# Patient Record
Sex: Male | Born: 1955
Health system: Southern US, Community
[De-identification: ages and names within clinical notes are randomized; demographics above are authoritative.]

## PROBLEM LIST (undated history)

## (undated) DIAGNOSIS — R7303 Prediabetes: Secondary | ICD-10-CM

## (undated) DIAGNOSIS — N210 Calculus in bladder: Secondary | ICD-10-CM

## (undated) DIAGNOSIS — E7800 Pure hypercholesterolemia, unspecified: Secondary | ICD-10-CM

## (undated) DIAGNOSIS — C801 Malignant (primary) neoplasm, unspecified: Secondary | ICD-10-CM

## (undated) DIAGNOSIS — F99 Mental disorder, not otherwise specified: Secondary | ICD-10-CM

## (undated) DIAGNOSIS — R32 Unspecified urinary incontinence: Secondary | ICD-10-CM

## (undated) DIAGNOSIS — F329 Major depressive disorder, single episode, unspecified: Secondary | ICD-10-CM

## (undated) DIAGNOSIS — F32A Depression, unspecified: Secondary | ICD-10-CM

## (undated) DIAGNOSIS — Z720 Tobacco use: Secondary | ICD-10-CM

## (undated) DIAGNOSIS — R0602 Shortness of breath: Secondary | ICD-10-CM

## (undated) DIAGNOSIS — I10 Essential (primary) hypertension: Secondary | ICD-10-CM

## (undated) DIAGNOSIS — F419 Anxiety disorder, unspecified: Secondary | ICD-10-CM

## (undated) DIAGNOSIS — J449 Chronic obstructive pulmonary disease, unspecified: Secondary | ICD-10-CM

## (undated) DIAGNOSIS — Z87442 Personal history of urinary calculi: Secondary | ICD-10-CM

## (undated) DIAGNOSIS — M1990 Unspecified osteoarthritis, unspecified site: Secondary | ICD-10-CM

## (undated) DIAGNOSIS — G4730 Sleep apnea, unspecified: Secondary | ICD-10-CM

## (undated) DIAGNOSIS — K219 Gastro-esophageal reflux disease without esophagitis: Secondary | ICD-10-CM

## (undated) DIAGNOSIS — D649 Anemia, unspecified: Secondary | ICD-10-CM

## (undated) DIAGNOSIS — R0989 Other specified symptoms and signs involving the circulatory and respiratory systems: Secondary | ICD-10-CM

## (undated) HISTORY — DX: Tobacco use: Z72.0

## (undated) HISTORY — PX: COLONOSCOPY: SHX174

## (undated) HISTORY — PX: BLADDER SUSPENSION: SHX72

## (undated) HISTORY — PX: CERVICAL FUSION: SHX112

## (undated) HISTORY — DX: Other specified symptoms and signs involving the circulatory and respiratory systems: R09.89

## (undated) HISTORY — DX: Chronic obstructive pulmonary disease, unspecified: J44.9

## (undated) HISTORY — PX: CARDIAC CATHETERIZATION: SHX172

## (undated) HISTORY — PX: WRIST SURGERY: SHX841

## (undated) HISTORY — PX: BACK SURGERY: SHX140

## (undated) HISTORY — PX: PROSTATE SURGERY: SHX751

---

## 2005-08-25 DIAGNOSIS — C801 Malignant (primary) neoplasm, unspecified: Secondary | ICD-10-CM

## 2005-08-25 HISTORY — DX: Malignant (primary) neoplasm, unspecified: C80.1

## 2005-09-29 ENCOUNTER — Ambulatory Visit (HOSPITAL_COMMUNITY): Admission: RE | Admit: 2005-09-29 | Discharge: 2005-09-29 | Payer: Self-pay | Admitting: General Surgery

## 2005-10-13 ENCOUNTER — Ambulatory Visit (HOSPITAL_COMMUNITY): Admission: RE | Admit: 2005-10-13 | Discharge: 2005-10-13 | Payer: Self-pay | Admitting: Urology

## 2005-10-29 ENCOUNTER — Encounter (HOSPITAL_COMMUNITY): Admission: RE | Admit: 2005-10-29 | Discharge: 2006-01-27 | Payer: Self-pay | Admitting: Urology

## 2005-12-17 ENCOUNTER — Encounter (INDEPENDENT_AMBULATORY_CARE_PROVIDER_SITE_OTHER): Payer: Self-pay | Admitting: Specialist

## 2005-12-18 ENCOUNTER — Inpatient Hospital Stay (HOSPITAL_COMMUNITY): Admission: RE | Admit: 2005-12-18 | Discharge: 2005-12-19 | Payer: Self-pay | Admitting: Urology

## 2006-01-09 ENCOUNTER — Ambulatory Visit (HOSPITAL_COMMUNITY): Admission: RE | Admit: 2006-01-09 | Discharge: 2006-01-09 | Payer: Self-pay | Admitting: Pulmonary Disease

## 2007-09-27 ENCOUNTER — Encounter (HOSPITAL_COMMUNITY): Admission: RE | Admit: 2007-09-27 | Discharge: 2007-10-27 | Payer: Self-pay | Admitting: Family Medicine

## 2007-12-01 ENCOUNTER — Ambulatory Visit (HOSPITAL_COMMUNITY): Admission: RE | Admit: 2007-12-01 | Discharge: 2007-12-01 | Payer: Self-pay | Admitting: Urology

## 2008-05-30 ENCOUNTER — Ambulatory Visit: Admission: RE | Admit: 2008-05-30 | Discharge: 2008-08-22 | Payer: Self-pay | Admitting: Radiation Oncology

## 2008-06-21 ENCOUNTER — Encounter: Admission: RE | Admit: 2008-06-21 | Discharge: 2008-06-21 | Payer: Self-pay | Admitting: Orthopedic Surgery

## 2008-08-16 DIAGNOSIS — R0989 Other specified symptoms and signs involving the circulatory and respiratory systems: Secondary | ICD-10-CM

## 2008-08-16 HISTORY — DX: Other specified symptoms and signs involving the circulatory and respiratory systems: R09.89

## 2008-08-16 HISTORY — PX: CARDIOVASCULAR STRESS TEST: SHX262

## 2008-08-16 HISTORY — PX: TRANSTHORACIC ECHOCARDIOGRAM: SHX275

## 2008-09-14 ENCOUNTER — Inpatient Hospital Stay (HOSPITAL_COMMUNITY): Admission: RE | Admit: 2008-09-14 | Discharge: 2008-09-15 | Payer: Self-pay | Admitting: Orthopedic Surgery

## 2008-10-25 ENCOUNTER — Ambulatory Visit: Admission: RE | Admit: 2008-10-25 | Discharge: 2008-12-22 | Payer: Self-pay | Admitting: Radiation Oncology

## 2008-12-19 ENCOUNTER — Ambulatory Visit (HOSPITAL_COMMUNITY): Admission: RE | Admit: 2008-12-19 | Discharge: 2008-12-19 | Payer: Self-pay | Admitting: Family Medicine

## 2008-12-25 ENCOUNTER — Ambulatory Visit: Admission: RE | Admit: 2008-12-25 | Discharge: 2009-01-16 | Payer: Self-pay | Admitting: Radiation Oncology

## 2009-02-22 ENCOUNTER — Encounter (HOSPITAL_COMMUNITY): Admission: RE | Admit: 2009-02-22 | Discharge: 2009-05-23 | Payer: Self-pay | Admitting: Radiation Oncology

## 2009-10-30 ENCOUNTER — Encounter (INDEPENDENT_AMBULATORY_CARE_PROVIDER_SITE_OTHER): Payer: Self-pay | Admitting: Urology

## 2009-10-30 ENCOUNTER — Ambulatory Visit (HOSPITAL_COMMUNITY): Admission: RE | Admit: 2009-10-30 | Discharge: 2009-10-31 | Payer: Self-pay | Admitting: Urology

## 2009-11-17 ENCOUNTER — Emergency Department (HOSPITAL_COMMUNITY): Admission: EM | Admit: 2009-11-17 | Discharge: 2009-11-17 | Payer: Self-pay | Admitting: Emergency Medicine

## 2009-11-20 ENCOUNTER — Inpatient Hospital Stay (HOSPITAL_COMMUNITY): Admission: RE | Admit: 2009-11-20 | Discharge: 2009-11-22 | Payer: Self-pay | Admitting: Urology

## 2010-01-17 ENCOUNTER — Inpatient Hospital Stay (HOSPITAL_COMMUNITY): Admission: RE | Admit: 2010-01-17 | Discharge: 2010-01-18 | Payer: Self-pay | Admitting: Orthopedic Surgery

## 2010-07-12 ENCOUNTER — Ambulatory Visit (HOSPITAL_COMMUNITY): Admission: RE | Admit: 2010-07-12 | Discharge: 2010-07-12 | Payer: Self-pay | Admitting: Urology

## 2010-09-15 ENCOUNTER — Encounter: Payer: Self-pay | Admitting: Urology

## 2010-11-05 LAB — CBC
HCT: 40.1 % (ref 39.0–52.0)
Hemoglobin: 13.8 g/dL (ref 13.0–17.0)
RBC: 4.1 MIL/uL — ABNORMAL LOW (ref 4.22–5.81)
WBC: 7.7 10*3/uL (ref 4.0–10.5)

## 2010-11-05 LAB — SURGICAL PCR SCREEN
MRSA, PCR: NEGATIVE
Staphylococcus aureus: NEGATIVE

## 2010-11-05 LAB — BASIC METABOLIC PANEL
GFR calc non Af Amer: >60 mL/min (ref >60)
Potassium: 4.7 mEq/L (ref 3.5–5.1)
Sodium: 141 mEq/L (ref 135–145)

## 2010-11-11 LAB — BASIC METABOLIC PANEL
CO2: 24 mEq/L (ref 19–32)
GFR calc non Af Amer: >60 mL/min (ref >60)
Glucose, Bld: 94 mg/dL (ref 70–99)
Potassium: 3.9 mEq/L (ref 3.5–5.1)
Sodium: 136 mEq/L (ref 135–145)

## 2010-11-11 LAB — CBC
HCT: 39.6 % (ref 39.0–52.0)
Hemoglobin: 13.7 g/dL (ref 13.0–17.0)
MCHC: 34.5 g/dL (ref 30.0–36.0)
RDW: 13.6 % (ref 11.5–15.5)

## 2010-11-17 LAB — BASIC METABOLIC PANEL
BUN: 12 mg/dL (ref 6–23)
BUN: 9 mg/dL (ref 6–23)
CO2: 26 mEq/L (ref 19–32)
CO2: 27 mEq/L (ref 19–32)
Calcium: 8.7 mg/dL (ref 8.4–10.5)
Calcium: 9.1 mg/dL (ref 8.4–10.5)
Chloride: 107 mEq/L (ref 96–112)
Chloride: 109 mEq/L (ref 96–112)
Creatinine, Ser: 0.92 mg/dL (ref 0.4–1.5)
GFR calc Af Amer: >60 mL/min (ref >60)
GFR calc Af Amer: >60 mL/min (ref >60)
GFR calc Af Amer: >60 mL/min (ref >60)
GFR calc non Af Amer: >60 mL/min (ref >60)
GFR calc non Af Amer: >60 mL/min (ref >60)
GFR calc non Af Amer: >60 mL/min (ref >60)
GFR calc non Af Amer: >60 mL/min (ref >60)
Glucose, Bld: 120 mg/dL — ABNORMAL HIGH (ref 70–99)
Glucose, Bld: 134 mg/dL — ABNORMAL HIGH (ref 70–99)
Glucose, Bld: 145 mg/dL — ABNORMAL HIGH (ref 70–99)
Glucose, Bld: 149 mg/dL — ABNORMAL HIGH (ref 70–99)
Potassium: 3.6 mEq/L (ref 3.5–5.1)
Potassium: 3.8 mEq/L (ref 3.5–5.1)
Potassium: 4 mEq/L (ref 3.5–5.1)
Potassium: 4.3 mEq/L (ref 3.5–5.1)
Sodium: 136 mEq/L (ref 135–145)
Sodium: 140 mEq/L (ref 135–145)
Sodium: 141 mEq/L (ref 135–145)
Sodium: 141 mEq/L (ref 135–145)

## 2010-11-17 LAB — CBC
HCT: 35.8 % — ABNORMAL LOW (ref 39.0–52.0)
HCT: 37.2 % — ABNORMAL LOW (ref 39.0–52.0)
HCT: 37.6 % — ABNORMAL LOW (ref 39.0–52.0)
HCT: 37.7 % — ABNORMAL LOW (ref 39.0–52.0)
HCT: 39.7 % (ref 39.0–52.0)
Hemoglobin: 11.7 g/dL — ABNORMAL LOW (ref 13.0–17.0)
Hemoglobin: 12.4 g/dL — ABNORMAL LOW (ref 13.0–17.0)
MCHC: 32.7 g/dL (ref 30.0–36.0)
MCHC: 32.8 g/dL (ref 30.0–36.0)
MCV: 99 fL (ref 78.0–100.0)
MCV: 99.1 fL (ref 78.0–100.0)
Platelets: 220 10*3/uL (ref 150–400)
Platelets: 230 10*3/uL (ref 150–400)
Platelets: 280 10*3/uL (ref 150–400)
Platelets: 281 10*3/uL (ref 150–400)
RBC: 3.81 MIL/uL — ABNORMAL LOW (ref 4.22–5.81)
RDW: 12.4 % (ref 11.5–15.5)
RDW: 12.6 % (ref 11.5–15.5)
RDW: 13 % (ref 11.5–15.5)
RDW: 13.2 % (ref 11.5–15.5)
WBC: 7.7 10*3/uL (ref 4.0–10.5)

## 2010-11-17 LAB — GENTAMICIN LEVEL, RANDOM: Gentamicin Rm: 3.7 ug/mL

## 2010-11-17 LAB — DIFFERENTIAL
Basophils Absolute: 0 10*3/uL (ref 0.0–0.1)
Basophils Absolute: 0 10*3/uL (ref 0.0–0.1)
Basophils Relative: 0 % (ref 0–1)
Eosinophils Absolute: 0.1 10*3/uL (ref 0.0–0.7)
Eosinophils Absolute: 0.5 10*3/uL (ref 0.0–0.7)
Eosinophils Relative: 1 % (ref 0–5)
Eosinophils Relative: 6 % — ABNORMAL HIGH (ref 0–5)
Lymphocytes Relative: 20 % (ref 12–46)
Lymphocytes Relative: 3 % — ABNORMAL LOW (ref 12–46)
Lymphs Abs: 1.5 10*3/uL (ref 0.7–4.0)
Neutrophils Relative %: 64 % (ref 43–77)

## 2010-11-17 LAB — PROTIME-INR: Prothrombin Time: 13.1 seconds (ref 11.6–15.2)

## 2010-11-17 LAB — TYPE AND SCREEN: Antibody Screen: NEGATIVE

## 2010-11-17 LAB — URINALYSIS, ROUTINE W REFLEX MICROSCOPIC
Nitrite: NEGATIVE
Specific Gravity, Urine: 1.02 (ref 1.005–1.030)
pH: 5.5 (ref 5.0–8.0)

## 2010-11-17 LAB — CULTURE, BLOOD (ROUTINE X 2)
Report Status: 3312011
Report Status: 3312011

## 2010-11-17 LAB — GRAM STAIN

## 2010-11-17 LAB — CULTURE, ROUTINE-ABSCESS

## 2010-11-17 LAB — URINE CULTURE: Colony Count: 65000

## 2010-11-17 LAB — ANAEROBIC CULTURE

## 2010-11-20 ENCOUNTER — Ambulatory Visit (HOSPITAL_COMMUNITY)
Admission: RE | Admit: 2010-11-20 | Discharge: 2010-11-20 | Disposition: A | Discharge status: Home / Self Care | Payer: PRIVATE HEALTH INSURANCE | Source: Ambulatory Visit | Attending: Cardiology | Admitting: Cardiology

## 2010-11-20 ENCOUNTER — Other Ambulatory Visit (HOSPITAL_COMMUNITY): Payer: Self-pay | Admitting: Cardiology

## 2010-11-20 DIAGNOSIS — Z01811 Encounter for preprocedural respiratory examination: Secondary | ICD-10-CM

## 2010-11-20 DIAGNOSIS — Z01818 Encounter for other preprocedural examination: Secondary | ICD-10-CM | POA: Insufficient documentation

## 2010-11-26 ENCOUNTER — Ambulatory Visit (HOSPITAL_COMMUNITY)
Admission: RE | Admit: 2010-11-26 | Discharge: 2010-11-26 | Disposition: A | Discharge status: Home / Self Care | Payer: PRIVATE HEALTH INSURANCE | Source: Ambulatory Visit | Attending: Cardiology | Admitting: Cardiology

## 2010-11-26 DIAGNOSIS — R0602 Shortness of breath: Secondary | ICD-10-CM | POA: Insufficient documentation

## 2010-11-26 DIAGNOSIS — E785 Hyperlipidemia, unspecified: Secondary | ICD-10-CM | POA: Insufficient documentation

## 2010-11-26 DIAGNOSIS — F17200 Nicotine dependence, unspecified, uncomplicated: Secondary | ICD-10-CM | POA: Insufficient documentation

## 2010-11-26 LAB — POCT I-STAT 3, VENOUS BLOOD GAS (G3P V)
Bicarbonate: 22.4 mEq/L (ref 20.0–24.0)
TCO2: 24 mmol/L (ref 0–100)
pCO2, Ven: 46.2 mmHg (ref 45.0–50.0)
pH, Ven: 7.303 — ABNORMAL HIGH (ref 7.250–7.300)
pH, Ven: 7.305 — ABNORMAL HIGH (ref 7.250–7.300)
pO2, Ven: 35 mmHg (ref 30.0–45.0)

## 2010-11-26 LAB — POCT I-STAT, CHEM 8
BUN: 15 mg/dL (ref 6–23)
Chloride: 106 mEq/L (ref 96–112)
Creatinine, Ser: 0.9 mg/dL (ref 0.4–1.5)
Glucose, Bld: 97 mg/dL (ref 70–99)
Potassium: 3.7 mEq/L (ref 3.5–5.1)

## 2010-11-26 LAB — POCT I-STAT 3, ART BLOOD GAS (G3+)
Acid-base deficit: 3 mmol/L — ABNORMAL HIGH (ref 0.0–2.0)
Bicarbonate: 22.6 mEq/L (ref 20.0–24.0)
O2 Saturation: 95 %
TCO2: 24 mmol/L (ref 0–100)
pO2, Arterial: 79 mmHg — ABNORMAL LOW (ref 80.0–100.0)

## 2010-12-09 LAB — TYPE AND SCREEN: ABO/RH(D): A POS

## 2010-12-09 LAB — CBC
HCT: 44.3 % (ref 39.0–52.0)
Platelets: 244 10*3/uL (ref 150–400)
RDW: 12.3 % (ref 11.5–15.5)

## 2010-12-09 LAB — POCT I-STAT 4, (NA,K, GLUC, HGB,HCT)
Glucose, Bld: 127 mg/dL — ABNORMAL HIGH (ref 70–99)
HCT: 36 % — ABNORMAL LOW (ref 39.0–52.0)
Hemoglobin: 12.2 g/dL — ABNORMAL LOW (ref 13.0–17.0)
Potassium: 4 mEq/L (ref 3.5–5.1)

## 2010-12-09 LAB — DIFFERENTIAL
Basophils Absolute: 0.1 10*3/uL (ref 0.0–0.1)
Eosinophils Absolute: 1 10*3/uL — ABNORMAL HIGH (ref 0.0–0.7)
Eosinophils Relative: 9 % — ABNORMAL HIGH (ref 0–5)
Lymphocytes Relative: 35 % (ref 12–46)

## 2010-12-09 LAB — BASIC METABOLIC PANEL WITH GFR
BUN: 11 mg/dL (ref 6–23)
CO2: 28 meq/L (ref 19–32)
Calcium: 10.3 mg/dL (ref 8.4–10.5)
Chloride: 102 meq/L (ref 96–112)
Creatinine, Ser: 0.81 mg/dL (ref 0.4–1.5)
GFR calc non Af Amer: >60 mL/min
Glucose, Bld: 93 mg/dL (ref 70–99)
Potassium: 4.6 meq/L (ref 3.5–5.1)
Sodium: 142 meq/L (ref 135–145)

## 2010-12-09 LAB — ABO/RH: ABO/RH(D): A POS

## 2010-12-16 NOTE — Consult Note (Signed)
NAMEAMARION, [name] NO.:  [id number]  MEDICAL RECORD NO.:  [id number]           PATIENT TYPE:  O  LOCATION:  [facility]                         FACILITY:  [facility]  PHYSICIAN:  Ritta Slot, MD     DATE OF BIRTH:  1955/08/31  DATE OF CONSULTATION:  11/26/2010 DATE OF DISCHARGE:  11/26/2010                                CONSULTATION   PATIENT PROFILE:  Mr. [name] is a very pleasant 55 year old Caucasian gentleman with a history of shortness of breath and a 40-pack-history of smoking with mild dyslipidemia.  He had a negative nuclear perfusion stress test, but was still complaining of shortness of breath.  We decided to go ahead and proceed with a right and left heart catheterization to exclude coronary artery disease and pulmonary hypertension as a cause the symptoms.  After informed consent, he was placed in supine position in the second floor of Agency cardiac catheterization laboratory.  His right groin was prepped and draped in usual sterile fashion.  He received 3 mg of Versed, 75 mcg of fentanyl, 20 mL of 1% lidocaine local anesthetic.  We placed a 5-French sheath in the RFA and a 7-French sheath in the RFV using the modified Seldinger technique.  Through the 7-French, we was placed a Swan-Ganz catheter that was placed into PA without difficulty.  Cardiac outputs were obtained by thermodilution as well as Fick principal measurement.  An angled pigtail catheter was then placed into the LV through the 5-French sheath without difficulty.  Simultaneous LV wedge pressures LVPA, LVRA, and isolated RA pressures were obtained.  Left ventriculography was then performed using 36 mL of contrast at 12 mL per second in the RAO oblique view.  Pressure across the aortic valve was then obtained using pullback technique.  The angled pigtail catheter was then exchanged over a regular J-wire for an JL-4 catheter.  This was placed in the left main without difficulty.   Visualization of the left circulation system was obtained with AP cranial, LAO cranial, RA cranial, AP caudal views.  The JL-4 catheter was then exchanged over J-wire for the JR-4 JR-4 catheter. The JR-4 catheter was placed in the RCA without difficulty. Visualization of the RCA was obtained in the RAO cranial, LAO cranial view.  FINDINGS: 1. RA pressure 7/7, mean of 6, RV pressure 29/0, LVEDP of 9.  PA     pressure 27/8 and mean at 7.  Wedge 12/14, mean of 10.  LV pressure     108/negative 1, LVEDP of 13.  LV pullback 106/negative 1, LVEDP of     7.  AO pressure pullback 95/53, mean of 70, AO sat 95%, PA sat 60%.     Cardiac output by thermo was 4.41, cardiac output by thermo 1.78,     cardiac output by Fick 5.35.  Cardiac index by Fick 2.15. 2. Coronary.  The right coronary artery was a nondominant vessel, did     give rise to PDA, but was small, was free of any disease.  The left     main bifurcated into the LAD and circumflex LAD and gave rise  to     diagonals that was free of any disease.  The circumflex was a very     large territory giving rise to the OM1, OM2, OM3, as well as a PDA.     There is no significant disease in the circ complex territory     artery either.  The left main was also free of any disease.  LV     function was found to be 65%.  No regional wall motion     abnormalities.  No mitral regurgitation.  FINAL CONCLUSIONS:  Normal right and left heart catheter with clean coronaries, normal LV function, and normal right heart pressures.  I think the symptoms are noncardiac or pulmonary in origin.  He will follow up with me in due course.     Ritta Slot, MD     HS/MEDQ  D:  11/26/2010  T:  11/27/2010  Job:  [id number]  Electronically Signed by Ritta Slot MD on 12/16/2010 04:04:47 PM
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
## 2011-01-07 NOTE — Op Note (Signed)
NAMEARSHAN, [name]                ACCOUNT NO.:  [id number]   MEDICAL RECORD NO.:  [id number]          PATIENT TYPE:  AMB   LOCATION:  DAY                          FACILITY:  [facility]   PHYSICIAN:  Ronald L. Earlene Plater, M.D.  DATE OF BIRTH:  08/13/1956   DATE OF PROCEDURE:  12/01/2007  DATE OF DISCHARGE:                               OPERATIVE REPORT   PREOPERATIVE DIAGNOSES:  1. Adenocarcinoma of prostate, status post robotic radical      prostatectomy.  2. Urinary incontinence.  3. Bladder neck stone.   POSTOPERATIVE DIAGNOSES:  1. Adenocarcinoma of prostate, status post robotic radical      prostatectomy.  2. Urinary incontinence.  3. Bladder neck stone.  4. Dorsal vein staple erosion resulting in stone.   PROCEDURE:  1. Cystourethroscopy.  2. Removal of bladder neck stone and eroded staples.   SURGEON:  Lucrezia Starch. Earlene Plater, M.D.   ANESTHESIA:  LMA.   BLOOD LOSS:  Negligible.   TUBES:  None.   COMPLICATIONS:  None.   PROCEDURE:  Mr. Cure is a very nice 55 year old white male who is  status post term robotic radical prostatectomy in April 2007.  He did  have positive margins.  He has developed some stress urinary  incontinence which has increased.  On cystourethroscopy he was found to  have a stone in the 8 o'clock position on the bladder neck.  After  understanding risks, benefits and alternatives, he elected to proceed  with removal.   DESCRIPTION OF PROCEDURE:  The patient was placed in supine position.  After proper LMA anesthesia, was placed in the dorsal lithotomy position  and prepped and draped with Betadine in the sterile fashion.  Cystourethroscopy with 22.5 [place] Olympus panendoscope.  The bladder  was carefully inspected and noted be without lesions and efflux of clear  urine was noted from normally placed ureteral orifices bilaterally.  At  the bladder neck area right at the urethrovesical anastomosis, there was  a  yellow calcification which was protruding in  the 8 o'clock position.  It was noted that the sphincter was totally intact and there was no  bladder neck contracture or urethral stricture.  Utilizing a 70 degrees  lens and an Aberrans bridge,  the stone was grasped and removed with a  grasping forceps.  Underlying it was a very small eroded staple that was  a staple from the dorsal vein complex that had obviously eroded and  migrated.  This was also grasped and removed and both the stone material  and the staple  were removed and will be given to him.  Reinspection revealed the  bladder neck was soft and supple.  The external sphincter was intact.  It was widely patent.  The bladder was without lesions and  ureteral  orifices were normal.  Bladder was drained.  The panendoscope was  removed and the patient was taken to the recovery room stable.      Ronald L. Earlene Plater, M.D.  Electronically Signed     RLD/MEDQ  D:  12/01/2007  T:  12/01/2007  Job:  [id number]
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
## 2011-01-10 NOTE — H&P (Signed)
NAME:  [name], [name] NO.:  [id number]   MEDICAL RECORD NO.:  [id number]           PATIENT TYPE:   LOCATION:                                 FACILITY:   PHYSICIAN:  Dalia Heading, M.D.  DATE OF BIRTH:  11-25-1955   DATE OF ADMISSION:  DATE OF DISCHARGE:  LH                                HISTORY & PHYSICAL   CHIEF COMPLAINT:  Need for screening colonoscopy.   HISTORY OF PRESENT ILLNESS:  The patient is a 55 year old white male who is  referred for endoscopic evaluation.  He needs a colonoscopy for screening  purposes.  No abdominal pain, weight loss, nausea, vomiting, diarrhea,  constipation, melena, or hematochezia have been noted.  He has never had a  colonoscopy.  There is no family history of colon carcinoma.   PAST MEDICAL HISTORY:  BPH.   PAST SURGICAL HISTORY:  Vasectomy.   CURRENT MEDICATIONS:  Flomax.   ALLERGIES:  PENICILLIN.   REVIEW OF SYSTEMS:  The patient smokes one pack of cigarettes a day.  Denies  alcohol use.  He denies any other cardiopulmonary difficulties or bleeding  disorders.   PHYSICAL EXAMINATION:  GENERAL:  The patient is a well-developed, well-  nourished, white male in no acute distress.  LUNGS:  Clear to auscultation with equal breath sounds bilaterally.  HEART:  Reveals a regular rate and rhythm without S3, S4, or murmurs.  ABDOMEN:  Soft, nontender, nondistended.  No hepatosplenomegaly or masses  are noted.  RECTAL:  Deferred to the procedure.   IMPRESSION:  Need for screening colonoscopy.   PLAN:  The patient is scheduled for a colonoscopy on September 29, 2005.  The  risks and benefits of the procedure including bleeding and perforation were  fully explained to the patient, gave informed consent.      Dalia Heading, M.D.  Electronically Signed     MAJ/MEDQ  D:  09/02/2005  T:  09/02/2005  Job:  [id number]   cc:   Dalia Heading, M.D.  Fax: [phone]   [facility]  Fax: [phone]   Patrica Duel, M.D.  Fax: [phone]

## 2011-01-10 NOTE — Op Note (Signed)
NAMEJOSAIAH, [name]                ACCOUNT NO.:  [id number]   MEDICAL RECORD NO.:  [id number]          PATIENT TYPE:  OIB   LOCATION:  1419                         FACILITY:  [facility]   PHYSICIAN:  Ronald L. Earlene Plater, M.D.  DATE OF BIRTH:  Aug 24, 1956   DATE OF PROCEDURE:  12/17/2005  DATE OF DISCHARGE:                                 OPERATIVE REPORT   PREOPERATIVE DIAGNOSIS:  Adenocarcinoma of the prostate.   POSTOPERATIVE DIAGNOSIS:  Adenocarcinoma of the prostate.   OPERATION PERFORMED:  Robotic radical prostatectomy with bilateral pelvic  lymphadenectomy and frozen sections of right pelvic lymph nodes.   SURGEON:  Lucrezia Starch. Earlene Plater, M.D.   ASSISTANT:  Crecencio Mc, M.D.   ANESTHESIA:  General endotracheal.   ESTIMATED BLOOD LOSS:  200 mL.   TUBES:  18 French Foley catheter, large round Blake drain.   COMPLICATIONS:  None.   INDICATIONS FOR PROCEDURE:  Mr. Mozer is a very nice 55 year old white male  who originally presented with an elevated PSA of 4.83 and a right prostate  nodule.  He underwent biopsy which revealed a Gleason score 7 which was 4+3  adenocarcinoma from the right side of the prostate in 60% of the tissue  biopsied and a Gleason score 6 which was 3+3 in 5% of the tissue from the  left side of the prostate.  He underwent a metastatic work-up and on CT scan  there were some shotty bilateral pelvic lymph nodes, the largest 1.1 x 2 cm  on the right side and a subsequent Prostascint scan revealed borderline  enlarged external iliac nodes with some tracer uptake on the right side.  We  discussed biopsy of the lymph nodes, diagnostic radiology and they felt like  the approach was not safe, so he elected to proceed with robotic pelvic  exploration and lymphadenopathy with frozen section from the right side of  the lymph node chain, if these were negative, to proceed with robotic  radical prostatectomy.   DESCRIPTION OF PROCEDURE:  The patient was placed in supine  position.  After  proper general endotracheal anesthesia, he was placed in the frogleg  position in exaggerated Trendelenburg and prepped and draped with Betadine  in sterile fashion.  A 22 French Foley catheter was inserted and the bladder  was drained.  A periumbilical incision was made and the 11-12 cannula was  placed and the peritoneal cavity was insufflated with carbon dioxide.  The  remainder of the ports were placed.  They were a robotic port to the right  and left, one hand breadth from the camera port and they were 8 mm ports.  Lateral to the left robotic port was another 8 mm robotic port for the third  arm and two working ports were placed at 5 mm just superior and right  lateral to the camera port and one posterior lateral 11-12 working port.  Inspection of the abdomen revealed no significant abnormalities.  He was  somewhat obese however.  Using the monopolar hook cautery in the right hand  and the [place] bipolar on the left hand, the bladder  was taken down to the  space of Retzius.  The right lymph nodes which were more problematic were  taken down and large lymph nodes were noted to be in the external iliac  chain.  These were carefully taken down to the level of the circumflex iliac  vein and both the iliac artery and iliac vein were carefully dissected and  protected.  Lymph nodes were submitted to pathology and frozen section  revealed there was no tumor.  The obturator lymph nodes were also removed  and the lymphatics clipped.  Obturator nerve was identified and protected  and frozen section performed on them were also negative.  A left pelvic  lymph node dissection was performed in a similar manner and it appeared to  be normal so no frozen section was performed.  The prostate was then  approached.  The endopelvic fascia was perforated bilaterally and the  prostate was dissected free.  Puboprostatic ligaments were partially incised  and the dorsal vein complex was  clipped with Endo GIA stapler.  The bladder  neck was then approached and sharply dissected down and the bladder neck was  preserved and the posterior area was dissected free and seminal vesicles and  ampulla vas deferens were taken down.  Their vasculature was taken down and  dissected free.  Denonvilliers's fascia was then taken posteriorly and the  proper plane anterior to the rectum was dissected free from the prostate.  The pedicles were then clipped with Hem-o-lok clips and serially cut.  The  neurovascular bundle tissue was dissected anteriorly and the veil tissue was  dissected posterolaterally and maintained intact. Even on the right side it  appeared that it was safe to preserve the nerves and there was no induration  of the nerve tissue.  This was taken to the apex carefully.  The rectum was  dissected free.  The urethra was incised anteriorly.  The catheter was  removed.  The posterior urethral plane was taken down and the specimen was  removed intact and placed in the peritoneal cavity.  The pelvis was filled  with irrigation fluid.  Air was pumped into the rectum and there was no  leakage noted.  The neurovascular bundles appeared to be intact.  There was  no remaining prostate tissue.  Good hemostasis was obtained.  The  anastomosis was then approached.  The holding stitch was placed in the 6  o'clock position with 2-0 Vicryl suture to approximate the bladder neck  mucosa to the urethra.  The anastomosis was then performed with running 3-0  Monocryl suture, dyed on the left side and undyed to the right.  Stitches  were begun posteriorly and a running anastomosis was performed bilaterally  that was felt to be watertight mucosa.  The mucosa was directly visualized.  The transition stitch performed on to the bladder neck and these were tied  down.  An 40 French coude catheter was easily passed into the bladder.  The  bladder was noted to irrigate clear and good hemostasis was  noted to be present.  A large round Blake drain was placed through the third port and  noted to be in the pelvis.  Thorough irrigation was performed.  The right 11-  12 port was closed visually with a suture passer and each port was  visualized when the cannulas were removed and there was no significant  bleeding noted.  The midline wound was then closed with a running 2-0 Vicryl  suture utilizing UR-6 needle  and the fascia was noted to be well closed.  Thorough irrigation was performed of each wound.  They were injected with  Marcaine and the skin was closed with skin staples and the wound was dressed  in a sterile fashion and the drain was sutured in place with nylon.  The  patient tolerated the procedure well.  There were no complications.  He was  taken to recovery room stable.      Ronald L. Earlene Plater, M.D.  Electronically Signed     RLD/MEDQ  D:  12/17/2005  T:  12/18/2005  Job:  [id number]

## 2011-01-10 NOTE — Procedures (Signed)
[name], [name]                ACCOUNT NO.:  [id number]   MEDICAL RECORD NO.:  [id number]          PATIENT TYPE:  OUT   LOCATION:                                FACILITY:  [facility]   PHYSICIAN:  Edward L. Juanetta Gosling, M.D.DATE OF BIRTH:  [birth year]   DATE OF PROCEDURE:  DATE OF DISCHARGE:                              PULMONARY FUNCTION TEST   1.  Spirometry shows no definite ventilatory defect, but evidence of airflow      obstruction most marked in the smaller airways.  2.  Lung volumes are normal.  3.  DLCO is normal.  4.  Arterial blood gases are normal.  5.  There is no significant bronchodilator effect.      Edward L. Juanetta Gosling, M.D.  Electronically Signed     ELH/MEDQ  D:  01/14/2006  T:  01/14/2006  Job:  [id number]

## 2011-05-20 LAB — BASIC METABOLIC PANEL
BUN: 8
CO2: 26
Chloride: 107
Glucose, Bld: 107 — ABNORMAL HIGH
Potassium: 3.8

## 2011-09-26 ENCOUNTER — Other Ambulatory Visit: Payer: Self-pay | Admitting: Orthopedic Surgery

## 2011-09-26 DIAGNOSIS — M549 Dorsalgia, unspecified: Secondary | ICD-10-CM

## 2011-09-30 ENCOUNTER — Ambulatory Visit
Admission: RE | Admit: 2011-09-30 | Discharge: 2011-09-30 | Disposition: A | Discharge status: Home / Self Care | Payer: PRIVATE HEALTH INSURANCE | Source: Ambulatory Visit | Attending: Orthopedic Surgery | Admitting: Orthopedic Surgery

## 2011-09-30 DIAGNOSIS — M549 Dorsalgia, unspecified: Secondary | ICD-10-CM

## 2011-09-30 MED ORDER — OXYCODONE-ACETAMINOPHEN 5-325 MG PO TABS
2.0000 | ORAL_TABLET | Freq: Once | ORAL | Status: AC
  Administered 2011-09-30: 2 via ORAL

## 2011-09-30 MED ORDER — DIAZEPAM 5 MG PO TABS
10.0000 mg | ORAL_TABLET | Freq: Once | ORAL | Status: AC
  Administered 2011-09-30: 10 mg via ORAL

## 2011-09-30 MED ORDER — ONDANSETRON HCL 4 MG/2ML IJ SOLN: 4.0000 mg | Freq: Four times a day (QID) | INTRAMUSCULAR | Status: DC | PRN

## 2011-09-30 MED ORDER — IOHEXOL 180 MG/ML  SOLN
15.0000 mL | Freq: Once | INTRAMUSCULAR | Status: AC | PRN
  Administered 2011-09-30: 15 mL via INTRATHECAL

## 2011-09-30 NOTE — Progress Notes (Signed)
Explained discharge instructions, consent signed and valium given. Pt has been off abilify and prozac for the last 2 days.

## 2012-11-01 DIAGNOSIS — M549 Dorsalgia, unspecified: Secondary | ICD-10-CM | POA: Diagnosis present

## 2012-11-01 NOTE — H&P (Signed)
History of Present Illness The patient is a 57 year old male who presents today for follow up of their back. The patient is being followed for their central back pain. The patient reports their current pain level to be 6.5 / 10. The patient presents today following MRI.  PRIMARY CARE PHYSICIAN:  [name], MD  HISTORY OF PRESENT ILLNESS:  [name] returns today with ongoing severe debilitating back pain and buttock pain, and bilateral leg pain. He continues to have poor quality of life despite doing the pain medical management and activity modification and medications. The prostate cancer remains to be an active issue, but it has not metastasized though. His overall poor quality of life is largely due to the fact that the patient is now 4 years out from a two level lumbar spinal fusion which he actually did well from. At this point unfortunately the adjacent segment is beginning to show signs of degeneration. Despite the conservative management, the adjacent segment problem has become a greater issue.  The patient had a CT myelogram done February 2013 which showed severe L3-4 spinal stenosis, bilateral recessed foraminal stenosis and a slight anterior listhesis. Less prominent disease at 2-3 and 1-2. He had a completed L4-5 fusion. No hardware complications or recurrent stenosis and there was bone fixation in the posterolateral gutter, minimal bridging bone within the disk space, but nonetheless was still stable fusion at L5-S1. The patient had a recent MRI done on 09/27/12 which showed similar findings to his CT myelogram. The L3 cord abnormality continues. It seems to be somewhat more prominent from his 12/30/11 MRI with increased lateral recessed disease. At L2-3 there is a diffuse abundance of epidural fat which constricts the fecal sac similar to a previous study. L4-5 and 5-1 is stable. No neural encroachment.  Allergies PENICILLIN   Social History Tobacco use.  current every day smoker; smoke(d) 1 pack(s) per day   Medication History Norco (5-325MG  Tablet, 1 (one) Tablet Oral every 8 hours as needed for pain, Taken starting 09/17/2012) Active. Trilipix ( Oral) Specific dose unknown - Active. Vasotec ( Oral) Specific dose unknown - Active. Zocor ( Oral) Specific dose unknown - Active.   Objective Transcription  He is a pleasant gentleman who appears younger than his stated age. He is alert and oriented x3. No shortness of breath or chest pain. Abdomen is soft and nontender. Lungs are clear to auscultation. Heart: Regular rate and rhythm. Intact peripheral pulses in the upper and lower extremity. He has back pain with palpation, range of motion, lateral bending. He has a well healed surgical scar from his previous 4-5, 5-1 fusion. He has diffuse dysesthesias. He has diffuse right sided dysesthesias. He does have some difficulty maintaining his balance due to his back pain, but his motor exam is grossly intact in the lower extremities.  Assessments Transcription  At this point in time as we were planning before his prostate cancer became an issue, he would like to proceed with a spinal fusion. His problem is the L3-4 adjacent segment disease.  Because of the instability, I favor doing XLIF. This would allow [place] to restore the disk height, address the anterior instability. I would then turn him over and place pedicle screws at L3 and then either remove the previous rod and place a new rod or use a connecting rod to tie into his existing fusion. I would also go to the L2-3 space and do a small fenestrated decompression to remove the constricting epidural fat. I have  explained this to the patient and his wife. They have expressed an understanding of the surgery and we will proceed in a timely fashion once we have medical clearance. The risks as I have explained them to him include infection, bleeding, nerve damage, death, stroke, paralysis,  failure to heal, need for further surgery, ongoing or worse pain, loss of bowel and bladder control, hardware complications, injury to the lumbar plexus that can result in temporary or permanent weakness of the hip flexors and difficulty walking because of that, and need for further surgery. In addition since this is a revision fusion operation, I am going to recommend that we again use the external bone stimulator post surgery.

## 2012-11-02 ENCOUNTER — Encounter (HOSPITAL_COMMUNITY): Payer: Self-pay

## 2012-11-09 NOTE — Pre-Procedure Instructions (Signed)
[name]  11/09/2012   Your procedure is scheduled on:  Wednesday November 17, 2012  Report to [facility] at 6:30 AM.  Call this number if you have problems the morning of surgery: [phone]   Remember:   Do not eat food or drink liquids after midnight.   Take these medicines the morning of surgery with A SIP OF WATER: Fluoxetine, Gabapentin (Neurontin), Hydrocodone (Vicodin) if needed for pain   Do not wear jewelry,   Do not wear lotions or colognes.    Men may shave face and neck.  Do not bring valuables to the hospital.  Contacts, dentures or bridgework may not be worn into surgery.  Leave suitcase in the car. After surgery it may be brought to your room.  For patients admitted to the hospital, checkout time is 11:00 AM the day of  discharge.   Patients discharged the day of surgery will not be allowed to drive  home.  Name and phone number of your driver: Family/ Friend  Special Instructions: Shower using CHG 2 nights before surgery and the night before surgery.  If you shower the day of surgery use CHG.  Use special wash - you have one bottle of CHG for all showers.  You should use approximately 1/3 of the bottle for each shower.   Please read over the following fact sheets that you were given: Pain Booklet, Coughing and Deep Breathing, Blood Transfusion Information, MRSA Information and Surgical Site Infection Prevention

## 2012-11-10 ENCOUNTER — Encounter (HOSPITAL_COMMUNITY)
Admission: RE | Admit: 2012-11-10 | Discharge: 2012-11-10 | Disposition: A | Discharge status: Home / Self Care | Payer: Medicare Other | Source: Ambulatory Visit | Attending: Orthopedic Surgery | Admitting: Orthopedic Surgery

## 2012-11-10 ENCOUNTER — Encounter (HOSPITAL_COMMUNITY): Payer: Self-pay

## 2012-11-10 HISTORY — DX: Shortness of breath: R06.02

## 2012-11-10 HISTORY — DX: Unspecified osteoarthritis, unspecified site: M19.90

## 2012-11-10 HISTORY — DX: Mental disorder, not otherwise specified: F99

## 2012-11-10 HISTORY — DX: Malignant (primary) neoplasm, unspecified: C80.1

## 2012-11-10 HISTORY — DX: Pure hypercholesterolemia, unspecified: E78.00

## 2012-11-10 HISTORY — DX: Depression, unspecified: F32.A

## 2012-11-10 HISTORY — DX: Unspecified urinary incontinence: R32

## 2012-11-10 HISTORY — DX: Sleep apnea, unspecified: G47.30

## 2012-11-10 HISTORY — DX: Anxiety disorder, unspecified: F41.9

## 2012-11-10 HISTORY — DX: Essential (primary) hypertension: I10

## 2012-11-10 HISTORY — DX: Major depressive disorder, single episode, unspecified: F32.9

## 2012-11-10 LAB — CBC
MCH: 32.8 pg (ref 26.0–34.0)
MCV: 95.3 fL (ref 78.0–100.0)
Platelets: 268 10*3/uL (ref 150–400)
RBC: 4.05 MIL/uL — ABNORMAL LOW (ref 4.22–5.81)
RDW: 13 % (ref 11.5–15.5)

## 2012-11-10 LAB — BASIC METABOLIC PANEL
CO2: 27 mEq/L (ref 19–32)
Calcium: 9.6 mg/dL (ref 8.4–10.5)
Creatinine, Ser: 0.85 mg/dL (ref 0.50–1.35)
GFR calc non Af Amer: >90 mL/min (ref >90)
Glucose, Bld: 101 mg/dL — ABNORMAL HIGH (ref 70–99)
Sodium: 138 mEq/L (ref 135–145)

## 2012-11-10 LAB — SURGICAL PCR SCREEN: Staphylococcus aureus: NEGATIVE

## 2012-11-10 NOTE — Progress Notes (Signed)
Patient informed Nurse that he had a stress test done within the last five years at [facility] with Dr. Ritta Slot, patient also had a cardiac cath but no PCI. Will request stress test results from [facility]. Pt has sleep apnea and wears CPAP nightly. PCP is Dr. Sherwood Gambler at [facility].

## 2012-11-11 NOTE — Progress Notes (Signed)
Received ekg/stress test/notes from [facility] and vascular. Called to request cath and echo. Sherry from the office stated there is no cath on file, but will send echo.

## 2012-11-12 NOTE — Progress Notes (Signed)
Received fax from [facility].  On chart.

## 2012-11-16 MED ORDER — ACETAMINOPHEN 10 MG/ML IV SOLN
1000.0000 mg | Freq: Once | INTRAVENOUS | Status: AC
  Administered 2012-11-17: 1000 mg via INTRAVENOUS
  Filled 2012-11-16: qty 100

## 2012-11-16 MED ORDER — VANCOMYCIN HCL IN DEXTROSE 1-5 GM/200ML-% IV SOLN: 1000.0000 mg | Freq: Once | INTRAVENOUS | Status: DC

## 2012-11-16 MED ORDER — DEXAMETHASONE SODIUM PHOSPHATE 4 MG/ML IJ SOLN
4.0000 mg | Freq: Once | INTRAMUSCULAR | Status: AC
  Administered 2012-11-17: 10 mg via INTRAVENOUS
  Filled 2012-11-16: qty 1

## 2012-11-16 MED ORDER — VANCOMYCIN HCL 10 G IV SOLR
1500.0000 mg | INTRAVENOUS | Status: DC
  Filled 2012-11-16 (×2): qty 1500

## 2012-11-17 ENCOUNTER — Encounter (HOSPITAL_COMMUNITY): Payer: Self-pay | Admitting: Anesthesiology

## 2012-11-17 ENCOUNTER — Inpatient Hospital Stay (HOSPITAL_COMMUNITY): Payer: Medicare Other

## 2012-11-17 ENCOUNTER — Inpatient Hospital Stay (HOSPITAL_COMMUNITY)
Admission: RE | Admit: 2012-11-17 | Discharge: 2012-11-19 | DRG: 460 | Disposition: A | Discharge status: Home / Self Care | Payer: Medicare Other | Source: Ambulatory Visit | Attending: Orthopedic Surgery | Admitting: Orthopedic Surgery

## 2012-11-17 ENCOUNTER — Encounter (HOSPITAL_COMMUNITY): Payer: Self-pay | Admitting: *Deleted

## 2012-11-17 ENCOUNTER — Inpatient Hospital Stay (HOSPITAL_COMMUNITY): Payer: Medicare Other | Admitting: Anesthesiology

## 2012-11-17 ENCOUNTER — Encounter (HOSPITAL_COMMUNITY)
Admission: RE | Disposition: A | Discharge status: Home / Self Care | Payer: Self-pay | Source: Ambulatory Visit | Attending: Orthopedic Surgery

## 2012-11-17 DIAGNOSIS — M5137 Other intervertebral disc degeneration, lumbosacral region: Principal | ICD-10-CM | POA: Diagnosis present

## 2012-11-17 DIAGNOSIS — R32 Unspecified urinary incontinence: Secondary | ICD-10-CM | POA: Diagnosis present

## 2012-11-17 DIAGNOSIS — F3289 Other specified depressive episodes: Secondary | ICD-10-CM | POA: Diagnosis present

## 2012-11-17 DIAGNOSIS — G4730 Sleep apnea, unspecified: Secondary | ICD-10-CM | POA: Diagnosis present

## 2012-11-17 DIAGNOSIS — D1779 Benign lipomatous neoplasm of other sites: Secondary | ICD-10-CM | POA: Diagnosis present

## 2012-11-17 DIAGNOSIS — F17200 Nicotine dependence, unspecified, uncomplicated: Secondary | ICD-10-CM | POA: Diagnosis present

## 2012-11-17 DIAGNOSIS — Z01810 Encounter for preprocedural cardiovascular examination: Secondary | ICD-10-CM

## 2012-11-17 DIAGNOSIS — M129 Arthropathy, unspecified: Secondary | ICD-10-CM | POA: Diagnosis present

## 2012-11-17 DIAGNOSIS — Z01812 Encounter for preprocedural laboratory examination: Secondary | ICD-10-CM

## 2012-11-17 DIAGNOSIS — E7800 Pure hypercholesterolemia, unspecified: Secondary | ICD-10-CM | POA: Diagnosis present

## 2012-11-17 DIAGNOSIS — M51379 Other intervertebral disc degeneration, lumbosacral region without mention of lumbar back pain or lower extremity pain: Principal | ICD-10-CM | POA: Diagnosis present

## 2012-11-17 DIAGNOSIS — C61 Malignant neoplasm of prostate: Secondary | ICD-10-CM | POA: Diagnosis present

## 2012-11-17 DIAGNOSIS — M549 Dorsalgia, unspecified: Secondary | ICD-10-CM | POA: Diagnosis present

## 2012-11-17 DIAGNOSIS — F411 Generalized anxiety disorder: Secondary | ICD-10-CM | POA: Diagnosis present

## 2012-11-17 DIAGNOSIS — I10 Essential (primary) hypertension: Secondary | ICD-10-CM | POA: Diagnosis present

## 2012-11-17 DIAGNOSIS — F329 Major depressive disorder, single episode, unspecified: Secondary | ICD-10-CM | POA: Diagnosis present

## 2012-11-17 DIAGNOSIS — Z01818 Encounter for other preprocedural examination: Secondary | ICD-10-CM

## 2012-11-17 HISTORY — PX: ANTERIOR LAT LUMBAR FUSION: SHX1168

## 2012-11-17 LAB — POCT I-STAT 4, (NA,K, GLUC, HGB,HCT)
Glucose, Bld: 135 mg/dL — ABNORMAL HIGH (ref 70–99)
HCT: 37 % — ABNORMAL LOW (ref 39.0–52.0)
Potassium: 4.3 mEq/L (ref 3.5–5.1)

## 2012-11-17 SURGERY — ANTERIOR LATERAL LUMBAR FUSION 1 LEVEL
Anesthesia: General | Site: Spine Lumbar | Wound class: Clean

## 2012-11-17 MED ORDER — ACETAMINOPHEN 10 MG/ML IV SOLN
1000.0000 mg | Freq: Four times a day (QID) | INTRAVENOUS | Status: AC
  Administered 2012-11-17 – 2012-11-18 (×4): 1000 mg via INTRAVENOUS
  Filled 2012-11-17 (×5): qty 100

## 2012-11-17 MED ORDER — ACETAMINOPHEN 10 MG/ML IV SOLN
INTRAVENOUS | Status: AC
  Filled 2012-11-17: qty 100

## 2012-11-17 MED ORDER — PROPOFOL 10 MG/ML IV BOLUS
INTRAVENOUS | Status: DC | PRN
  Administered 2012-11-17: 200 mg via INTRAVENOUS

## 2012-11-17 MED ORDER — DIPHENHYDRAMINE HCL 12.5 MG/5ML PO ELIX: 12.5000 mg | ORAL_SOLUTION | Freq: Four times a day (QID) | ORAL | Status: DC | PRN

## 2012-11-17 MED ORDER — SODIUM CHLORIDE 0.9 % IV SOLN
1500.0000 mg | Freq: Once | INTRAVENOUS | Status: AC
  Administered 2012-11-17: 1500 mg via INTRAVENOUS

## 2012-11-17 MED ORDER — ZOLPIDEM TARTRATE 5 MG PO TABS: 5.0000 mg | ORAL_TABLET | Freq: Every evening | ORAL | Status: DC | PRN

## 2012-11-17 MED ORDER — OXYCODONE HCL 5 MG PO TABS
10.0000 mg | ORAL_TABLET | ORAL | Status: DC | PRN
  Administered 2012-11-18 – 2012-11-19 (×4): 10 mg via ORAL
  Filled 2012-11-17 (×4): qty 2

## 2012-11-17 MED ORDER — SODIUM CHLORIDE 0.9 % IJ SOLN: 9.0000 mL | INTRAMUSCULAR | Status: DC | PRN

## 2012-11-17 MED ORDER — HYDROMORPHONE HCL PF 1 MG/ML IJ SOLN
INTRAMUSCULAR | Status: AC
  Administered 2012-11-17: 0.5 mg via INTRAVENOUS
  Filled 2012-11-17: qty 1

## 2012-11-17 MED ORDER — SODIUM CHLORIDE 0.9 % IJ SOLN: 3.0000 mL | Freq: Two times a day (BID) | INTRAMUSCULAR | Status: DC

## 2012-11-17 MED ORDER — MAGNESIUM CITRATE PO SOLN
1.0000 | Freq: Once | ORAL | Status: AC | PRN
  Filled 2012-11-17: qty 296

## 2012-11-17 MED ORDER — SUCCINYLCHOLINE CHLORIDE 20 MG/ML IJ SOLN
INTRAMUSCULAR | Status: DC | PRN
  Administered 2012-11-17: 100 mg via INTRAVENOUS

## 2012-11-17 MED ORDER — LACTATED RINGERS IV SOLN
INTRAVENOUS | Status: DC
  Administered 2012-11-17 – 2012-11-18 (×2): via INTRAVENOUS

## 2012-11-17 MED ORDER — POLYETHYLENE GLYCOL 3350 17 G PO PACK
17.0000 g | PACK | Freq: Every day | ORAL | Status: DC
  Administered 2012-11-18: 17 g via ORAL
  Filled 2012-11-17 (×3): qty 1

## 2012-11-17 MED ORDER — PNEUMOCOCCAL VAC POLYVALENT 25 MCG/0.5ML IJ INJ
0.5000 mL | INJECTION | INTRAMUSCULAR | Status: AC
  Administered 2012-11-18: 0.5 mL via INTRAMUSCULAR
  Filled 2012-11-17: qty 0.5

## 2012-11-17 MED ORDER — DOCUSATE SODIUM 100 MG PO CAPS
100.0000 mg | ORAL_CAPSULE | Freq: Two times a day (BID) | ORAL | Status: DC
  Administered 2012-11-17 – 2012-11-18 (×3): 100 mg via ORAL
  Filled 2012-11-17 (×4): qty 1

## 2012-11-17 MED ORDER — FLUOXETINE HCL 20 MG PO CAPS
60.0000 mg | ORAL_CAPSULE | Freq: Every day | ORAL | Status: DC
  Administered 2012-11-17 – 2012-11-18 (×2): 60 mg via ORAL
  Filled 2012-11-17 (×3): qty 3

## 2012-11-17 MED ORDER — MORPHINE SULFATE (PF) 1 MG/ML IV SOLN
INTRAVENOUS | Status: DC
  Administered 2012-11-17: 7.5 mg via INTRAVENOUS
  Administered 2012-11-18 (×2): 9 mg via INTRAVENOUS
  Administered 2012-11-18: 6.89 mg via INTRAVENOUS
  Filled 2012-11-17: qty 25

## 2012-11-17 MED ORDER — SODIUM CHLORIDE 0.9 % IV SOLN: 250.0000 mL | INTRAVENOUS | Status: DC

## 2012-11-17 MED ORDER — METHOCARBAMOL 100 MG/ML IJ SOLN
500.0000 mg | Freq: Four times a day (QID) | INTRAVENOUS | Status: DC | PRN
  Filled 2012-11-17: qty 5

## 2012-11-17 MED ORDER — VANCOMYCIN HCL IN DEXTROSE 1-5 GM/200ML-% IV SOLN
1000.0000 mg | Freq: Two times a day (BID) | INTRAVENOUS | Status: DC
  Administered 2012-11-17 – 2012-11-19 (×4): 1000 mg via INTRAVENOUS
  Filled 2012-11-17 (×5): qty 200

## 2012-11-17 MED ORDER — ENALAPRIL MALEATE 10 MG PO TABS
10.0000 mg | ORAL_TABLET | Freq: Every day | ORAL | Status: DC
  Administered 2012-11-17 – 2012-11-18 (×2): 10 mg via ORAL
  Filled 2012-11-17 (×3): qty 1

## 2012-11-17 MED ORDER — THROMBIN 20000 UNITS EX SOLR
CUTANEOUS | Status: DC | PRN
  Administered 2012-11-17: 10:00:00 via TOPICAL

## 2012-11-17 MED ORDER — DOXEPIN HCL 6 MG PO TABS: 6.0000 mg | ORAL_TABLET | Freq: Every day | ORAL | Status: DC

## 2012-11-17 MED ORDER — OXYCODONE HCL 5 MG PO TABS: 5.0000 mg | ORAL_TABLET | Freq: Once | ORAL | Status: DC | PRN

## 2012-11-17 MED ORDER — HEMOSTATIC AGENTS (NO CHARGE) OPTIME
TOPICAL | Status: DC | PRN
  Administered 2012-11-17: 1 via TOPICAL

## 2012-11-17 MED ORDER — MORPHINE SULFATE (PF) 1 MG/ML IV SOLN
INTRAVENOUS | Status: AC
  Filled 2012-11-17: qty 25

## 2012-11-17 MED ORDER — FENTANYL CITRATE 0.05 MG/ML IJ SOLN
INTRAMUSCULAR | Status: DC | PRN
  Administered 2012-11-17: 100 ug via INTRAVENOUS
  Administered 2012-11-17 (×10): 50 ug via INTRAVENOUS
  Administered 2012-11-17: 100 ug via INTRAVENOUS

## 2012-11-17 MED ORDER — ARIPIPRAZOLE 5 MG PO TABS
5.0000 mg | ORAL_TABLET | Freq: Every day | ORAL | Status: DC
  Administered 2012-11-17 – 2012-11-18 (×2): 5 mg via ORAL
  Filled 2012-11-17 (×3): qty 1

## 2012-11-17 MED ORDER — ONDANSETRON HCL 4 MG/2ML IJ SOLN
INTRAMUSCULAR | Status: DC | PRN
  Administered 2012-11-17: 4 mg via INTRAVENOUS

## 2012-11-17 MED ORDER — GLYCOPYRROLATE 0.2 MG/ML IJ SOLN
INTRAMUSCULAR | Status: DC | PRN
  Administered 2012-11-17: 0.2 mg via INTRAVENOUS

## 2012-11-17 MED ORDER — METHOCARBAMOL 500 MG PO TABS
500.0000 mg | ORAL_TABLET | Freq: Four times a day (QID) | ORAL | Status: DC | PRN
  Administered 2012-11-17 – 2012-11-19 (×5): 500 mg via ORAL
  Filled 2012-11-17 (×5): qty 1

## 2012-11-17 MED ORDER — VANCOMYCIN HCL 1000 MG IV SOLR
INTRAVENOUS | Status: DC | PRN
  Administered 2012-11-17: 1000 mg

## 2012-11-17 MED ORDER — SODIUM CHLORIDE 0.9 % IJ SOLN: 3.0000 mL | INTRAMUSCULAR | Status: DC | PRN

## 2012-11-17 MED ORDER — PHENYLEPHRINE HCL 10 MG/ML IJ SOLN
10.0000 mg | INTRAVENOUS | Status: DC | PRN
  Administered 2012-11-17: 15 ug/min via INTRAVENOUS

## 2012-11-17 MED ORDER — HYDROMORPHONE HCL PF 1 MG/ML IJ SOLN: 0.2500 mg | INTRAMUSCULAR | Status: DC | PRN

## 2012-11-17 MED ORDER — MENTHOL 3 MG MT LOZG: 1.0000 | LOZENGE | OROMUCOSAL | Status: DC | PRN

## 2012-11-17 MED ORDER — ONDANSETRON HCL 4 MG/2ML IJ SOLN: 4.0000 mg | INTRAMUSCULAR | Status: DC | PRN

## 2012-11-17 MED ORDER — MORPHINE SULFATE 2 MG/ML IJ SOLN: 1.0000 mg | INTRAMUSCULAR | Status: DC | PRN

## 2012-11-17 MED ORDER — DIPHENHYDRAMINE HCL 50 MG/ML IJ SOLN: 12.5000 mg | Freq: Four times a day (QID) | INTRAMUSCULAR | Status: DC | PRN

## 2012-11-17 MED ORDER — LACTATED RINGERS IV SOLN
INTRAVENOUS | Status: DC | PRN
  Administered 2012-11-17 (×5): via INTRAVENOUS

## 2012-11-17 MED ORDER — PHENOL 1.4 % MT LIQD: 1.0000 | OROMUCOSAL | Status: DC | PRN

## 2012-11-17 MED ORDER — GABAPENTIN 300 MG PO CAPS
600.0000 mg | ORAL_CAPSULE | Freq: Two times a day (BID) | ORAL | Status: DC
  Administered 2012-11-17 – 2012-11-18 (×3): 600 mg via ORAL
  Filled 2012-11-17 (×5): qty 2

## 2012-11-17 MED ORDER — OXYCODONE HCL 5 MG/5ML PO SOLN: 5.0000 mg | Freq: Once | ORAL | Status: DC | PRN

## 2012-11-17 MED ORDER — BUPIVACAINE-EPINEPHRINE 0.25% -1:200000 IJ SOLN
INTRAMUSCULAR | Status: DC | PRN
  Administered 2012-11-17: 10 mL

## 2012-11-17 MED ORDER — MIDAZOLAM HCL 5 MG/5ML IJ SOLN
INTRAMUSCULAR | Status: DC | PRN
  Administered 2012-11-17: 2 mg via INTRAVENOUS

## 2012-11-17 MED ORDER — ONDANSETRON HCL 4 MG/2ML IJ SOLN: 4.0000 mg | Freq: Once | INTRAMUSCULAR | Status: DC | PRN

## 2012-11-17 MED ORDER — VANCOMYCIN HCL 1000 MG IV SOLR
INTRAVENOUS | Status: AC
  Filled 2012-11-17: qty 1000

## 2012-11-17 MED ORDER — DEXAMETHASONE SODIUM PHOSPHATE 4 MG/ML IJ SOLN
4.0000 mg | Freq: Four times a day (QID) | INTRAMUSCULAR | Status: DC
  Administered 2012-11-17 – 2012-11-18 (×2): 4 mg via INTRAVENOUS
  Filled 2012-11-17 (×11): qty 1

## 2012-11-17 MED ORDER — THROMBIN 20000 UNITS EX SOLR
CUTANEOUS | Status: AC
  Filled 2012-11-17: qty 20000

## 2012-11-17 MED ORDER — LIDOCAINE HCL (CARDIAC) 20 MG/ML IV SOLN
INTRAVENOUS | Status: DC | PRN
  Administered 2012-11-17: 100 mg via INTRAVENOUS

## 2012-11-17 MED ORDER — ONDANSETRON HCL 4 MG/2ML IJ SOLN: 4.0000 mg | Freq: Four times a day (QID) | INTRAMUSCULAR | Status: DC | PRN

## 2012-11-17 MED ORDER — DEXAMETHASONE 4 MG PO TABS
4.0000 mg | ORAL_TABLET | Freq: Four times a day (QID) | ORAL | Status: DC
  Administered 2012-11-18 – 2012-11-19 (×5): 4 mg via ORAL
  Filled 2012-11-17 (×11): qty 1

## 2012-11-17 MED ORDER — PROPOFOL INFUSION 10 MG/ML OPTIME
INTRAVENOUS | Status: DC | PRN
  Administered 2012-11-17: 100 ug/kg/min via INTRAVENOUS

## 2012-11-17 MED ORDER — NALOXONE HCL 0.4 MG/ML IJ SOLN: 0.4000 mg | INTRAMUSCULAR | Status: DC | PRN

## 2012-11-17 SURGICAL SUPPLY — 72 items
BLADE SURG 10 STRL SS (BLADE) ×3 IMPLANT
BLADE SURG ROTATE 9660 (MISCELLANEOUS) IMPLANT
CAGE COUGAR LS 18X12X55 (Cage) ×1 IMPLANT
CANISTER SUCTION 2500CC (MISCELLANEOUS) ×1 IMPLANT
CLOTH BEACON ORANGE TIMEOUT ST (SAFETY) ×3 IMPLANT
CLSR STERI-STRIP ANTIMIC 1/2X4 (GAUZE/BANDAGES/DRESSINGS) ×2 IMPLANT
CONNECTOR EXPEDIUM TI 55MM (Connector) ×1 IMPLANT
CORDS BIPOLAR (ELECTRODE) ×3 IMPLANT
COVER SURGICAL LIGHT HANDLE (MISCELLANEOUS) ×3 IMPLANT
DRAIN CHANNEL 15F RND FF W/TCR (WOUND CARE) ×1 IMPLANT
DRAPE C-ARM 42X72 X-RAY (DRAPES) ×3 IMPLANT
DRAPE INCISE IOBAN 66X45 STRL (DRAPES) ×3 IMPLANT
DRAPE ORTHO SPLIT 77X108 STRL (DRAPES) ×3
DRAPE POUCH INSTRU U-SHP 10X18 (DRAPES) ×3 IMPLANT
DRAPE SURG ORHT 6 SPLT 77X108 (DRAPES) ×2 IMPLANT
DRAPE U-SHAPE 47X51 STRL (DRAPES) ×6 IMPLANT
DRSG MEPILEX BORDER 4X12 (GAUZE/BANDAGES/DRESSINGS) ×1 IMPLANT
DRSG MEPILEX BORDER 4X4 (GAUZE/BANDAGES/DRESSINGS) ×2 IMPLANT
DRSG MEPILEX BORDER 4X8 (GAUZE/BANDAGES/DRESSINGS) ×3 IMPLANT
DURAPREP 26ML APPLICATOR (WOUND CARE) ×3 IMPLANT
ELECT BLADE 4.0 EZ CLEAN MEGAD (MISCELLANEOUS) ×3
ELECT REM PT RETURN 9FT ADLT (ELECTROSURGICAL) ×6
ELECTRODE BLDE 4.0 EZ CLN MEGD (MISCELLANEOUS) ×2 IMPLANT
ELECTRODE REM PT RTRN 9FT ADLT (ELECTROSURGICAL) ×2 IMPLANT
EVACUATOR SILICONE 100CC (DRAIN) ×1 IMPLANT
GAUZE SPONGE 4X4 16PLY XRAY LF (GAUZE/BANDAGES/DRESSINGS) ×3 IMPLANT
GLOVE BIOGEL PI IND STRL 8.5 (GLOVE) ×2 IMPLANT
GLOVE BIOGEL PI INDICATOR 8.5 (GLOVE) ×1
GLOVE ECLIPSE 8.5 STRL (GLOVE) ×3 IMPLANT
GOWN PREVENTION PLUS XXLARGE (GOWN DISPOSABLE) ×3 IMPLANT
GOWN STRL REIN XL XLG (GOWN DISPOSABLE) ×6 IMPLANT
GUIDEWIRE PIPELINE IS BLUNT (WIRE) ×2 IMPLANT
KIT BASIN OR (CUSTOM PROCEDURE TRAY) ×3 IMPLANT
KIT ORACLE NEUROMONITING (KITS) ×1 IMPLANT
KIT ROOM TURNOVER OR (KITS) ×3 IMPLANT
KIT STIMULAN RAPID CURE  10CC (Orthopedic Implant) ×1 IMPLANT
KIT STIMULAN RAPID CURE 10CC (Orthopedic Implant) IMPLANT
MANIFOLD NEPTUNE WASTE (CANNULA) ×1 IMPLANT
MIX DBX 20CC MTF (Putty) ×1 IMPLANT
NDL SPNL 18GX3.5 QUINCKE PK (NEEDLE) ×2 IMPLANT
NEEDLE 22X1 1/2 (OR ONLY) (NEEDLE) ×3 IMPLANT
NEEDLE SPNL 18GX3.5 QUINCKE PK (NEEDLE) ×3 IMPLANT
NS IRRIG 1000ML POUR BTL (IV SOLUTION) ×4 IMPLANT
PACK LAMINECTOMY ORTHO (CUSTOM PROCEDURE TRAY) ×3 IMPLANT
PACK UNIVERSAL I (CUSTOM PROCEDURE TRAY) ×3 IMPLANT
PAD ARMBOARD 7.5X6 YLW CONV (MISCELLANEOUS) ×6 IMPLANT
PATTIES SURGICAL .5 X.5 (GAUZE/BANDAGES/DRESSINGS) ×1 IMPLANT
PATTIES SURGICAL .5X1.5 (GAUZE/BANDAGES/DRESSINGS) ×1 IMPLANT
ROD EXPEDIUM 120MM (Rod) ×1 IMPLANT
SCREW POLYAXIAL 7X45MM (Screw) ×2 IMPLANT
SCREW SET SINGLE INNER (Screw) ×1 IMPLANT
SPONGE LAP 4X18 X RAY DECT (DISPOSABLE) ×11 IMPLANT
SPONGE SURGIFOAM ABS GEL 100 (HEMOSTASIS) ×3 IMPLANT
STAPLER VISISTAT 35W (STAPLE) ×3 IMPLANT
STRIP CLOSURE SKIN 1/2X4 (GAUZE/BANDAGES/DRESSINGS) IMPLANT
SURGIFLO TRUKIT (HEMOSTASIS) ×3 IMPLANT
SUT ETHILON 2 0 FS 18 (SUTURE) ×1 IMPLANT
SUT MNCRL AB 3-0 PS2 18 (SUTURE) ×6 IMPLANT
SUT VIC AB 1 CT1 27 (SUTURE) ×9
SUT VIC AB 1 CT1 27XBRD ANBCTR (SUTURE) ×4 IMPLANT
SUT VIC AB 1 CTX 36 (SUTURE) ×12
SUT VIC AB 1 CTX36XBRD ANBCTR (SUTURE) ×4 IMPLANT
SUT VIC AB 2-0 CT1 18 (SUTURE) ×8 IMPLANT
SYR BULB IRRIGATION 50ML (SYRINGE) ×3 IMPLANT
SYR CONTROL 10ML LL (SYRINGE) ×3 IMPLANT
TAPE CLOTH 4X10 WHT NS (GAUZE/BANDAGES/DRESSINGS) ×6 IMPLANT
TOWEL OR 17X24 6PK STRL BLUE (TOWEL DISPOSABLE) ×3 IMPLANT
TOWEL OR 17X26 10 PK STRL BLUE (TOWEL DISPOSABLE) ×3 IMPLANT
TRAY FOLEY CATH 14FRSI W/METER (CATHETERS) ×3 IMPLANT
TUBE CONNECTING 12X1/4 (SUCTIONS) ×2 IMPLANT
WATER STERILE IRR 1000ML POUR (IV SOLUTION) ×6 IMPLANT
WAVEGUIDE NARROW/FLAT LIGHT (MISCELLANEOUS) ×2 IMPLANT

## 2012-11-17 NOTE — Progress Notes (Signed)
Patient refusing CPAP at this time. Encouraged to call if he changes his mind. RT will continue to monitor.

## 2012-11-17 NOTE — H&P (Signed)
No change in clinical exam H+P reviewed  

## 2012-11-17 NOTE — Anesthesia Preprocedure Evaluation (Signed)
Anesthesia Evaluation  Patient identified by MRN, date of birth, ID band Patient awake    Reviewed: Allergy & Precautions, H&P , NPO status , Patient's Chart, lab work & pertinent test results  Airway Mallampati: I TM Distance: >3 FB Neck ROM: Full    Dental  (+) Missing, Partial Upper and Teeth Intact   Pulmonary sleep apnea and Continuous Positive Airway Pressure Ventilation ,  breath sounds clear to auscultation        Cardiovascular hypertension, Pt. on medications Rhythm:Regular Rate:Normal     Neuro/Psych    GI/Hepatic   Endo/Other    Renal/GU      Musculoskeletal   Abdominal   Peds  Hematology   Anesthesia Other Findings Missing upper right canine tooth with partial usually present.  Reproductive/Obstetrics                           Anesthesia Physical Anesthesia Plan  ASA: II  Anesthesia Plan: General   Post-op Pain Management:    Induction: Intravenous  Airway Management Planned: Oral ETT  Additional Equipment:   Intra-op Plan:   Post-operative Plan: Extubation in OR  Informed Consent: I have reviewed the patients History and Physical, chart, labs and discussed the procedure including the risks, benefits and alternatives for the proposed anesthesia with the patient or authorized representative who has indicated his/her understanding and acceptance.   Dental advisory given  Plan Discussed with: CRNA, Anesthesiologist and Surgeon  Anesthesia Plan Comments:         Anesthesia Quick Evaluation

## 2012-11-17 NOTE — Plan of Care (Signed)
Problem: Consults Goal: Diagnosis - Spinal Surgery Thoraco/Lumbar Spine Fusion     

## 2012-11-17 NOTE — Transfer of Care (Signed)
Immediate Anesthesia Transfer of Care Note  Patient: [name]  Procedure(s) Performed: Procedure(s) with comments: XLIF L3 - L4 POSTERIOR L3 -L4 FUSION INSTRUMENTATION/L2 - L3 DECOMPRESSION (REVISION OF POSTERIOR HARDWARE) 1 LEVEL (N/A) - procedure #1: start 9604, end 1057. procedure #2: start 1126, end 1510 POSTERIOR LUMBAR FUSION 1 WITH HARDWARE REMOVAL LUMBAR THREE-FOUR  Patient Location: PACU  Anesthesia Type:General  Level of Consciousness: awake, alert  and oriented  Airway & Oxygen Therapy: Patient Spontanous Breathing and Patient connected to nasal cannula oxygen  Post-op Assessment: Report given to PACU RN, Post -op Vital signs reviewed and stable and Patient moving all extremities X 4  Post vital signs: Reviewed and stable  Complications: No apparent anesthesia complications

## 2012-11-17 NOTE — Brief Op Note (Signed)
11/17/2012  2:40 PM  PATIENT:  [name]  57 y.o. male  PRE-OPERATIVE DIAGNOSIS:  adjacent segment DDD with slip Lumbar 3 -Lumbar 4  POST-OPERATIVE DIAGNOSIS:  adjacent segment DDD with slip Lumbar 3 -Lumbar 4  PROCEDURE:  Procedure(s) with comments: XLIF L3 - L4 POSTERIOR L3 -L4 FUSION INSTRUMENTATION/L2 - L3 DECOMPRESSION (REVISION OF POSTERIOR HARDWARE) 1 LEVEL (N/A) - procedure #1: start 1610, end 1057. procedure #2: start 1126,  POSTERIOR LUMBAR FUSION 1 WITH HARDWARE REMOVAL LUMBAR THREE-FOUR  SURGEON:  Surgeon(s) and Role: Panel 1:    * Venita Lick, MD - Primary  Panel 2:    * Venita Lick, MD - Primary  PHYSICIAN ASSISTANT:   ASSISTANTS: none   ANESTHESIA:   general  EBL:  Total I/O In: 3000 [I.V.:3000] Out: 650 [Urine:350; Blood:300]  BLOOD ADMINISTERED:none  DRAINS: (1) Jackson-Pratt drain(s) with closed bulb suction in the back   LOCAL MEDICATIONS USED:  MARCAINE     SPECIMEN:  No Specimen  DISPOSITION OF SPECIMEN:  N/A  COUNTS:  YES  TOURNIQUET:  * No tourniquets in log *  DICTATION: .Other Dictation: Dictation Number [id number]  PLAN OF CARE: Admit to inpatient   PATIENT DISPOSITION:  PACU - hemodynamically stable.

## 2012-11-17 NOTE — Preoperative (Signed)
Beta Blockers   Reason not to administer Beta Blockers:Not Applicable 

## 2012-11-17 NOTE — Progress Notes (Signed)
Orthopedic Tech Progress Note Patient Details:  [name] 02-28-1956 [id number]  Patient ID: [name], male   DOB: Feb 27, 1956, 57 y.o.   MRN: [id number] Pt states that they already have a lumbar corsett brace;rn notified  Nikki Dom 11/17/2012, 6:42 PM

## 2012-11-18 ENCOUNTER — Inpatient Hospital Stay (HOSPITAL_COMMUNITY): Payer: Medicare Other

## 2012-11-18 ENCOUNTER — Encounter (HOSPITAL_COMMUNITY): Payer: Self-pay | Admitting: Orthopedic Surgery

## 2012-11-18 MED ORDER — DOXEPIN HCL 6 MG PO TABS
6.0000 mg | ORAL_TABLET | Freq: Every evening | ORAL | Status: DC | PRN
  Filled 2012-11-18: qty 1

## 2012-11-18 MED ORDER — ALUM & MAG HYDROXIDE-SIMETH 200-200-20 MG/5ML PO SUSP
30.0000 mL | Freq: Four times a day (QID) | ORAL | Status: DC | PRN
  Administered 2012-11-18: 30 mL via ORAL

## 2012-11-18 NOTE — Evaluation (Signed)
Physical Therapy Evaluation Patient Details [name] MRN: [id number] DOB: [birth year] Today's Date: 11/18/2012 Time: 7829-5621 PT Time Calculation (min): 26 min  PT Assessment / Plan / Recommendation Clinical Impression  Pt is a pleasant 57 y.o. male s/p L3-L4 fusion, hardware removal and decompression. Pt moving well today. Demo good understanding of back precautions and safety. Will continue to f/u with pt to increase mobility and safety to return to PLOF. Pt will have wife at home upon D/C for 24/7 care. No DME needs at this time.      PT Assessment  Patient needs continued PT services    Follow Up Recommendations  Supervision/Assistance - 24 hour    Does the patient have the potential to tolerate intense rehabilitation      Barriers to Discharge        Equipment Recommendations  None recommended by PT    Recommendations for Other Services     Frequency Min 5X/week    Precautions / Restrictions Precautions Precautions: Back;Fall Precaution Booklet Issued: Yes (comment) Required Braces or Orthoses: Spinal Brace Spinal Brace: Applied in sitting position;Lumbar corset Restrictions Weight Bearing Restrictions: No   Pertinent Vitals/Pain Denies pain initially. With activity reports pain 3/10. Pt has PCA urged to use PRN.       Mobility  Bed Mobility Bed Mobility: Supine to Sit;Sitting - Scoot to Edge of Bed Supine to Sit: 5: Supervision;With rails Sitting - Scoot to Edge of Bed: 6: Modified independent (Device/Increase time) Details for Bed Mobility Assistance: cues for log roll technique Transfers Transfers: Sit to Stand;Stand to Sit Sit to Stand: 4: Min guard;From bed Stand to Sit: 5: Supervision;With armrests;To chair/3-in-1 Details for Transfer Assistance: cues to adhere to back precautions and not bend fwd with trunk; cues to use UEs to control descent to chair.  Ambulation/Gait Ambulation/Gait Assistance: 4: Min guard Ambulation Distance (Feet): 300  Feet Assistive device: Other (Comment) (IV pole) Ambulation/Gait Assistance Details: min G for steadying; pt demo good safety awareness; amb with IV pole to steady himself. Gait Pattern: Step-through pattern;Wide base of support Gait velocity: decreased Stairs: No Wheelchair Mobility Wheelchair Mobility: No    Exercises     PT Diagnosis: Difficulty walking;Acute pain  PT Problem List: Decreased strength;Decreased range of motion;Decreased activity tolerance;Decreased safety awareness;Pain PT Treatment Interventions: Stair training;Gait training;Therapeutic activities;Functional mobility training;Balance training;Neuromuscular re-education;Patient/family education   PT Goals Acute Rehab PT Goals PT Goal Formulation: With patient Time For Goal Achievement: 11/25/12 Potential to Achieve Goals: Good Pt will go Supine/Side to Sit: with modified independence PT Goal: Supine/Side to Sit - Progress: Goal set today Pt will go Sit to Supine/Side: with modified independence PT Goal: Sit to Supine/Side - Progress: Goal set today Pt will go Sit to Stand: with modified independence PT Goal: Sit to Stand - Progress: Goal set today Pt will go Stand to Sit: with modified independence PT Goal: Stand to Sit - Progress: Goal set today Pt will Transfer Bed to Chair/Chair to Bed: with modified independence PT Transfer Goal: Bed to Chair/Chair to Bed - Progress: Goal set today Pt will Ambulate: >150 feet;with modified independence;with least restrictive assistive device PT Goal: Ambulate - Progress: Goal set today Pt will Go Up / Down Stairs: 1-2 stairs;with supervision;with least restrictive assistive device PT Goal: Up/Down Stairs - Progress: Goal set today  Visit Information  Last PT Received On: 11/18/12 Assistance Needed: +1    Subjective Data  Subjective: I have had back surgery before.  Patient Stated Goal: to go  home with wife   Prior Functioning  Home Living Lives With:  Spouse Available Help at Discharge: Family;Available 24 hours/day Type of Home: House Home Access: Stairs to enter [employer] of Steps: 2 Entrance Stairs-Rails: None Home Layout: One level Bathroom Shower/Tub: Engineer, manufacturing systems: Standard Bathroom Accessibility: Yes How Accessible: Accessible via walker Home Adaptive Equipment: Bedside commode/3-in-1;Shower chair with back;Reacher;Walker - rolling Prior Function Level of Independence: Independent Able to Take Stairs?: Yes Driving: Yes Vocation: Retired Musician: No difficulties Dominant Hand: Right    Cognition  Cognition Overall Cognitive Status: Appears within functional limits for tasks assessed/performed Arousal/Alertness: Awake/alert Orientation Level: Oriented X4 / Intact Behavior During Session: WFL for tasks performed    Extremity/Trunk Assessment Right Lower Extremity Assessment RLE ROM/Strength/Tone: WFL for tasks assessed RLE Sensation: WFL - Light Touch Left Lower Extremity Assessment LLE ROM/Strength/Tone: WFL for tasks assessed LLE Sensation: WFL - Light Touch Trunk Assessment Trunk Assessment: Normal   Balance Balance Balance Assessed: No  End of Session PT - End of Session Equipment Utilized During Treatment: Gait belt;Back brace Activity Tolerance: Patient tolerated treatment well Patient left: in chair;with call bell/phone within reach;with family/visitor present Nurse Communication: Mobility status  GP     Donell Sievert, [place] [phone] 11/18/2012, 11:47 AM

## 2012-11-18 NOTE — Progress Notes (Signed)
    Subjective: Procedure(s) (LRB): XLIF L3 - L4 POSTERIOR L3 -L4 FUSION INSTRUMENTATION/L2 - L3 DECOMPRESSION (REVISION OF POSTERIOR HARDWARE) 1 LEVEL (N/A) POSTERIOR LUMBAR FUSION 1 WITH HARDWARE REMOVAL LUMBAR THREE-FOUR 1 Day Post-Op  Patient reports pain as 3 on 0-10 scale.  Reports decreased leg pain reports incisional back pain   N/A void Negative bowel movement Positive flatus Negative chest pain or shortness of breath  Objective: Vital signs in last 24 hours: Temp:  [97.5 F (36.4 C)-98.2 F (36.8 C)] 97.8 F (36.6 C) (03/27 0500) Pulse Rate:  [56-96] 96 (03/27 0500) Resp:  [5-23] 20 (03/27 0800) BP: (112-134)/(63-77) 132/72 mmHg (03/27 0955) SpO2:  [94 %-100 %] 94 % (03/27 0800) Weight:  [122.471 kg (270 lb)] 122.471 kg (270 lb) (03/26 1853)  Intake/Output from previous day: 03/26 0701 - 03/27 0700 In: 6250 [P.O.:480; I.V.:5520; IV Piggyback:250] Out: 3408 [Urine:2950; Drains:158; Blood:300]  Labs:  Recent Labs  11/17/12 1306  HCT 37.0*    Recent Labs  11/17/12 1306  NA 139  K 4.3  GLUCOSE 135*   No results found for this basename: LABPT, INR,  in the last 72 hours  Physical Exam: Neurologically intact ABD soft Neurovascular intact Intact pulses distally Incision: dressing C/D/I and no drainage Compartment soft  Assessment/Plan: Patient stable  xrays satisfactory Continue abx since drain will remain D/c foley and pca Continue mobilization with physical therapy Continue care  Advance diet Up with therapy  Venita Lick, MD [facility] [phone]

## 2012-11-18 NOTE — Op Note (Signed)
NAMEADRION, MENZ NO.:  [id number]  MEDICAL RECORD NO.:  [id number]  LOCATION:  [id number]                        FACILITY:  [facility]  PHYSICIAN:  Alvy Beal, MD    DATE OF BIRTH:  Feb 17, 1956  DATE OF PROCEDURE: DATE OF DISCHARGE:                              OPERATIVE REPORT   PREOPERATIVE DIAGNOSES:  Adjacent segment degenerative disk disease, L3- 4, epidural lipomatosis with central stenosis at L2-3.  POSTOPERATIVE DIAGNOSES:  Adjacent segment degenerative disk disease, L3- 4, epidural lipomatosis with central stenosis at L2-3.  OPERATIVE PROCEDURE: 1. Lateral L3-4 interbody fusion with DePuy carbon fiber     intervertebral cage, size 12 lordotic 55 mm in length, packed with     DBX mix. 2. Removal of hardware, posterior spine (cross-link), and unilateral     pedicle screw fusion L3-4 with bilateral posterolateral     arthrodesis, L3-4 and posterior decompression at L2-3.  COMPLICATIONS:  None.  CONDITION:  Stable.  HISTORY:  This is a very pleasant gentleman who has been well-known to my practice.  Several years ago, he had an L4-5, L5-S1 posterior transforaminal lumbar interbody fusion.  The patient did well until recently when he started having adjacent segment degenerative disease and became symptomatic.  Despite injection therapy, physical therapy, anti-inflammatory medications, and narcotic medications, he continued to deteriorate.  As a result of the adjacent segment issue, we elected to proceed with surgery.  All appropriate risks, benefits, and alternatives were discussed with the patient and consent was obtained.  OPERATIVE NOTE:  The patient was brought to the operating room, placed supine on the operating table.  After successful induction of general anesthesia and endotracheal intubation, TEDs, SCDs, and a Foley were inserted.  The NeuroStim monitor representative then placed all appropriate needles.  The patient was then turned into a  right lateral decubitus position.  Axillary roll was placed.  All bony prominences were well padded, and the patient was taped down to the bed appropriately.  I then confirmed satisfactory position of the L3-4 level with AP and lateral intraoperative fluoro.  Once this was confirmed and the patient was secured, the lateral aspect of the flank was prepped and draped in a standard fashion.  Time-out was then done to confirm patient, procedure, and all other pertinent important data.  Once this was completed, I then infiltrated the proposed incision site over the L3-4 disk space.  I then made an incision spanning the anterior posterior width, length of the disk space.  Sharp dissection was carried out down to the fascia of the external oblique.  I then made a counter incision in the posterolateral aspect of the back 1 fingerbreadth away from the initial lateral incision.  I then bluntly went into the retroperitoneal fascia, and then began mobilizing the fascia.  Once I had done this, I then brought my finger to the undersurface of the oblique musculature and then dissected down bluntly.  Once I delivered my finger into the lateral incision, I placed the trocar on top of it and brought it down to the surface of the psoas muscle.  I then stimulated the psoas muscle superficially to ensure that I was not  on top of the plexus.  Once this was done, I then advanced the trocar down to the lateral aspect of the disk space.  This was placed at the junction of the anterior third, posterior 2/3rds of the disk space.  Once this was done, I then stimulated circumferentially and there was no evidence of compression or irritation to the plexus.  I then placed the dilating portals and then placed the retractor.  I then with a cuff of muscle moved the retractor posteriorly and then secured it to the disk space with a trocar.  Once this was done, I then stimulated again confirming that the lumbar plexus was  actually posterior to my retractor.  At this point, I could clearly visualize the lateral aspect of the 3-4 disk space.  Annulotomy was performed with a #10 blade scalpel.  I then used a combination of pituitary rongeurs, curettes, and Kerrison rongeurs to remove all the disk material.  I then used a Cobb elevator to release the annulus on the contralateral surface.  Once this was done, I was able to use sequential shavers removing the cartilaginous endplate.  I then scraped and rasped the endplates until I had bleeding subchondral bone.  I then used sequential trial devices starting from a size 6 up to a size 12.  The 12 lordotic cage provided the best fit.  I then obtained this implant in a 55 mm length, it was packed with DBX mix and then malleted to the appropriate depth.  I had excellent purchase and positioning.  I was able to restore the anterior intervertebral height and thereby indirectly decompress the 3-4 space.  Once this was completed, I irrigated the wound copiously with normal saline, obtained hemostasis using bipolar electrocautery.  I then removed the trocar and closed the external oblique fascia with a running #1 Vicryl suture, and then a layer of 2-0 Vicryl suture, and 3-0 Monocryl skin closed.  The secondary counter incision was closed in a similar fashion.  At this point in time, Tegaderm dressings were placed and the patient's drapes were removed.  He was then placed supine on the operative table, and the spine Jean Rosenthal frame was brought into the surgical theater.  The patient was then turned prone on to this bed.  All bony prominences were well padded, and the posterior spine was prepped and draped in a standard fashion.  A second time-out was done to confirm the posterior portion of the procedure.  Midline incision was made starting at the level of the rod connector and proceeded superior just beyond the L2 spinous process.  Sharp dissection was carried out  down to the deep fascia.  I then mobilized the paraspinal muscles to completely expose the L2 and L3 spinous processes.  They were still intact.  I then identified the L2-3 facet complex and dissected along the lateral aspect of this until I could expose the L3 transverse process.  Once this was exposed bilaterally, I could then proceed with the pedicle screw fixation.  Before doing this, I did dissect the posterolateral aspects to expose the L4 pedicle screw and the rod connector that was left in place from his previous fusion.  Once I had this, I then dissected across the midline along the superior surface of the rod connector to ensure that there was no trauma would come to the thecal sac.  Once I had done this, I then released the locking bolts for the transverse connector and then removed it.  Once this  was done, I was able to use a rod connector in order to facilitate the fusion.  Using the midportion of the transverse process, I identified the L3 pedicle, pierced through the cortex, and then advanced the pedicle probe through the pedicle and into the vertebral body on the left side.  I then palpated this hole with ball-tip feeler, tapped with a 6-0 tap, repalpated with a ball- tipped Feeler and then placed a 45 mm long 7-0 diameter pedicle screw. I had excellent purchase with the screws.  I then stimulated the screw and there was no electrodiagnostic evidence of pedicle breach. On the right-hand side, I repeated this procedure.  However, after placing the pedicle screw and taking an x-ray, I realized that there was a lateral blow-out.  Because of this, I elected only to use unilateral construct.  This would have been the left pedicle screw.  On the right side, I simply obtained hemostasis, burred the lateral aspect of the facet complex, and then packed the posterolateral gutter with DBX mix. On the left-hand side, I then bent a rod and secured it to the rod to rod connector and  then to the L3 pedicle screw.  All the locking nuts were torqued appropriately.  With the unilateral pedicle screw in place and the inner disk cage in place, I felt as though the construct was satisfactory.  I then proceeded with decompression.  I removed the spinous process of L3 and a portion that of L2 and saved this for bone graft.  I then developed a plane underneath the L3 lamina and then using a 2 and 3 mm Kerrison performed a central laminectomy of L3.  I then carried my dissection superiorly until I was at the L2 lamina.  I then identified the kind of thickened epidural fat and resected it.  At this point, I had an adequate decompression.  Under fluoroscopic guidance, I confirmed that I was well above the L2 pedicle and well below the L3 pedicle with my decompression.  At this point, I irrigated the wound copiously with normal saline and obtained hemostasis using bipolar electrocautery and FloSeal.  Thrombin-soaked Gelfoam patty was placed over the laminotomy over the decompression site and then I placed a deep drain.  I then packed the both posterolateral corners at L3-4 with bone graft.  I then placed antibiotic impregnated beads in the deep portion of the wound and closed it with #1 Vicryl sutures.  I then placed some antibiotic impregnated beads in the superficial and closed that with 2-0 interrupted Vicryl, 3-0 Monocryl for the skin.  Dry dressings were then applied.  The patient was then extubated and transferred to the PACU without incident.  At the end of the case, all needle and sponge counts were correct.  There was no adverse intraoperative events.     Alvy Beal, MD     DDB/MEDQ  D:  11/17/2012  T:  11/18/2012  Job:  [id number]

## 2012-11-18 NOTE — Progress Notes (Signed)
OT Cancellation Note  Patient Details [name] MRN: [id number] DOB: [birth year]   Cancelled Treatment:   Attempted to see patient on 2 occasions this am however patient's wife has his back brace at home and planned to bring it in today.  OT will attempt eval again tomorrow or later today if time allows.  Yesha Muchow 11/18/2012, 11:24 AM

## 2012-11-18 NOTE — Progress Notes (Signed)
Explained to pt his foley cath is due to be dc'd per order at 6am today. He stated he discussed a different plan with Dr. Shon Baton in which it would stay in until right before he leaves. Explained to pt that we must remove it sooner so that we can assure he is voiding before he goes home, especially with his h/o prostate CA. He requested to at least wait until his wife comes in this AM due to his "frequent leaking" and he wishes for her to be here to assist him with that. He stated she will be here sometime early morning. Agreed it will be dc'd as soon as she arrives. Wife is also bringing his brace this AM as well. Will continue to monitor.

## 2012-11-18 NOTE — Care Management Note (Signed)
CARE MANAGEMENT NOTE 11/18/2012  Patient:  [name], [name]   Account Number:  [id number]  Date Initiated:  11/18/2012  Documentation initiated by:  Vance Peper  Subjective/Objective Assessment:   57 yr old male s/p L3-4 fusion with hardware removal     Action/Plan:   No home health needs per OT, wife to bring brace and then PT will eval.  Status of service:  In process, will continue to follow

## 2012-11-18 NOTE — Progress Notes (Signed)
PT is not recommending SNF placement. CSW will make CM aware. Clinical Social Worker will sign off for now as social work intervention is no longer needed. Please consult [place] again if new need arises.   Sabino Niemann, MSW [phone]

## 2012-11-18 NOTE — Progress Notes (Signed)
UR COMPLETED  

## 2012-11-19 LAB — TYPE AND SCREEN
ABO/RH(D): A POS
Antibody Screen: NEGATIVE
Unit division: 0

## 2012-11-19 MED ORDER — DOCUSATE SODIUM 100 MG PO CAPS: 100.0000 mg | ORAL_CAPSULE | Freq: Three times a day (TID) | ORAL | Status: DC | PRN

## 2012-11-19 MED ORDER — OXYCODONE-ACETAMINOPHEN 10-325 MG PO TABS: 1.0000 | ORAL_TABLET | ORAL | Status: DC | PRN

## 2012-11-19 MED ORDER — POLYETHYLENE GLYCOL 3350 17 GM/SCOOP PO POWD: 17.0000 g | Freq: Every day | ORAL | Status: DC

## 2012-11-19 MED ORDER — ONDANSETRON HCL 4 MG PO TABS: 4.0000 mg | ORAL_TABLET | Freq: Three times a day (TID) | ORAL | Status: DC | PRN

## 2012-11-19 MED ORDER — METHOCARBAMOL 500 MG PO TABS: 500.0000 mg | ORAL_TABLET | Freq: Three times a day (TID) | ORAL | Status: DC | PRN

## 2012-11-19 NOTE — Progress Notes (Signed)
Occupational Therapy Evaluation Patient Details Name: COLETON WOON MRN: [id number] DOB: December 17, 1955 Today's Date: 11/19/2012 Time: 1308-6578 OT Time Calculation (min): 9 min  OT Assessment / Plan / Recommendation Clinical Impression   This 58 y.o. Male admitted for lumbar fusion.  All education completed.  Pt is ready for discharge.    OT Assessment  Patient does not need any further OT services    Follow Up Recommendations  No OT follow up    Barriers to Discharge      Equipment Recommendations  None recommended by OT    Recommendations for Other Services    Frequency       Precautions / Restrictions Precautions Precautions: Back;Fall Precaution Booklet Issued: Yes (comment) Required Braces or Orthoses: Spinal Brace Spinal Brace: Applied in sitting position;Lumbar corset Restrictions Weight Bearing Restrictions: No   Pertinent Vitals/Pain     ADL  Lower Body Bathing: Simulated;Supervision/safety Where Assessed - Lower Body Bathing: Supported sit to stand Lower Body Dressing: Supervision/safety Where Assessed - Lower Body Dressing: Unsupported sit to stand ADL Comments: Pt preparing for discharege.  Wife assisted him with LB ADLs.  Pt is able to access feet by crossing ankles over knees.  Pt and wife were instructed in safe technique for LB ADLs.  Pt does not feel he needs to practice tub or toilet transfers    OT Diagnosis:    OT Problem List:   OT Treatment Interventions:     OT Goals    Visit Information  Last OT Received On: 11/19/12 Assistance Needed: +1    Subjective Data  Subjective: "She helped me"  re: how he performed LB ADLs Patient Stated Goal: Pt didn't state   Prior Functioning     Home Living Lives With: Spouse Available Help at Discharge: Family;Available 24 hours/day Type of Home: House Home Access: Stairs to enter [employer] of Steps: 2 Entrance Stairs-Rails: None Home Layout: One level Bathroom Shower/Tub: Teacher, music: Standard Bathroom Accessibility: Yes How Accessible: Accessible via walker Home Adaptive Equipment: Bedside commode/3-in-1;Shower chair with back;Reacher;Walker - rolling Prior Function Level of Independence: Independent Able to Take Stairs?: Yes Driving: Yes Vocation: Retired Musician: No difficulties Dominant Hand: Right         Vision/Perception     Copywriter, advertising Overall Cognitive Status: Appears within functional limits for tasks assessed/performed Arousal/Alertness: Awake/alert Orientation Level: Oriented X4 / Intact Behavior During Session: WFL for tasks performed    Extremity/Trunk Assessment Right Upper Extremity Assessment RUE ROM/Strength/Tone: Within functional levels Left Upper Extremity Assessment LUE ROM/Strength/Tone: Within functional levels Trunk Assessment Trunk Assessment: Normal     Mobility Transfers Transfers: Sit to Stand;Stand to Sit Sit to Stand: 7: Independent;From bed Stand to Sit: 7: Independent;To bed     Exercise     Balance     End of Session OT - End of Session Equipment Utilized During Treatment: Back brace Activity Tolerance: Patient tolerated treatment well Patient left: with family/visitor present  GO     Natassia Guthridge, Ursula Alert M 11/19/2012, 4:12 PM

## 2012-11-19 NOTE — Progress Notes (Signed)
    Subjective: Procedure(s) (LRB): XLIF L3 - L4 POSTERIOR L3 -L4 FUSION INSTRUMENTATION/L2 - L3 DECOMPRESSION (REVISION OF POSTERIOR HARDWARE) 1 LEVEL (N/A) POSTERIOR LUMBAR FUSION 1 WITH HARDWARE REMOVAL LUMBAR THREE-FOUR 2 Days Post-Op  Patient reports pain as 3 on 0-10 scale.  Reports decreased leg pain reports incisional back pain   Positive void Positive bowel movement Positive flatus Negative chest pain or shortness of breath  Objective: Vital signs in last 24 hours: Temp:  [98.3 F (36.8 C)-98.7 F (37.1 C)] 98.7 F (37.1 C) (03/27 2134) Pulse Rate:  [78-84] 84 (03/27 2134) Resp:  [18-20] 18 (03/27 2134) BP: (93-139)/(48-72) 139/63 mmHg (03/27 2134) SpO2:  [93 %-94 %] 94 % (03/27 2134)  Intake/Output from previous day: 03/27 0701 - 03/28 0700 In: -  Out: 1025 [Urine:940; Drains:85]  Labs:  Recent Labs  11/17/12 1306  HCT 37.0*    Recent Labs  11/17/12 1306  NA 139  K 4.3  GLUCOSE 135*   No results found for this basename: LABPT, INR,  in the last 72 hours  Physical Exam: Neurologically intact ABD soft Neurovascular intact Intact pulses distally Incision: dressing C/D/I and no drainage Compartment soft  Assessment/Plan: Patient stable  xrays satisfactory Continue mobilization with physical therapy Continue care  Advance diet Up with therapy D/C IV fluids Patient doing well.  D/c drain and ok for d/c to home today. F/u 2 weeks  Venita Lick, MD [facility] [phone]

## 2012-11-19 NOTE — Discharge Summary (Signed)
Patient ID: [name] MRN: [id number] DOB/AGE: 03-29-1956 57 y.o.  Admit date: 11/17/2012 Discharge date: 11/19/2012  Admission Diagnoses:  Principal Problem:   Back pain   Discharge Diagnoses:  Principal Problem:   Back pain  status post Procedure(s): XLIF L3 - L4 POSTERIOR L3 -L4 FUSION INSTRUMENTATION/L2 - L3 DECOMPRESSION (REVISION OF POSTERIOR HARDWARE) 1 LEVEL POSTERIOR LUMBAR FUSION 1 WITH HARDWARE REMOVAL LUMBAR THREE-FOUR  Past Medical History  Diagnosis Date  . Cancer 2007    prostate CA  . Hypertension   . Shortness of breath   . Mental disorder   . Anxiety   . Depression   . Urinary incontinence     due to prostate removal  . Hypercholesteremia   . Arthritis   . Sleep apnea     wears CPAP nightly    Surgeries: Procedure(s): XLIF L3 - L4 POSTERIOR L3 -L4 FUSION INSTRUMENTATION/L2 - L3 DECOMPRESSION (REVISION OF POSTERIOR HARDWARE) 1 LEVEL POSTERIOR LUMBAR FUSION 1 WITH HARDWARE REMOVAL LUMBAR THREE-FOUR on 11/17/2012   Consultants:  none  Discharged Condition: Improved  Hospital Course: [name] is an 57 y.o. male who was admitted 11/17/2012 for operative treatment of Back pain. Patient failed conservative treatments (please see the history and physical for the specifics) and had severe unremitting pain that affects sleep, daily activities and work/hobbies. After pre-op clearance, the patient was taken to the operating room on 11/17/2012 and underwent  Procedure(s): XLIF L3 - L4 POSTERIOR L3 -L4 FUSION INSTRUMENTATION/L2 - L3 DECOMPRESSION (REVISION OF POSTERIOR HARDWARE) 1 LEVEL POSTERIOR LUMBAR FUSION 1 WITH HARDWARE REMOVAL LUMBAR THREE-FOUR.    Patient was given perioperative antibiotics: Anti-infectives   Start     Dose/Rate Route Frequency Ordered Stop   11/17/12 1745  vancomycin (VANCOCIN) IVPB 1000 mg/200 mL premix     1,000 mg 200 mL/hr over 60 Minutes Intravenous Every 12 hours 11/17/12 1735     11/17/12 1234  vancomycin  (VANCOCIN) powder  Status:  Discontinued       As needed 11/17/12 1234 11/17/12 1536   11/17/12 0815  vancomycin (VANCOCIN) 1,500 mg in sodium chloride 0.9 % 250 mL IVPB     1,500 mg 250 mL/hr over 60 Minutes Intravenous  Once 11/17/12 0914 11/17/12 0823   11/16/12 1400  vancomycin (VANCOCIN) IVPB 1000 mg/200 mL premix  Status:  Discontinued     1,000 mg 200 mL/hr over 60 Minutes Intravenous  Once 11/16/12 1356 11/16/12 1357   11/16/12 1400  vancomycin (VANCOCIN) 1,500 mg in sodium chloride 0.9 % 500 mL IVPB  Status:  Discontinued     1,500 mg 250 mL/hr over 120 Minutes Intravenous On call 11/16/12 1359 11/17/12 1735       Patient was given sequential compression devices and early ambulation to prevent DVT.   Patient benefited maximally from hospital stay and there were no complications. At the time of discharge, the patient was urinating/moving their bowels without difficulty, tolerating a regular diet, pain is controlled with oral pain medications and they have been cleared by PT/OT.   Recent vital signs: Patient Vitals for the past 24 hrs:  BP Temp Temp src Pulse Resp SpO2  11/18/12 2134 139/63 mmHg 98.7 F (37.1 C) Oral 84 18 94 %  11/18/12 1456 93/48 mmHg 98.3 F (36.8 C) - 78 18 93 %  11/18/12 0955 132/72 mmHg - - - - -  11/18/12 0800 - - - - 20 94 %     Recent laboratory studies:  Recent  Labs  11/17/12 1306  HGB 12.6*  HCT 37.0*  NA 139  K 4.3  GLUCOSE 135*     Discharge Medications:     Medication List    STOP taking these medications       HYDROcodone-acetaminophen 5-325 MG per tablet  Commonly known as:  NORCO/VICODIN      TAKE these medications       ARIPiprazole 5 MG tablet  Commonly known as:  ABILIFY  Take 5 mg by mouth at bedtime.     COQ10 150 MG Caps  Take 1 capsule by mouth daily.     docusate sodium 100 MG capsule  Commonly known as:  COLACE  Take 1 capsule (100 mg total) by mouth 3 (three) times daily as needed for constipation.      enalapril 10 MG tablet  Commonly known as:  VASOTEC  Take 10 mg by mouth daily.     FLUoxetine HCl 60 MG Tabs  Take 60 mg by mouth daily.     gabapentin 300 MG capsule  Commonly known as:  NEURONTIN  Take 600 mg by mouth 2 (two) times daily.     Krill Oil 300 MG Caps  Take 1 capsule by mouth daily.     methocarbamol 500 MG tablet  Commonly known as:  ROBAXIN  Take 1 tablet (500 mg total) by mouth 3 (three) times daily as needed.     OMEGA-3 FISH OIL-VITAMIN D3 PO  Take 1 capsule by mouth daily.     ondansetron 4 MG tablet  Commonly known as:  ZOFRAN  Take 1 tablet (4 mg total) by mouth every 8 (eight) hours as needed for nausea.     oxyCODONE-acetaminophen 10-325 MG per tablet  Commonly known as:  PERCOCET  Take 1 tablet by mouth every 4 (four) hours as needed for pain.     polyethylene glycol powder powder  Commonly known as:  GLYCOLAX  Take 17 g by mouth daily.     SILENOR 6 MG Tabs  Generic drug:  Doxepin HCl  Take 6 mg by mouth at bedtime.     simvastatin 40 MG tablet  Commonly known as:  ZOCOR  Take 40 mg by mouth every evening.     TRILIPIX 135 MG capsule  Generic drug:  Choline Fenofibrate  Take 135 mg by mouth daily.        Diagnostic Studies: Dg Chest 2 View  11/10/2012  *RADIOLOGY REPORT*  Clinical Data: Preoperative examination (lumbar surgery), history of hypertension and smoking  CHEST - 2 VIEW  Comparison: 11/20/2010; 11/17/2009; 12/19/2008; chest CT - 10/04/2010  Findings:  Grossly unchanged cardiac silhouette and mediastinal contours.  The lungs are hyperexpanded with flattening of bilateral hemidiaphragms and mild diffuse thickening of the pulmonary interstitium.  No focal airspace opacities.  No pleural effusion or pneumothorax. Unchanged bones.  IMPRESSION: Hyperexpanded lungs and bronchitic change without acute cardiopulmonary disease.   Original Report Authenticated By: Tacey Ruiz, MD    Dg Lumbar Spine 2-3 Views  11/18/2012  *RADIOLOGY  REPORT*  Clinical Data: 57 year old male with postop lumbar spine pain.  LUMBAR SPINE - 2-3 VIEW  Comparison: Intraoperative images 11/17/2012 and earlier.  Findings: Preexisting L4-L5 and L5-S1 posterior and interbody fusion sequelae.  Addition of L3-L4 left unilateral transpedicular and interbody fusion hardware.  This may hardware appears grossly intact.  Posterior bone graft material.  Posterior postoperative drain in place.  Stable vertebral height and alignment elsewhere. Right hemi pelvis surgical clips re-identified.  IMPRESSION: 1.  New fusion hardware at L3-L4 and postoperative sequelae as detailed above.  No adverse features identified. 2.  Preexisting L4 to S1 fusion hardware.   Original Report Authenticated By: Erskine Speed, M.D.    Dg Lumbar Spine 2-3 Views  11/17/2012  *RADIOLOGY REPORT*  Clinical Data: L3-4 fusion.  DG C-ARM GT 120 MIN, LUMBAR SPINE - 2-3 VIEW  Technique: We are provided two fluoroscopic spot views of the lower lumbar spine.  Comparison:  Plain films lumbar spine 11/10/2012.  Findings: Provided images demonstrate extension of previously seen L4-S1 fusion to include the L3-4 level.  Interbody spacer is in place.  IMPRESSION: Extension of L4-S1 fusion to include the L3-4 level.   Original Report Authenticated By: Holley Dexter, M.D.    Dg Lumbar Spine 2-3 Views  11/10/2012  *RADIOLOGY REPORT*  Clinical Data: Preop for lumbar surgery.  LUMBAR SPINE - 2-3 VIEW  Comparison: CT scan 09/30/2011.  Findings: There are pedicle screws, posterior rods and interbody fusion devices at L4-5 and L5-S1.  No complicating features associated the hardware.  There is advanced degenerative disease at L3-4 and mild degenerative disease at L1-2 and L2-3.  The SI joints appear normal.  IMPRESSION: L4-S1 fusion. Degenerative disease at the remaining levels.   Original Report Authenticated By: Rudie Meyer, M.D.    Dg C-arm Gt 120 Min  11/17/2012  *RADIOLOGY REPORT*  Clinical Data: L3-4 fusion.  DG  C-ARM GT 120 MIN, LUMBAR SPINE - 2-3 VIEW  Technique: We are provided two fluoroscopic spot views of the lower lumbar spine.  Comparison:  Plain films lumbar spine 11/10/2012.  Findings: Provided images demonstrate extension of previously seen L4-S1 fusion to include the L3-4 level.  Interbody spacer is in place.  IMPRESSION: Extension of L4-S1 fusion to include the L3-4 level.   Original Report Authenticated By: Holley Dexter, M.D.           Follow-up Information   Call Alvy Beal, MD. (As needed if symptoms worsen)    Contact information:   [address] [place] [address] [phone]       Discharge Plan:  discharge to home   Disposition: stable    Signed: Venita Lick D for Dr. Venita Lick [facility] [phone] 11/19/2012, 7:56 AM

## 2012-11-19 NOTE — Progress Notes (Signed)
Patient discharged in stable condition to home. Discharge instructions and prescriptions were given and explained. 

## 2012-11-22 MED FILL — Sodium Chloride IV Soln 0.9%: INTRAVENOUS | Qty: 1000 | Status: AC

## 2012-11-22 MED FILL — Heparin Sodium (Porcine) Inj 1000 Unit/ML: INTRAMUSCULAR | Qty: 30 | Status: AC

## 2012-11-30 NOTE — Anesthesia Postprocedure Evaluation (Signed)
  Anesthesia Post-op Note  Patient: [name]  Procedure(s) Performed: Procedure(s) with comments: XLIF L3 - L4 POSTERIOR L3 -L4 FUSION INSTRUMENTATION/L2 - L3 DECOMPRESSION (REVISION OF POSTERIOR HARDWARE) 1 LEVEL (N/A) - procedure #1: start 2130, end 1057. procedure #2: start 1126, end 1510 POSTERIOR LUMBAR FUSION 1 WITH HARDWARE REMOVAL LUMBAR THREE-FOUR  Patient Location: PACU  Anesthesia Type:General  Level of Consciousness: awake  Airway and Oxygen Therapy: Patient Spontanous Breathing  Post-op Pain: mild  Post-op Assessment: Post-op Vital signs reviewed  Post-op Vital Signs: stable  Complications: No apparent anesthesia complications

## 2013-01-12 ENCOUNTER — Encounter: Payer: Self-pay | Admitting: Pharmacist Clinician (PhC)/ Clinical Pharmacy Specialist

## 2013-01-18 ENCOUNTER — Encounter: Payer: Self-pay | Admitting: Cardiovascular Disease

## 2013-01-19 ENCOUNTER — Encounter: Payer: Self-pay | Admitting: Cardiovascular Disease

## 2013-01-19 ENCOUNTER — Ambulatory Visit (INDEPENDENT_AMBULATORY_CARE_PROVIDER_SITE_OTHER): Payer: Medicare Other | Admitting: Cardiovascular Disease

## 2013-01-19 VITALS — BP 100/68 | HR 58 | Resp 20 | Ht 74.0 in | Wt 268.0 lb

## 2013-01-19 DIAGNOSIS — J449 Chronic obstructive pulmonary disease, unspecified: Secondary | ICD-10-CM | POA: Insufficient documentation

## 2013-01-19 DIAGNOSIS — F17200 Nicotine dependence, unspecified, uncomplicated: Secondary | ICD-10-CM

## 2013-01-19 DIAGNOSIS — R0602 Shortness of breath: Secondary | ICD-10-CM

## 2013-01-19 DIAGNOSIS — M549 Dorsalgia, unspecified: Secondary | ICD-10-CM

## 2013-01-19 DIAGNOSIS — Z7189 Other specified counseling: Secondary | ICD-10-CM

## 2013-01-19 DIAGNOSIS — Z720 Tobacco use: Secondary | ICD-10-CM | POA: Insufficient documentation

## 2013-01-19 DIAGNOSIS — E669 Obesity, unspecified: Secondary | ICD-10-CM | POA: Insufficient documentation

## 2013-01-19 DIAGNOSIS — Z716 Tobacco abuse counseling: Secondary | ICD-10-CM

## 2013-01-19 DIAGNOSIS — E782 Mixed hyperlipidemia: Secondary | ICD-10-CM | POA: Insufficient documentation

## 2013-01-19 DIAGNOSIS — I10 Essential (primary) hypertension: Secondary | ICD-10-CM | POA: Insufficient documentation

## 2013-01-19 MED ORDER — VARENICLINE TARTRATE 0.5 MG X 11 & 1 MG X 42 PO MISC: ORAL | Status: DC

## 2013-01-19 MED ORDER — VARENICLINE TARTRATE 1 MG PO TABS: 1.0000 mg | ORAL_TABLET | Freq: Two times a day (BID) | ORAL | Status: DC

## 2013-01-19 NOTE — Progress Notes (Signed)
Patient ID: [name], male   DOB: 22-Jun-1956, 57 y.o.   MRN: [id number]   Reason for office visit Coronary risk factor management    Allergies  Allergen Reactions  . Penicillins Anaphylaxis    Current Outpatient Prescriptions  Medication Sig Dispense Refill  . calcium-vitamin D (OSCAL WITH D) 500-200 MG-UNIT per tablet Take 1 tablet by mouth daily.      . Choline Fenofibrate (TRILIPIX) 135 MG capsule Take 135 mg by mouth daily.      . Coenzyme Q10 (COQ10) 150 MG CAPS Take 1 capsule by mouth daily.      [name] docusate sodium (COLACE) 100 MG capsule Take 1 capsule (100 mg total) by mouth 3 (three) times daily as needed for constipation.  30 capsule  0  . enalapril (VASOTEC) 10 MG tablet Take 10 mg by mouth daily.      . Fish Oil-Cholecalciferol (OMEGA-3 FISH OIL-VITAMIN D3 PO) Take 1 capsule by mouth daily.      [name] FLUoxetine HCl 60 MG TABS Take 60 mg by mouth daily.      [name] gabapentin (NEURONTIN) 300 MG capsule Take 600 mg by mouth 2 (two) times daily.      Boris Lown Oil 300 MG CAPS Take 1 capsule by mouth daily.      . methocarbamol (ROBAXIN) 500 MG tablet Take 1 tablet (500 mg total) by mouth 3 (three) times daily as needed.  60 tablet  0  . oxyCODONE-acetaminophen (PERCOCET) 10-325 MG per tablet Take 1 tablet by mouth every 4 (four) hours as needed for pain.  60 tablet  0  . pediatric multivitamin-fluoride (POLY-VI-FLOR) 0.25 MG chewable tablet Chew 1 tablet by mouth daily.      . simvastatin (ZOCOR) 40 MG tablet Take 40 mg by mouth every evening.      . ARIPiprazole (ABILIFY) 5 MG tablet Take 5 mg by mouth at bedtime.      . Doxepin HCl (SILENOR) 6 MG TABS Take 6 mg by mouth at bedtime.      . ondansetron (ZOFRAN) 4 MG tablet Take 1 tablet (4 mg total) by mouth every 8 (eight) hours as needed for nausea.  20 tablet  0  . polyethylene glycol powder (GLYCOLAX) powder Take 17 g by mouth daily.  255 g  1  . varenicline (CHANTIX CONTINUING MONTH PAK) 1 MG tablet Take 1 tablet (1 mg total)  by mouth 2 (two) times daily.  60 tablet  5  . varenicline (CHANTIX STARTING MONTH PAK) 0.5 MG X 11 & 1 MG X 42 tablet Take one 0.5 mg tablet by mouth once daily for 3 days, then increase to one 0.5 mg tablet twice daily for 4 days, then increase to one 1 mg tablet twice daily.  53 tablet  0   No current facility-administered medications for this visit.    Past Medical History  Diagnosis Date  . Cancer 2007    prostate CA  . Hypertension   . Shortness of breath   . Mental disorder   . Anxiety   . Depression   . Urinary incontinence     due to prostate removal  . Hypercholesteremia   . Arthritis   . Sleep apnea     wears CPAP nightly  . Decreased pulse 08/16/2008    LE doppler - bilateral ABIs normal, bilateral PVRs normal waveform; bilateral common iliac arteries difficult to visualize however velocities suggest less than 50% diameter reduction; bilateral LE normal velocities w/o evidence of  diameter reduction  . COPD (chronic obstructive pulmonary disease)   . Tobacco abuse   . Hypertension     Past Surgical History  Procedure Laterality Date  . Back surgery    . Cervical fusion    . Prostate surgery    . Bladder suspension      x 2  . Wrist surgery Right   . Colonoscopy    . Cardiac catheterization      no PCI  . Anterior lat lumbar fusion N/A 11/17/2012    Procedure: XLIF L3 - L4 POSTERIOR L3 -L4 FUSION INSTRUMENTATION/L2 - L3 DECOMPRESSION (REVISION OF POSTERIOR HARDWARE) 1 LEVEL;  Surgeon: Venita Lick, MD;  Location: [facility] OR;  Service: Orthopedics;  Laterality: N/A;  procedure #1: start 0923, end 1057.   . Transthoracic echocardiogram  08/16/2008    essentially normal for age  . Cardiovascular stress test  08/16/2008    EF 57%; inferior region has diaphragmatic attenuation, otherwise normal perfusion w/o evidence of ischemia or infarct; LV normal in size; no scintographic evidence of inducible ischemia; no ECG changes, negative for ischemia; low risk scan    Family  History  Problem Relation Age of Onset  . Diabetes Mother   . Hypertension Mother   . Heart disease Mother   . Diabetes Father   . Hypertension Father   . Hypertension Brother   . Diabetes Brother     History   Social History  . Marital Status: Married    Spouse Name: N/A    Number of Children: N/A  . Years of Education: N/A   Occupational History  . Not on file.   Social History Main Topics  . Smoking status: Current Every Day Smoker -- 1.00 packs/day for 43 years    Types: Cigarettes  . Smokeless tobacco: Never Used  . Alcohol Use: No  . Drug Use: No  . Sexually Active: Not on file   Other Topics Concern  . Not on file   Social History Narrative  . No narrative on file    Review of systems: Daily cough, mild to moderate dyspnea and wheezing are present.  PHYSICAL EXAM BP 100/68  Pulse 58  Resp 20  Ht 6\' 2"  (1.88 m)  Wt 268 lb (121.564 kg)  BMI 34.39 kg/m2  General: Alert, oriented x3, no distress. Obese Head: no evidence of trauma, PERRL, EOMI, no exophtalmos or lid lag, no myxedema, no xanthelasma; normal ears, nose and oropharynx Neck: normal jugular venous pulsations and no hepatojugular reflux; brisk carotid pulses without delay and no carotid bruits Chest: emphysematous shape, bilateral rhonchi and a few wheezes, no signs of consolidation by percussion or palpation, normal fremitus, symmetrical and full respiratory excursions Cardiovascular: normal position and quality of the apical impulse, regular rhythm, normal first and second heart sounds, no murmurs, rubs or gallops Abdomen: no tenderness or distention, no masses by palpation, no abnormal pulsatility or arterial bruits, normal bowel sounds, no hepatosplenomegaly Extremities: no clubbing, cyanosis or edema; 2+ radial, ulnar and brachial pulses bilaterally; 2+ right femoral, posterior tibial and dorsalis pedis pulses; 2+ left femoral, posterior tibial and dorsalis pedis pulses; no subclavian or femoral  bruits Neurological: grossly nonfocal   EKG: Sinus bradycardia, normal  Lipid Panel see below  BMET    Component Value Date/Time   NA 139 11/17/2012 1306   K 4.3 11/17/2012 1306   CL 102 11/10/2012 1333   CO2 27 11/10/2012 1333   GLUCOSE 135* 11/17/2012 1306   BUN 13 11/10/2012 1333  CREATININE 0.85 11/10/2012 1333   CALCIUM 9.6 11/10/2012 1333   GFRNONAA >90 11/10/2012 1333   GFRAA >90 11/10/2012 1333     ASSESSMENT AND PLAN  Tobacco abuse This is the biggest threat to his health. He managed to quit for 4 months while using Chantix, but relapsed in the setting of financial hardship. His wife successfully quit 9 years ago, and his father also quit and are good role models for him. It sounds like he smokes out of boredom and frustration. Spent over 10 minutes counseling on smoking cessation techniques. Reeducated on role of Chantix, potential side effects. Discussed behavioral changes and tips for successful quitting.  Back pain He has severe degenerative disease of the spine at multiple levels that severely limit his ability to exercise.  HTN (hypertension) Good control  Mixed hyperlipidemia Last July: TC 142, HDL-C27, LDL-C93, TG 109. Other than low HDL cholesterol, these are in target range. Expect some improvement in HDL with weight loss. Avoid excessive carbs and saturated fats, increase intake of unsaturated fats.  Obesity (BMI 30-39.9) I would focus on smoking cessation first, but weight loss is an important long term goal   COPD (chronic obstructive pulmonary disease) with chronic bronchitis I don't think he has had formal PFTs, but his history of daily cough and the physical exam support this diagnosis.     Junious Silk, MD, [facility] [phone] office [phone] pager

## 2013-01-19 NOTE — Assessment & Plan Note (Signed)
Last July: TC 142, HDL-C27, LDL-C93, TG 109. Other than low HDL cholesterol, these are in target range. Expect some improvement in HDL with weight loss. Avoid excessive carbs and saturated fats, increase intake of unsaturated fats.

## 2013-01-19 NOTE — Assessment & Plan Note (Signed)
I would focus on smoking cessation first, but weight loss is an important long term goal

## 2013-01-19 NOTE — Assessment & Plan Note (Signed)
Good control

## 2013-01-19 NOTE — Assessment & Plan Note (Signed)
He has severe degenerative disease of the spine at multiple levels that severely limit his ability to exercise.

## 2013-01-19 NOTE — Patient Instructions (Signed)
Stop smoking

## 2013-01-19 NOTE — Assessment & Plan Note (Signed)
I don't think he has had formal PFTs, but his history of daily cough and the physical exam support this diagnosis.

## 2013-01-19 NOTE — Assessment & Plan Note (Signed)
This is the biggest threat to his health. He managed to quit for 4 months while using Chantix, but relapsed in the setting of financial hardship. His wife successfully quit 9 years ago, and his father also quit and are good role models for him. It sounds like he smokes out of boredom and frustration. Spent over 10 minutes counseling on smoking cessation techniques. Reeducated on role of Chantix, potential side effects. Discussed behavioral changes and tips for successful quitting.

## 2013-04-06 ENCOUNTER — Other Ambulatory Visit (HOSPITAL_COMMUNITY): Payer: Self-pay | Admitting: Urology

## 2013-04-06 DIAGNOSIS — C61 Malignant neoplasm of prostate: Secondary | ICD-10-CM

## 2013-04-18 ENCOUNTER — Encounter (HOSPITAL_COMMUNITY)
Admission: RE | Admit: 2013-04-18 | Discharge: 2013-04-18 | Disposition: A | Discharge status: Home / Self Care | Payer: Medicare Other | Source: Ambulatory Visit | Attending: Urology | Admitting: Urology

## 2013-04-18 DIAGNOSIS — C61 Malignant neoplasm of prostate: Secondary | ICD-10-CM | POA: Insufficient documentation

## 2013-04-18 MED ORDER — TECHNETIUM TC 99M MEDRONATE IV KIT
25.0000 | PACK | Freq: Once | INTRAVENOUS | Status: AC | PRN
  Administered 2013-04-18: 25 via INTRAVENOUS

## 2013-04-27 ENCOUNTER — Ambulatory Visit (INDEPENDENT_AMBULATORY_CARE_PROVIDER_SITE_OTHER): Payer: Medicare Other | Admitting: Surgery

## 2013-04-27 ENCOUNTER — Encounter (INDEPENDENT_AMBULATORY_CARE_PROVIDER_SITE_OTHER): Payer: Self-pay | Admitting: Surgery

## 2013-04-27 VITALS — BP 124/78 | HR 72 | Resp 18 | Ht 74.0 in | Wt 274.4 lb

## 2013-04-27 DIAGNOSIS — K639 Disease of intestine, unspecified: Secondary | ICD-10-CM

## 2013-04-27 DIAGNOSIS — K6389 Other specified diseases of intestine: Secondary | ICD-10-CM

## 2013-04-27 NOTE — Progress Notes (Signed)
Patient ID: [name], male   DOB: 1956/03/17, 57 y.o.   MRN: [id number]  No chief complaint on file.   HPI [name] is a 57 y.o. male.  Patient sent at the request of Dr. Laverle Patter for incidental mass noted on CT scan of abdomen and pelvis for prostate cancer followup. The patient has had a rising PSA after robotic prostatectomy in 2007. He had radiation therapy for a rising PSA to the tumor bed in 2009. CT scan was obtained for arising PSA. Denies any abdominal pain, nausea, vomiting, diarrhea, or blood in his stool. He does have hemorrhoids.  HPI  Past Medical History  Diagnosis Date  . Cancer 2007    prostate CA  . Hypertension   . Shortness of breath   . Mental disorder   . Anxiety   . Depression   . Urinary incontinence     due to prostate removal  . Hypercholesteremia   . Arthritis   . Sleep apnea     wears CPAP nightly  . Decreased pulse 08/16/2008    LE doppler - bilateral ABIs normal, bilateral PVRs normal waveform; bilateral common iliac arteries difficult to visualize however velocities suggest less than 50% diameter reduction; bilateral LE normal velocities w/o evidence of diameter reduction  . COPD (chronic obstructive pulmonary disease)   . Tobacco abuse   . Hypertension     Past Surgical History  Procedure Laterality Date  . Back surgery    . Cervical fusion    . Prostate surgery    . Bladder suspension      x 2  . Wrist surgery Right   . Colonoscopy    . Cardiac catheterization      no PCI  . Anterior lat lumbar fusion N/A 11/17/2012    Procedure: XLIF L3 - L4 POSTERIOR L3 -L4 FUSION INSTRUMENTATION/L2 - L3 DECOMPRESSION (REVISION OF POSTERIOR HARDWARE) 1 LEVEL;  Surgeon: Venita Lick, MD;  Location: [facility] OR;  Service: Orthopedics;  Laterality: N/A;  procedure #1: start 0923, end 1057.   . Transthoracic echocardiogram  08/16/2008    essentially normal for age  . Cardiovascular stress test  08/16/2008    EF 57%; inferior region has diaphragmatic  attenuation, otherwise normal perfusion w/o evidence of ischemia or infarct; LV normal in size; no scintographic evidence of inducible ischemia; no ECG changes, negative for ischemia; low risk scan    Family History  Problem Relation Age of Onset  . Diabetes Mother   . Hypertension Mother   . Heart disease Mother   . Diabetes Father   . Hypertension Father   . Hypertension Brother   . Diabetes Brother     Social History History  Substance Use Topics  . Smoking status: Current Every Day Smoker -- 1.00 packs/day for 43 years    Types: Cigarettes  . Smokeless tobacco: Never Used  . Alcohol Use: No    Allergies  Allergen Reactions  . Penicillins Anaphylaxis    Current Outpatient Prescriptions  Medication Sig Dispense Refill  . ARIPiprazole (ABILIFY) 5 MG tablet Take 5 mg by mouth at bedtime.      . calcium-vitamin D (OSCAL WITH D) 500-200 MG-UNIT per tablet Take 1 tablet by mouth daily.      . Choline Fenofibrate (TRILIPIX) 135 MG capsule Take 135 mg by mouth daily.      . Coenzyme Q10 (COQ10) 150 MG CAPS Take 1 capsule by mouth daily.      [name] docusate sodium (COLACE) 100  MG capsule Take 1 capsule (100 mg total) by mouth 3 (three) times daily as needed for constipation.  30 capsule  0  . Doxepin HCl (SILENOR) 6 MG TABS Take 6 mg by mouth at bedtime.      . enalapril (VASOTEC) 10 MG tablet Take 10 mg by mouth daily.      . Fish Oil-Cholecalciferol (OMEGA-3 FISH OIL-VITAMIN D3 PO) Take 1 capsule by mouth daily.      [name] FLUoxetine HCl 60 MG TABS Take 60 mg by mouth daily.      [name] gabapentin (NEURONTIN) 300 MG capsule Take 600 mg by mouth 2 (two) times daily.      [name] HYDROcodone-acetaminophen (NORCO/VICODIN) 5-325 MG per tablet Take 1 tablet by mouth every 6 (six) hours as needed for pain.      Boris Lown Oil 300 MG CAPS Take 1 capsule by mouth daily.      . methocarbamol (ROBAXIN) 500 MG tablet Take 1 tablet (500 mg total) by mouth 3 (three) times daily as needed.  60 tablet  0  .  ondansetron (ZOFRAN) 4 MG tablet Take 1 tablet (4 mg total) by mouth every 8 (eight) hours as needed for nausea.  20 tablet  0  . pediatric multivitamin-fluoride (POLY-VI-FLOR) 0.25 MG chewable tablet Chew 1 tablet by mouth daily.      . polyethylene glycol powder (GLYCOLAX) powder Take 17 g by mouth daily.  255 g  1  . simvastatin (ZOCOR) 40 MG tablet Take 40 mg by mouth every evening.      . varenicline (CHANTIX CONTINUING MONTH PAK) 1 MG tablet Take 1 tablet (1 mg total) by mouth 2 (two) times daily.  60 tablet  5  . varenicline (CHANTIX STARTING MONTH PAK) 0.5 MG X 11 & 1 MG X 42 tablet Take one 0.5 mg tablet by mouth once daily for 3 days, then increase to one 0.5 mg tablet twice daily for 4 days, then increase to one 1 mg tablet twice daily.  53 tablet  0   No current facility-administered medications for this visit.    Review of Systems Review of Systems  Constitutional: Negative.   HENT: Negative.   Eyes: Negative.   Respiratory: Negative.   Cardiovascular: Negative.   Gastrointestinal: Negative.   Endocrine: Negative.   Genitourinary: Negative.   Musculoskeletal: Negative.   Allergic/Immunologic: Negative.   Neurological: Negative.   Hematological: Negative.   Psychiatric/Behavioral: Negative.     Blood pressure 124/78, pulse 72, resp. rate 18, height 6\' 2"  (1.88 m), weight 274 lb 6.4 oz (124.467 kg).  Physical Exam Physical Exam  Constitutional: He is oriented to person, place, and time. He appears well-developed and well-nourished.  HENT:  Head: Normocephalic and atraumatic.  Eyes: EOM are normal. Pupils are equal, round, and reactive to light.  Neck: Normal range of motion. Neck supple.  Cardiovascular: Normal rate and regular rhythm.   Pulmonary/Chest: Effort normal and breath sounds normal.  Abdominal: Soft. Bowel sounds are normal. He exhibits no distension. There is no tenderness.    Musculoskeletal: Normal range of motion.  Neurological: He is alert and  oriented to person, place, and time.  Skin: Skin is warm and dry.  Psychiatric: He has a normal mood and affect. His behavior is normal. Judgment and thought content normal.    Data Reviewed [facility]  Assessment    Mass involving the cecum/appendix noted incidentally on CT scan of abdomen pelvis for prostate cancer followup    Plan  Recommend colonoscopy since it has been 7-8 years since the last colonoscopy given the findings on CT scan. He will need laparoscopy after this is done. This will help plan surgical procedure as well. Return to clinic after colonoscopy done.        Shuronda Santino A. 04/27/2013, 4:06 PM

## 2013-04-27 NOTE — Patient Instructions (Signed)
Refer to GI medicine for colonoscopy. Return after.

## 2013-04-28 ENCOUNTER — Telehealth (INDEPENDENT_AMBULATORY_CARE_PROVIDER_SITE_OTHER): Payer: Self-pay | Admitting: General Surgery

## 2013-04-28 NOTE — Telephone Encounter (Signed)
LMOM letting pt know he has an appt w/ Frederich Chick, NP with [facility] on 05/25/13 at 9:15.  Address and phone number were given.

## 2013-05-23 ENCOUNTER — Encounter (INDEPENDENT_AMBULATORY_CARE_PROVIDER_SITE_OTHER): Payer: Medicare Other | Admitting: Surgery

## 2013-05-25 ENCOUNTER — Encounter: Payer: Self-pay | Admitting: Gastroenterology

## 2013-05-25 ENCOUNTER — Ambulatory Visit (INDEPENDENT_AMBULATORY_CARE_PROVIDER_SITE_OTHER): Payer: Medicare Other | Admitting: Gastroenterology

## 2013-05-25 VITALS — BP 130/75 | HR 56 | Temp 98.3°F | Ht 74.0 in | Wt 281.0 lb

## 2013-05-25 DIAGNOSIS — R1314 Dysphagia, pharyngoesophageal phase: Secondary | ICD-10-CM

## 2013-05-25 DIAGNOSIS — K389 Disease of appendix, unspecified: Secondary | ICD-10-CM

## 2013-05-25 DIAGNOSIS — R1310 Dysphagia, unspecified: Secondary | ICD-10-CM | POA: Insufficient documentation

## 2013-05-25 DIAGNOSIS — K6389 Other specified diseases of intestine: Secondary | ICD-10-CM | POA: Insufficient documentation

## 2013-05-25 DIAGNOSIS — K388 Other specified diseases of appendix: Secondary | ICD-10-CM | POA: Insufficient documentation

## 2013-05-25 MED ORDER — PEG 3350-KCL-NA BICARB-NACL 420 G PO SOLR: 4000.0000 mL | ORAL | Status: DC

## 2013-05-25 NOTE — Progress Notes (Signed)
Primary Care Physician:  Cassell Smiles., MD  Primary Gastroenterologist:  Roetta Sessions, MD    Chief Complaint  Patient presents with  . Colonoscopy    HPI:  [name] is a 57 y.o. male here at the request of Dr. Maisie Fus Cornett to schedule colonoscopy. Patient has a history of prostate cancer followed by Dr. Crecencio Mc. In 2007 he underwent a radical prostatectomy but had positive margins. Patient states he had external radiation therapy in 2010. Recently had rising PSA level. This prompted a bone scan in August which was stable with no evidence of metastatic disease. He underwent a CT scan which showed an abnormality around the cecum felt to be at the site of the appendix, Small appendiceal mass, lymphadenopathy, or coiled up appendix not excluded. Patient was referred to Dr. Luisa Hart for consideration of surgery but he is now requesting colonoscopy prior to laparoscopy.   CT images viewable in the PACS system, impression noted below.   CT A/P with CM, 04/18/13 IMPRESSION: 1. An irregular rounded density adjacent to the cecum probably represents a coiled up appendix. Pericecal adenopathy cannot be excluded. 2. No compelling findings for osseous metastatic disease. 3. Nodular density adjacent to the spleen is less dense than the splenic tissue, and potentially a lymph node rather than splenule. Similar size compared to prior. 4. Small left inguinal hernia contains adipose tissue.  Patient reports occasional constipation with pain medication. Takes stool softners to help. Occasional RLQ pain. No vomiting. No heartburn. Some solid food dysphagia. No pill dysphagia. No melena or rectal bleeding.  Current Outpatient Prescriptions  Medication Sig Dispense Refill  . ARIPiprazole (ABILIFY) 5 MG tablet Take 5 mg by mouth at bedtime.      . calcium-vitamin D (OSCAL WITH D) 500-200 MG-UNIT per tablet Take 1 tablet by mouth daily.      . Choline Fenofibrate (TRILIPIX) 135 MG capsule Take  135 mg by mouth daily.      . Coenzyme Q10 (COQ10) 150 MG CAPS Take 1 capsule by mouth daily.      [name] docusate sodium (COLACE) 100 MG capsule Take 1 capsule (100 mg total) by mouth 3 (three) times daily as needed for constipation.  30 capsule  0  . Doxepin HCl (SILENOR) 6 MG TABS Take 6 mg by mouth at bedtime.      . enalapril (VASOTEC) 10 MG tablet Take 10 mg by mouth daily.      . Fish Oil-Cholecalciferol (OMEGA-3 FISH OIL-VITAMIN D3 PO) Take 1 capsule by mouth daily.      [name] FLUoxetine HCl 60 MG TABS Take 60 mg by mouth daily.      [name] gabapentin (NEURONTIN) 300 MG capsule Take 600 mg by mouth 2 (two) times daily.      [name] HYDROcodone-acetaminophen (NORCO/VICODIN) 5-325 MG per tablet Take 1 tablet by mouth every 6 (six) hours as needed for pain.      Boris Lown Oil 300 MG CAPS Take 1 capsule by mouth daily.      . methocarbamol (ROBAXIN) 500 MG tablet Take 1 tablet (500 mg total) by mouth 3 (three) times daily as needed.  60 tablet  0  . ondansetron (ZOFRAN) 4 MG tablet Take 1 tablet (4 mg total) by mouth every 8 (eight) hours as needed for nausea.  20 tablet  0  . pediatric multivitamin-fluoride (POLY-VI-FLOR) 0.25 MG chewable tablet Chew 1 tablet by mouth daily.      . polyethylene glycol powder (GLYCOLAX) powder Take 17 g by  mouth daily.  255 g  1  . simvastatin (ZOCOR) 40 MG tablet Take 40 mg by mouth every evening.      . varenicline (CHANTIX CONTINUING MONTH PAK) 1 MG tablet Take 1 tablet (1 mg total) by mouth 2 (two) times daily.  60 tablet  5  . varenicline (CHANTIX STARTING MONTH PAK) 0.5 MG X 11 & 1 MG X 42 tablet Take one 0.5 mg tablet by mouth once daily for 3 days, then increase to one 0.5 mg tablet twice daily for 4 days, then increase to one 1 mg tablet twice daily.  53 tablet  0   No current facility-administered medications for this visit.    Allergies as of 05/25/2013 - Review Complete 05/25/2013  Allergen Reaction Noted  . Penicillins Anaphylaxis 09/30/2011    Past Medical  History  Diagnosis Date  . Cancer 2007    prostate CA  . Hypertension   . Shortness of breath   . Mental disorder   . Anxiety   . Depression   . Urinary incontinence     due to prostate removal  . Hypercholesteremia   . Arthritis   . Sleep apnea     wears CPAP nightly  . Decreased pulse 08/16/2008    LE doppler - bilateral ABIs normal, bilateral PVRs normal waveform; bilateral common iliac arteries difficult to visualize however velocities suggest less than 50% diameter reduction; bilateral LE normal velocities w/o evidence of diameter reduction  . COPD (chronic obstructive pulmonary disease)   . Tobacco abuse     Past Surgical History  Procedure Laterality Date  . Back surgery    . Cervical fusion    . Prostate surgery    . Bladder suspension      x 2  . Wrist surgery Right   . Colonoscopy    . Cardiac catheterization      no PCI  . Anterior lat lumbar fusion N/A 11/17/2012    Procedure: XLIF L3 - L4 POSTERIOR L3 -L4 FUSION INSTRUMENTATION/L2 - L3 DECOMPRESSION (REVISION OF POSTERIOR HARDWARE) 1 LEVEL;  Surgeon: Venita Lick, MD;  Location: [facility] OR;  Service: Orthopedics;  Laterality: N/A;  procedure #1: start 0923, end 1057.   . Transthoracic echocardiogram  08/16/2008    essentially normal for age  . Cardiovascular stress test  08/16/2008    EF 57%; inferior region has diaphragmatic attenuation, otherwise normal perfusion w/o evidence of ischemia or infarct; LV normal in size; no scintographic evidence of inducible ischemia; no ECG changes, negative for ischemia; low risk scan    Family History  Problem Relation Age of Onset  . Diabetes Mother   . Hypertension Mother   . Heart disease Mother   . Diabetes Father   . Hypertension Father   . Hypertension Brother   . Diabetes Brother   . Colon cancer Neg Hx   . Colon polyps Father     age 36    History   Social History  . Marital Status: Married    Spouse Name: N/A    Number of Children: N/A  . Years of  Education: N/A   Occupational History  . Not on file.   Social History Main Topics  . Smoking status: Former Smoker -- 1.00 packs/day for 43 years    Types: Cigarettes  . Smokeless tobacco: Never Used     Comment: 02/28/13 quit  . Alcohol Use: No  . Drug Use: No  . Sexual Activity: Not on file   Other Topics  Concern  . Not on file   Social History Narrative  . No narrative on file      ROS:  General: Negative for anorexia, weight loss, fever, chills, fatigue, weakness. Eyes: Negative for vision changes.  ENT: Negative for hoarseness, difficulty swallowing , nasal congestion. CV: Negative for chest pain, angina, palpitations, dyspnea on exertion, peripheral edema.  Respiratory: Negative for dyspnea at rest, dyspnea on exertion, cough, sputum, wheezing.  GI: See history of present illness. GU:  Negative for dysuria, hematuria, urinary incontinence, urinary frequency, nocturnal urination.  MS: Negative for joint pain, low back pain.  Derm: Negative for rash or itching.  Neuro: Negative for weakness, abnormal sensation, seizure, frequent headaches, memory loss, confusion.  Psych: Negative for anxiety, depression, suicidal ideation, hallucinations.  Endo: Negative for unusual weight change.  Heme: Negative for bruising or bleeding. Allergy: Negative for rash or hives.    Physical Examination:  BP 130/75  Pulse 56  Temp(Src) 98.3 F (36.8 C) (Oral)  Ht 6\' 2"  (1.88 m)  Wt 281 lb (127.461 kg)  BMI 36.06 kg/m2   General: Well-nourished, well-developed in no acute distress. Accompanied by wife. Head: Normocephalic, atraumatic.   Eyes: Conjunctiva pink, no icterus. Mouth: Oropharyngeal mucosa moist and pink , no lesions erythema or exudate. Neck: Supple without thyromegaly, masses, or lymphadenopathy.  Lungs: Clear to auscultation bilaterally.  Heart: Regular rate and rhythm, no murmurs rubs or gallops.  Abdomen: Bowel sounds are normal, nontender, nondistended, no  hepatosplenomegaly or masses, no abdominal bruits or    hernia , no rebound or guarding.   Rectal: Not performed. Extremities: No lower extremity edema. No clubbing or deformities.  Neuro: Alert and oriented x 4 , grossly normal neurologically.  Skin: Warm and dry, no rash or jaundice.   Psych: Alert and cooperative, normal mood and affect.

## 2013-05-25 NOTE — Assessment & Plan Note (Signed)
57 year old gentleman who presents to schedule a colonoscopy at request of Dr. Luisa Hart. Patient recently had an abnormal CT scan which was done to further evaluate rising PSA and to rule out metastatic disease. He was found to have a 3.5 x 1.8 cm soft tissue density adjacent to the cecum is or the location which would be occupied by the appendix. A small appendiceal mass versus coiled up appendix versus pericecal adenopathy not excluded. Prior to consideration of laparoscopy we've been asked to perform a colonoscopy to rule out any other colonic issues. Patient's last colonoscopy reportedly about 8 years ago. A long discussion the patient today regarding forms of sedation. He very much would prefer conscious sedation even with his history of polypharmacy. He is aware there is a small chance that he will have an adequate sedation requiring [place] to recovery been have this done at another time with monitored anesthesia care. He'll receive Phenergan 25 mg IV 30 minutes prior to procedure.  I have discussed the risks, alternatives, benefits with regards to but not limited to the risk of reaction to medication, bleeding, infection, perforation and the patient is agreeable to proceed. Written consent to be obtained.

## 2013-05-25 NOTE — Assessment & Plan Note (Signed)
Solid food dysphagia without other upper GI symptoms. No typical heartburn. Plan on upper endoscopy with dilation at time of colonoscopy.  I have discussed the risks, alternatives, benefits with regards to but not limited to the risk of reaction to medication, bleeding, infection, perforation and the patient is agreeable to proceed. Written consent to be obtained.

## 2013-05-25 NOTE — Patient Instructions (Addendum)
We have scheduled you for an upper endoscopy and colonoscopy. See separate instructions.

## 2013-05-26 NOTE — Progress Notes (Signed)
CC'd to PCP 

## 2013-05-27 ENCOUNTER — Encounter (HOSPITAL_COMMUNITY): Payer: Self-pay | Admitting: *Deleted

## 2013-05-27 ENCOUNTER — Encounter (HOSPITAL_COMMUNITY)
Admission: RE | Disposition: A | Discharge status: Home / Self Care | Payer: Self-pay | Source: Ambulatory Visit | Attending: Internal Medicine

## 2013-05-27 ENCOUNTER — Ambulatory Visit (HOSPITAL_COMMUNITY)
Admission: RE | Admit: 2013-05-27 | Discharge: 2013-05-27 | Disposition: A | Discharge status: Home / Self Care | Payer: Medicare Other | Source: Ambulatory Visit | Attending: Internal Medicine | Admitting: Internal Medicine

## 2013-05-27 DIAGNOSIS — K210 Gastro-esophageal reflux disease with esophagitis: Secondary | ICD-10-CM

## 2013-05-27 DIAGNOSIS — K222 Esophageal obstruction: Secondary | ICD-10-CM | POA: Insufficient documentation

## 2013-05-27 DIAGNOSIS — J449 Chronic obstructive pulmonary disease, unspecified: Secondary | ICD-10-CM | POA: Insufficient documentation

## 2013-05-27 DIAGNOSIS — R933 Abnormal findings on diagnostic imaging of other parts of digestive tract: Secondary | ICD-10-CM

## 2013-05-27 DIAGNOSIS — R1310 Dysphagia, unspecified: Secondary | ICD-10-CM

## 2013-05-27 DIAGNOSIS — K388 Other specified diseases of appendix: Secondary | ICD-10-CM

## 2013-05-27 DIAGNOSIS — I10 Essential (primary) hypertension: Secondary | ICD-10-CM | POA: Insufficient documentation

## 2013-05-27 DIAGNOSIS — K5730 Diverticulosis of large intestine without perforation or abscess without bleeding: Secondary | ICD-10-CM

## 2013-05-27 DIAGNOSIS — K6389 Other specified diseases of intestine: Secondary | ICD-10-CM

## 2013-05-27 DIAGNOSIS — J4489 Other specified chronic obstructive pulmonary disease: Secondary | ICD-10-CM | POA: Insufficient documentation

## 2013-05-27 DIAGNOSIS — D126 Benign neoplasm of colon, unspecified: Secondary | ICD-10-CM

## 2013-05-27 DIAGNOSIS — K319 Disease of stomach and duodenum, unspecified: Secondary | ICD-10-CM | POA: Insufficient documentation

## 2013-05-27 HISTORY — PX: COLONOSCOPY: SHX5424

## 2013-05-27 HISTORY — PX: ESOPHAGOGASTRODUODENOSCOPY (EGD) WITH ESOPHAGEAL DILATION: SHX5812

## 2013-05-27 SURGERY — COLONOSCOPY
Anesthesia: Moderate Sedation

## 2013-05-27 MED ORDER — STERILE WATER FOR IRRIGATION IR SOLN
Status: DC | PRN
  Administered 2013-05-27: 14:00:00

## 2013-05-27 MED ORDER — SODIUM CHLORIDE 0.9 % IJ SOLN
INTRAMUSCULAR | Status: AC
  Filled 2013-05-27: qty 10

## 2013-05-27 MED ORDER — MIDAZOLAM HCL 5 MG/5ML IJ SOLN
INTRAMUSCULAR | Status: AC
  Filled 2013-05-27: qty 10

## 2013-05-27 MED ORDER — MEPERIDINE HCL 100 MG/ML IJ SOLN
INTRAMUSCULAR | Status: AC
  Filled 2013-05-27: qty 2

## 2013-05-27 MED ORDER — ONDANSETRON HCL 4 MG/2ML IJ SOLN
INTRAMUSCULAR | Status: AC
  Filled 2013-05-27: qty 2

## 2013-05-27 MED ORDER — BUTAMBEN-TETRACAINE-BENZOCAINE 2-2-14 % EX AERO
INHALATION_SPRAY | CUTANEOUS | Status: DC | PRN
  Administered 2013-05-27: 2 via TOPICAL

## 2013-05-27 MED ORDER — MIDAZOLAM HCL 5 MG/5ML IJ SOLN
INTRAMUSCULAR | Status: DC | PRN
  Administered 2013-05-27 (×3): 2 mg via INTRAVENOUS

## 2013-05-27 MED ORDER — PROMETHAZINE HCL 25 MG/ML IJ SOLN
25.0000 mg | Freq: Once | INTRAMUSCULAR | Status: AC
  Administered 2013-05-27: 25 mg via INTRAVENOUS
  Filled 2013-05-27: qty 1

## 2013-05-27 MED ORDER — MEPERIDINE HCL 100 MG/ML IJ SOLN
INTRAMUSCULAR | Status: DC | PRN
  Administered 2013-05-27: 50 mg via INTRAVENOUS
  Administered 2013-05-27: 25 mg via INTRAVENOUS
  Administered 2013-05-27: 50 mg via INTRAVENOUS

## 2013-05-27 MED ORDER — SODIUM CHLORIDE 0.9 % IV SOLN
INTRAVENOUS | Status: DC
  Administered 2013-05-27: 1000 mL via INTRAVENOUS

## 2013-05-27 NOTE — Interval H&P Note (Signed)
History and Physical Interval Note:  05/27/2013 1:51 PM  [name]  has presented today for surgery, with the diagnosis of MASS OF APPENDIX, COLON MASS AND DYSPHAGIA  The various methods of treatment have been discussed with the patient and family. After consideration of risks, benefits and other options for treatment, the patient has consented to  Procedure(s) with comments: COLONOSCOPY (N/A) - 1:15 ESOPHAGOGASTRODUODENOSCOPY (EGD) WITH ESOPHAGEAL DILATION (N/A) as a surgical intervention .  The patient's history has been reviewed, patient examined, no change in status, stable for surgery.  I have reviewed the patient's chart and labs.  Questions were answered to the patient's satisfaction.     [name]  No change. EGD with possible esophageal dilation and colonoscopy per plan.  The risks, benefits, limitations, imponderables and alternatives regarding both EGD and colonoscopy have been reviewed with the patient. Questions have been answered. All parties agreeable.

## 2013-05-27 NOTE — Op Note (Signed)
[facility] [address] [place], [address]   ENDOSCOPY PROCEDURE REPORT  PATIENT: [name], [name]  MR#: [id number] BIRTHDATE: Oct 02, 1955 , 57  yrs. old GENDER: Male ENDOSCOPIST: R.  Roetta Sessions, MD FACP FACG REFERRED BY:  Artis Delay, M.D.  Harriette Bouillon, M.D. PROCEDURE DATE:  05/27/2013 PROCEDURE:     EGD with Elease Hashimoto dilation followed by gastric biopsy  INDICATIONS:     esophageal dysphagia; GERD  INFORMED CONSENT:   The risks, benefits, limitations, alternatives and imponderables have been discussed.  The potential for biopsy, esophogeal dilation, etc. have also been reviewed.  Questions have been answered.  All parties agreeable.  Please see the history and physical in the medical record for more information.  MEDICATIONS: Versed 4 mg IV and Demerol 100 mg IV in divided doses. Cetacaine spray. Phenergan. 25 mg IV and Zofran 4 mg IV.  DESCRIPTION OF PROCEDURE:   The EG-[id number] ([id number])  endoscope was introduced through the mouth and advanced to the second portion of the duodenum without difficulty or limitations.  The mucosal surfaces were surveyed very carefully during advancement of the scope and upon withdrawal.  Retroflexion view of the proximal stomach and esophagogastric junction was performed.      FINDINGS:  Distal esophageal erosions with soft short benign-appearing noncritical peptic stricture. No Barrett's esophagus. No tumor. Stomach empty. Small hiatal hernia. Antral erosions are No ulcer or infiltrating process. Patent pylorus. Normal first and second portion of the duodenum the  THERAPEUTIC / DIAGNOSTIC MANEUVERS PERFORMED:  A 56 French Maloney dilators passed to full insertion easily. A look back revealed of the stricture had been disrupted with minimal bleeding and without apparent complication.  Subsequently, the abnormal antral mucosa was biopsied for histologic study   COMPLICATIONS:  None  IMPRESSION:   Mild erosive  reflux esophagitis with soft non-critical appearing stricture status post Maloney dilation. Hiatal hernia. Antral erosions of uncertain significance-status post biopsy  RECOMMENDATIONS:   Begin Protonix 40 mg daily. Followup on pathology. See colonoscopy report.    _______________________________ R. Roetta Sessions, MD FACP [facility] eSigned:  R. Roetta Sessions, MD FACP [facility] 05/27/2013 2:28 PM     CC:  PATIENT NAME:  [name], [name] MR#: [id number]

## 2013-05-27 NOTE — H&P (View-Only) (Signed)
Primary Care Physician:  FUSCO,LAWRENCE J., MD  Primary Gastroenterologist:  Michael Rourk, MD    Chief Complaint  Patient presents with  . Colonoscopy    HPI:  [name] is a 57 y.o. male here at the request of Dr. Thomas Cornett to schedule colonoscopy. Patient has a history of prostate cancer followed by Dr. Les Borden. In 2007 he underwent a radical prostatectomy but had positive margins. Patient states he had external radiation therapy in 2010. Recently had rising PSA level. This prompted a bone scan in August which was stable with no evidence of metastatic disease. He underwent a CT scan which showed an abnormality around the cecum felt to be at the site of the appendix, Small appendiceal mass, lymphadenopathy, or coiled up appendix not excluded. Patient was referred to Dr. Cornett for consideration of surgery but he is now requesting colonoscopy prior to laparoscopy.   CT images viewable in the PACS system, impression noted below.   CT A/P with CM, 04/18/13 IMPRESSION: 1. An irregular rounded density adjacent to the cecum probably represents a coiled up appendix. Pericecal adenopathy cannot be excluded. 2. No compelling findings for osseous metastatic disease. 3. Nodular density adjacent to the spleen is less dense than the splenic tissue, and potentially a lymph node rather than splenule. Similar size compared to prior. 4. Small left inguinal hernia contains adipose tissue.  Patient reports occasional constipation with pain medication. Takes stool softners to help. Occasional RLQ pain. No vomiting. No heartburn. Some solid food dysphagia. No pill dysphagia. No melena or rectal bleeding.  Current Outpatient Prescriptions  Medication Sig Dispense Refill  . ARIPiprazole (ABILIFY) 5 MG tablet Take 5 mg by mouth at bedtime.      . calcium-vitamin D (OSCAL WITH D) 500-200 MG-UNIT per tablet Take 1 tablet by mouth daily.      . Choline Fenofibrate (TRILIPIX) 135 MG capsule Take  135 mg by mouth daily.      . Coenzyme Q10 (COQ10) 150 MG CAPS Take 1 capsule by mouth daily.      . docusate sodium (COLACE) 100 MG capsule Take 1 capsule (100 mg total) by mouth 3 (three) times daily as needed for constipation.  30 capsule  0  . Doxepin HCl (SILENOR) 6 MG TABS Take 6 mg by mouth at bedtime.      . enalapril (VASOTEC) 10 MG tablet Take 10 mg by mouth daily.      . Fish Oil-Cholecalciferol (OMEGA-3 FISH OIL-VITAMIN D3 PO) Take 1 capsule by mouth daily.      . FLUoxetine HCl 60 MG TABS Take 60 mg by mouth daily.      . gabapentin (NEURONTIN) 300 MG capsule Take 600 mg by mouth 2 (two) times daily.      . HYDROcodone-acetaminophen (NORCO/VICODIN) 5-325 MG per tablet Take 1 tablet by mouth every 6 (six) hours as needed for pain.      . Krill Oil 300 MG CAPS Take 1 capsule by mouth daily.      . methocarbamol (ROBAXIN) 500 MG tablet Take 1 tablet (500 mg total) by mouth 3 (three) times daily as needed.  60 tablet  0  . ondansetron (ZOFRAN) 4 MG tablet Take 1 tablet (4 mg total) by mouth every 8 (eight) hours as needed for nausea.  20 tablet  0  . pediatric multivitamin-fluoride (POLY-VI-FLOR) 0.25 MG chewable tablet Chew 1 tablet by mouth daily.      . polyethylene glycol powder (GLYCOLAX) powder Take 17 g by   mouth daily.  255 g  1  . simvastatin (ZOCOR) 40 MG tablet Take 40 mg by mouth every evening.      . varenicline (CHANTIX CONTINUING MONTH PAK) 1 MG tablet Take 1 tablet (1 mg total) by mouth 2 (two) times daily.  60 tablet  5  . varenicline (CHANTIX STARTING MONTH PAK) 0.5 MG X 11 & 1 MG X 42 tablet Take one 0.5 mg tablet by mouth once daily for 3 days, then increase to one 0.5 mg tablet twice daily for 4 days, then increase to one 1 mg tablet twice daily.  53 tablet  0   No current facility-administered medications for this visit.    Allergies as of 05/25/2013 - Review Complete 05/25/2013  Allergen Reaction Noted  . Penicillins Anaphylaxis 09/30/2011    Past Medical  History  Diagnosis Date  . Cancer 2007    prostate CA  . Hypertension   . Shortness of breath   . Mental disorder   . Anxiety   . Depression   . Urinary incontinence     due to prostate removal  . Hypercholesteremia   . Arthritis   . Sleep apnea     wears CPAP nightly  . Decreased pulse 08/16/2008    LE doppler - bilateral ABIs normal, bilateral PVRs normal waveform; bilateral common iliac arteries difficult to visualize however velocities suggest less than 50% diameter reduction; bilateral LE normal velocities w/o evidence of diameter reduction  . COPD (chronic obstructive pulmonary disease)   . Tobacco abuse     Past Surgical History  Procedure Laterality Date  . Back surgery    . Cervical fusion    . Prostate surgery    . Bladder suspension      x 2  . Wrist surgery Right   . Colonoscopy    . Cardiac catheterization      no PCI  . Anterior lat lumbar fusion N/A 11/17/2012    Procedure: XLIF L3 - L4 POSTERIOR L3 -L4 FUSION INSTRUMENTATION/L2 - L3 DECOMPRESSION (REVISION OF POSTERIOR HARDWARE) 1 LEVEL;  Surgeon: Dahari Brooks, MD;  Location: [facility] OR;  Service: Orthopedics;  Laterality: N/A;  procedure #1: start 0923, end 1057.   . Transthoracic echocardiogram  08/16/2008    essentially normal for age  . Cardiovascular stress test  08/16/2008    EF 57%; inferior region has diaphragmatic attenuation, otherwise normal perfusion w/o evidence of ischemia or infarct; LV normal in size; no scintographic evidence of inducible ischemia; no ECG changes, negative for ischemia; low risk scan    Family History  Problem Relation Age of Onset  . Diabetes Mother   . Hypertension Mother   . Heart disease Mother   . Diabetes Father   . Hypertension Father   . Hypertension Brother   . Diabetes Brother   . Colon cancer Neg Hx   . Colon polyps Father     age 60    History   Social History  . Marital Status: Married    Spouse Name: N/A    Number of Children: N/A  . Years of  Education: N/A   Occupational History  . Not on file.   Social History Main Topics  . Smoking status: Former Smoker -- 1.00 packs/day for 43 years    Types: Cigarettes  . Smokeless tobacco: Never Used     Comment: 02/28/13 quit  . Alcohol Use: No  . Drug Use: No  . Sexual Activity: Not on file   Other Topics   Concern  . Not on file   Social History Narrative  . No narrative on file      ROS:  General: Negative for anorexia, weight loss, fever, chills, fatigue, weakness. Eyes: Negative for vision changes.  ENT: Negative for hoarseness, difficulty swallowing , nasal congestion. CV: Negative for chest pain, angina, palpitations, dyspnea on exertion, peripheral edema.  Respiratory: Negative for dyspnea at rest, dyspnea on exertion, cough, sputum, wheezing.  GI: See history of present illness. GU:  Negative for dysuria, hematuria, urinary incontinence, urinary frequency, nocturnal urination.  MS: Negative for joint pain, low back pain.  Derm: Negative for rash or itching.  Neuro: Negative for weakness, abnormal sensation, seizure, frequent headaches, memory loss, confusion.  Psych: Negative for anxiety, depression, suicidal ideation, hallucinations.  Endo: Negative for unusual weight change.  Heme: Negative for bruising or bleeding. Allergy: Negative for rash or hives.    Physical Examination:  BP 130/75  Pulse 56  Temp(Src) 98.3 F (36.8 C) (Oral)  Ht 6' 2" (1.88 m)  Wt 281 lb (127.461 kg)  BMI 36.06 kg/m2   General: Well-nourished, well-developed in no acute distress. Accompanied by wife. Head: Normocephalic, atraumatic.   Eyes: Conjunctiva pink, no icterus. Mouth: Oropharyngeal mucosa moist and pink , no lesions erythema or exudate. Neck: Supple without thyromegaly, masses, or lymphadenopathy.  Lungs: Clear to auscultation bilaterally.  Heart: Regular rate and rhythm, no murmurs rubs or gallops.  Abdomen: Bowel sounds are normal, nontender, nondistended, no  hepatosplenomegaly or masses, no abdominal bruits or    hernia , no rebound or guarding.   Rectal: Not performed. Extremities: No lower extremity edema. No clubbing or deformities.  Neuro: Alert and oriented x 4 , grossly normal neurologically.  Skin: Warm and dry, no rash or jaundice.   Psych: Alert and cooperative, normal mood and affect.       

## 2013-05-27 NOTE — Op Note (Signed)
[facility] [address] [place], [address]   COLONOSCOPY PROCEDURE REPORT  PATIENT: [name], [name]  MR#:         [id number] BIRTHDATE: 1955-09-12 , 57  yrs. old GENDER: Male ENDOSCOPIST: R.  Roetta Sessions, MD FACP FACG REFERRED BY:  Harriette Bouillon, M.D.  Artis Delay, M.D. PROCEDURE DATE:  05/27/2013 PROCEDURE:     Ileocolonoscopy with snare polypectomy  INDICATIONS: Abnormal appendiceal area on CT scan  INFORMED CONSENT:  The risks, benefits, alternatives and imponderables including but not limited to bleeding, perforation as well as the possibility of a missed lesion have been reviewed.  The potential for biopsy, lesion removal, etc. have also been discussed.  Questions have been answered.  All parties agreeable. Please see the history and physical in the medical record for more information.  MEDICATIONS: Versed 6 mg IV and Demerol 125 mg IV in divided doses. Phenergan 25 mg IV and Zofran 4 mg IV  DESCRIPTION OF PROCEDURE:  After a digital rectal exam was performed, the EG-2990i ([id number]) and EC-3890Li ([id number]) colonoscope was advanced from the anus through the rectum and colon to the area of the cecum, ileocecal valve and appendiceal orifice. The cecum was deeply intubated.  These structures were well-seen and photographed for the record.  From the level of the cecum and ileocecal valve, the scope was slowly and cautiously withdrawn. The mucosal surfaces were carefully surveyed utilizing scope tip deflection to facilitate fold flattening as needed.  The scope was pulled down into the rectum where a thorough examination  was performed.    FINDINGS:  Adequate preparation.  Neovascular changes of the rectal mucosa consistent with prior history of radiation treatment. No retroflex performed because rectal vault was small rectal mucosa seen well on-face. Patient had multiple 3-5 mm polyps located in the cecum, transverse and descending segments.  Very  few, scattered pancolonic diverticula; otherwise, the colonic mucosa appeared normal.  There was what appeared to be an extrinsic mass effect at the level of the appendiceal orifice. Please see above photographs. Aside from, small cecal polyps, the cecal mucosa appeared normal.  THERAPEUTIC / DIAGNOSTIC MANEUVERS PERFORMED:  The above-mentioned polyps removed with cold snare technique.  COMPLICATIONS: none  CECAL WITHDRAWAL TIME:  16 minutes  IMPRESSION:  Extrinsic mass effect at the level of the appendiceal orifice likely representing an appendiceal or periappendical process.  Colonic polyps-removed as described above. Scattered colonic diverticulosis. Radiation changes involving the rectal mucosa.  RECOMMENDATIONS: Followup on pathology. See EGD report.  Plan followup with Dr. Luisa Hart regarding proceeding with a laparoscopy   _______________________________ eSigned:  R. Roetta Sessions, MD FACP [facility] 05/27/2013 3:14 PM   CC:    PATIENT NAME:  Nakoa, Ganus MR#: [id number]

## 2013-05-30 ENCOUNTER — Encounter (INDEPENDENT_AMBULATORY_CARE_PROVIDER_SITE_OTHER): Payer: Medicare Other | Admitting: Surgery

## 2013-05-31 ENCOUNTER — Encounter (HOSPITAL_COMMUNITY): Payer: Self-pay | Admitting: Internal Medicine

## 2013-06-02 ENCOUNTER — Encounter: Payer: Self-pay | Admitting: Internal Medicine

## 2013-06-06 ENCOUNTER — Telehealth (INDEPENDENT_AMBULATORY_CARE_PROVIDER_SITE_OTHER): Payer: Self-pay | Admitting: *Deleted

## 2013-06-06 ENCOUNTER — Ambulatory Visit (INDEPENDENT_AMBULATORY_CARE_PROVIDER_SITE_OTHER): Payer: Medicare Other | Admitting: Surgery

## 2013-06-06 ENCOUNTER — Encounter (INDEPENDENT_AMBULATORY_CARE_PROVIDER_SITE_OTHER): Payer: Self-pay | Admitting: Surgery

## 2013-06-06 VITALS — BP 126/74 | HR 72 | Temp 97.8°F | Resp 16 | Ht 74.0 in | Wt 276.4 lb

## 2013-06-06 DIAGNOSIS — K6389 Other specified diseases of intestine: Secondary | ICD-10-CM

## 2013-06-06 DIAGNOSIS — K639 Disease of intestine, unspecified: Secondary | ICD-10-CM

## 2013-06-06 NOTE — Telephone Encounter (Signed)
Called pt wife back. Advised the Miralax is now OTC so no prescription needed.

## 2013-06-06 NOTE — Telephone Encounter (Signed)
Patient's wife called asking about the bowel prep order.  She states it says to fill the prescription for Miralax however they were not given a prescription and didn't know if that was just sent to their pharmacy.  I explained that typically we just have people buy the Miralax at the pharmacy however wife states again that it says "fill the prescription".  Explained I would send a message to ask Dr. Luisa Hart for clarification on what his plan was whether to advise patient to buy OTC or whether we will send a prescription.  Wife states understanding and agreeable at this time.

## 2013-06-06 NOTE — Patient Instructions (Signed)
Laparoscopic Bowel Resection Laparoscopic bowel resection is used to remove a piece of large or small intestine that may be red, sore, or swollen (inflamed) or to remove a portion of bowel that is blocked. LET YOUR CAREGIVER KNOW ABOUT:   Allergies to food or medicine.  Medicines taken, including vitamins, herbs, eyedrops, over-the-counter medicines, and creams.  Use of steroids (by mouth or creams).  Previous problems with anesthetics or numbing medicines.  History of bleeding problems or blood clots.  Previous surgery.  Other health problems, including diabetes and kidney problems.  Possibility of pregnancy, if this applies. RISKS AND COMPLICATIONS   Infection.  Bleeding.  Injury to other organs.  Anesthetic side effects.  Leakage from where the bowel is put back together (anastomosis).  Long delay before return of bowel function (ileus). BEFORE THE PROCEDURE   You should be present 60 minutes before your procedure or as directed by your caregiver. PROCEDURE  Laparoscopic means that the procedure is done with a thin, lighted tube (laparoscope). Once you are given medicine that makes you sleep (general anesthetic), your surgeon inflates your abdomen with carbon dioxide gas. The laparoscope is put into your abdomen through a small cut (incision). This allows your surgeon to see into the abdomen. A video camera is attached to the laparoscope to enlarge the view. The surgeon sees this image on a monitor. During the procedure, the portion of bowel to be removed is taken out through one of the incisions. The incision may need to be enlarged if the bowel is too large to be removed through one of the smaller incisions. In this case, a small incision will be made and sometimes the bowel repair is made outside the abdomen. AFTER THE PROCEDURE  If there are no problems, recovery time is brief compared to regular surgery. You will rest in a recovery room until you are stable and doing  well. Following this, if you have no other problems, you will be allowed to return to your room. Recovery time varies depending on what is found during surgery, your age, and your general health. Sometimes, it takes a few days for bowel function to return (passing gas, bowel movements). You will stay in the hospital until bowel function returns. Sometimes, a tube is placed in your stomach, through your nose (nasogastric tube). This tube is used to release pressure from your stomach (decompress) and to decrease nausea until bowel function returns. Document Released: 02/04/2001 Document Revised: 02/10/2012 Document Reviewed: 12/31/2009 [facility] Patient Information 2014 [place], [place].

## 2013-06-06 NOTE — Progress Notes (Signed)
Subjective:     Patient ID: [name], male   DOB: 06-17-1956, 57 y.o.   MRN: [id number]  HPI Patient returns after colonoscopy. There was no mass in the lumen of the cecum and a small benign polyp was removed. This appears to be extrinsic compression of the cecum from this mass.  Review of Systems  Constitutional: Negative.   HENT: Negative.   Respiratory: Negative.   Gastrointestinal: Negative.        Objective:   Physical Exam  Constitutional: He appears well-developed and well-nourished.  Abdominal: Soft. Bowel sounds are normal. He exhibits no distension. There is no tenderness.  Skin: Skin is warm and dry.  Psychiatric: He has a normal mood and affect. His behavior is normal. Judgment normal.  [facility]  [address] [place], [address]  COLONOSCOPY PROCEDURE REPORT  PATIENT: Michael, [name] MR#: [id number]  BIRTHDATE: 09-03-1955 , 57 yrs. old GENDER: Male  ENDOSCOPIST: R. Roetta Sessions, MD FACP FACG  REFERRED BY: Harriette Bouillon, M.D. Artis Delay, M.D.  PROCEDURE DATE: 05/27/2013  PROCEDURE: Ileocolonoscopy with snare polypectomy  INDICATIONS: Abnormal appendiceal area on CT scan  INFORMED CONSENT: The risks, benefits, alternatives and  imponderables including but not limited to bleeding, perforation as  well as the possibility of a missed lesion have been reviewed. The  potential for biopsy, lesion removal, etc. have also been  discussed. Questions have been answered. All parties agreeable.  Please see the history and physical in the medical record for more  information.  MEDICATIONS: Versed 6 mg IV and Demerol 125 mg IV in divided doses.  Phenergan 25 mg IV and Zofran 4 mg IV  DESCRIPTION OF PROCEDURE: After a digital rectal exam was  performed, the EG-2990i ([id number]) and EC-3890Li ([id number])  colonoscope was advanced from the anus through the rectum and colon  to the area of the cecum, ileocecal valve and appendiceal orifice.  The cecum was  deeply intubated. These structures were well-seen  and photographed for the record. From the level of the cecum and  ileocecal valve, the scope was slowly and cautiously withdrawn.  The mucosal surfaces were carefully surveyed utilizing scope tip  deflection to facilitate fold flattening as needed. The scope was  pulled down into the rectum where a thorough examination was  performed.  FINDINGS: Adequate preparation. Neovascular changes of the rectal  mucosa consistent with prior history of radiation treatment. No  retroflex performed because rectal vault was small rectal mucosa  seen well on-face. Patient had multiple 3-5 mm polyps located in  the cecum, transverse and descending segments. Very few, scattered  pancolonic diverticula; otherwise, the colonic mucosa appeared  normal.  There was what appeared to be an extrinsic mass effect at the level  of the appendiceal orifice. Please see above photographs. Aside  from, small cecal polyps, the cecal mucosa appeared normal.  THERAPEUTIC / DIAGNOSTIC MANEUVERS PERFORMED: The above-mentioned  polyps removed with cold snare technique.  COMPLICATIONS:none  CECAL WITHDRAWAL TIME: 16 minutes  IMPRESSION: Extrinsic mass effect at the level of the appendiceal  orifice likely representing an appendiceal or periappendical  process. Colonic polyps-removed as described above. Scattered  colonic diverticulosis. Radiation changes involving the rectal  mucosa.  RECOMMENDATIONS: Followup on pathology. See EGD report. Plan  followup with Dr. Luisa Hart regarding proceeding with a laparoscopy  _______________________________  eSigned: R. Roetta Sessions, MD FACP [facility] 05/27/2013 3:14 PM      Assessment:     Mass of cecum extrinsic  Plan:     Discussed possibilities with the patient and his wife today. This could be as simple as a laparoscopic appendectomy was complicated is a right hemicolectomy. Etiology of this mass is unclear it could represent an  appendiceal carcinoma, an enlarged lymph node or other soft tissue tumor involving the cecal wall. Discussed laparoscopic resection today with the patient and his wife. Discussed possible complications of bleeding, infection, anastomotic leak, intra-abdominal injury to adjacent organs, and the need for an open operation. Other potential risk for exacerbation of underlying medical condition, death, DVT, pulmonary embolus, and other cardiovascular events. He wishes to proceed. Information about the procedure given today. Questions answered.

## 2013-06-29 ENCOUNTER — Encounter (HOSPITAL_COMMUNITY): Payer: Self-pay | Admitting: Pharmacy Technician

## 2013-06-30 ENCOUNTER — Other Ambulatory Visit: Payer: Self-pay

## 2013-07-05 NOTE — Pre-Procedure Instructions (Signed)
JERRARD BRADBURN  07/05/2013   Your procedure is scheduled on:  Tuesday, November 18th   Report to [facility]  2 * 3 at  0730  AM.   Call this number if you have problems the morning of surgery: [phone]   Remember:   Do not eat food or drink liquids after midnight Monday.    Take these medicines the morning of surgery with A SIP OF WATER: Hydrocodone, Pantoprazole   Do not wear jewelry.  Do not wear lotions, powders, or colognes. You may wear deodorant.             Men may shave face and neck.  Do not bring valuables to the hospital.  [facility] is not responsible  for any belongings or valuables.               Contacts, dentures or bridgework may not be worn into surgery.  Leave suitcase in the car. After surgery it may be brought to your room.  For patients admitted to the hospital, discharge time is determined by your treatment team.              Name and phone number of your driver:    Special Instructions: Shower using CHG 2 nights before surgery and the night before surgery.  If you shower the day of surgery use CHG.  Use special wash - you have one bottle of CHG for all showers.  You should use approximately 1/3 of the bottle for each shower.   Please read over the following fact sheets that you were given: Pain Booklet, Coughing and Deep Breathing and Surgical Site Infection Prevention

## 2013-07-06 ENCOUNTER — Encounter (HOSPITAL_COMMUNITY)
Admission: RE | Admit: 2013-07-06 | Discharge: 2013-07-06 | Disposition: A | Discharge status: Home / Self Care | Payer: Medicare Other | Source: Ambulatory Visit | Attending: Surgery | Admitting: Surgery

## 2013-07-06 ENCOUNTER — Encounter (HOSPITAL_COMMUNITY): Payer: Self-pay

## 2013-07-06 DIAGNOSIS — Z01812 Encounter for preprocedural laboratory examination: Secondary | ICD-10-CM | POA: Insufficient documentation

## 2013-07-06 LAB — COMPREHENSIVE METABOLIC PANEL
ALT: 18 U/L (ref 0–53)
Alkaline Phosphatase: 62 U/L (ref 39–117)
BUN: 16 mg/dL (ref 6–23)
CO2: 23 mEq/L (ref 19–32)
Calcium: 9 mg/dL (ref 8.4–10.5)
Chloride: 105 mEq/L (ref 96–112)
GFR calc Af Amer: >90 mL/min (ref >90)
GFR calc non Af Amer: >90 mL/min (ref >90)
Glucose, Bld: 121 mg/dL — ABNORMAL HIGH (ref 70–99)
Potassium: 3.7 mEq/L (ref 3.5–5.1)
Total Bilirubin: 0.2 mg/dL — ABNORMAL LOW (ref 0.3–1.2)
Total Protein: 7.3 g/dL (ref 6.0–8.3)

## 2013-07-06 LAB — CBC WITH DIFFERENTIAL/PLATELET
Basophils Absolute: 0 10*3/uL (ref 0.0–0.1)
Eosinophils Absolute: 0.7 10*3/uL (ref 0.0–0.7)
Eosinophils Relative: 10 % — ABNORMAL HIGH (ref 0–5)
Hemoglobin: 13.3 g/dL (ref 13.0–17.0)
Lymphocytes Relative: 31 % (ref 12–46)
Lymphs Abs: 2.4 10*3/uL (ref 0.7–4.0)
MCH: 32.4 pg (ref 26.0–34.0)
MCV: 95.1 fL (ref 78.0–100.0)
Monocytes Relative: 10 % (ref 3–12)
RBC: 4.1 MIL/uL — ABNORMAL LOW (ref 4.22–5.81)
WBC: 7.7 10*3/uL (ref 4.0–10.5)

## 2013-07-06 MED ORDER — CHLORHEXIDINE GLUCONATE 4 % EX LIQD: 1.0000 "application " | Freq: Once | CUTANEOUS | Status: DC

## 2013-07-06 NOTE — Progress Notes (Signed)
Pt had stress test in 2009, also had heart cath in 11/2010 by Dr. Angelyn Punt was clean", has OSA, wears CPAP mask and his setting is at 14.  I have requested sleep study from [facility] in [place]-- [phone].

## 2013-07-11 MED ORDER — CIPROFLOXACIN IN D5W 400 MG/200ML IV SOLN
400.0000 mg | INTRAVENOUS | Status: AC
  Administered 2013-07-12: 400 mg via INTRAVENOUS
  Filled 2013-07-11: qty 200

## 2013-07-11 MED ORDER — METRONIDAZOLE IN NACL 5-0.79 MG/ML-% IV SOLN
500.0000 mg | Freq: Three times a day (TID) | INTRAVENOUS | Status: DC
  Administered 2013-07-12: .5 g via INTRAVENOUS
  Filled 2013-07-11 (×3): qty 100

## 2013-07-12 ENCOUNTER — Encounter (HOSPITAL_COMMUNITY): Payer: Self-pay | Admitting: *Deleted

## 2013-07-12 ENCOUNTER — Encounter (HOSPITAL_COMMUNITY): Payer: Medicare Other | Admitting: Anesthesiology

## 2013-07-12 ENCOUNTER — Inpatient Hospital Stay (HOSPITAL_COMMUNITY): Payer: Medicare Other | Admitting: Anesthesiology

## 2013-07-12 ENCOUNTER — Observation Stay (HOSPITAL_COMMUNITY)
Admission: RE | Admit: 2013-07-12 | Discharge: 2013-07-12 | Disposition: A | Discharge status: Home / Self Care | Payer: Medicare Other | Source: Ambulatory Visit | Attending: Surgery | Admitting: Surgery

## 2013-07-12 ENCOUNTER — Encounter (HOSPITAL_COMMUNITY)
Admission: RE | Disposition: A | Discharge status: Home / Self Care | Payer: Self-pay | Source: Ambulatory Visit | Attending: Surgery

## 2013-07-12 DIAGNOSIS — E782 Mixed hyperlipidemia: Secondary | ICD-10-CM

## 2013-07-12 DIAGNOSIS — J4489 Other specified chronic obstructive pulmonary disease: Secondary | ICD-10-CM | POA: Insufficient documentation

## 2013-07-12 DIAGNOSIS — K219 Gastro-esophageal reflux disease without esophagitis: Secondary | ICD-10-CM | POA: Insufficient documentation

## 2013-07-12 DIAGNOSIS — J449 Chronic obstructive pulmonary disease, unspecified: Secondary | ICD-10-CM | POA: Insufficient documentation

## 2013-07-12 DIAGNOSIS — E669 Obesity, unspecified: Secondary | ICD-10-CM

## 2013-07-12 DIAGNOSIS — M549 Dorsalgia, unspecified: Secondary | ICD-10-CM

## 2013-07-12 DIAGNOSIS — R1310 Dysphagia, unspecified: Secondary | ICD-10-CM

## 2013-07-12 DIAGNOSIS — Z720 Tobacco use: Secondary | ICD-10-CM

## 2013-07-12 DIAGNOSIS — C786 Secondary malignant neoplasm of retroperitoneum and peritoneum: Secondary | ICD-10-CM

## 2013-07-12 DIAGNOSIS — F17200 Nicotine dependence, unspecified, uncomplicated: Secondary | ICD-10-CM | POA: Insufficient documentation

## 2013-07-12 DIAGNOSIS — G4730 Sleep apnea, unspecified: Secondary | ICD-10-CM | POA: Insufficient documentation

## 2013-07-12 DIAGNOSIS — K388 Other specified diseases of appendix: Secondary | ICD-10-CM

## 2013-07-12 DIAGNOSIS — C785 Secondary malignant neoplasm of large intestine and rectum: Secondary | ICD-10-CM | POA: Insufficient documentation

## 2013-07-12 DIAGNOSIS — C61 Malignant neoplasm of prostate: Principal | ICD-10-CM | POA: Insufficient documentation

## 2013-07-12 DIAGNOSIS — I10 Essential (primary) hypertension: Secondary | ICD-10-CM

## 2013-07-12 DIAGNOSIS — K6389 Other specified diseases of intestine: Secondary | ICD-10-CM

## 2013-07-12 HISTORY — PX: COLON RESECTION: SHX5231

## 2013-07-12 LAB — CBC
HCT: 36.5 % — ABNORMAL LOW (ref 39.0–52.0)
Hemoglobin: 12.1 g/dL — ABNORMAL LOW (ref 13.0–17.0)
MCHC: 33.2 g/dL (ref 30.0–36.0)
MCV: 95.5 fL (ref 78.0–100.0)
RDW: 12.6 % (ref 11.5–15.5)

## 2013-07-12 LAB — CREATININE, SERUM: GFR calc Af Amer: >90 mL/min (ref >90)

## 2013-07-12 SURGERY — COLON RESECTION LAPAROSCOPIC
Anesthesia: General | Site: Abdomen | Wound class: Clean

## 2013-07-12 MED ORDER — ONDANSETRON HCL 4 MG/2ML IJ SOLN
INTRAMUSCULAR | Status: DC | PRN
  Administered 2013-07-12: 4 mg via INTRAVENOUS

## 2013-07-12 MED ORDER — SIMVASTATIN 40 MG PO TABS: 40.0000 mg | ORAL_TABLET | Freq: Every evening | ORAL | Status: DC

## 2013-07-12 MED ORDER — LIDOCAINE HCL (CARDIAC) 20 MG/ML IV SOLN
INTRAVENOUS | Status: DC | PRN
  Administered 2013-07-12: 40 mg via INTRAVENOUS

## 2013-07-12 MED ORDER — MIDAZOLAM HCL 2 MG/2ML IJ SOLN: 0.5000 mg | Freq: Once | INTRAMUSCULAR | Status: DC | PRN

## 2013-07-12 MED ORDER — OXYCODONE HCL 5 MG/5ML PO SOLN: 5.0000 mg | Freq: Once | ORAL | Status: AC | PRN

## 2013-07-12 MED ORDER — PROMETHAZINE HCL 25 MG/ML IJ SOLN: 6.2500 mg | INTRAMUSCULAR | Status: DC | PRN

## 2013-07-12 MED ORDER — GLYCOPYRROLATE 0.2 MG/ML IJ SOLN
INTRAMUSCULAR | Status: DC | PRN
  Administered 2013-07-12: 0.6 mg via INTRAVENOUS

## 2013-07-12 MED ORDER — MIDAZOLAM HCL 5 MG/5ML IJ SOLN
INTRAMUSCULAR | Status: DC | PRN
  Administered 2013-07-12: 2 mg via INTRAVENOUS

## 2013-07-12 MED ORDER — BUPIVACAINE-EPINEPHRINE (PF) 0.25% -1:200000 IJ SOLN
INTRAMUSCULAR | Status: DC | PRN
  Administered 2013-07-12: 30 mL

## 2013-07-12 MED ORDER — ARTIFICIAL TEARS OP OINT
TOPICAL_OINTMENT | OPHTHALMIC | Status: DC | PRN
  Administered 2013-07-12: 1 via OPHTHALMIC

## 2013-07-12 MED ORDER — HYDROMORPHONE HCL PF 1 MG/ML IJ SOLN
0.2500 mg | INTRAMUSCULAR | Status: DC | PRN
  Administered 2013-07-12 (×2): 0.5 mg via INTRAVENOUS

## 2013-07-12 MED ORDER — HYDROMORPHONE HCL PF 1 MG/ML IJ SOLN: 1.0000 mg | INTRAMUSCULAR | Status: DC | PRN

## 2013-07-12 MED ORDER — OXYCODONE HCL 5 MG PO TABS
5.0000 mg | ORAL_TABLET | Freq: Once | ORAL | Status: AC | PRN
  Administered 2013-07-12: 5 mg via ORAL

## 2013-07-12 MED ORDER — PROPOFOL 10 MG/ML IV BOLUS
INTRAVENOUS | Status: DC | PRN
  Administered 2013-07-12: 200 mg via INTRAVENOUS

## 2013-07-12 MED ORDER — LACTATED RINGERS IV SOLN
INTRAVENOUS | Status: DC | PRN
  Administered 2013-07-12 (×2): via INTRAVENOUS

## 2013-07-12 MED ORDER — LACTATED RINGERS IV SOLN
INTRAVENOUS | Status: DC
  Administered 2013-07-12: 08:00:00 via INTRAVENOUS

## 2013-07-12 MED ORDER — HYDROCODONE-ACETAMINOPHEN 5-325 MG PO TABS: 1.0000 | ORAL_TABLET | Freq: Three times a day (TID) | ORAL | Status: DC

## 2013-07-12 MED ORDER — MEPERIDINE HCL 25 MG/ML IJ SOLN: 6.2500 mg | INTRAMUSCULAR | Status: DC | PRN

## 2013-07-12 MED ORDER — SODIUM CHLORIDE 0.9 % IR SOLN
Status: DC | PRN
  Administered 2013-07-12: 1000 mL

## 2013-07-12 MED ORDER — HEMOSTATIC AGENTS (NO CHARGE) OPTIME
TOPICAL | Status: DC | PRN
  Administered 2013-07-12: 1 via TOPICAL

## 2013-07-12 MED ORDER — FENTANYL CITRATE 0.05 MG/ML IJ SOLN
INTRAMUSCULAR | Status: DC | PRN
  Administered 2013-07-12: 250 ug via INTRAVENOUS

## 2013-07-12 MED ORDER — ARIPIPRAZOLE 5 MG PO TABS
5.0000 mg | ORAL_TABLET | Freq: Every day | ORAL | Status: DC
  Filled 2013-07-12: qty 1

## 2013-07-12 MED ORDER — SODIUM CHLORIDE 0.9 % IR SOLN
Status: DC | PRN
  Administered 2013-07-12 (×2): 1000 mL

## 2013-07-12 MED ORDER — OXYCODONE HCL 5 MG PO TABS
ORAL_TABLET | ORAL | Status: AC
  Filled 2013-07-12: qty 1

## 2013-07-12 MED ORDER — PANTOPRAZOLE SODIUM 40 MG PO TBEC: 40.0000 mg | DELAYED_RELEASE_TABLET | Freq: Every day | ORAL | Status: DC

## 2013-07-12 MED ORDER — OXYCODONE-ACETAMINOPHEN 5-325 MG PO TABS: 1.0000 | ORAL_TABLET | ORAL | Status: DC | PRN

## 2013-07-12 MED ORDER — ENALAPRIL MALEATE 10 MG PO TABS
10.0000 mg | ORAL_TABLET | Freq: Every day | ORAL | Status: DC
  Administered 2013-07-12: 10 mg via ORAL
  Filled 2013-07-12: qty 1

## 2013-07-12 MED ORDER — HYDROCODONE-ACETAMINOPHEN 5-325 MG PO TABS
1.0000 | ORAL_TABLET | Freq: Three times a day (TID) | ORAL | Status: DC
  Administered 2013-07-12: 1 via ORAL
  Filled 2013-07-12: qty 1

## 2013-07-12 MED ORDER — DOXEPIN HCL 50 MG PO CAPS
50.0000 mg | ORAL_CAPSULE | Freq: Every day | ORAL | Status: DC
  Filled 2013-07-12: qty 1

## 2013-07-12 MED ORDER — ROCURONIUM BROMIDE 100 MG/10ML IV SOLN
INTRAVENOUS | Status: DC | PRN
  Administered 2013-07-12: 10 mg via INTRAVENOUS
  Administered 2013-07-12: 50 mg via INTRAVENOUS

## 2013-07-12 MED ORDER — HYDROMORPHONE HCL PF 1 MG/ML IJ SOLN
INTRAMUSCULAR | Status: AC
  Filled 2013-07-12: qty 1

## 2013-07-12 MED ORDER — NEOSTIGMINE METHYLSULFATE 1 MG/ML IJ SOLN
INTRAMUSCULAR | Status: DC | PRN
  Administered 2013-07-12: 5 mg via INTRAVENOUS

## 2013-07-12 MED ORDER — ENOXAPARIN SODIUM 40 MG/0.4ML ~~LOC~~ SOLN: 40.0000 mg | SUBCUTANEOUS | Status: DC

## 2013-07-12 SURGICAL SUPPLY — 74 items
ADH SKN CLS APL DERMABOND .7 (GAUZE/BANDAGES/DRESSINGS) ×1
APPLIER CLIP ROT 10 11.4 M/L (STAPLE)
APR CLP MED LRG 11.4X10 (STAPLE)
BLADE SURG 10 STRL SS (BLADE) ×2 IMPLANT
BLADE SURG 11 STRL SS (BLADE) ×1 IMPLANT
BLADE SURG ROTATE 9660 (MISCELLANEOUS) ×1 IMPLANT
CANISTER SUCTION 2500CC (MISCELLANEOUS) ×2 IMPLANT
CELLS DAT CNTRL 66122 CELL SVR (MISCELLANEOUS) IMPLANT
CHLORAPREP W/TINT 26ML (MISCELLANEOUS) ×2 IMPLANT
CLIP APPLIE ROT 10 11.4 M/L (STAPLE) IMPLANT
CONT SPEC 4OZ CLIKSEAL STRL BL (MISCELLANEOUS) ×1 IMPLANT
COVER MAYO STAND STRL (DRAPES) ×3 IMPLANT
COVER SURGICAL LIGHT HANDLE (MISCELLANEOUS) ×2 IMPLANT
DECANTER SPIKE VIAL GLASS SM (MISCELLANEOUS) ×2 IMPLANT
DERMABOND ADVANCED (GAUZE/BANDAGES/DRESSINGS) ×1
DERMABOND ADVANCED .7 DNX12 (GAUZE/BANDAGES/DRESSINGS) IMPLANT
DRAPE PROXIMA HALF (DRAPES) ×2 IMPLANT
DRAPE UTILITY 15X26 W/TAPE STR (DRAPE) ×7 IMPLANT
DRAPE WARM FLUID 44X44 (DRAPE) ×3 IMPLANT
DRSG OPSITE POSTOP 4X10 (GAUZE/BANDAGES/DRESSINGS) IMPLANT
DRSG OPSITE POSTOP 4X8 (GAUZE/BANDAGES/DRESSINGS) IMPLANT
ELECT BLADE 6.5 EXT (BLADE) ×1 IMPLANT
ELECT CAUTERY BLADE 6.4 (BLADE) ×3 IMPLANT
ELECT REM PT RETURN 9FT ADLT (ELECTROSURGICAL) ×2
ELECTRODE REM PT RTRN 9FT ADLT (ELECTROSURGICAL) ×1 IMPLANT
GEL ULTRASOUND 20GR AQUASONIC (MISCELLANEOUS) IMPLANT
GLOVE BIO SURGEON STRL SZ 6.5 (GLOVE) ×1 IMPLANT
GLOVE BIO SURGEON STRL SZ7.5 (GLOVE) ×1 IMPLANT
GLOVE BIO SURGEON STRL SZ8 (GLOVE) ×3 IMPLANT
GLOVE BIOGEL PI IND STRL 7.0 (GLOVE) IMPLANT
GLOVE BIOGEL PI IND STRL 8 (GLOVE) ×2 IMPLANT
GLOVE BIOGEL PI INDICATOR 7.0 (GLOVE) ×2
GLOVE BIOGEL PI INDICATOR 8 (GLOVE) ×5
GLOVE SS N UNI LF 7.0 STRL (GLOVE) ×1 IMPLANT
GOWN STRL NON-REIN LRG LVL3 (GOWN DISPOSABLE) ×8 IMPLANT
GOWN STRL REIN XL XLG (GOWN DISPOSABLE) ×3 IMPLANT
HEMOSTAT SURGICEL 2X14 (HEMOSTASIS) ×1 IMPLANT
KIT BASIN OR (CUSTOM PROCEDURE TRAY) ×2 IMPLANT
KIT ROOM TURNOVER OR (KITS) ×2 IMPLANT
LEGGING LITHOTOMY PAIR STRL (DRAPES) IMPLANT
LIGASURE IMPACT 36 18CM CVD LR (INSTRUMENTS) IMPLANT
NS IRRIG 1000ML POUR BTL (IV SOLUTION) ×4 IMPLANT
PAD ARMBOARD 7.5X6 YLW CONV (MISCELLANEOUS) ×3 IMPLANT
PENCIL BUTTON HOLSTER BLD 10FT (ELECTRODE) ×3 IMPLANT
RETRACTOR WND ALEXIS 18 MED (MISCELLANEOUS) IMPLANT
RTRCTR WOUND ALEXIS 18CM MED (MISCELLANEOUS)
SCALPEL HARMONIC ACE (MISCELLANEOUS) IMPLANT
SCISSORS LAP 5X35 DISP (ENDOMECHANICALS) ×1 IMPLANT
SET IRRIG TUBING LAPAROSCOPIC (IRRIGATION / IRRIGATOR) ×1 IMPLANT
SLEEVE ENDOPATH XCEL 5M (ENDOMECHANICALS) ×5 IMPLANT
SPECIMEN JAR LARGE (MISCELLANEOUS) IMPLANT
STAPLER VISISTAT 35W (STAPLE) ×1 IMPLANT
SURGILUBE 2OZ TUBE FLIPTOP (MISCELLANEOUS) IMPLANT
SUT MNCRL AB 4-0 PS2 18 (SUTURE) ×1 IMPLANT
SUT PDS AB 1 CT  36 (SUTURE)
SUT PDS AB 1 CT 36 (SUTURE) IMPLANT
SUT PROLENE 2 0 CT2 30 (SUTURE) IMPLANT
SUT PROLENE 2 0 KS (SUTURE) IMPLANT
SUT VIC AB 2-0 SH 18 (SUTURE) ×1 IMPLANT
SUT VIC AB 3-0 SH 18 (SUTURE) ×1 IMPLANT
SUT VICRYL AB 2 0 TIES (SUTURE) ×1 IMPLANT
SUT VICRYL AB 3 0 TIES (SUTURE) ×1 IMPLANT
SYS LAPSCP GELPORT 120MM (MISCELLANEOUS)
SYSTEM LAPSCP GELPORT 120MM (MISCELLANEOUS) IMPLANT
TOWEL OR 17X26 10 PK STRL BLUE (TOWEL DISPOSABLE) ×3 IMPLANT
TRAY FOLEY CATH 14FRSI W/METER (CATHETERS) ×2 IMPLANT
TRAY LAPAROSCOPIC (CUSTOM PROCEDURE TRAY) ×2 IMPLANT
TRAY PROCTOSCOPIC FIBER OPTIC (SET/KITS/TRAYS/PACK) IMPLANT
TROCAR XCEL NON-BLD 11X100MML (ENDOMECHANICALS) ×2 IMPLANT
TROCAR XCEL NON-BLD 5MMX100MML (ENDOMECHANICALS) ×2 IMPLANT
TUBE CONNECTING 12X1/4 (SUCTIONS) ×3 IMPLANT
TUBING FILTER THERMOFLATOR (ELECTROSURGICAL) ×2 IMPLANT
WATER STERILE IRR 1000ML POUR (IV SOLUTION) IMPLANT
YANKAUER SUCT BULB TIP NO VENT (SUCTIONS) ×3 IMPLANT

## 2013-07-12 NOTE — Transfer of Care (Signed)
Immediate Anesthesia Transfer of Care Note  Patient: [name]  Procedure(s) Performed: Procedure(s): DIAGNOSTIC LAPAROSCOPY; PERITONEAL NODULE (N/A)  Patient Location: PACU  Anesthesia Type:General  Level of Consciousness: awake, alert  and patient cooperative  Airway & Oxygen Therapy: Patient Spontanous Breathing and Patient connected to nasal cannula oxygen  Post-op Assessment: Report given to PACU RN and Post -op Vital signs reviewed and stable  Post vital signs: Reviewed and stable  Complications: No apparent anesthesia complications

## 2013-07-12 NOTE — H&P (Signed)
[name] Description: 57 year old male  06/06/2013 10:30 AM Office Visit Provider: Clovis Pu. Andreia Gandolfi, MD  MRN: [id number] Department: [facility]            Diagnoses Reason for Visit   Mass of cecum - Primary  Routine Post Op   569.9  4 wk f/u colon           Current Vitals - Last Recorded    BP Pulse Temp(Src) Resp Ht Wt   126/74 72 97.8 F (36.6 C) (Temporal) 16 6\' 2"  (1.88 m) 276 lb 6.4 oz (125.374 kg)       BMI             35.47 kg/m2                 Progress Notes    Giuliano Preece A. Dondrell Loudermilk, MD at 06/06/2013 10:56 AM    Status: Signed        Subjective:      Patient ID: [name], male DOB: 11-Nov-1955, 57 y.o. MRN: [id number]  HPI  Patient returns after colonoscopy. There was no mass in the lumen of the cecum and a small benign polyp was removed. This appears to be extrinsic compression of the cecum from this mass.  Review of Systems  Constitutional: Negative.  HENT: Negative.  Respiratory: Negative.  Gastrointestinal: Negative.      Objective:      Physical Exam  Constitutional: He appears well-developed and well-nourished.  Abdominal: Soft. Bowel sounds are normal. He exhibits no distension. There is no tenderness.  Skin: Skin is warm and dry.  Psychiatric: He has a normal mood and affect. His behavior is normal. Judgment normal.  [facility]  [address] [place], [address]  COLONOSCOPY PROCEDURE REPORT  PATIENT: [name], [name] MR#: [id number]  BIRTHDATE: Jul 04, 1956 , 57 yrs. old GENDER: Male  ENDOSCOPIST: R. Roetta Sessions, MD FACP FACG  REFERRED BY: Harriette Bouillon, M.D. Artis Delay, M.D.  PROCEDURE DATE: 05/27/2013  PROCEDURE: Ileocolonoscopy with snare polypectomy  INDICATIONS: Abnormal appendiceal area on CT scan  INFORMED CONSENT: The risks, benefits, alternatives and  imponderables including but not limited to bleeding, perforation as  well as the possibility of a missed lesion have been  reviewed. The  potential for biopsy, lesion removal, etc. have also been  discussed. Questions have been answered. All parties agreeable.  Please see the history and physical in the medical record for more  information.  MEDICATIONS: Versed 6 mg IV and Demerol 125 mg IV in divided doses.  Phenergan 25 mg IV and Zofran 4 mg IV  DESCRIPTION OF PROCEDURE: After a digital rectal exam was  performed, the EG-[id number] ([id number]) and EC-3890Li ([id number])  colonoscope was advanced from the anus through the rectum and colon  to the area of the cecum, ileocecal valve and appendiceal orifice.  The cecum was deeply intubated. These structures were well-seen  and photographed for the record. From the level of the cecum and  ileocecal valve, the scope was slowly and cautiously withdrawn.  The mucosal surfaces were carefully surveyed utilizing scope tip  deflection to facilitate fold flattening as needed. The scope was  pulled down into the rectum where a thorough examination was  performed.  FINDINGS: Adequate preparation. Neovascular changes of the rectal  mucosa consistent with prior history of radiation treatment. No  retroflex performed because rectal vault was small rectal mucosa  seen well on-face. Patient had  multiple 3-5 mm polyps located in  the cecum, transverse and descending segments. Very few, scattered  pancolonic diverticula; otherwise, the colonic mucosa appeared  normal.  There was what appeared to be an extrinsic mass effect at the level  of the appendiceal orifice. Please see above photographs. Aside  from, small cecal polyps, the cecal mucosa appeared normal.  THERAPEUTIC / DIAGNOSTIC MANEUVERS PERFORMED: The above-mentioned  polyps removed with cold snare technique.  COMPLICATIONS:none  CECAL WITHDRAWAL TIME: 16 minutes  IMPRESSION: Extrinsic mass effect at the level of the appendiceal  orifice likely representing an appendiceal or periappendical  process. Colonic polyps-removed as  described above. Scattered  colonic diverticulosis. Radiation changes involving the rectal  mucosa.  RECOMMENDATIONS: Followup on pathology. See EGD report. Plan  followup with Dr. Luisa Hart regarding proceeding with a laparoscopy  _______________________________  eSigned: R. Roetta Sessions, MD [facility] 05/27/2013 3:14 PM      Assessment:      Mass of cecum extrinsic      Plan:      Discussed possibilities with the patient and his wife today. This could be as simple as a laparoscopic appendectomy was complicated is a right hemicolectomy. Etiology of this mass is unclear it could represent an appendiceal carcinoma, an enlarged lymph node or other soft tissue tumor involving the cecal wall. Discussed laparoscopic resection today with the patient and his wife. Discussed possible complications of bleeding, infection, anastomotic leak, intra-abdominal injury to adjacent organs, and the need for an open operation. Other potential risk for exacerbation of underlying medical condition, death, DVT, pulmonary embolus, and other cardiovascular events. He wishes to proceed. Information about the procedure given today. Questions answered.

## 2013-07-12 NOTE — Anesthesia Postprocedure Evaluation (Signed)
  Anesthesia Post-op Note  Patient: [name]  Procedure(s) Performed: Procedure(s): DIAGNOSTIC LAPAROSCOPY; PERITONEAL NODULE (N/A)  Patient Location: PACU  Anesthesia Type:General  Level of Consciousness: awake, alert , oriented and patient cooperative  Airway and Oxygen Therapy: Patient Spontanous Breathing and Patient connected to nasal cannula oxygen  Post-op Pain: mild  Post-op Assessment: Post-op Vital signs reviewed, Patient's Cardiovascular Status Stable, Respiratory Function Stable, Patent Airway, No signs of Nausea or vomiting and Pain level controlled  Post-op Vital Signs: Reviewed and stable  Complications: No apparent anesthesia complications

## 2013-07-12 NOTE — Interval H&P Note (Signed)
History and Physical Interval Note:  07/12/2013 8:47 AM  [name]  has presented today for surgery, with the diagnosis of colon mass  The various methods of treatment have been discussed with the patient and family. After consideration of risks, benefits and other options for treatment, the patient has consented to  Procedure(s): laparoscopic partial colectomy (N/A) as a surgical intervention .  The patient's history has been reviewed, patient examined, no change in status, stable for surgery.  I have reviewed the patient's chart and labs.  Questions were answered to the patient's satisfaction.     [name]

## 2013-07-12 NOTE — Anesthesia Procedure Notes (Signed)
Procedure Name: Intubation Date/Time: 07/12/2013 9:24 AM Performed by: Coralee Rud Pre-anesthesia Checklist: Patient identified, Emergency Drugs available, Suction available and Patient being monitored Patient Re-evaluated:Patient Re-evaluated prior to inductionOxygen Delivery Method: Circle system utilized Preoxygenation: Pre-oxygenation with 100% oxygen Intubation Type: IV induction Ventilation: Mask ventilation without difficulty Laryngoscope Size: Miller and 3 Grade View: Grade II Tube type: Oral Tube size: 8.0 mm Number of attempts: 1 Airway Equipment and Method: Stylet Placement Confirmation: ETT inserted through vocal cords under direct vision and positive ETCO2 Secured at: 22 cm Tube secured with: Tape Dental Injury: Teeth and Oropharynx as per pre-operative assessment

## 2013-07-12 NOTE — Discharge Summary (Signed)
Physician Discharge Summary  Patient ID: [name] MRN: [id number] DOB/AGE: 1956-05-25 57 y.o.  Admit date: 07/12/2013 Discharge date: 07/12/2013  Admission Diagnoses:abdominal mass Patient Active Problem List   Diagnosis Date Noted  . Esophageal dysphagia 05/25/2013  . Mass of appendix 05/25/2013  . Colonic mass 05/25/2013  . Tobacco abuse 01/19/2013  . HTN (hypertension) 01/19/2013  . Mixed hyperlipidemia 01/19/2013  . Obesity (BMI 30-39.9) 01/19/2013  . COPD (chronic obstructive pulmonary disease) with chronic bronchitis 01/19/2013  . Back pain 11/01/2012     Discharge Diagnoses: carcinomatosis Active Problems:   * No active hospital problems. *   Discharged Condition: good  Hospital Course: unremarkable.  Consults: None   Treatments: surgery: laparoscopy and biopsy  Discharge Exam: Blood pressure 98/65, pulse 96, temperature 98.2 F (36.8 C), temperature source Oral, resp. rate 17, SpO2 97.00%. Incision/Wound:C/D/I soft abdomen  Disposition: 01-Home or Self Care  Discharge Orders   Future Orders Complete By Expires   Diet - low sodium heart healthy  As directed    Increase activity slowly  As directed        Medication List         ALIVE MENS ENERGY PO  Take 1 tablet by mouth daily.     ARIPiprazole 5 MG tablet  Commonly known as:  ABILIFY  Take 5 mg by mouth at bedtime.     CALCIUM 1000 + D PO  Take 1 tablet by mouth daily.     clindamycin 150 MG capsule  Commonly known as:  CLEOCIN  Take 150 mg by mouth 3 (three) times daily.     COQ10 150 MG Caps  Take 1 capsule by mouth daily.     doxepin 50 MG capsule  Commonly known as:  SINEQUAN  Take 50 mg by mouth at bedtime.     enalapril 10 MG tablet  Commonly known as:  VASOTEC  Take 10 mg by mouth daily.     FLUoxetine HCl 60 MG Tabs  Take 60 mg by mouth daily.     HYDROcodone-acetaminophen 5-325 MG per tablet  Commonly known as:  NORCO/VICODIN  Take 1 tablet by mouth every 8  (eight) hours.     HYDROcodone-acetaminophen 5-325 MG per tablet  Commonly known as:  NORCO/VICODIN  Take 1 tablet by mouth every 8 (eight) hours.     NUVIGIL 150 MG tablet  Generic drug:  Armodafinil  Take 75 mg by mouth daily.     OMEGA-3 FISH OIL-VITAMIN D3 PO  Take 1 capsule by mouth daily.     pantoprazole 40 MG tablet  Commonly known as:  PROTONIX  Take 40 mg by mouth daily.     simvastatin 40 MG tablet  Commonly known as:  ZOCOR  Take 40 mg by mouth every evening.     TRILIPIX 135 MG capsule  Generic drug:  Choline Fenofibrate  Take 135 mg by mouth daily.         Signed: Treysen Sudbeck A. 07/12/2013, 3:57 PM

## 2013-07-12 NOTE — Discharge Planning (Signed)
Patient discharged home in stable condition. Verbalizes understanding of all discharge instructions, including home medications and follow up appointments. 

## 2013-07-12 NOTE — Anesthesia Preprocedure Evaluation (Signed)
Anesthesia Evaluation  Patient identified by MRN, date of birth, ID band Patient awake    Reviewed: Allergy & Precautions, H&P , NPO status , Patient's Chart, lab work & pertinent test results  History of Anesthesia Complications Negative for: history of anesthetic complications  Airway Mallampati: II TM Distance: >3 FB Neck ROM: Full    Dental  (+) Missing, Poor Dentition and Dental Advisory Given   Pulmonary sleep apnea and Continuous Positive Airway Pressure Ventilation , COPDCurrent Smoker,  breath sounds clear to auscultation  Pulmonary exam normal       Cardiovascular hypertension, Pt. on medications - anginaRhythm:Regular Rate:Normal  '12 cath: normal coronaries, normal LVF   Neuro/Psych Anxiety Depression negative neurological ROS     GI/Hepatic Neg liver ROS, GERD-  Medicated and Controlled,  Endo/Other  Morbid obesity  Renal/GU negative Renal ROS     Musculoskeletal   Abdominal (+) + obese,   Peds  Hematology negative hematology ROS (+)   Anesthesia Other Findings   Reproductive/Obstetrics                           Anesthesia Physical Anesthesia Plan  ASA: III  Anesthesia Plan: General   Post-op Pain Management:    Induction: Intravenous  Airway Management Planned: Oral ETT  Additional Equipment:   Intra-op Plan:   Post-operative Plan: Extubation in OR  Informed Consent: I have reviewed the patients History and Physical, chart, labs and discussed the procedure including the risks, benefits and alternatives for the proposed anesthesia with the patient or authorized representative who has indicated his/her understanding and acceptance.   Dental advisory given  Plan Discussed with: CRNA and Surgeon  Anesthesia Plan Comments: (Plan routine monitors, GETA)        Anesthesia Quick Evaluation

## 2013-07-12 NOTE — Brief Op Note (Signed)
07/12/2013  10:54 AM  PATIENT:  [name]  57 y.o. male  PRE-OPERATIVE DIAGNOSIS:  colon mass  POST-OPERATIVE DIAGNOSIS:  Carcinomotosis  PROCEDURE:  Procedure(s): DIAGNOSTIC LAPAROSCOPY; PERITONEAL NODULE (N/A)  SURGEON:  Surgeon(s) and Role:    * November Sypher A. Charice Zuno, MD - Primary      ANESTHESIA:   general  EBL:  Total I/O In: 1000 [I.V.:1000] Out: 100 [Urine:100]  BLOOD ADMINISTERED:none  DRAINS: none   LOCAL MEDICATIONS USED:  BUPIVICAINE   SPECIMEN:  Source of Specimen:  peritoneal nodule  DISPOSITION OF SPECIMEN:  PATHOLOGY  COUNTS:  YES  TOURNIQUET:  * No tourniquets in log *  DICTATION: .Other Dictation: Dictation Number  [id number]  PLAN OF CARE: Admit for overnight observation  PATIENT DISPOSITION:  PACU - hemodynamically stable.   Delay start of Pharmacological VTE agent (>24hrs) due to surgical blood loss or risk of bleeding: no

## 2013-07-13 NOTE — Op Note (Signed)
NAMEARYON, NHAM NO.:  [id number]  MEDICAL RECORD NO.:  [id number]  LOCATION:  6N11C                        FACILITY:  [facility]  PHYSICIAN:  Maisie Fus A. Kyran Whittier, M.D.DATE OF BIRTH:  1955/12/09  DATE OF PROCEDURE:  07/12/2013 DATE OF DISCHARGE:  07/12/2013                              OPERATIVE REPORT   PREOPERATIVE DIAGNOSIS:  A 3.5-cm mass extrinsic to the cecum.  POSTOPERATIVE DIAGNOSIS:  Carcinomatosis with peritoneal studding 3.5-cm pericecal mass not causing obstruction and significant seeding of small bowel diaphragm and omentum.  PROCEDURE:  Diagnostic laparoscopy with biopsy of peritoneal nodule.  SURGEON:  Maisie Fus A. Johnnae Impastato, M.D.  ANESTHESIA:  General endotracheal anesthesia with 0.25% Sensorcaine local.  EBL:  Less than 30 mL.  SPECIMEN:  Peritoneal nodule sent for frozen section showing adenocarcinoma consistent with either prostatic or peripancreatic primary.  This was not felt to be a GI primary per the pathologist on initial evaluation.  IV FLUIDS:  1000 mL crystalloid.  DRAINS:  None.  INDICATIONS FOR PROCEDURE:  The patient is a 57 year old male who in 2007 underwent a robotic prostatectomy for a Gleason 5/7 adenocarcinoma of the prostate.  He has been monitored for the last few years, noticed a spike in his PSA, was sent for CT scan, which showed a nondescript 3- cm mass at the appendiceal orifice and at the crotch of the terminal ileum and cecum.  He was sent for colonoscopy, which was otherwise normal except for some extrinsic compression.  No signs of obstruction on CT scan.  He has been relatively asymptomatic except for the rising PSA.  I recommended a laparoscopy to further evaluate and possible right hemicolectomy.  This indeed was a mass of the appendix, which could have been on CT scan or other etiology.  He may require a right hemicolectomy, I told him, or cecectomy or possible appendectomy.  We discussed all the above  possibilities, the risks and benefits of surgery in his case, potential complications of bleeding, infection, anastomotic breakdown, bowel injury, injury to bladder, injury to other internal organs, and the need for open surgery, as well as other treatments and potential surgery down the road.  He understood the above, wished to proceed.  DESCRIPTION OF PROCEDURE:  The patient met in the holding area and questions were answered.  He was taken back to the operating room, at this point in time, placed supine with the left arm was tucked after induction of general anesthesia without difficulty.  Foley catheter was placed under sterile conditions without difficulty.  The abdomen was prepped and draped in sterile fashion.  He received preoperative antibiotics.  Time-out was done.  A 5-mm Optiview port was placed in the left mid abdomen using the scope to guide the entrance into the abdominal cavity without signs of injury to this area.  Laparoscopy was performed after insufflation to 15 mmHg of CO2.  There was somewhat appeared to be carcinomatosis upon initial examination, but no significant signs of obstruction or ascites.  I then placed additional 3 other 5-mm ports, 1 in the left lower abdomen, 1 in the right midline, and 1 in the right upper quadrant.  We used a 5-mm scope  to further examine the abdominal cavity.  Multiple pictures were taken, which showed significant carcinomatosis involving the undersurface of the diaphragm.  In the right lower quadrant involving the cecum and terminal ileum, there was significant studding in all the peritoneal surfaces in the right lower quadrant, and there are multiple nodules.  Using the hot scissors, I excised one of the peritoneal nodules and sent to pathology. The pathologist reviewed and felt this was consistent with an adenocarcinoma either from prostate or potentially pancreas.  This was not a GI primary fill.  I feel this should be processed  further.  After this was done, I was able to identify the appendix and this mass was originating from the mesentery also enveloping the appendix.  Given the fact it may be carcinomatosis, this did not appear to be a GI primary and did not feel resection of this at this point in time would be prudent given he may require further other treatments.  There were no signs of bowel obstruction, therefore nothing was done about that.  I examined the ascending colon, transverse colon, descending colon, this appeared grossly normal.  Liver showed no signs of any mass, lesion, or seeding.  There is  diffuse scattered small peritoneal seeds.  There was some seeding on the terminal ileum as well as the right lower quadrant peritoneum adjacent to the cecum and again adjacent to the appendix.  At this point in time, we made sure we had good hemostasis at the biopsy site with Surgicel and cautery.  No signs of any injury to the intestine.  We then went ahead and removed our scope and ports allowing the CO2 to escape.  A 4-0 Monocryl was used to close the skin. Dermabond applied in all laparoscopic sites.  Foley and NG tube removed. The patient was then extubated, taken to recovery in satisfactory Condition. All counts correct.   EBL is less than 100 mL.     Vanden Fawaz A. Zeniya Lapidus, M.D.     TAC/MEDQ  D:  07/12/2013  T:  07/13/2013  Job:  [id number]  cc:   Heloise Purpura, MD

## 2013-07-15 ENCOUNTER — Encounter (HOSPITAL_COMMUNITY): Payer: Self-pay | Admitting: Surgery

## 2013-09-28 ENCOUNTER — Encounter (HOSPITAL_COMMUNITY): Payer: Self-pay | Admitting: Pharmacy Technician

## 2013-09-28 ENCOUNTER — Other Ambulatory Visit: Payer: Self-pay | Admitting: Urology

## 2013-09-29 ENCOUNTER — Encounter (HOSPITAL_COMMUNITY): Payer: Self-pay

## 2013-09-29 ENCOUNTER — Encounter (HOSPITAL_COMMUNITY)
Admission: RE | Admit: 2013-09-29 | Discharge: 2013-09-29 | Disposition: A | Discharge status: Home / Self Care | Payer: Medicare Other | Source: Ambulatory Visit | Attending: Urology | Admitting: Urology

## 2013-09-29 DIAGNOSIS — Z01812 Encounter for preprocedural laboratory examination: Secondary | ICD-10-CM | POA: Insufficient documentation

## 2013-09-29 HISTORY — DX: Calculus in bladder: N21.0

## 2013-09-29 HISTORY — DX: Gastro-esophageal reflux disease without esophagitis: K21.9

## 2013-09-29 LAB — CBC
HCT: 38.7 % — ABNORMAL LOW (ref 39.0–52.0)
Hemoglobin: 13 g/dL (ref 13.0–17.0)
MCH: 31.2 pg (ref 26.0–34.0)
MCHC: 33.6 g/dL (ref 30.0–36.0)
MCV: 92.8 fL (ref 78.0–100.0)
Platelets: 302 10*3/uL (ref 150–400)
RBC: 4.17 MIL/uL — ABNORMAL LOW (ref 4.22–5.81)
RDW: 12.6 % (ref 11.5–15.5)
WBC: 7.1 10*3/uL (ref 4.0–10.5)

## 2013-09-29 LAB — BASIC METABOLIC PANEL
BUN: 17 mg/dL (ref 6–23)
CALCIUM: 9.9 mg/dL (ref 8.4–10.5)
CO2: 26 mEq/L (ref 19–32)
CREATININE: 0.91 mg/dL (ref 0.50–1.35)
Chloride: 102 mEq/L (ref 96–112)
GFR calc Af Amer: >90 mL/min (ref >90)
GFR calc non Af Amer: >90 mL/min (ref >90)
Glucose, Bld: 109 mg/dL — ABNORMAL HIGH (ref 70–99)
Potassium: 5 mEq/L (ref 3.7–5.3)
SODIUM: 138 meq/L (ref 137–147)

## 2013-09-29 NOTE — Patient Instructions (Addendum)
[name]  09/29/2013                           YOUR PROCEDURE IS SCHEDULED ON:  10/03/13               PLEASE REPORT TO SHORT STAY CENTER AT : 1:15 pm               CALL THIS NUMBER IF ANY PROBLEMS THE DAY OF SURGERY :               [phone]                     REMEMBER:   Do not eat food or drink liquids AFTER MIDNIGHT   May have clear liquids UNTIL 6 HOURS BEFORE SURGERY (9:45 AM)     Take these medicines the morning of surgery with A SIP OF WATER: NUVIGIL / FLUOXETINE / PROTONIX   Do not wear jewelry, make-up   Do not wear lotions, powders, or perfumes.   Do not shave legs or underarms 12 hrs. before surgery (men may shave face)  Do not bring valuables to the hospital.  Contacts, dentures or bridgework may not be worn into surgery.  Leave suitcase in the car. After surgery it may be brought to your room.  For patients admitted to the hospital more than one night, checkout time is                        11:00 AM                       The day of discharge.   Patients discharged the day of surgery will not be allowed to drive home.              If going home same day of surgery, must have someone stay with you first              24 hrs at home and arrange for some one to drive you home from hospital.    Special Instructions:   Please read over the following fact sheets that you were given:               1. [place] PREPARING FOR SURGERY SHEET                2. BRING C PAP MASK AND TUBING TO HOSPITAL                                                X_____________________________________________________________________        Failure to follow these instructions may result in cancellation of your surgery                                                         Michael ConnersRobert L [name]  09/29/2013                           YOUR PROCEDURE IS SCHEDULED ON:  PLEASE REPORT TO SHORT STAY CENTER AT :               CALL THIS NUMBER IF ANY  PROBLEMS THE DAY OF SURGERY :               [phone]                   NO VISITORS UNDER AGE 55 DUE TO FLU RESTRICTIONS               REMEMBER:   Do not eat food or drink liquids AFTER MIDNIGHT   May have clear liquids UNTIL 6 HOURS BEFORE SURGERY     Take these medicines the morning of surgery with A SIP OF WATER:   Do not wear jewelry, make-up   Do not wear lotions, powders, or perfumes.   Do not shave legs or underarms 12 hrs. before surgery (men may shave face)  Do not bring valuables to the hospital.  Contacts, dentures or bridgework may not be worn into surgery.  Leave suitcase in the car. After surgery it may be brought to your room.  For patients admitted to the hospital more than one night, checkout time is                        11:00 AM                       The day of discharge.   Patients discharged the day of surgery will not be allowed to drive home.              If going home same day of surgery, must have someone stay with you first              24 hrs at home and arrange for some one to drive you home from hospital.    Special Instructions:   Please read over the following fact sheets that you were given:               1. [place]                                                X_____________________________________________________________________        Failure to follow these instructions may result in cancellation of your surgery                                                         [name]  09/29/2013                           YOUR PROCEDURE IS SCHEDULED ON:               PLEASE REPORT TO SHORT STAY CENTER AT :               CALL THIS NUMBER IF ANY PROBLEMS THE DAY OF SURGERY :               [phone]  NO VISITORS UNDER AGE 56 DUE TO FLU RESTRICTIONS               REMEMBER:   Do not eat food or drink liquids AFTER MIDNIGHT   May have clear liquids UNTIL 6 HOURS BEFORE  SURGERY     Take these medicines the morning of surgery with A SIP OF WATER:   Do not wear jewelry, make-up   Do not wear lotions, powders, or perfumes.   Do not shave legs or underarms 12 hrs. before surgery (men may shave face)  Do not bring valuables to the hospital.  Contacts, dentures or bridgework may not be worn into surgery.  Leave suitcase in the car. After surgery it may be brought to your room.  For patients admitted to the hospital more than one night, checkout time is                        11:00 AM                       The day of discharge.   Patients discharged the day of surgery will not be allowed to drive home.              If going home same day of surgery, must have someone stay with you first              24 hrs at home and arrange for some one to drive you home from hospital.    Special Instructions:   Please read over the following fact sheets that you were given:               1. [place] PREPARING FOR SURGERY SHEET                                                X_____________________________________________________________________        Failure to follow these instructions may result in cancellation of your surgery                                                         [name]  09/29/2013                           YOUR PROCEDURE IS SCHEDULED ON:               PLEASE REPORT TO SHORT STAY CENTER AT :               CALL THIS NUMBER IF ANY PROBLEMS THE DAY OF SURGERY :               [phone]                   NO VISITORS UNDER AGE 56 DUE TO FLU RESTRICTIONS               REMEMBER:   Do not eat food or drink liquids AFTER MIDNIGHT   May have clear liquids UNTIL 6 HOURS BEFORE SURGERY     Take these medicines the morning of surgery with  A SIP OF WATER:   Do not wear jewelry, make-up   Do not wear lotions, powders, or perfumes.   Do not shave legs or underarms 12 hrs. before surgery (men may shave face)  Do not bring  valuables to the hospital.  Contacts, dentures or bridgework may not be worn into surgery.  Leave suitcase in the car. After surgery it may be brought to your room.  For patients admitted to the hospital more than one night, checkout time is                        11:00 AM                       The day of discharge.   Patients discharged the day of surgery will not be allowed to drive home.              If going home same day of surgery, must have someone stay with you first              24 hrs at home and arrange for some one to drive you home from hospital.    Special Instructions:   Please read over the following fact sheets that you were given:               1.  PREPARING FOR SURGERY SHEET                                                X_____________________________________________________________________        Failure to follow these instructions may result in cancellation of your surgery                                                         [name]  09/29/2013                           YOUR PROCEDURE IS SCHEDULED ON:               PLEASE REPORT TO SHORT STAY CENTER AT :               CALL THIS NUMBER IF ANY PROBLEMS THE DAY OF SURGERY :               [phone]                                REMEMBER:   Do not eat food or drink liquids AFTER MIDNIGHT   May have clear liquids UNTIL 6 HOURS BEFORE SURGERY     Take these medicines the morning of surgery with A SIP OF WATER:   Do not wear jewelry, make-up   Do not wear lotions, powders, or perfumes.   Do not shave legs or underarms 12 hrs. before surgery (men may shave face)  Do not bring valuables to the hospital.  Contacts, dentures or bridgework may not be worn into surgery.  Leave suitcase in the car. After surgery it may be brought to  your room.  For patients admitted to the hospital more than one night, checkout time is                                    11:00 AM                        The day of discharge.   Patients discharged the day of surgery will not be allowed to drive home.              If going home same day of surgery, must have someone stay with you first              24 hrs at home and arrange for some one to drive you home from hospital.    Special Instructions:   Please read over the following fact sheets that you were given:               1. [place] PREPARING FOR SURGERY SHEET                                                X_____________________________________________________________________        Failure to follow these instructions may result in cancellation of your surgery                                                         [name]  09/29/2013                           YOUR PROCEDURE IS SCHEDULED ON:               PLEASE REPORT TO SHORT STAY CENTER AT :               CALL THIS NUMBER IF ANY PROBLEMS THE DAY OF SURGERY :               [phone]                                REMEMBER:   Do not eat food or drink liquids AFTER MIDNIGHT   May have clear liquids UNTIL 6 HOURS BEFORE SURGERY     Take these medicines the morning of surgery with A SIP OF WATER:   Do not wear jewelry, make-up   Do not wear lotions, powders, or perfumes.   Do not shave legs or underarms 12 hrs. before surgery (men may shave face)  Do not bring valuables to the hospital.  Contacts, dentures or bridgework may not be worn into surgery.  Leave suitcase in the car. After surgery it may be brought to your room.  For patients admitted to the hospital more than one night, checkout time is                                    11:00 AM  The day of discharge.   Patients discharged the day of surgery will not be allowed to drive home.              If going home same day of surgery, must have someone stay with you first              24 hrs at home and arrange for some one to drive you home from hospital.    Special  Instructions:   Please read over the following fact sheets that you were given:               1. [place]                                                X_____________________________________________________________________        Failure to follow these instructions may result in cancellation of your surgery

## 2013-09-30 NOTE — H&P (Signed)
History of Present Illness [name] is a 58 year old previously under the care of Dr. Tresa Endo. He has the following urologic history:    1) Prostate cancer: He was initially diagnosed with prostate cancer in 2000 in [place] but refused therapy at that time. He presented to Dr. Rosana Hoes in 2007 and underwent a repeat prostate biopsy which indicated progression of his disease. He is s/p a BNS robotic prostatectomy in April 2007 by Dr. Rosana Hoes. He was found to have lymph node negative, locally advanced adenocarcinoma of the prostate with a positive surgical margin. He was initially managed with close PSA surveillance/observation but had a rising PSA which reached 0.11 in December 2009. This prompted salvage radiation therapy (68.4 Gy) under the care of Dr. Tammi Klippel which he completed in May 2010. A bone scan in July 2010 was performed to rule out bone metastases prior to his cervical spine surgery and was negative. His PSA continued to increase following radiation therapy and has been followed.    Diagnosis: pT3a N0 Mx, Gleason 3+4=7 adenocarcinoma with a focal positive surgical margin s/p radical prostatectomy and salvage radiation therapy with rising PSA  Pretreatment PSA: 4.83    2) Incontinence: He developed stress incontinence after his surgery. He underwent a male sling by Dr. Matilde Sprang in March 2011 and developed a postoperative perineal abscess requiring drainage. He has been re-evaluated by Dr. Matilde Sprang and has contemplated an artificial sphincter for further treatment.    3) Erectile dysfunction  Current treatment: Trimix    4) Gross hematuria: He was found to have an erythematous lesion on the left side of the bladder during cystoscopic evaluation. Biopsies in November 2011 were negative for malignancy. It was felt that this was likely radiation changes. He has no family history of urothelial carcinoma. He does have a 40+ pack smoking history.    Interval history:     He  follows up today after developing new onset gross hematuria over the past 3 weeks. He has denied any pain with urination including no flank pain or dysuria. He denies any abdominal pain or fever. He has noticed this intermittently. He has not passed any stones. He continues to have stable incontinence and does not fee that he has had any difficulty emptying his bladder.l     Past Medical History Problems  1. History of heartburn (V12.79) 2. History of hypercholesterolemia (V12.29) 3. History of sleep apnea (V13.89) 4. Prostate cancer (185)  Surgical History Problems  1. History of Cystoscopy With Biopsy 2. History of Cystoscopy With Removal Of Object 3. History of Hand Repair 4. History of Prostatectomy Robotic-Assisted  Current Meds 1. Abilify 5 MG Oral Tablet;  Therapy: (Recorded:16Oct2012) to Recorded 2. Desipramine HCl - 50 MG Oral Tablet;  Therapy: (Recorded:03Feb2015) to Recorded 3. FLUoxetine HCl - 60 MG Oral Tablet;  Therapy: [id number] to Recorded 4. Multivitamins TABS;  Therapy: (Recorded:25Feb2008) to Recorded 5. Nuvigil 150 MG Oral Tablet;  Therapy: [id number] to Recorded 6. Pantoprazole Sodium 40 MG Oral Tablet Delayed Release;  Therapy: 03Oct2014 to Recorded 7. Simvastatin 40 MG Oral Tablet;  Therapy: 22Jul2010 to Recorded 8. Trilipix 135 MG Oral Capsule Delayed Release;  Therapy: 20Aug2010 to Recorded 9. Trimix (PGE 20, PAP 30, PHE 0.5); Trimix (PGE 20 mcg, Papaverine 30 mg,  Phentolamine 0.5 mg).  Please give patient 5 mL vial.  Please provide syringes and  alcohol swabs;  Therapy: [phone] to (Last Rx:16Oct2012)  Requested for: 16Oct2012 Ordered 10. Vasotec TABS;   Therapy: (Recorded:03Feb2015) to Recorded  Allergies Medication  1. Penicillins  Family History Problems  1. Family history of Diabetes Mellitus (V18.0) : Mother 2. Family history of Diabetes Mellitus (V18.0) : Father 3. Family history of Heart Disease (V17.49) : Mother  Social  History Problems  1. Current every day smoker (305.1) 2. Marital History - Currently Married 3. Occupation:   truck Geophysicist/field seismologist 4. Tobacco Use (V15.82)   has smoked 1 1/2 packs per day for 39 years  Vitals Vital Signs [Data Includes: Last 1 Day]  Recorded: 03Feb2015 09:40AM  Height: 6 ft 1 in Weight: 275 lb  BMI Calculated: 36.28 BSA Calculated: 2.46 Blood Pressure: 119 / 76 Temperature: 97.6 F Heart Rate: 77  Physical Exam Constitutional: Well nourished and well developed . No acute distress.  ENT:. The ears and nose are normal in appearance.  Neck: The appearance of the neck is normal and no neck mass is present.  Pulmonary: No respiratory distress and normal respiratory rhythm and effort.  Cardiovascular: Heart rate and rhythm are normal . No peripheral edema.  Abdomen: The abdomen is soft and nontender. No CVA tenderness.  Genitourinary: Examination of the penis demonstrates no lesions and a normal meatus.    Results/Data Urine [Data Includes: Last 1 Day]   [id number]  COLOR YELLOW   APPEARANCE CLOUDY   SPECIFIC GRAVITY 1.015   pH 7.0   GLUCOSE NEG mg/dL  BILIRUBIN NEG   KETONE NEG mg/dL  BLOOD NEG   PROTEIN NEG mg/dL  UROBILINOGEN 0.2 mg/dL  NITRITE NEG   LEUKOCYTE ESTERASE NEG   SQUAMOUS EPITHELIAL/HPF NONE SEEN   WBC NONE SEEN WBC/hpf  RBC NONE SEEN RBC/hpf  BACTERIA NONE SEEN   CRYSTALS NONE SEEN   CASTS NONE SEEN   Other AMORPHOUS NOTED    Procedure  Procedure: Cystoscopy   Indication: Hematuria.  Informed Consent: Risks, benefits, and potential adverse events were discussed and informed consent was obtained from the patient.  Prep: The patient was prepped with betadine.  Anesthesia:. Local anesthesia was administered intraurethrally with 2% lidocaine jelly.  Antibiotic prophylaxis: Ciprofloxacin.  Procedure Note:  Urethral meatus:. No abnormalities.  Anterior urethra: No abnormalities.  Prostatic urethra:. Surgically absent.  Bladder: Systematic  examination of the bladder revealed a stone located at the right anterior bladder neck. The majority of the stone was able to be brushed off with the cystoscope. Although I could not visualize an underlying foreign body, there did appear to be an area that I could feel with the scope that suggested possibly an underlying stapled the bladder neck. A systematic examination of the bladder was then performed. The ureteral orifices were in their expected anatomic location and were effluxing clear urine. There were no bladder tumors or stones. He did have some new mucosal edema/erythema near the bladder neck especially anteriorly. This was consistent with his known radiation cystitis but nothing that suggested malignancy. The patient tolerated the procedure well.  Complications: None.    Assessment Assessed  1. Prostate cancer (185) 2. Gross hematuria (599.71) 3. Bladder calculus (594.1)  Plan Bladder calculus  1. Follow-up Schedule Surgery Office  Follow-up  Status: Complete  Done: [id number] Gross hematuria  2. Cysto; Status:Hold For - Appointment,Date of Service; Requested for:03Feb2015;  Health Maintenance  3. UA With REFLEX; [Do Not Release]; Status:Complete;   Done: [id number] 09:31AM  Discussion/Summary 1. Metastatic prostate cancer: He has been tolerating his androgen deprivation therapy relatively well aside from mild hot flashes. He will keep his scheduled appointment in April follow his PSA and for  further treatment.    2. Testosterone deficiency/bone health: He is scheduled for a baseline bone density study at his next appointment.    3. Hematuria: This appears to be related to a bladder calculus identified at his bladder neck. I am suspicious that there may be an underlying foreign body and recommended proceeding with cystoscopy and removal of any underlying body and his bladder neck calculus under anesthesia. We reviewed the potential risks and complications of this procedure as well  as the expected recovery process. He understands the importance of having this performed if he is to consider an artificial urinary sphincter in the future.    4. Incontinence: I recommended that he consider contacting Dr. Matilde Sprang to consider an appointment in the 2-3 months for further evaluation and consideration of an artificial urinary sphincter. He could have a repeat cystoscopy performed at that time to ensure proper healing after his upcoming cystoscopic procedure.    Cc: Dr. Nicki Reaper MacDiarmid  Dr. Redmond School  Dr. Tyler Pita     Signatures Electronically signed by : Raynelle Bring, M.D.; Sep 27 2013 10:26AM EST

## 2013-10-03 ENCOUNTER — Encounter (HOSPITAL_COMMUNITY)
Admission: RE | Disposition: A | Discharge status: Home / Self Care | Payer: Self-pay | Source: Ambulatory Visit | Attending: Urology

## 2013-10-03 ENCOUNTER — Encounter (HOSPITAL_COMMUNITY): Payer: Self-pay | Admitting: *Deleted

## 2013-10-03 ENCOUNTER — Ambulatory Visit (HOSPITAL_COMMUNITY)
Admission: RE | Admit: 2013-10-03 | Discharge: 2013-10-03 | Disposition: A | Discharge status: Home / Self Care | Payer: Medicare Other | Source: Ambulatory Visit | Attending: Urology | Admitting: Urology

## 2013-10-03 ENCOUNTER — Encounter (HOSPITAL_COMMUNITY): Payer: Medicare Other | Admitting: Anesthesiology

## 2013-10-03 ENCOUNTER — Ambulatory Visit (HOSPITAL_COMMUNITY): Payer: Medicare Other | Admitting: Anesthesiology

## 2013-10-03 DIAGNOSIS — N393 Stress incontinence (female) (male): Secondary | ICD-10-CM | POA: Insufficient documentation

## 2013-10-03 DIAGNOSIS — K219 Gastro-esophageal reflux disease without esophagitis: Secondary | ICD-10-CM | POA: Insufficient documentation

## 2013-10-03 DIAGNOSIS — I10 Essential (primary) hypertension: Secondary | ICD-10-CM | POA: Insufficient documentation

## 2013-10-03 DIAGNOSIS — G4730 Sleep apnea, unspecified: Secondary | ICD-10-CM | POA: Insufficient documentation

## 2013-10-03 DIAGNOSIS — J449 Chronic obstructive pulmonary disease, unspecified: Secondary | ICD-10-CM | POA: Insufficient documentation

## 2013-10-03 DIAGNOSIS — Y842 Radiological procedure and radiotherapy as the cause of abnormal reaction of the patient, or of later complication, without mention of misadventure at the time of the procedure: Secondary | ICD-10-CM | POA: Insufficient documentation

## 2013-10-03 DIAGNOSIS — C801 Malignant (primary) neoplasm, unspecified: Secondary | ICD-10-CM | POA: Insufficient documentation

## 2013-10-03 DIAGNOSIS — N210 Calculus in bladder: Secondary | ICD-10-CM | POA: Insufficient documentation

## 2013-10-03 DIAGNOSIS — N3040 Irradiation cystitis without hematuria: Secondary | ICD-10-CM | POA: Insufficient documentation

## 2013-10-03 DIAGNOSIS — F17200 Nicotine dependence, unspecified, uncomplicated: Secondary | ICD-10-CM | POA: Insufficient documentation

## 2013-10-03 DIAGNOSIS — I739 Peripheral vascular disease, unspecified: Secondary | ICD-10-CM | POA: Insufficient documentation

## 2013-10-03 DIAGNOSIS — E291 Testicular hypofunction: Secondary | ICD-10-CM | POA: Insufficient documentation

## 2013-10-03 DIAGNOSIS — E7800 Pure hypercholesterolemia, unspecified: Secondary | ICD-10-CM | POA: Insufficient documentation

## 2013-10-03 DIAGNOSIS — Z79899 Other long term (current) drug therapy: Secondary | ICD-10-CM | POA: Insufficient documentation

## 2013-10-03 DIAGNOSIS — R310 Gross hematuria: Secondary | ICD-10-CM | POA: Insufficient documentation

## 2013-10-03 DIAGNOSIS — N529 Male erectile dysfunction, unspecified: Secondary | ICD-10-CM | POA: Insufficient documentation

## 2013-10-03 DIAGNOSIS — C61 Malignant neoplasm of prostate: Secondary | ICD-10-CM | POA: Insufficient documentation

## 2013-10-03 DIAGNOSIS — J4489 Other specified chronic obstructive pulmonary disease: Secondary | ICD-10-CM | POA: Insufficient documentation

## 2013-10-03 HISTORY — PX: CYSTOSCOPY W/ URETERAL STENT PLACEMENT: SHX1429

## 2013-10-03 HISTORY — PX: HOLMIUM LASER APPLICATION: SHX5852

## 2013-10-03 SURGERY — CYSTOSCOPY, WITH RETROGRADE PYELOGRAM AND URETERAL STENT INSERTION
Anesthesia: General | Site: Bladder

## 2013-10-03 MED ORDER — MIDAZOLAM HCL 2 MG/2ML IJ SOLN
INTRAMUSCULAR | Status: AC
  Filled 2013-10-03: qty 2

## 2013-10-03 MED ORDER — SODIUM CHLORIDE 0.9 % IJ SOLN
INTRAMUSCULAR | Status: AC
  Filled 2013-10-03: qty 10

## 2013-10-03 MED ORDER — CIPROFLOXACIN IN D5W 400 MG/200ML IV SOLN
400.0000 mg | INTRAVENOUS | Status: AC
  Administered 2013-10-03: 400 mg via INTRAVENOUS

## 2013-10-03 MED ORDER — LACTATED RINGERS IV SOLN
INTRAVENOUS | Status: DC
  Administered 2013-10-03: 15:00:00 via INTRAVENOUS

## 2013-10-03 MED ORDER — MIDAZOLAM HCL 5 MG/5ML IJ SOLN
INTRAMUSCULAR | Status: DC | PRN
  Administered 2013-10-03: 2 mg via INTRAVENOUS

## 2013-10-03 MED ORDER — LIDOCAINE HCL (PF) 2 % IJ SOLN
INTRAMUSCULAR | Status: DC | PRN
  Administered 2013-10-03: 75 mg via INTRADERMAL

## 2013-10-03 MED ORDER — FENTANYL CITRATE 0.05 MG/ML IJ SOLN
INTRAMUSCULAR | Status: DC | PRN
  Administered 2013-10-03 (×4): 50 ug via INTRAVENOUS

## 2013-10-03 MED ORDER — HYDROMORPHONE HCL PF 1 MG/ML IJ SOLN: 0.2500 mg | INTRAMUSCULAR | Status: DC | PRN

## 2013-10-03 MED ORDER — CIPROFLOXACIN HCL 500 MG PO TABS
500.0000 mg | ORAL_TABLET | Freq: Two times a day (BID) | ORAL | Status: DC
Start: 2013-10-03 — End: 2014-01-19

## 2013-10-03 MED ORDER — FENTANYL CITRATE 0.05 MG/ML IJ SOLN
INTRAMUSCULAR | Status: AC
  Filled 2013-10-03: qty 2

## 2013-10-03 MED ORDER — CIPROFLOXACIN IN D5W 400 MG/200ML IV SOLN
INTRAVENOUS | Status: AC
  Filled 2013-10-03: qty 200

## 2013-10-03 MED ORDER — ONDANSETRON HCL 4 MG/2ML IJ SOLN
INTRAMUSCULAR | Status: DC | PRN
  Administered 2013-10-03: 4 mg via INTRAVENOUS

## 2013-10-03 MED ORDER — PROPOFOL 10 MG/ML IV BOLUS
INTRAVENOUS | Status: DC | PRN
  Administered 2013-10-03: 200 mg via INTRAVENOUS

## 2013-10-03 MED ORDER — ONDANSETRON HCL 4 MG/2ML IJ SOLN
INTRAMUSCULAR | Status: AC
  Filled 2013-10-03: qty 2

## 2013-10-03 MED ORDER — 0.9 % SODIUM CHLORIDE (POUR BTL) OPTIME
TOPICAL | Status: DC | PRN
  Administered 2013-10-03: 1000 mL

## 2013-10-03 MED ORDER — PROMETHAZINE HCL 25 MG/ML IJ SOLN: 6.2500 mg | INTRAMUSCULAR | Status: DC | PRN

## 2013-10-03 MED ORDER — LIDOCAINE HCL (CARDIAC) 20 MG/ML IV SOLN
INTRAVENOUS | Status: AC
  Filled 2013-10-03: qty 5

## 2013-10-03 MED ORDER — PROPOFOL 10 MG/ML IV BOLUS
INTRAVENOUS | Status: AC
  Filled 2013-10-03: qty 20

## 2013-10-03 MED ORDER — HYDROCODONE-ACETAMINOPHEN 5-325 MG PO TABS: 1.0000 | ORAL_TABLET | Freq: Four times a day (QID) | ORAL | Status: DC | PRN

## 2013-10-03 MED ORDER — SODIUM CHLORIDE 0.9 % IR SOLN
Status: DC | PRN
  Administered 2013-10-03: 6000 mL

## 2013-10-03 MED ORDER — LACTATED RINGERS IV SOLN: INTRAVENOUS | Status: DC

## 2013-10-03 SURGICAL SUPPLY — 21 items
BAG URO CATCHER STRL LF (DRAPE) ×3 IMPLANT
BASKET ZERO TIP NITINOL 2.4FR (BASKET) IMPLANT
BRIEF STRETCH FOR OB PAD LRG (UNDERPADS AND DIAPERS) ×2 IMPLANT
BSKT STON RTRVL ZERO TP 2.4FR (BASKET)
CATH INTERMIT  6FR 70CM (CATHETERS) ×2 IMPLANT
CLOTH BEACON ORANGE TIMEOUT ST (SAFETY) ×3 IMPLANT
DRAPE CAMERA CLOSED 9X96 (DRAPES) ×3 IMPLANT
DRSG PAD ABDOMINAL 8X10 ST (GAUZE/BANDAGES/DRESSINGS) ×2 IMPLANT
FIBER LASER FLEXIVA 365 (UROLOGICAL SUPPLIES) ×2 IMPLANT
GLOVE BIOGEL M STRL SZ7.5 (GLOVE) ×3 IMPLANT
GLOVE BIOGEL PI IND STRL 7.0 (GLOVE) IMPLANT
GLOVE BIOGEL PI INDICATOR 7.0 (GLOVE) ×2
GOWN STRL REUS W/TWL LRG LVL3 (GOWN DISPOSABLE) ×6 IMPLANT
GUIDEWIRE ANG ZIPWIRE 038X150 (WIRE) IMPLANT
GUIDEWIRE STR DUAL SENSOR (WIRE) ×5 IMPLANT
IV NS IRRIG 3000ML ARTHROMATIC (IV SOLUTION) ×2 IMPLANT
MANIFOLD NEPTUNE II (INSTRUMENTS) ×3 IMPLANT
NS IRRIG 1000ML POUR BTL (IV SOLUTION) ×2 IMPLANT
PACK CYSTO (CUSTOM PROCEDURE TRAY) ×3 IMPLANT
TUBING CONNECTING 10 (TUBING) ×2 IMPLANT
TUBING CONNECTING 10' (TUBING) ×1

## 2013-10-03 NOTE — Anesthesia Preprocedure Evaluation (Addendum)
Anesthesia Evaluation  Patient identified by MRN, date of birth, ID band Patient awake    Reviewed: Allergy & Precautions, H&P , NPO status , Patient's Chart, lab work & pertinent test results  History of Anesthesia Complications Negative for: history of anesthetic complications  Airway Mallampati: II TM Distance: >3 FB Neck ROM: Full    Dental  (+) Missing, Poor Dentition and Dental Advisory Given,    Pulmonary sleep apnea and Continuous Positive Airway Pressure Ventilation , COPDCurrent Smoker,  breath sounds clear to auscultation  Pulmonary exam normal       Cardiovascular hypertension, Pt. on medications - angina+ Peripheral Vascular Disease Rhythm:Regular Rate:Normal  '12 cath: normal coronaries, normal LVF   Neuro/Psych Anxiety Depression negative neurological ROS     GI/Hepatic Neg liver ROS, GERD-  Medicated and Controlled,  Endo/Other  Morbid obesity  Renal/GU negative Renal ROS     Musculoskeletal   Abdominal (+) + obese,   Peds  Hematology negative hematology ROS (+)   Anesthesia Other Findings   Reproductive/Obstetrics                        Anesthesia Physical Anesthesia Plan  ASA: III  Anesthesia Plan: General   Post-op Pain Management:    Induction: Intravenous  Airway Management Planned: LMA  Additional Equipment:   Intra-op Plan:   Post-operative Plan: Extubation in OR  Informed Consent: I have reviewed the patients History and Physical, chart, labs and discussed the procedure including the risks, benefits and alternatives for the proposed anesthesia with the patient or authorized representative who has indicated his/her understanding and acceptance.   Dental advisory given  Plan Discussed with: CRNA  Anesthesia Plan Comments:        Anesthesia Quick Evaluation

## 2013-10-03 NOTE — Transfer of Care (Signed)
Immediate Anesthesia Transfer of Care Note  Patient: [name]  Procedure(s) Performed: Procedure(s) with comments: CYSTOSCOPY AND REMOVAL OF BLADDER NECK STONE  (N/A) - WITH REMOVAL OF BLADDER NECK STONE   HOLMIUM LASER APPLICATION (N/A)  Patient Location: PACU  Anesthesia Type:General  Level of Consciousness: awake, alert  and patient cooperative  Airway & Oxygen Therapy: Patient Spontanous Breathing and Patient connected to face mask oxygen  Post-op Assessment: Report given to PACU RN and Post -op Vital signs reviewed and stable  Post vital signs: Reviewed and stable  Complications: No apparent anesthesia complications

## 2013-10-03 NOTE — Interval H&P Note (Signed)
History and Physical Interval Note:  10/03/2013 3:09 PM  [name]  has presented today for surgery, with the diagnosis of BLADDER NECK CALCULUS, POSSIBLE FOREIGN BODY IN URETHRA  The various methods of treatment have been discussed with the patient and family. After consideration of risks, benefits and other options for treatment, the patient has consented to  Procedure(s) with comments: [place] / FOREIGN BODY (N/A) - WITH REMOVAL OF BLADDER NECK STONE / [place]  as a surgical intervention .  The patient's history has been reviewed, patient examined, no change in status, stable for surgery.  I have reviewed the patient's chart and labs.  Questions were answered to the patient's satisfaction.     Parissa Chiao,LES

## 2013-10-03 NOTE — Op Note (Signed)
Preoperative diagnosis:  1. Urinary incontinence 2.  Bladder neck calculus  Postoperative diagnosis: 1. Urinary incontinence 2.  Bladder neck calculus  Procedure(s): 1. Cystoscopy 2.  Holmium laser lithotripsy of bladder neck calculus  Surgeon: Dr. Roxy Horseman, Jr  Anesthesia: Gen.  Complications: None  EBL: Minimal  Specimens: Bladder neck calculus  Disposition of specimens: Given to family  Indication: [name] is a 58 year old gentleman with a history of metastatic prostate cancer and urinary incontinence who is status post a male urethral sling status post removal for infection.  He is now considering an artificial urinary sphincter but was found to have hematuria and underwent further evaluation including office cystoscopy which revealed a calculus anteriorly at the bladder neck.  This raised concern about a possible foreign body.  We therefore discussed proceeding with cystoscopic resection/removal of the calculus and any underlying foreign body that may be present as an etiology for this stone.  The potential risks, complications, alternative options, and expected recovery process were discussed in detail and informed consent obtained.  Description of procedure:  The patient was taken to the operating room and a general anesthetic was administered.  He was given preoperative antibiotics, placed in the dorsal lithotomy position, and prepped and draped in the usual sterile fashion.  Next, a preoperative timeout was performed.  Cystourethroscopy was performed which revealed a normal anterior urethra.  Prostatic urethra was surgically absent.  Urinary sphincter was widely patent.  Toward the bladder neck, there was stone material seen extending from approximately 10:00 to 12:00 anteriorly.  Most of the stone was able to be simply knocked off the mucosa with the scope.  The bladder was then systematically examined and there was no evidence of any bladder tumors, stones, or  other mucosal pathology.  The ureteral orifices were in their expected anatomic location and were effluxing clear urine.  Attention and return of the bladder neck which was examined with a 30 and a 70 lens.  Careful inspection revealed no mesh erosion from the prior sling.  No underlying foreign body or staple could be identified either.  I then removed the rigid cystoscope and performed flexible cystoscopy to try to get a better look.  This revealed what I thought might represent an underlying staple anteriorly.  I therefore replaced the rigid cystoscope and used a 365  holmium laser fiber to perform laser lithotripsy of the remaining stone material.  Once all the stone was removed, I carefully inspected the underlying tissue and no foreign body could be identified despite an exhaustive evaluation.  At this point, all stone had been lasered or removed.  It is my hope that this area will epithelialize allowing him to proceed with an artificial urinary sphincter in the near future.  I will relay my findings to Dr. Matilde Sprang and plan schedule the patient for follow-up for repeat cystoscopy with Dr. Matilde Sprang in approximately 8 weeks.  The patient tolerated the procedure well without complications.  He was able to be extubated and transferred to the recovery unit in satisfactory condition.

## 2013-10-03 NOTE — Anesthesia Postprocedure Evaluation (Signed)
Anesthesia Post Note  Patient: [name]  Procedure(s) Performed: Procedure(s) (LRB): CYSTOSCOPY AND REMOVAL OF BLADDER NECK STONE  (N/A) HOLMIUM LASER APPLICATION (N/A)  Anesthesia type: General  Patient location: PACU  Post pain: Pain level controlled  Post assessment: Post-op Vital signs reviewed  Last Vitals:  Filed Vitals:   10/03/13 1726  BP: 129/86  Pulse: 70  Temp: 36.5 C  Resp: 16    Post vital signs: Reviewed  Level of consciousness: sedated  Complications: No apparent anesthesia complications

## 2013-10-03 NOTE — Discharge Instructions (Signed)
1. You may see some blood in the urine and may have some burning with urination for 48-72 hours. You also may notice that you have to urinate more frequently or urgently after your procedure which is normal.  °2. You should call should you develop an inability urinate, fever > 101, persistent nausea and vomiting that prevents you from eating or drinking to stay hydrated.  °

## 2013-10-04 ENCOUNTER — Encounter (HOSPITAL_COMMUNITY): Payer: Self-pay | Admitting: Urology

## 2013-10-05 ENCOUNTER — Ambulatory Visit (INDEPENDENT_AMBULATORY_CARE_PROVIDER_SITE_OTHER): Payer: Medicare Other | Admitting: Cardiovascular Disease

## 2013-10-05 ENCOUNTER — Encounter: Payer: Self-pay | Admitting: Cardiovascular Disease

## 2013-10-05 VITALS — BP 112/72 | HR 74 | Resp 16 | Ht 74.0 in | Wt 281.6 lb

## 2013-10-05 DIAGNOSIS — I10 Essential (primary) hypertension: Secondary | ICD-10-CM

## 2013-10-05 DIAGNOSIS — E782 Mixed hyperlipidemia: Secondary | ICD-10-CM

## 2013-10-05 NOTE — Patient Instructions (Signed)
Continue current medications.  Your physician recommends that you schedule a follow-up appointment in: ONE YEAR with Dr. Croitoru.  

## 2013-10-10 ENCOUNTER — Encounter: Payer: Self-pay | Admitting: Cardiovascular Disease

## 2013-10-10 NOTE — Progress Notes (Signed)
Patient ID: [name], male   DOB: 1956/08/20, 58 y.o.   MRN: [id number]      Reason for office visit Attention, mixed hyperlipidemia, COPD  [name] is doing well and he successfully quit smoking since last July. Chantix was a great help for him and he no longer needs it. He has treated mixed hyperlipidemia (especially prominent low HDL cholesterol) and treated hypertension. He remains obese with a BMI that has remains essentially unchanged at 36. He has obstructive sleep apnea treated with both CPAP and Nuvigil. Coronary angiography in 2011 did not show any evidence of CAD and he had normal filling pressures by right and left heart catheterization. Unfortunately he has been diagnosed with prostate cancer that is recurrent. There was evidence of involvement of his appendix and cecum by CT but when he underwent laparotomy and was found that there was more extensive peritoneal spread and he tumor could not be surgically resected. He is to start androgen deprivation therapy. He is also scheduled to undergo an outpatient cystoscopy on Monday for problems with urinary retention and hematuria.  Allergies  Allergen Reactions  . Penicillins Anaphylaxis    Current Outpatient Prescriptions  Medication Sig Dispense Refill  . ARIPiprazole (ABILIFY) 5 MG tablet Take 5 mg by mouth at bedtime.      . Armodafinil (NUVIGIL) 150 MG tablet Take 75 mg by mouth daily.      . Calcium Carb-Cholecalciferol (CALCIUM 1000 + D PO) Take 1 tablet by mouth 2 (two) times daily.       . Choline Fenofibrate (TRILIPIX) 135 MG capsule Take 135 mg by mouth daily.      . ciprofloxacin (CIPRO) 500 MG tablet Take 1 tablet (500 mg total) by mouth 2 (two) times daily.  6 tablet  0  . Coenzyme Q10 (CO Q 10) 100 MG CAPS Take 1 capsule by mouth daily.      [name] desipramine (NOPRAMIN) 100 MG tablet Take 100 mg by mouth at bedtime.      . enalapril (VASOTEC) 10 MG tablet Take 10 mg by mouth every morning.       [name] FLUoxetine HCl 60 MG  TABS Take 60 mg by mouth every morning.       [name] HYDROcodone-acetaminophen (NORCO/VICODIN) 5-325 MG per tablet Take 1-2 tablets by mouth every 6 (six) hours as needed.  25 tablet  0  . Krill Oil 300 MG CAPS Take 300 mg by mouth daily.      . Multiple Vitamin (MULTIVITAMIN WITH MINERALS) TABS tablet Take 1 tablet by mouth daily.      . pantoprazole (PROTONIX) 40 MG tablet Take 40 mg by mouth daily.      . simvastatin (ZOCOR) 40 MG tablet Take 40 mg by mouth every evening.       No current facility-administered medications for this visit.    Past Medical History  Diagnosis Date  . Hypertension   . Shortness of breath   . Anxiety   . Urinary incontinence     due to prostate removal  . Hypercholesteremia   . Arthritis   . Sleep apnea     wears CPAP nightly  . Decreased pulse 08/16/2008    LE doppler - bilateral ABIs normal, bilateral PVRs normal waveform; bilateral common iliac arteries difficult to visualize however velocities suggest less than 50% diameter reduction; bilateral LE normal velocities w/o evidence of diameter reduction  . COPD (chronic obstructive pulmonary disease)   . Tobacco abuse   . Cancer 2007  prostate CA  . Colon cancer   . GERD (gastroesophageal reflux disease)   . Bladder stone   . Mental disorder     PTSD  . Depression   . Peripheral vascular disease     Past Surgical History  Procedure Laterality Date  . Cervical fusion    . Prostate surgery    . Bladder suspension      x 2  . Wrist surgery Right   . Colonoscopy    . Cardiac catheterization      no PCI  . Anterior lat lumbar fusion N/A 11/17/2012    Procedure: XLIF L3 - L4 POSTERIOR L3 -L4 FUSION INSTRUMENTATION/L2 - L3 DECOMPRESSION (REVISION OF POSTERIOR HARDWARE) 1 LEVEL;  Surgeon: Melina Schools, MD;  Location: [facility];  Service: Orthopedics;  Laterality: N/A;  procedure #1: start 0923, end 1057.   . Transthoracic echocardiogram  08/16/2008    essentially normal for age  . Cardiovascular  stress test  08/16/2008    EF 57%; inferior region has diaphragmatic attenuation, otherwise normal perfusion w/o evidence of ischemia or infarct; LV normal in size; no scintographic evidence of inducible ischemia; no ECG changes, negative for ischemia; low risk scan  . Colonoscopy N/A 05/27/2013    Procedure: COLONOSCOPY;  Surgeon: Daneil Dolin, MD;  [facility];  Service: Endoscopy;  Laterality: N/A;  1:15  . Esophagogastroduodenoscopy (egd) with esophageal dilation N/A 05/27/2013    Procedure: ESOPHAGOGASTRODUODENOSCOPY (EGD) WITH ESOPHAGEAL DILATION;  Surgeon: Daneil Dolin, MD;  Location: [facility];  Service: Endoscopy;  Laterality: N/A;  . Colon resection N/A 07/12/2013    Procedure: DIAGNOSTIC LAPAROSCOPY; PERITONEAL NODULE;  Surgeon: Joyice Faster. Cornett, MD;  Location: [facility];  Service: General;  Laterality: N/A;  . Back surgery  2010 / 2014  . Cystoscopy w/ ureteral stent placement N/A 10/03/2013    Procedure: CYSTOSCOPY AND REMOVAL OF BLADDER NECK STONE ;  Surgeon: Dutch Gray, MD;  Location: [facility];  Service: Urology;  Laterality: N/A;  WITH REMOVAL OF BLADDER NECK STONE    . Holmium laser application N/A [id number]    Procedure: HOLMIUM LASER APPLICATION;  Surgeon: Dutch Gray, MD;  Location: [facility];  Service: Urology;  Laterality: N/A;    Family History  Problem Relation Age of Onset  . Diabetes Mother   . Hypertension Mother   . Heart disease Mother   . Diabetes Father   . Hypertension Father   . Hypertension Brother   . Diabetes Brother   . Colon cancer Neg Hx   . Colon polyps Father     age 85    History   Social History  . Marital Status: Married    Spouse Name: N/A    Number of Children: N/A  . Years of Education: N/A   Occupational History  . Not on file.   Social History Main Topics  . Smoking status: Former Smoker -- 0.25 packs/day for 43 years    Types: Cigarettes  . Smokeless tobacco: Never Used     Comment: 02/28/13 quit  . Alcohol Use: No    . Drug Use: No  . Sexual Activity: Not on file   Other Topics Concern  . Not on file   Social History Narrative  . No narrative on file    Review of systems: The patient specifically denies any chest pain at rest or with exertion, dyspnea at rest or with exertion, orthopnea, paroxysmal nocturnal dyspnea, syncope, palpitations, focal neurological deficits, intermittent claudication, lower extremity  edema, unexplained weight gain, cough, hemoptysis or wheezing.  The patient also denies abdominal pain, nausea, vomiting, dysphagia, diarrhea, constipation, polyuria, polydipsia, dysuria, hematuria, frequency, urgency, abnormal bleeding or bruising, fever, chills, unexpected weight changes, mood swings, change in skin or hair texture, change in voice quality, auditory or visual problems, allergic reactions or rashes, new musculoskeletal complaints other than usual "aches and pains".  PHYSICAL EXAM BP 112/72  Pulse 74  Resp 16  Ht 6\' 2"  (1.88 m)  Wt 127.733 kg (281 lb 9.6 oz)  BMI 36.14 kg/m2  General: Alert, oriented x3, no distress Head: no evidence of trauma, PERRL, EOMI, no exophtalmos or lid lag, no myxedema, no xanthelasma; normal ears, nose and oropharynx Neck: normal jugular venous pulsations and no hepatojugular reflux; brisk carotid pulses without delay and no carotid bruits Chest: clear to auscultation, no signs of consolidation by percussion or palpation, normal fremitus, symmetrical and full respiratory excursions Cardiovascular: Obesity precludes localization of the apical impulse, regular rhythm, normal first and second heart sounds, no murmurs, rubs or gallops Abdomen: no tenderness or distention, no masses by palpation, no abnormal pulsatility or arterial bruits, normal bowel sounds, no hepatosplenomegaly Extremities: no clubbing, cyanosis or edema; 2+ radial, ulnar and brachial pulses bilaterally; 2+ right femoral, posterior tibial and dorsalis pedis pulses; 2+ left femoral,  posterior tibial and dorsalis pedis pulses; no subclavian or femoral bruits Neurological: grossly nonfocal    EKG: Normal sinus rhythm, normal July 2013 total cholesterol 142, triglycerides 109, HDL 27, LDL 93  BMET    Component Value Date/Time   NA 138 09/29/2013 0940   K 5.0 09/29/2013 0940   CL 102 09/29/2013 0940   CO2 26 09/29/2013 0940   GLUCOSE 109* 09/29/2013 0940   BUN 17 09/29/2013 0940   CREATININE 0.91 09/29/2013 0940   CALCIUM 9.9 09/29/2013 0940   GFRNONAA >90 09/29/2013 0940   GFRAA >90 09/29/2013 0940     ASSESSMENT AND PLAN  [name] remains moderately obese, but his other coronary risk factors are mostly well addressed. I am especially pleased with the fact that he has now managed to stay off cigarettes for almost a year. The major unaddressed problem is his low HDL cholesterol, for which we do not have a good pharmacological solution. He will have to focus on losing weight and increasing physical activity. We discussed a diet with reduced intake of carbohydrates (especially those of high glycemic index and (as the best way for him to lose weight. No changes are made to his chronic medications. Both his blood pressure and his LDL cholesterol are well treated.  Orders Placed This Encounter  Procedures  . EKG 12-Lead   No orders of the defined types were placed in this encounter.    Holli Humbles, MD, [place] [phone] office [phone] pager

## 2013-11-21 ENCOUNTER — Other Ambulatory Visit: Payer: Self-pay | Admitting: Urology

## 2013-12-05 ENCOUNTER — Other Ambulatory Visit: Payer: Self-pay

## 2013-12-06 MED ORDER — PANTOPRAZOLE SODIUM 40 MG PO TBEC: 40.0000 mg | DELAYED_RELEASE_TABLET | Freq: Every day | ORAL | Status: DC

## 2013-12-20 ENCOUNTER — Other Ambulatory Visit: Payer: Self-pay | Admitting: Urology

## 2014-01-19 ENCOUNTER — Encounter (HOSPITAL_COMMUNITY): Payer: Self-pay | Admitting: Pharmacy Technician

## 2014-01-23 NOTE — Patient Instructions (Signed)
RAFEAL SKIBICKI  01/23/2014   Your procedure is scheduled on:  01/31/2014  0730am-0930am  Report to [facility].  Follow the Signs to [place] at   0530     am  Call this number if you have problems the morning of surgery: [phone]   Remember:   Do not eat food or drink liquids after midnight.   Take these medicines the morning of surgery with A SIP OF WATER:    Do not wear jewelry, .  Do not wear lotions, powders, or perfumes.    Men may shave face and neck.  Do not bring valuables to the hospital.  Contacts, dentures or bridgework may not be worn into surgery.  Leave suitcase in the car. After surgery it may be brought to your room.  For patients admitted to the hospital, checkout time is 11:00 AM the day of  discharge.          Please read over the following fact sheets that you were given: [facility] - Preparing for Surgery Before surgery, you can play an important role.  Because skin is not sterile, your skin needs to be as free of germs as possible.  You can reduce the number of germs on your skin by washing with CHG (chlorahexidine gluconate) soap before surgery.  CHG is an antiseptic cleaner which kills germs and bonds with the skin to continue killing germs even after washing. Please DO NOT use if you have an allergy to CHG or antibacterial soaps.  If your skin becomes reddened/irritated stop using the CHG and inform your nurse when you arrive at Short Stay. Do not shave (including legs and underarms) for at least 48 hours prior to the first CHG shower.  You may shave your face/neck. Please follow these instructions carefully:  1.  Shower with CHG Soap the night before surgery and the  morning of Surgery.  2.  If you choose to wash your hair, wash your hair first as usual with your  normal  shampoo.  3.  After you shampoo, rinse your hair and body thoroughly to remove the  shampoo.                           4.  Use CHG as you would any other liquid soap.   You can apply chg directly  to the skin and wash                       Gently with a scrungie or clean washcloth.  5.  Apply the CHG Soap to your body ONLY FROM THE NECK DOWN.   Do not use on face/ open                           Wound or open sores. Avoid contact with eyes, ears mouth and genitals (private parts).                       Wash face,  Genitals (private parts) with your normal soap.             6.  Wash thoroughly, paying special attention to the area where your surgery  will be performed.  7.  Thoroughly rinse your body with warm water from the neck down.  8.  DO NOT shower/wash with your normal soap after using and rinsing off  the CHG Soap.                9.  Pat yourself dry with a clean towel.            10.  Wear clean pajamas.            11.  Place clean sheets on your bed the night of your first shower and do not  sleep with pets. Day of Surgery : Do not apply any lotions/deodorants the morning of surgery.  Please wear clean clothes to the hospital/surgery center.  FAILURE TO FOLLOW THESE INSTRUCTIONS MAY RESULT IN THE CANCELLATION OF YOUR SURGERY PATIENT SIGNATURE_________________________________  NURSE SIGNATURE__________________________________  ________________________________________________________________________  WHAT IS A BLOOD TRANSFUSION? Blood Transfusion Information  A transfusion is the replacement of blood or some of its parts. Blood is made up of multiple cells which provide different functions.  Red blood cells carry oxygen and are used for blood loss replacement.  White blood cells fight against infection.  Platelets control bleeding.  Plasma helps clot blood.  Other blood products are available for specialized needs, such as hemophilia or other clotting disorders. BEFORE THE TRANSFUSION  Who gives blood for transfusions?   Healthy volunteers who are fully evaluated to make sure their blood is safe. This is blood bank blood. Transfusion  therapy is the safest it has ever been in the practice of medicine. Before blood is taken from a donor, a complete history is taken to make sure that person has no history of diseases nor engages in risky social behavior (examples are intravenous drug use or sexual activity with multiple partners). The donor's travel history is screened to minimize risk of transmitting infections, such as malaria. The donated blood is tested for signs of infectious diseases, such as HIV and hepatitis. The blood is then tested to be sure it is compatible with you in order to minimize the chance of a transfusion reaction. If you or a relative donates blood, this is often done in anticipation of surgery and is not appropriate for emergency situations. It takes many days to process the donated blood. RISKS AND COMPLICATIONS Although transfusion therapy is very safe and saves many lives, the main dangers of transfusion include:   Getting an infectious disease.  Developing a transfusion reaction. This is an allergic reaction to something in the blood you were given. Every precaution is taken to prevent this. The decision to have a blood transfusion has been considered carefully by your caregiver before blood is given. Blood is not given unless the benefits outweigh the risks. AFTER THE TRANSFUSION  Right after receiving a blood transfusion, you will usually feel much better and more energetic. This is especially true if your red blood cells have gotten low (anemic). The transfusion raises the level of the red blood cells which carry oxygen, and this usually causes an energy increase.  The nurse administering the transfusion will monitor you carefully for complications. HOME CARE INSTRUCTIONS  No special instructions are needed after a transfusion. You may find your energy is better. Speak with your caregiver about any limitations on activity for underlying diseases you may have. SEEK MEDICAL CARE IF:   Your condition is  not improving after your transfusion.  You develop redness or irritation at the intravenous (IV) site. SEEK IMMEDIATE MEDICAL CARE IF:  Any of the following symptoms occur over the next 12 hours:  Shaking chills.  You have a temperature by mouth above 102 F (38.9 C), not controlled by  medicine.  Chest, back, or muscle pain.  People around you feel you are not acting correctly or are confused.  Shortness of breath or difficulty breathing.  Dizziness and fainting.  You get a rash or develop hives.  You have a decrease in urine output.  Your urine turns a dark color or changes to pink, red, or brown. Any of the following symptoms occur over the next 10 days:  You have a temperature by mouth above 102 F (38.9 C), not controlled by medicine.  Shortness of breath.  Weakness after normal activity.  The white part of the eye turns yellow (jaundice).  You have a decrease in the amount of urine or are urinating less often.  Your urine turns a dark color or changes to pink, red, or brown. Document Released: 08/08/2000 Document Revised: 11/03/2011 Document Reviewed: 03/27/2008 ExitCare Patient Information 2014 [place].  _______________________________________________________________________ coughing and deep breathing exercises, leg exercises

## 2014-01-24 ENCOUNTER — Encounter (HOSPITAL_COMMUNITY)
Admission: RE | Admit: 2014-01-24 | Discharge: 2014-01-24 | Disposition: A | Discharge status: Home / Self Care | Payer: Medicare Other | Source: Ambulatory Visit | Attending: Urology | Admitting: Urology

## 2014-01-24 ENCOUNTER — Encounter (HOSPITAL_COMMUNITY): Payer: Self-pay

## 2014-01-24 ENCOUNTER — Ambulatory Visit (HOSPITAL_COMMUNITY)
Admission: RE | Admit: 2014-01-24 | Discharge: 2014-01-24 | Disposition: A | Discharge status: Home / Self Care | Payer: Medicare Other | Source: Ambulatory Visit | Attending: Anesthesiology | Admitting: Anesthesiology

## 2014-01-24 DIAGNOSIS — Z01818 Encounter for other preprocedural examination: Secondary | ICD-10-CM | POA: Insufficient documentation

## 2014-01-24 DIAGNOSIS — Z01812 Encounter for preprocedural laboratory examination: Secondary | ICD-10-CM | POA: Insufficient documentation

## 2014-01-24 LAB — BASIC METABOLIC PANEL
BUN: 12 mg/dL (ref 6–23)
CHLORIDE: 103 meq/L (ref 96–112)
CO2: 24 meq/L (ref 19–32)
Calcium: 9.8 mg/dL (ref 8.4–10.5)
Creatinine, Ser: 0.9 mg/dL (ref 0.50–1.35)
GFR calc Af Amer: >90 mL/min (ref >90)
GFR calc non Af Amer: >90 mL/min (ref >90)
GLUCOSE: 133 mg/dL — AB (ref 70–99)
POTASSIUM: 4.3 meq/L (ref 3.7–5.3)
Sodium: 140 mEq/L (ref 137–147)

## 2014-01-24 LAB — APTT: aPTT: 30 seconds (ref 24–37)

## 2014-01-24 LAB — CBC
HCT: 36.9 % — ABNORMAL LOW (ref 39.0–52.0)
Hemoglobin: 12.2 g/dL — ABNORMAL LOW (ref 13.0–17.0)
MCH: 31.5 pg (ref 26.0–34.0)
MCHC: 33.1 g/dL (ref 30.0–36.0)
MCV: 95.3 fL (ref 78.0–100.0)
PLATELETS: 340 10*3/uL (ref 150–400)
RBC: 3.87 MIL/uL — ABNORMAL LOW (ref 4.22–5.81)
RDW: 12.7 % (ref 11.5–15.5)
WBC: 6.9 10*3/uL (ref 4.0–10.5)

## 2014-01-24 LAB — PROTIME-INR
INR: 0.97 (ref 0.00–1.49)
Prothrombin Time: 12.7 seconds (ref 11.6–15.2)

## 2014-01-24 NOTE — Progress Notes (Signed)
Last office visit with Dr Loleta Chance- 10/05/13 EP[facility]  EKG 10/05/13 in [place]

## 2014-01-26 NOTE — H&P (Signed)
History of Present Illness   [name] has stable incomplete bladder emptying. He has stable prostate cancer. He is here to be re-cystoscoped before artificial sphincter in June. He is prone to MRSA infections with risk factors as noted.   Clinically he is not infected today.  Review of Systems: No change in bowel or neurologic systems.   After verbal consent given, [name] underwent flexible cystoscopy. Penile, bulbar urethra were normal. At the bladder neck, I felt a little bit of rigidity. I could see calcification reforming between 10 and 2 o'clock anteriorly. He had about a 1-2 mm stone at 7 o'clock at the bladder neck, just to the right of the midline, but I did not see any sling erosion. Trigone was normal. There is no cystitis, there is no foreign body in the bladder. Clinically he did not have an infection today.    Past Medical History Problems  1. History of heartburn (V12.79) 2. History of hypercholesterolemia (V12.29) 3. History of sleep apnea (V13.89) 4. Prostate cancer (185)  Surgical History Problems  1. History of Cystoscopy With Biopsy 2. History of Cystoscopy With Fragmentation Of Bladder Calculus 3. History of Cystoscopy With Removal Of Object 4. History of Hand Repair 5. History of Prostatectomy Robotic-Assisted  Current Meds 1. Abilify 5 MG Oral Tablet;  Therapy: (Recorded:16Oct2012) to Recorded 2. Desipramine HCl - 100 MG Oral Tablet;  Therapy: [id number] to Recorded 3. FLUoxetine HCl - 60 MG Oral Tablet;  Therapy: [id number] to Recorded 4. Megestrol Acetate 20 MG Oral Tablet; Take 1 tablet twice daily;  Therapy: 17Apr2015 to (Last Rx:17Apr2015)  Requested for: 17Apr2015 Ordered 5. Multivitamins TABS;  Therapy: (Recorded:25Feb2008) to Recorded 6. Nuvigil 150 MG Oral Tablet;  Therapy: [id number] to Recorded 7. Pantoprazole Sodium 40 MG Oral Tablet Delayed Release;  Therapy: 03Oct2014 to Recorded 8. Simvastatin 40 MG Oral Tablet;  Therapy: 22Jul2010 to  Recorded 9. Trilipix 135 MG Oral Capsule Delayed Release;  Therapy: 20Aug2010 to Recorded 10. Vasotec TABS;   Therapy: (Recorded:03Feb2015) to Recorded  Allergies Medication  1. Penicillins  Family History Problems  1. Family history of Diabetes Mellitus (V18.0) : Mother 2. Family history of Diabetes Mellitus (V18.0) : Father 3. Family history of Heart Disease (V17.49) : Mother  Social History Problems  1. Current every day smoker (305.1) 2. Marital History - Currently Married 3. Occupation:   truck Geophysicist/field seismologist 4. Tobacco Use (V15.82)   has smoked 1 1/2 packs per day for 39 years  Vitals Vital Signs [Data Includes: Last 1 Day]  Recorded: 24Apr2015 10:51AM  Blood Pressure: 143 / 83 Temperature: 98.5 F Heart Rate: 78  Assessment Assessed  1. Increased urinary frequency (788.41) 2. Male stress incontinence (49.32)  Plan Male stress incontinence  1. Follow-up Schedule Surgery Office  Follow-up  Status: Complete  Done: 24Apr2015 2. Cysto; Status:Complete;   Done: [id number]  Discussion/Summary   I am concerned that [name] has reformation of bladder neck stones, likely from an anteriorly located foreign body or calcification that is reforming on unhealthy tissue.   Because of his total incontinence, I must say the reformation of the stones is quite rapid, recognizing his procedure by Michael [name] was February 2015. Schedule for his sphincter is June 9.   I consulted with Michael [name]. I talked to [name] and his wife. I drew him a picture. We all agreed with the following plan. I think he is forming stones on dystrophic calcification. I will scrape off the stones at the time of his sphincter.  He is likely going to form them even faster because he will have stasis of urine as opposed to total incontinence. Michael [name] agreed that if he becomes symptomatic with a proximal stone, that from time to time, recognizing risks, that he would perform transurethral removal of the stones  using a laser and likely a ureteroscope. [name] want to proceed and I think it is a good decision under the difficult circumstances.   I put in a call to Infectious Disease.   I spoke to [name] in Infectious Disease. We will use Bactroban ointment, approximately half and inch on one's finger applied twice a day to the nasal area for 1 week prior to surgery. He will use Chlorhexidine or Hibiclens applied to wet skin for approximately 1-2 minutes and then rinse night and daily for 1 week preoperative. I will give him a Vancomycin loading dose.   After a thorough review of the management options for the patient's condition the patient  elected to proceed with surgical therapy as noted above. We have discussed the potential benefits and risks of the procedure, side effects of the proposed treatment, the likelihood of the patient achieving the goals of the procedure, and any potential problems that might occur during the procedure or recuperation. Informed consent has been obtained.

## 2014-01-31 ENCOUNTER — Ambulatory Visit (HOSPITAL_COMMUNITY): Payer: Medicare Other | Admitting: Anesthesiology

## 2014-01-31 ENCOUNTER — Observation Stay (HOSPITAL_COMMUNITY)
Admission: RE | Admit: 2014-01-31 | Discharge: 2014-02-01 | Disposition: A | Discharge status: Home / Self Care | Payer: Medicare Other | Source: Ambulatory Visit | Attending: Urology | Admitting: Urology

## 2014-01-31 ENCOUNTER — Encounter (HOSPITAL_COMMUNITY): Payer: Medicare Other | Admitting: Anesthesiology

## 2014-01-31 ENCOUNTER — Encounter (HOSPITAL_COMMUNITY)
Admission: RE | Disposition: A | Discharge status: Home / Self Care | Payer: Self-pay | Source: Ambulatory Visit | Attending: Urology

## 2014-01-31 ENCOUNTER — Encounter (HOSPITAL_COMMUNITY): Payer: Self-pay | Admitting: *Deleted

## 2014-01-31 DIAGNOSIS — E7800 Pure hypercholesterolemia, unspecified: Secondary | ICD-10-CM | POA: Insufficient documentation

## 2014-01-31 DIAGNOSIS — F17200 Nicotine dependence, unspecified, uncomplicated: Secondary | ICD-10-CM | POA: Insufficient documentation

## 2014-01-31 DIAGNOSIS — I10 Essential (primary) hypertension: Secondary | ICD-10-CM | POA: Insufficient documentation

## 2014-01-31 DIAGNOSIS — Z8614 Personal history of Methicillin resistant Staphylococcus aureus infection: Secondary | ICD-10-CM | POA: Insufficient documentation

## 2014-01-31 DIAGNOSIS — Z981 Arthrodesis status: Secondary | ICD-10-CM | POA: Insufficient documentation

## 2014-01-31 DIAGNOSIS — N3289 Other specified disorders of bladder: Secondary | ICD-10-CM | POA: Insufficient documentation

## 2014-01-31 DIAGNOSIS — J4489 Other specified chronic obstructive pulmonary disease: Secondary | ICD-10-CM | POA: Insufficient documentation

## 2014-01-31 DIAGNOSIS — N210 Calculus in bladder: Secondary | ICD-10-CM | POA: Insufficient documentation

## 2014-01-31 DIAGNOSIS — R32 Unspecified urinary incontinence: Principal | ICD-10-CM | POA: Insufficient documentation

## 2014-01-31 DIAGNOSIS — G4730 Sleep apnea, unspecified: Secondary | ICD-10-CM | POA: Insufficient documentation

## 2014-01-31 DIAGNOSIS — J449 Chronic obstructive pulmonary disease, unspecified: Secondary | ICD-10-CM | POA: Insufficient documentation

## 2014-01-31 DIAGNOSIS — R350 Frequency of micturition: Secondary | ICD-10-CM | POA: Insufficient documentation

## 2014-01-31 DIAGNOSIS — R1319 Other dysphagia: Secondary | ICD-10-CM | POA: Insufficient documentation

## 2014-01-31 DIAGNOSIS — Z79899 Other long term (current) drug therapy: Secondary | ICD-10-CM | POA: Insufficient documentation

## 2014-01-31 DIAGNOSIS — C61 Malignant neoplasm of prostate: Secondary | ICD-10-CM | POA: Insufficient documentation

## 2014-01-31 HISTORY — PX: URINARY SPHINCTER IMPLANT: SHX2624

## 2014-01-31 LAB — TYPE AND SCREEN
ABO/RH(D): A POS
Antibody Screen: NEGATIVE

## 2014-01-31 LAB — HEMOGLOBIN AND HEMATOCRIT, BLOOD
HCT: 33.6 % — ABNORMAL LOW (ref 39.0–52.0)
HEMOGLOBIN: 11.2 g/dL — AB (ref 13.0–17.0)

## 2014-01-31 SURGERY — INSERTION, ARTIFICIAL URINARY SPHINCTER
Anesthesia: General

## 2014-01-31 MED ORDER — ACETAMINOPHEN 325 MG PO TABS: 650.0000 mg | ORAL_TABLET | ORAL | Status: DC | PRN

## 2014-01-31 MED ORDER — FENTANYL CITRATE 0.05 MG/ML IJ SOLN
INTRAMUSCULAR | Status: AC
  Filled 2014-01-31: qty 5

## 2014-01-31 MED ORDER — LACTATED RINGERS IV SOLN: INTRAVENOUS | Status: DC

## 2014-01-31 MED ORDER — GLYCOPYRROLATE 0.2 MG/ML IJ SOLN
INTRAMUSCULAR | Status: AC
  Filled 2014-01-31: qty 4

## 2014-01-31 MED ORDER — DEXAMETHASONE SODIUM PHOSPHATE 10 MG/ML IJ SOLN
INTRAMUSCULAR | Status: AC
  Filled 2014-01-31: qty 1

## 2014-01-31 MED ORDER — LACTATED RINGERS IV SOLN
INTRAVENOUS | Status: DC | PRN
  Administered 2014-01-31 (×2): via INTRAVENOUS

## 2014-01-31 MED ORDER — SULFAMETHOXAZOLE-TMP DS 800-160 MG PO TABS: 1.0000 | ORAL_TABLET | Freq: Two times a day (BID) | ORAL | Status: DC

## 2014-01-31 MED ORDER — MORPHINE SULFATE 2 MG/ML IJ SOLN
2.0000 mg | INTRAMUSCULAR | Status: DC | PRN
  Administered 2014-01-31: 2 mg via INTRAVENOUS
  Administered 2014-01-31: 4 mg via INTRAVENOUS
  Administered 2014-01-31: 3 mg via INTRAVENOUS
  Administered 2014-01-31 – 2014-02-01 (×2): 2 mg via INTRAVENOUS
  Administered 2014-02-01: 4 mg via INTRAVENOUS
  Filled 2014-01-31 (×4): qty 2
  Filled 2014-01-31: qty 1

## 2014-01-31 MED ORDER — LIDOCAINE HCL 2 % EX GEL
CUTANEOUS | Status: AC
  Filled 2014-01-31: qty 10

## 2014-01-31 MED ORDER — SODIUM CHLORIDE 0.9 % IR SOLN
Status: DC | PRN
  Administered 2014-01-31: 300 mL via INTRAVESICAL

## 2014-01-31 MED ORDER — ONDANSETRON HCL 4 MG/2ML IJ SOLN
INTRAMUSCULAR | Status: AC
  Filled 2014-01-31: qty 2

## 2014-01-31 MED ORDER — METOCLOPRAMIDE HCL 5 MG/ML IJ SOLN
INTRAMUSCULAR | Status: DC | PRN
  Administered 2014-01-31: 10 mg via INTRAVENOUS

## 2014-01-31 MED ORDER — PANTOPRAZOLE SODIUM 40 MG PO TBEC
40.0000 mg | DELAYED_RELEASE_TABLET | Freq: Every day | ORAL | Status: DC
  Administered 2014-02-01: 40 mg via ORAL
  Filled 2014-01-31: qty 1

## 2014-01-31 MED ORDER — CIPROFLOXACIN IN D5W 400 MG/200ML IV SOLN
400.0000 mg | INTRAVENOUS | Status: AC
  Administered 2014-01-31: 400 mg via INTRAVENOUS

## 2014-01-31 MED ORDER — SIMVASTATIN 40 MG PO TABS
40.0000 mg | ORAL_TABLET | Freq: Every evening | ORAL | Status: DC
  Administered 2014-01-31: 40 mg via ORAL
  Filled 2014-01-31 (×2): qty 1

## 2014-01-31 MED ORDER — MORPHINE SULFATE 10 MG/ML IJ SOLN
2.0000 mg | INTRAMUSCULAR | Status: DC | PRN
  Filled 2014-01-31: qty 1

## 2014-01-31 MED ORDER — SUCCINYLCHOLINE CHLORIDE 20 MG/ML IJ SOLN
INTRAMUSCULAR | Status: DC | PRN
  Administered 2014-01-31: 100 mg via INTRAVENOUS

## 2014-01-31 MED ORDER — HYDROMORPHONE HCL PF 1 MG/ML IJ SOLN
INTRAMUSCULAR | Status: AC
  Filled 2014-01-31: qty 1

## 2014-01-31 MED ORDER — MIDAZOLAM HCL 2 MG/2ML IJ SOLN
INTRAMUSCULAR | Status: AC
  Filled 2014-01-31: qty 2

## 2014-01-31 MED ORDER — DEXAMETHASONE SODIUM PHOSPHATE 10 MG/ML IJ SOLN
INTRAMUSCULAR | Status: DC | PRN
  Administered 2014-01-31: 10 mg via INTRAVENOUS

## 2014-01-31 MED ORDER — LIDOCAINE-EPINEPHRINE 1 %-1:100000 IJ SOLN
INTRAMUSCULAR | Status: AC
  Filled 2014-01-31: qty 2

## 2014-01-31 MED ORDER — HYDROCODONE-ACETAMINOPHEN 5-325 MG PO TABS: 1.0000 | ORAL_TABLET | Freq: Four times a day (QID) | ORAL | Status: DC | PRN

## 2014-01-31 MED ORDER — CIPROFLOXACIN IN D5W 400 MG/200ML IV SOLN
INTRAVENOUS | Status: AC
  Filled 2014-01-31: qty 200

## 2014-01-31 MED ORDER — FLUOXETINE HCL 20 MG PO CAPS
80.0000 mg | ORAL_CAPSULE | Freq: Every morning | ORAL | Status: DC
  Administered 2014-02-01: 80 mg via ORAL
  Filled 2014-01-31: qty 4

## 2014-01-31 MED ORDER — FENTANYL CITRATE 0.05 MG/ML IJ SOLN
INTRAMUSCULAR | Status: DC | PRN
Start: 2014-01-31 — End: 2014-01-31
  Administered 2014-01-31 (×2): 50 ug via INTRAVENOUS
  Administered 2014-01-31: 100 ug via INTRAVENOUS

## 2014-01-31 MED ORDER — MIDAZOLAM HCL 5 MG/5ML IJ SOLN
INTRAMUSCULAR | Status: DC | PRN
  Administered 2014-01-31: 2 mg via INTRAVENOUS

## 2014-01-31 MED ORDER — LIDOCAINE-EPINEPHRINE 1 %-1:100000 IJ SOLN
INTRAMUSCULAR | Status: DC | PRN
  Administered 2014-01-31: 10 mL

## 2014-01-31 MED ORDER — CISATRACURIUM BESYLATE (PF) 10 MG/5ML IV SOLN
INTRAVENOUS | Status: DC | PRN
  Administered 2014-01-31: 2 mg via INTRAVENOUS
  Administered 2014-01-31: 6 mg via INTRAVENOUS

## 2014-01-31 MED ORDER — ONDANSETRON HCL 4 MG/2ML IJ SOLN
INTRAMUSCULAR | Status: DC | PRN
  Administered 2014-01-31: 4 mg via INTRAVENOUS

## 2014-01-31 MED ORDER — BIOTENE DRY MOUTH MT LIQD
15.0000 mL | Freq: Two times a day (BID) | OROMUCOSAL | Status: DC
  Administered 2014-01-31 – 2014-02-01 (×3): 15 mL via OROMUCOSAL

## 2014-01-31 MED ORDER — NEOSTIGMINE METHYLSULFATE 10 MG/10ML IV SOLN
INTRAVENOUS | Status: AC
  Filled 2014-01-31: qty 1

## 2014-01-31 MED ORDER — PROPOFOL 10 MG/ML IV BOLUS
INTRAVENOUS | Status: AC
  Filled 2014-01-31: qty 20

## 2014-01-31 MED ORDER — POLYMYXIN B SULFATE 500000 UNITS IJ SOLR
INTRAMUSCULAR | Status: DC | PRN
  Administered 2014-01-31: 10:00:00

## 2014-01-31 MED ORDER — PROPOFOL 10 MG/ML IV BOLUS
INTRAVENOUS | Status: DC | PRN
  Administered 2014-01-31: 200 mg via INTRAVENOUS

## 2014-01-31 MED ORDER — METOCLOPRAMIDE HCL 5 MG/ML IJ SOLN
INTRAMUSCULAR | Status: AC
  Filled 2014-01-31: qty 2

## 2014-01-31 MED ORDER — HYDROCODONE-ACETAMINOPHEN 5-325 MG PO TABS: 1.0000 | ORAL_TABLET | ORAL | Status: DC | PRN

## 2014-01-31 MED ORDER — NEOSTIGMINE METHYLSULFATE 10 MG/10ML IV SOLN
INTRAVENOUS | Status: DC | PRN
  Administered 2014-01-31: 5 mg via INTRAVENOUS

## 2014-01-31 MED ORDER — HYDROMORPHONE HCL PF 1 MG/ML IJ SOLN
0.2500 mg | INTRAMUSCULAR | Status: DC | PRN
Start: 2014-01-31 — End: 2014-01-31
  Administered 2014-01-31 (×2): 0.5 mg via INTRAVENOUS

## 2014-01-31 MED ORDER — VANCOMYCIN HCL 10 G IV SOLR
1500.0000 mg | INTRAVENOUS | Status: AC
  Administered 2014-01-31: 1500 mg via INTRAVENOUS
  Filled 2014-01-31: qty 1500

## 2014-01-31 MED ORDER — DEXTROSE-NACL 5-0.45 % IV SOLN
INTRAVENOUS | Status: DC
  Administered 2014-01-31 – 2014-02-01 (×3): via INTRAVENOUS

## 2014-01-31 MED ORDER — ENALAPRIL MALEATE 10 MG PO TABS
10.0000 mg | ORAL_TABLET | Freq: Every morning | ORAL | Status: DC
  Administered 2014-01-31 – 2014-02-01 (×2): 10 mg via ORAL
  Filled 2014-01-31 (×2): qty 1

## 2014-01-31 MED ORDER — FENOFIBRATE 160 MG PO TABS
160.0000 mg | ORAL_TABLET | Freq: Every day | ORAL | Status: DC
  Administered 2014-01-31 – 2014-02-01 (×2): 160 mg via ORAL
  Filled 2014-01-31 (×2): qty 1

## 2014-01-31 MED ORDER — ONDANSETRON HCL 4 MG/2ML IJ SOLN: 4.0000 mg | INTRAMUSCULAR | Status: DC | PRN

## 2014-01-31 MED ORDER — CISATRACURIUM BESYLATE 20 MG/10ML IV SOLN
INTRAVENOUS | Status: AC
  Filled 2014-01-31: qty 10

## 2014-01-31 MED ORDER — GLYCOPYRROLATE 0.2 MG/ML IJ SOLN
INTRAMUSCULAR | Status: DC | PRN
  Administered 2014-01-31: .8 mg via INTRAVENOUS

## 2014-01-31 SURGICAL SUPPLY — 56 items
ADH SKN CLS APL DERMABOND .7 (GAUZE/BANDAGES/DRESSINGS) ×2
BAG URINE DRAINAGE (UROLOGICAL SUPPLIES) ×3 IMPLANT
BAG URO CATCHER STRL LF (DRAPE) ×1 IMPLANT
BALLOON PRESSURE REGUL 61 70CM (Miscellaneous) IMPLANT
BANDAGE GAUZE ELAST BULKY 4 IN (GAUZE/BANDAGES/DRESSINGS) ×2 IMPLANT
BLADE SURG 15 STRL LF DISP TIS (BLADE) ×2 IMPLANT
BLADE SURG 15 STRL SS (BLADE) ×3
BNDG GAUZE ELAST 4 BULKY (GAUZE/BANDAGES/DRESSINGS) ×3 IMPLANT
BRIEF STRETCH FOR OB PAD LRG (UNDERPADS AND DIAPERS) ×3 IMPLANT
CANISTER SUCTION 2500CC (MISCELLANEOUS) ×1 IMPLANT
CATH FOLEY 2WAY SLVR  5CC 14FR (CATHETERS) ×2
CATH FOLEY 2WAY SLVR 5CC 14FR (CATHETERS) ×1 IMPLANT
CONTROL PUMP (Urological Implant) ×2 IMPLANT
COVER MAYO STAND STRL (DRAPES) ×4 IMPLANT
COVER SURGICAL LIGHT HANDLE (MISCELLANEOUS) ×3 IMPLANT
CUFF URINARY OCCL 4. IZ (Miscellaneous) ×2 IMPLANT
DERMABOND ADVANCED (GAUZE/BANDAGES/DRESSINGS) ×4
DERMABOND ADVANCED .7 DNX12 (GAUZE/BANDAGES/DRESSINGS) ×2 IMPLANT
DISSECTOR ROUND CHERRY 3/8 STR (MISCELLANEOUS) ×3 IMPLANT
DRAPE CAMERA CLOSED 9X96 (DRAPES) ×3 IMPLANT
DRAPE LG THREE QUARTER DISP (DRAPES) ×3 IMPLANT
DRSG TELFA 3X8 NADH (GAUZE/BANDAGES/DRESSINGS) ×3 IMPLANT
ELECT REM PT RETURN 9FT ADLT (ELECTROSURGICAL) ×3
ELECTRODE REM PT RTRN 9FT ADLT (ELECTROSURGICAL) ×1 IMPLANT
GAUZE SPONGE 4X4 16PLY XRAY LF (GAUZE/BANDAGES/DRESSINGS) ×6 IMPLANT
GLOVE BIOGEL M STRL SZ7.5 (GLOVE) ×5 IMPLANT
GOWN STRL REUS W/TWL XL LVL3 (GOWN DISPOSABLE) ×3 IMPLANT
KIT ACCESSORY AMS 800 ×2 IMPLANT
KIT BASIN OR (CUSTOM PROCEDURE TRAY) ×3 IMPLANT
LOOP VESSEL MAXI BLUE (MISCELLANEOUS) ×3 IMPLANT
LUBRICANT JELLY K Y 4OZ (MISCELLANEOUS) ×3 IMPLANT
MANIFOLD NEPTUNE II (INSTRUMENTS) ×3 IMPLANT
NS IRRIG 1000ML POUR BTL (IV SOLUTION) ×3 IMPLANT
PACK BASIC VI WITH GOWN DISP (CUSTOM PROCEDURE TRAY) ×1 IMPLANT
PACK CYSTO (CUSTOM PROCEDURE TRAY) ×3 IMPLANT
PAD DRESSING TELFA 3X8 NADH (GAUZE/BANDAGES/DRESSINGS) ×1 IMPLANT
PENCIL BUTTON HOLSTER BLD 10FT (ELECTRODE) ×3 IMPLANT
PLUG CATH AND CAP STER (CATHETERS) ×3 IMPLANT
PRESS REG BALL 61 70CM (Miscellaneous) ×3 IMPLANT
SHEET LAVH (DRAPES) ×3 IMPLANT
SUT CHROMIC 3 0 SH 27 (SUTURE) IMPLANT
SUT MNCRL AB 4-0 PS2 18 (SUTURE) ×6 IMPLANT
SUT SILK 0 FSL (SUTURE) ×6 IMPLANT
SUT VIC AB 0 CT1 27 (SUTURE) ×9
SUT VIC AB 0 CT1 27XBRD ANTBC (SUTURE) ×1 IMPLANT
SUT VIC AB 0 CT1 36 (SUTURE) ×6 IMPLANT
SUT VIC AB 3-0 SH 27 (SUTURE) ×12
SUT VIC AB 3-0 SH 27X BRD (SUTURE) ×4 IMPLANT
SYR 30ML LL (SYRINGE) ×3 IMPLANT
SYR BULB IRRIGATION 50ML (SYRINGE) ×3 IMPLANT
SYRINGE 10CC LL (SYRINGE) ×6 IMPLANT
TOWEL OR 17X26 10 PK STRL BLUE (TOWEL DISPOSABLE) ×6 IMPLANT
TUBING CONNECTING 10 (TUBING) ×2 IMPLANT
TUBING CONNECTING 10' (TUBING) ×1
WATER STERILE IRR 1500ML POUR (IV SOLUTION) ×3 IMPLANT
YANKAUER SUCT BULB TIP 10FT TU (MISCELLANEOUS) ×3 IMPLANT

## 2014-01-31 NOTE — OR Nursing (Signed)
Medication page completed after the case ended

## 2014-01-31 NOTE — Interval H&P Note (Signed)
History and Physical Interval Note:  01/31/2014 7:14 AM  [name]  has presented today for surgery, with the diagnosis of [place]  The various methods of treatment have been discussed with the patient and family. After consideration of risks, benefits and other options for treatment, the patient has consented to  Procedure(s): CYSTO  IMPLANTATION ARTIFICIAL SPHINCTER AND LASER LITHOTRIPSY OF STONE (N/A) HOLMIUM LASER APPLICATION (N/A) as a surgical intervention .  The patient's history has been reviewed, patient examined, no change in status, stable for surgery.  I have reviewed the patient's chart and labs.  Questions were answered to the patient's satisfaction.     [name] A Kalis Friese

## 2014-01-31 NOTE — Anesthesia Preprocedure Evaluation (Addendum)
Anesthesia Evaluation  Patient identified by MRN, date of birth, ID band Patient awake    Reviewed: Allergy & Precautions, H&P , NPO status , Patient's Chart, lab work & pertinent test results  Airway Mallampati: II TM Distance: >3 FB Neck ROM: full    Dental  (+) Missing, Dental Advisory Given Missing left upper front teeth and right upper side teeth.:   Pulmonary shortness of breath and with exertion, sleep apnea and Continuous Positive Airway Pressure Ventilation , COPDformer smoker,  breath sounds clear to auscultation  Pulmonary exam normal       Cardiovascular Exercise Tolerance: Good hypertension, Pt. on medications Rhythm:regular Rate:Normal     Neuro/Psych Cervical fusion. Back surgery negative neurological ROS  negative psych ROS   GI/Hepatic negative GI ROS, Neg liver ROS, GERD-  Medicated and Controlled,Esophageal dysphagia   Endo/Other  negative endocrine ROS  Renal/GU negative Renal ROS  negative genitourinary   Musculoskeletal   Abdominal   Peds  Hematology negative hematology ROS (+)   Anesthesia Other Findings   Reproductive/Obstetrics negative OB ROS                          Anesthesia Physical Anesthesia Plan  ASA: III  Anesthesia Plan: General   Post-op Pain Management:    Induction: Intravenous  Airway Management Planned: Oral ETT  Additional Equipment:   Intra-op Plan:   Post-operative Plan: Extubation in OR  Informed Consent: I have reviewed the patients History and Physical, chart, labs and discussed the procedure including the risks, benefits and alternatives for the proposed anesthesia with the patient or authorized representative who has indicated his/her understanding and acceptance.   Dental Advisory Given  Plan Discussed with: CRNA and Surgeon  Anesthesia Plan Comments:         Anesthesia Quick Evaluation

## 2014-01-31 NOTE — Op Note (Signed)
Preoperative diagnosis: Stress urinary incontinence and dystrophic calcification the bladder neck Postoperative diagnosis: Stress urinary incontinence and dystrophic calcification bladder neck Surgery: Implantation of artificial urinary sphincter and cystoscopy and removal of bladder stone Surgeon: Dr. Nicki Reaper [place] Tarnowski Assistant: Leta Baptist  The patient has the above diagnoses and consented to the above procedure. Extra care was taken with leg positioning to minimize the risk of compartment syndrome and neuropathy and deep vein thrombosis. All precautions to the minimize risk of MRSA infection were performed  I draped the patient and sewed a towel over the anal area. He had a wide perineal scar from his previous MRSA infection.   I cystoscoped the patient with a 10 [place] and then 21 [place] scope. He a little bit of calcification between 10 and 2:00 dorsally just inside the bladder neck. A small stone at 10:00 was pushed off with my scope and grasping forceps. He has rigidity of tissue at this area. There was no sling in the urethra. I felt that extensive laser of the mild rim of stone would not make a clinical difference in the moderate long term and and could threaten the procedure with bleeding etc. He had relative straightening of the bulbar urethra from his previous sling. There is no question he has pale urethral mucosa throughout most of the urethra  I made a perineal incision a little bit longer than usual. I extended into the scrotum a little bit. I dissected down splitting the bulbospongiosus muscle in the midline. The tissues did not bleed a lot. With sharp dissection I was able to mobilize and  find bulbar urethra. I used my box technique to incise Buck's fascia at the bifurcation of the corporal bodies. I was actually quite pleased how proximal I was. I could pull the urethra off the patient. I delivered a right angle around the bulbar urethra making an opening in the sail of tissue  dorsally at the level the corporal body. Using careful traction I made the opening as proximal as possible and just large enough for the artificial sphincter. It was measured just over 4 cm.  A vesi-loop was applied  With appropriate landmarks I made a 5 or 6 cm right lower quadrant incision in a skin crease. I carried down appropriately to the external oblique on his right side. I incised the fibers of the external oblique aponeurosis. I spread the internal oblique and obturator internus with a large Kelly. I finger dissected to the appropriate plane. I delivered the well-prepared reservoir that was empty and instilled 25 cc of saline.  I closed the aponeurosis of the external oblique with running 0 Vicryl and CT1 needle.  Below the level of the incision he had some splitting or deficiency of his native fascia from initial observation. I did not use this incisions as I thought it was too close to his spermatic cord. I did reapproximate it to fascia more cephalad with interrupted 0 Vicryl so he would not herniate  With appropriate retraction I finger dissected to the right hemiscrotum. I delivered the dependent right hemiscrotum and used a peanut to mobilize tissue. I delivered a well-prepared cuff to the right hemiscrotum.  I then delivered the 4 cm cuff and turned it appropriate to the patient's right. I delivered the cuff tubing up through the right lower quadrant incision. Utilizing quick connects I connected the tubing appropriately. The tubings were all irrigated appropriately. The device was cycled twice and appropriately locked partially full  Both incisions were irrigated with normal  saline  I closed the right lower quadrant with running 3-0 Vicryl subcutaneous and 4-0 Monocryl subcuticular. I closed anatomically the perineum and I was pleased to the reapproximation of the bulbospongiosus muscle. I did a 4 layer closure with 3 layers of 3-0 Vicryl and 4-0 Monocryl for the skin. Appropriate  sterile dressing was applied. Blood loss was less than 50 mL. Leg position was excellent. Urine was clear.  Patient taken to recovery room and hopefully the procedure will reach the patient's treatment goal

## 2014-01-31 NOTE — Progress Notes (Signed)
Patient may go to floor  Now if patient stable ; does not have to stay 2 hours in PACU ; per Dr. Landry Dyke

## 2014-01-31 NOTE — Progress Notes (Signed)
RT placed patient on CPAP with home settings of 14 cmH2O. Water chamber filled with sterile water for humidification. Patient has own mask and tubing. RT will continue to monitor as needed.

## 2014-01-31 NOTE — Progress Notes (Signed)
OR called at [facility] and notified patient to get Vancomycin early pick up.

## 2014-01-31 NOTE — Anesthesia Postprocedure Evaluation (Signed)
  Anesthesia Post-op Note  Patient: [name]  Procedure(s) Performed: Procedure(s) (LRB): IMPLANTATION ARTIFICIAL SPHINCTER  WITH CYSTOSCOPY (N/A)  Patient Location: PACU  Anesthesia Type: General  Level of Consciousness: awake and alert   Airway and Oxygen Therapy: Patient Spontanous Breathing  Post-op Pain: mild  Post-op Assessment: Post-op Vital signs reviewed, Patient's Cardiovascular Status Stable, Respiratory Function Stable, Patent Airway and No signs of Nausea or vomiting  Last Vitals:  Filed Vitals:   01/31/14 1145  BP:   Pulse:   Temp: 36.8 C  Resp:     Post-op Vital Signs: stable   Complications: No apparent anesthesia complications

## 2014-01-31 NOTE — Transfer of Care (Signed)
Immediate Anesthesia Transfer of Care Note  Patient: [name]  Procedure(s) Performed: Procedure(s): IMPLANTATION ARTIFICIAL SPHINCTER  WITH CYSTOSCOPY (N/A)  Patient Location: PACU  Anesthesia Type:General  Level of Consciousness: awake, alert , sedated and patient cooperative  Airway & Oxygen Therapy: Patient Spontanous Breathing and Patient connected to face mask oxygen  Post-op Assessment: Report given to PACU RN and Post -op Vital signs reviewed and stable  Post vital signs: Reviewed and stable  Complications: No apparent anesthesia complications

## 2014-02-01 ENCOUNTER — Encounter (HOSPITAL_COMMUNITY): Payer: Self-pay | Admitting: Urology

## 2014-02-01 LAB — BASIC METABOLIC PANEL
BUN: 8 mg/dL (ref 6–23)
CHLORIDE: 106 meq/L (ref 96–112)
CO2: 24 mEq/L (ref 19–32)
Calcium: 9 mg/dL (ref 8.4–10.5)
Creatinine, Ser: 0.88 mg/dL (ref 0.50–1.35)
GFR calc Af Amer: >90 mL/min (ref >90)
GFR calc non Af Amer: >90 mL/min (ref >90)
Glucose, Bld: 137 mg/dL — ABNORMAL HIGH (ref 70–99)
Potassium: 4.2 mEq/L (ref 3.7–5.3)
SODIUM: 140 meq/L (ref 137–147)

## 2014-02-01 LAB — HEMOGLOBIN AND HEMATOCRIT, BLOOD
HEMATOCRIT: 31.2 % — AB (ref 39.0–52.0)
Hemoglobin: 10.5 g/dL — ABNORMAL LOW (ref 13.0–17.0)

## 2014-02-01 NOTE — Progress Notes (Signed)
Looks good Incisions good Labs normal Vitals normal Home today

## 2014-02-01 NOTE — Discharge Instructions (Signed)
As discussed with Dr. Matilde Sprang. Send home with telfa pad

## 2014-02-01 NOTE — Discharge Summary (Signed)
Date of admission: 01/31/2014  Date of discharge: 02/01/2014  Admission diagnosis: Stress incontinence Discharge diagnosis: Stress incontinence  Secondary diagnoses: bladder neck stone  History and Physical: For full details, please see admission history and physical. Briefly, [name] is a 58 y.o. year old patient with the above diagnosis.   Hospital Course: Underwent implantation of artificial urinary sphincter with uneventful post op course  Laboratory values:  Recent Labs  01/31/14 1609 02/01/14 0327  HGB 11.2* 10.5*  HCT 33.6* 31.2*    Recent Labs  02/01/14 0327  CREATININE 0.88    Disposition: Home  Discharge instruction: The patient was instructed to be ambulatory but told to refrain from heavy lifting, strenuous activity, or driving. Reviewed  Discharge medications:    Medication List         CALCIUM 1000 + D PO  Take 1 tablet by mouth 2 (two) times daily.     Co Q 10 100 MG Caps  Take 1 capsule by mouth daily.     enalapril 10 MG tablet  Commonly known as:  VASOTEC  Take 10 mg by mouth every morning.     FLUoxetine 20 MG capsule  Commonly known as:  PROZAC  Take 80 mg by mouth every morning.     HYDROcodone-acetaminophen 5-325 MG per tablet  Commonly known as:  NORCO  Take 1-2 tablets by mouth every 6 (six) hours as needed.     Krill Oil 300 MG Caps  Take 300 mg by mouth daily.     multivitamin with minerals Tabs tablet  Take 1 tablet by mouth daily.     pantoprazole 40 MG tablet  Commonly known as:  PROTONIX  Take 1 tablet (40 mg total) by mouth daily.     simvastatin 40 MG tablet  Commonly known as:  ZOCOR  Take 40 mg by mouth every evening.     sulfamethoxazole-trimethoprim 800-160 MG per tablet  Commonly known as:  BACTRIM DS  Take 1 tablet by mouth 2 (two) times daily.     TRILIPIX 135 MG capsule  Generic drug:  Choline Fenofibrate  Take 135 mg by mouth daily.        Followup:      Follow-up Information   Follow up  with Keshona Kartes A, MD. (office will call you with date and time of appt)    Specialty:  Urology   Contact information:   [place] [id number] [phone]       Please follow up. (as scheduled)

## 2014-04-26 IMAGING — CR DG LUMBAR SPINE 2-3V
2 series · 2 of 2 positions shown · non-contrast
Comparison: CT scan 09/30/2011.

CLINICAL DATA: Preop for lumbar surgery.

LUMBAR SPINE - 2-3 VIEW

[view not recorded (1 of 2)]
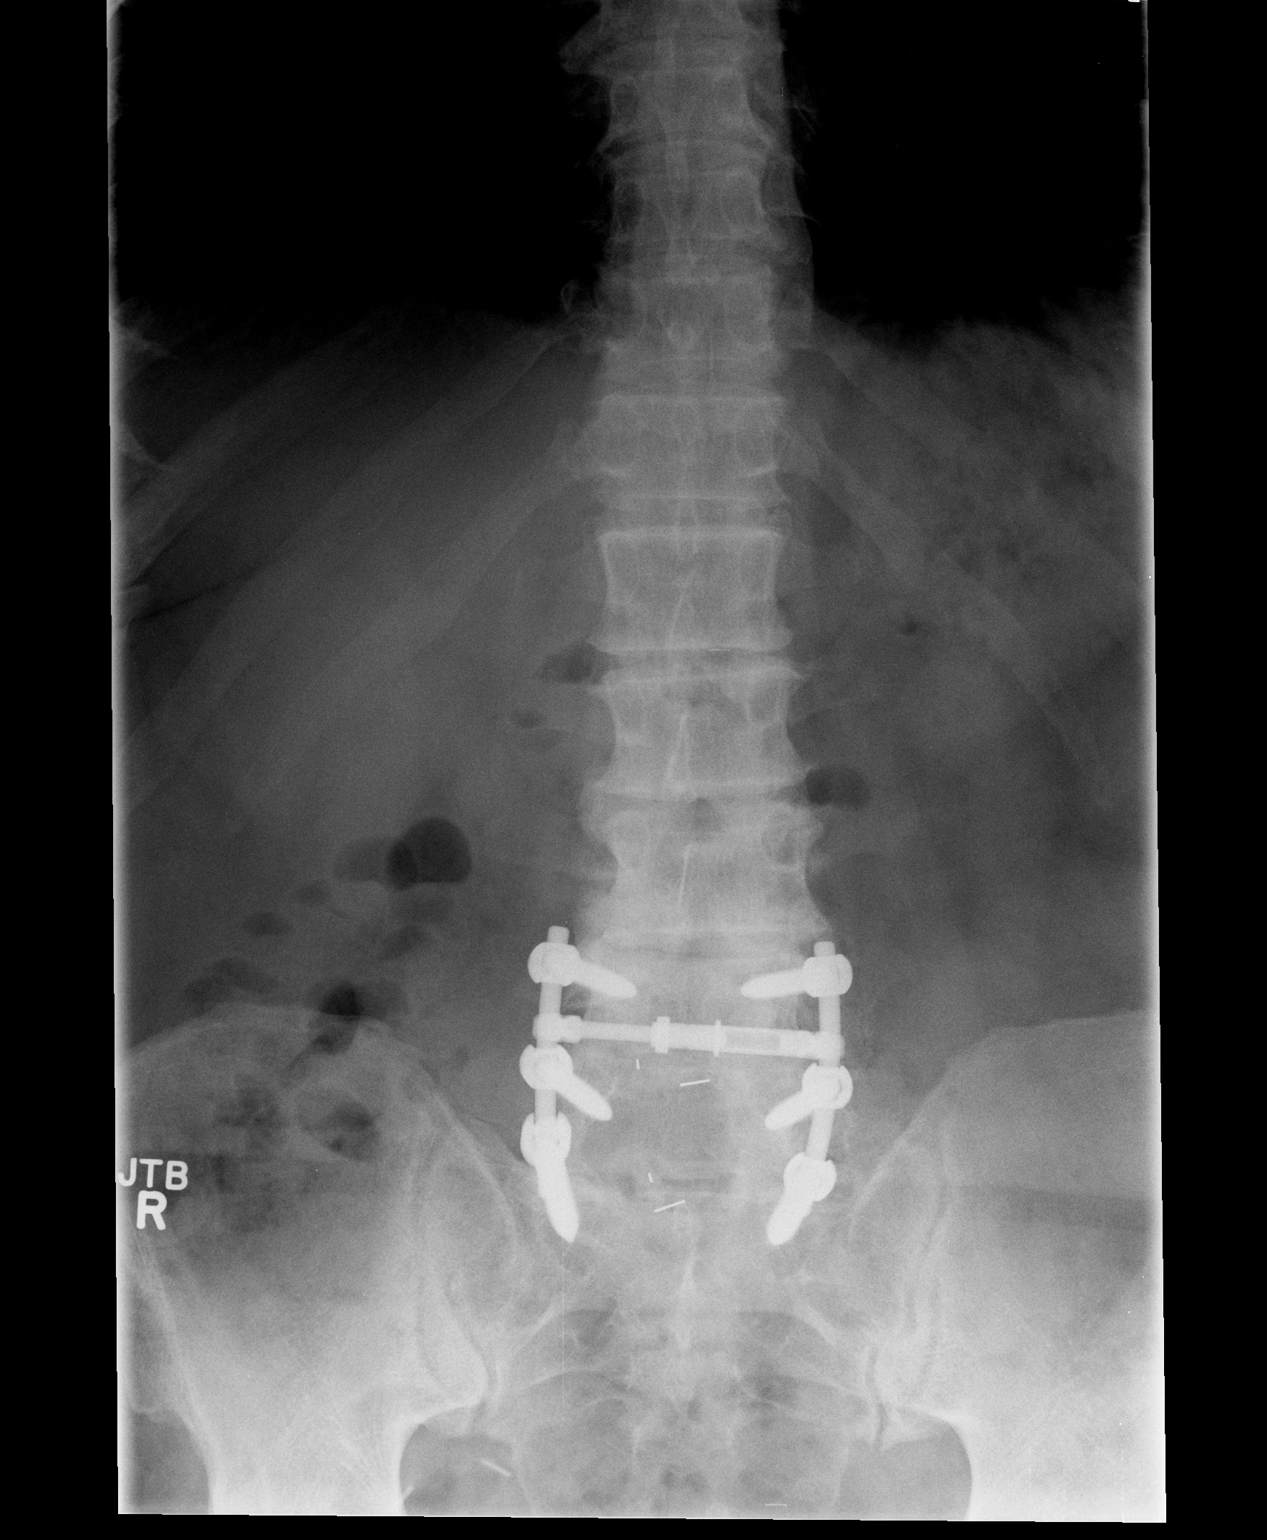

[view not recorded (2 of 2)]
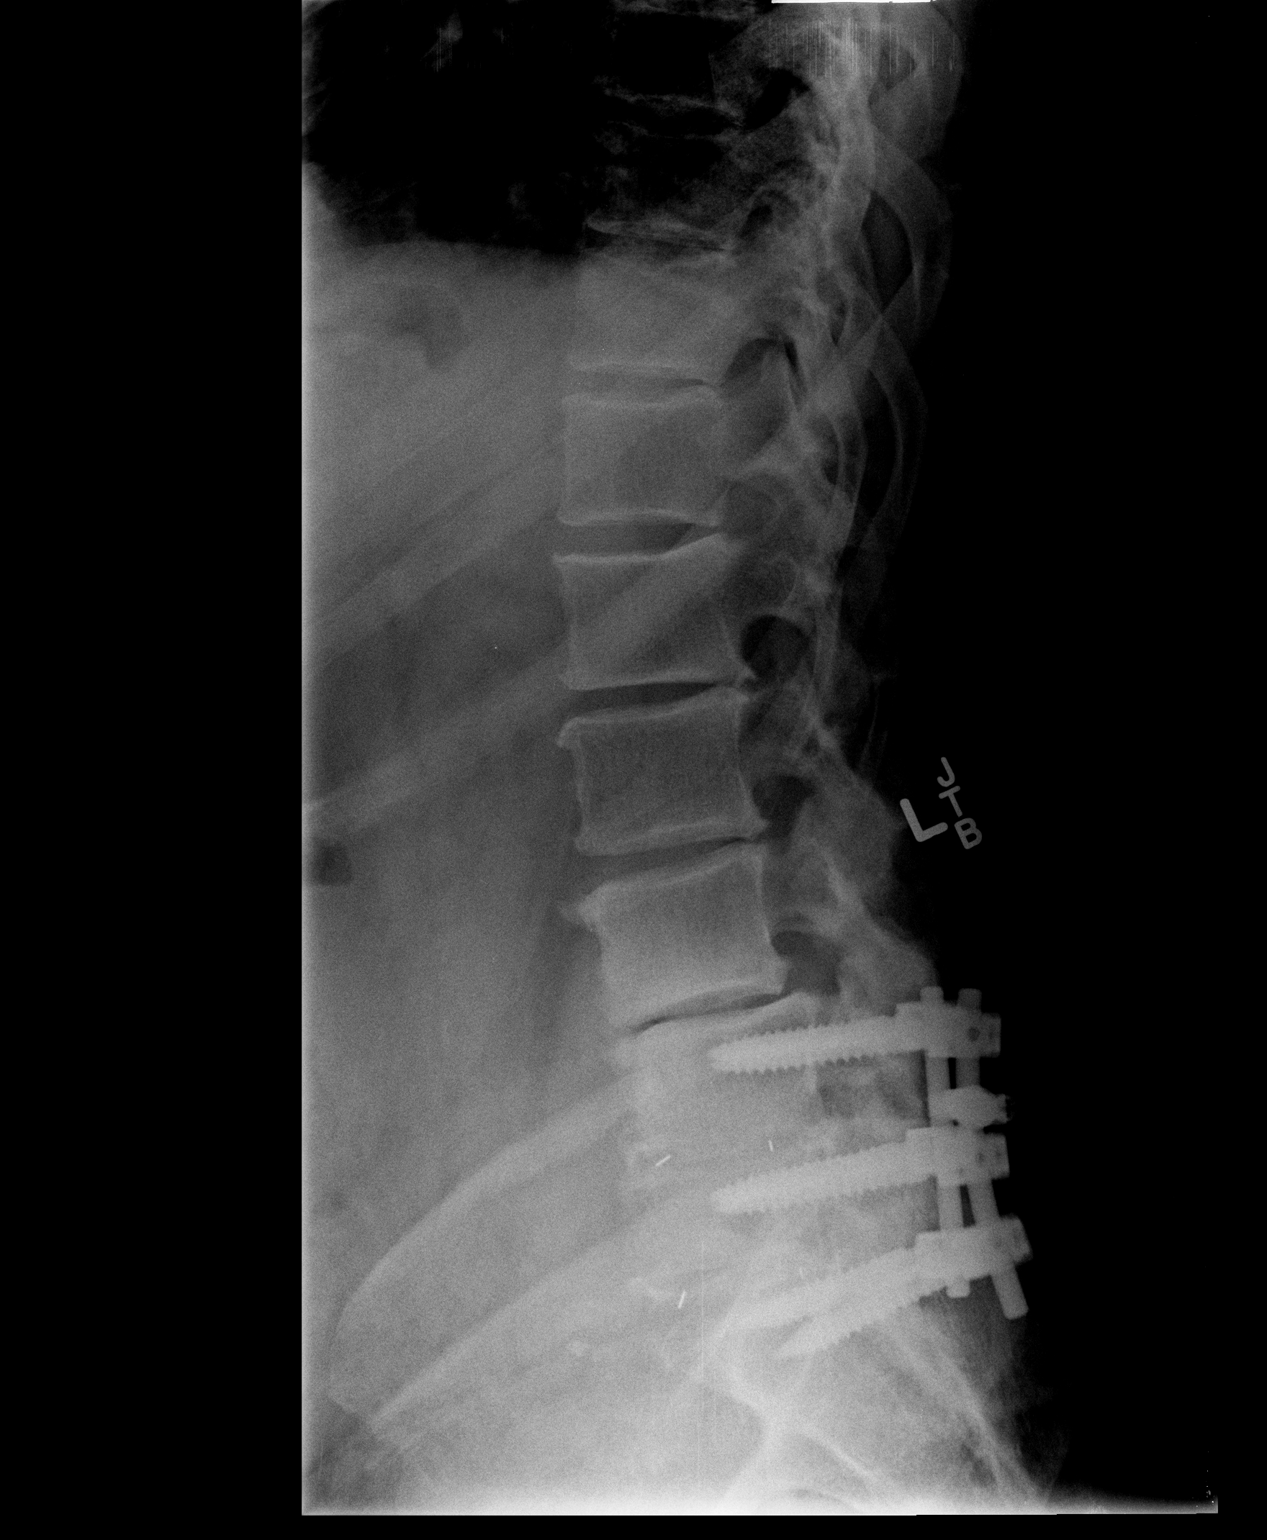

[2 of 2 positions shown; findings below may reference images not displayed]

FINDINGS: There are pedicle screws, posterior rods and interbody
fusion devices at L4-5 and L5-S1.  No complicating features
associated the hardware.  There is advanced degenerative disease at
L3-4 and mild degenerative disease at L1-2 and L2-3.  The SI joints
appear normal.
IMPRESSION: L4-S1 fusion.
Degenerative disease at the remaining levels.

## 2014-04-26 IMAGING — CR DG CHEST 2V
3 series · 3 of 3 positions shown · non-contrast
Comparison: 11/20/2010; 11/17/2009; 12/19/2008; chest CT -
10/04/2010

CLINICAL DATA: Preoperative examination (lumbar surgery), history
of hypertension and smoking

CHEST - 2 VIEW

[view not recorded (1 of 3)]
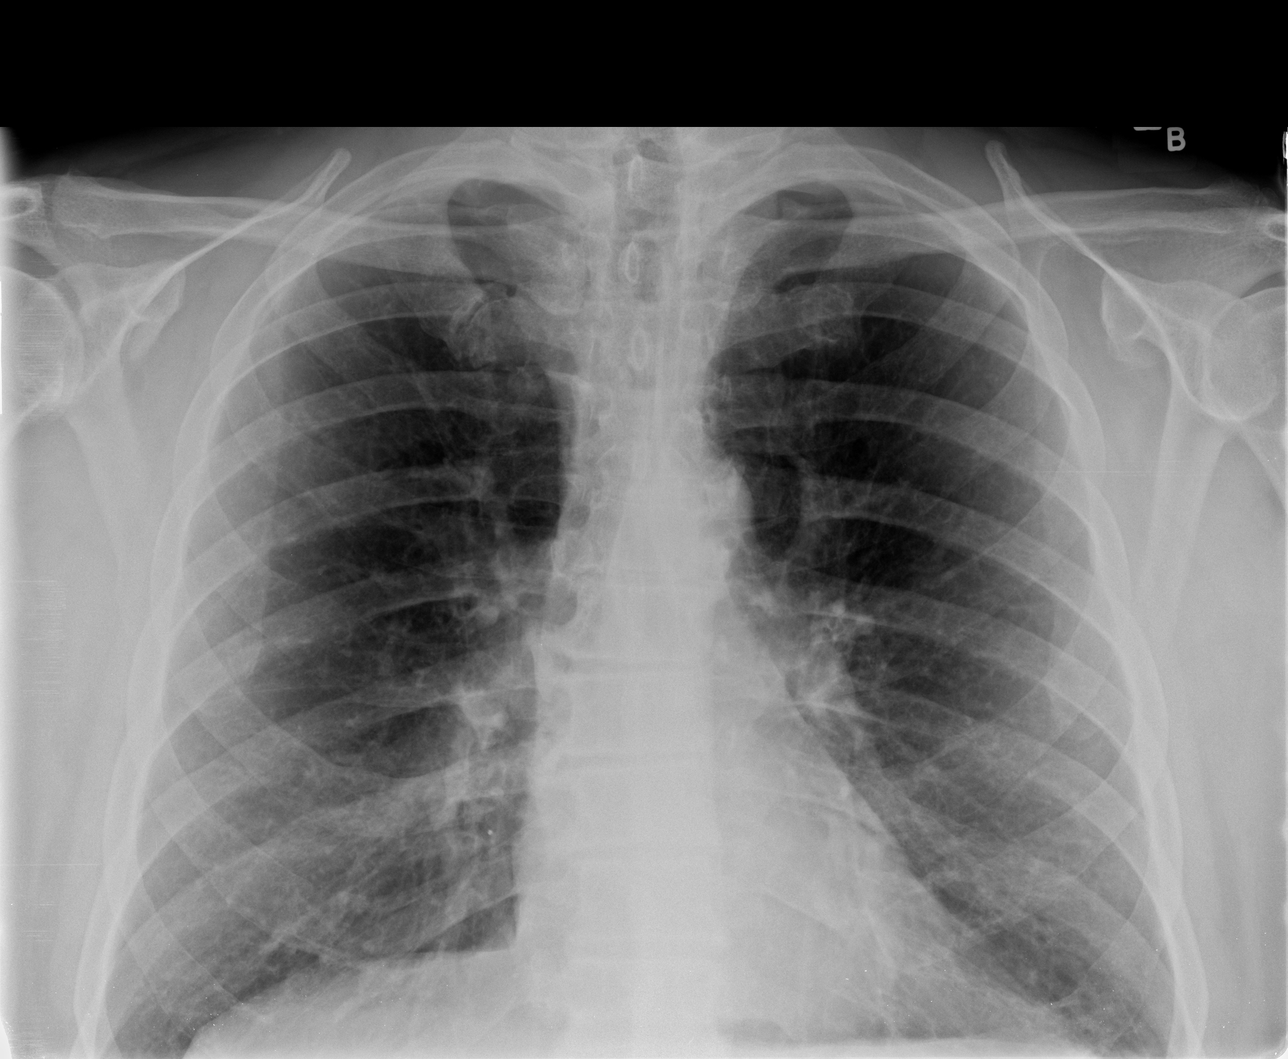

[view not recorded (2 of 3)]
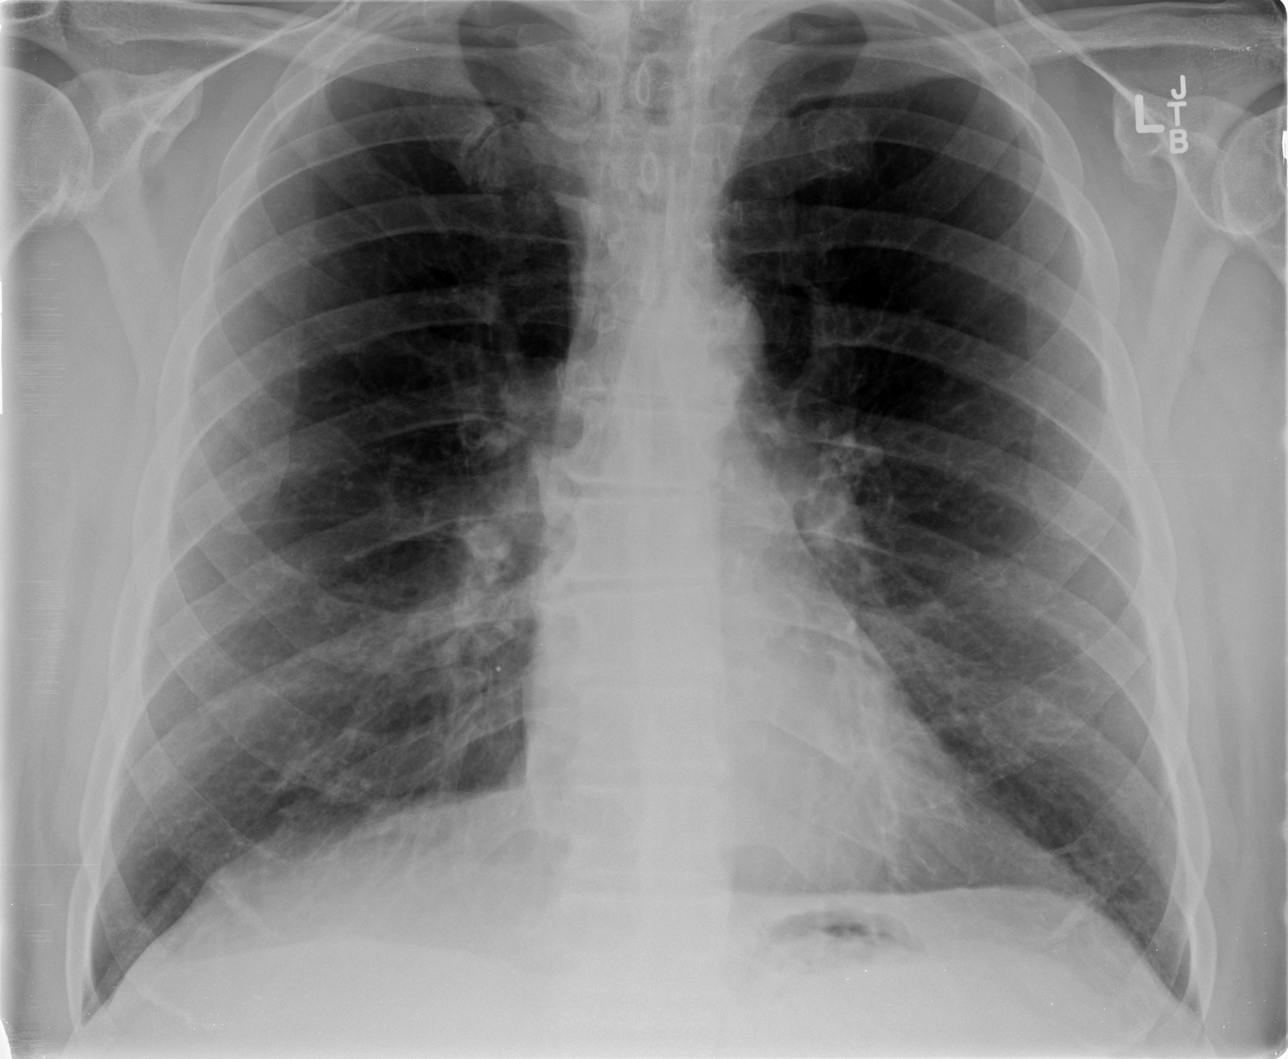

[view not recorded (3 of 3)]
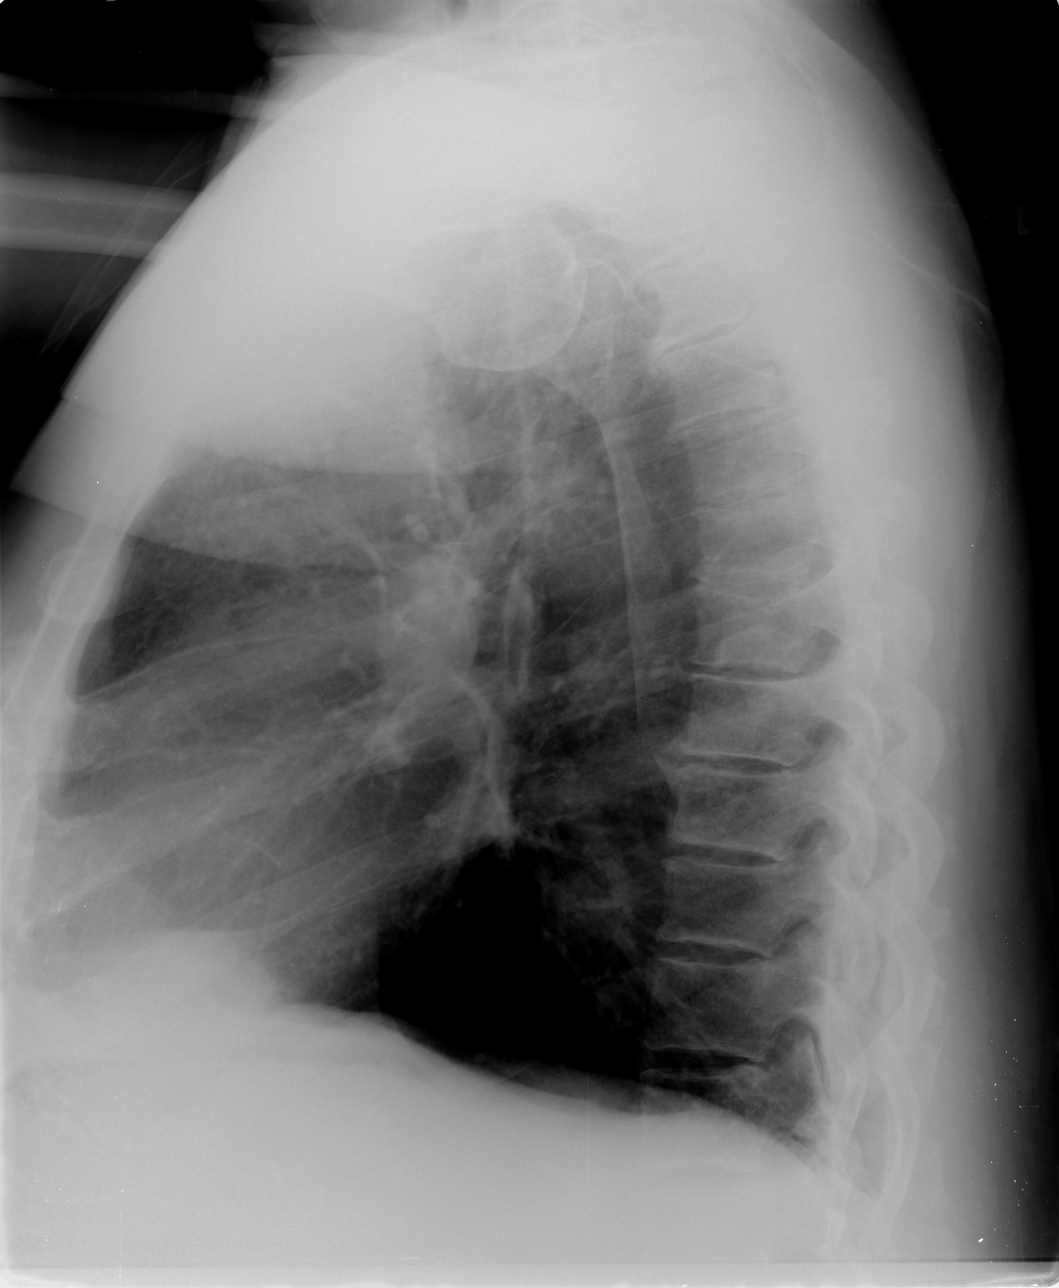

[3 of 3 positions shown; findings below may reference images not displayed]

FINDINGS: Grossly unchanged cardiac silhouette and mediastinal contours.  The
lungs are hyperexpanded with flattening of bilateral hemidiaphragms
and mild diffuse thickening of the pulmonary interstitium.  No
focal airspace opacities.  No pleural effusion or pneumothorax.
Unchanged bones.
IMPRESSION: Hyperexpanded lungs and bronchitic change without acute
cardiopulmonary disease.

## 2014-06-09 ENCOUNTER — Other Ambulatory Visit: Payer: Self-pay

## 2014-08-30 DIAGNOSIS — C61 Malignant neoplasm of prostate: Secondary | ICD-10-CM | POA: Diagnosis not present

## 2014-09-06 DIAGNOSIS — C61 Malignant neoplasm of prostate: Secondary | ICD-10-CM | POA: Diagnosis not present

## 2014-09-06 DIAGNOSIS — N393 Stress incontinence (female) (male): Secondary | ICD-10-CM | POA: Diagnosis not present

## 2014-09-06 DIAGNOSIS — C801 Malignant (primary) neoplasm, unspecified: Secondary | ICD-10-CM | POA: Diagnosis not present

## 2014-10-06 ENCOUNTER — Encounter: Payer: Self-pay | Admitting: Cardiovascular Disease

## 2014-10-06 ENCOUNTER — Ambulatory Visit (INDEPENDENT_AMBULATORY_CARE_PROVIDER_SITE_OTHER): Payer: Medicare Other | Admitting: Cardiovascular Disease

## 2014-10-06 VITALS — BP 112/76 | HR 82 | Resp 16 | Ht 74.0 in | Wt 281.2 lb

## 2014-10-06 DIAGNOSIS — E782 Mixed hyperlipidemia: Secondary | ICD-10-CM

## 2014-10-06 DIAGNOSIS — E785 Hyperlipidemia, unspecified: Secondary | ICD-10-CM | POA: Diagnosis not present

## 2014-10-06 DIAGNOSIS — J449 Chronic obstructive pulmonary disease, unspecified: Secondary | ICD-10-CM | POA: Diagnosis not present

## 2014-10-06 DIAGNOSIS — E669 Obesity, unspecified: Secondary | ICD-10-CM | POA: Diagnosis not present

## 2014-10-06 DIAGNOSIS — I10 Essential (primary) hypertension: Secondary | ICD-10-CM | POA: Diagnosis not present

## 2014-10-06 NOTE — Patient Instructions (Signed)
Dr. Croitoru recommends that you schedule a follow-up appointment in: One year.   

## 2014-10-07 ENCOUNTER — Encounter: Payer: Self-pay | Admitting: Cardiovascular Disease

## 2014-10-07 NOTE — Progress Notes (Signed)
Patient ID: [name], male   DOB: Mar 01, 1956, 59 y.o.   MRN: [id number]     Reason for office visit HTN, mixed hyperlipidemia  Deniz is here with his wife , Almyra Free. He is coping very well with his diagnosis of metastatic prostate carcinoma, bur has hot flashes and dyspnea when he receives Lupron shots. Megace helps. No other cardiac complaints. He has successfully stayed away from cigarettes now for 18 months. He has treated mixed hyperlipidemia (especially prominent low HDL cholesterol) and treated hypertension. He remains obese with a BMI that has remains essentially unchanged at 36. He has obstructive sleep apnea treated with both CPAP and Nuvigil. Coronary angiography in 2011 did not show any evidence of CAD and he had normal filling pressures by right and left heart catheterization. He had labs in September in [place] and we will request copies.  Allergies  Allergen Reactions  . Penicillins Anaphylaxis    Current Outpatient Prescriptions  Medication Sig Dispense Refill  . Calcium Carb-Cholecalciferol (CALCIUM 1000 + D PO) Take 1 tablet by mouth 2 (two) times daily.     . Choline Fenofibrate (TRILIPIX) 135 MG capsule Take 135 mg by mouth daily.    . Coenzyme Q10 (CO Q 10) 100 MG CAPS Take 1 capsule by mouth daily.    . cyanocobalamin (,VITAMIN B-12,) 1000 MCG/ML injection Inject 1 mL into the muscle every 30 (thirty) days.  0  . desipramine (NOPRAMIN) 100 MG tablet Take 1 tablet by mouth at bedtime.  0  . enalapril (VASOTEC) 10 MG tablet Take 10 mg by mouth every morning.     . ferrous sulfate 325 (65 FE) MG tablet Take 1 tablet by mouth daily.  0  . Krill Oil 300 MG CAPS Take 300 mg by mouth daily.    [name] leuprolide (LUPRON) 30 MG injection Inject 30 mg into the muscle every 3 (three) months.    . megestrol (MEGACE) 20 MG tablet Take 0.5 tablets by mouth 2 (two) times daily.  0  . meloxicam (MOBIC) 7.5 MG tablet Take 1 tablet by mouth as needed.  0  . Multiple Vitamin  (MULTIVITAMIN WITH MINERALS) TABS tablet Take 1 tablet by mouth daily.    . pantoprazole (PROTONIX) 40 MG tablet Take 1 tablet (40 mg total) by mouth daily. 30 tablet 11  . REXULTI 2 MG TABS Take 1 tablet by mouth at bedtime.  0  . simvastatin (ZOCOR) 40 MG tablet Take 40 mg by mouth every evening.    . venlafaxine XR (EFFEXOR-XR) 75 MG 24 hr capsule Take 1 capsule by mouth daily.  0  . FLUoxetine (PROZAC) 20 MG capsule Take 80 mg by mouth every morning.    [name] HYDROcodone-acetaminophen (NORCO) 5-325 MG per tablet Take 1-2 tablets by mouth every 6 (six) hours as needed. (Patient not taking: Reported on 10/06/2014) 30 tablet 0  . sulfamethoxazole-trimethoprim (BACTRIM DS) 800-160 MG per tablet Take 1 tablet by mouth 2 (two) times daily. (Patient not taking: Reported on 10/06/2014) 10 tablet 0   No current facility-administered medications for this visit.    Past Medical History  Diagnosis Date  . Hypertension   . Shortness of breath   . Anxiety   . Urinary incontinence     due to prostate removal  . Hypercholesteremia   . Arthritis   . Decreased pulse 08/16/2008    LE doppler - bilateral ABIs normal, bilateral PVRs normal waveform; bilateral common iliac arteries difficult to visualize however velocities suggest less than  50% diameter reduction; bilateral LE normal velocities w/o evidence of diameter reduction  . COPD (chronic obstructive pulmonary disease)   . Tobacco abuse   . Cancer 2007    prostate CA  . Colon cancer   . GERD (gastroesophageal reflux disease)   . Bladder stone   . Mental disorder     PTSD  . Depression   . Sleep apnea     wears CPAP nightly, settings at 14     Past Surgical History  Procedure Laterality Date  . Cervical fusion    . Prostate surgery    . Bladder suspension      x 2  . Wrist surgery Right   . Colonoscopy    . Cardiac catheterization      no PCI  . Anterior lat lumbar fusion N/A 11/17/2012    Procedure: XLIF L3 - L4 POSTERIOR L3 -L4 FUSION  INSTRUMENTATION/L2 - L3 DECOMPRESSION (REVISION OF POSTERIOR HARDWARE) 1 LEVEL;  Surgeon: Melina Schools, MD;  Location: [place];  Service: Orthopedics;  Laterality: N/A;  procedure #1: start 0923, end 1057.   . Transthoracic echocardiogram  08/16/2008    essentially normal for age  . Cardiovascular stress test  08/16/2008    EF 57%; inferior region has diaphragmatic attenuation, otherwise normal perfusion w/o evidence of ischemia or infarct; LV normal in size; no scintographic evidence of inducible ischemia; no ECG changes, negative for ischemia; low risk scan  . Colonoscopy N/A 05/27/2013    Procedure: COLONOSCOPY;  Surgeon: Daneil Dolin, MD;  [facility];  Service: Endoscopy;  Laterality: N/A;  1:15  . Esophagogastroduodenoscopy (egd) with esophageal dilation N/A 05/27/2013    Procedure: ESOPHAGOGASTRODUODENOSCOPY (EGD) WITH ESOPHAGEAL DILATION;  Surgeon: Daneil Dolin, MD;  [facility];  Service: Endoscopy;  Laterality: N/A;  . Colon resection N/A 07/12/2013    Procedure: DIAGNOSTIC LAPAROSCOPY; PERITONEAL NODULE;  Surgeon: Joyice Faster. Cornett, MD;  Location: [place];  Service: General;  Laterality: N/A;  . Back surgery  2010 / 2014  . Cystoscopy w/ ureteral stent placement N/A 10/03/2013    Procedure: CYSTOSCOPY AND REMOVAL OF BLADDER NECK STONE ;  Surgeon: Dutch Gray, MD;  Location[facility];  Service: Urology;  Laterality: N/A;  WITH REMOVAL OF BLADDER NECK STONE    . Holmium laser application N/A 01/24/6072    Procedure: HOLMIUM LASER APPLICATION;  Surgeon: Dutch Gray, MD;  Location: [facility];  Service: Urology;  Laterality: N/A;  . Urinary sphincter implant N/A 01/31/2014    Procedure: IMPLANTATION ARTIFICIAL SPHINCTER  WITH CYSTOSCOPY;  Surgeon: Reece Packer, MD;  [facility];  Service: Urology;  Laterality: N/A;    Family History  Problem Relation Age of Onset  . Diabetes Mother   . Hypertension Mother   . Heart disease Mother   . Diabetes Father   .  Hypertension Father   . Hypertension Brother   . Diabetes Brother   . Colon cancer Neg Hx   . Colon polyps Father     age 87    History   Social History  . Marital Status: Married    Spouse Name: N/A  . Number of Children: N/A  . Years of Education: N/A   Occupational History  . Not on file.   Social History Main Topics  . Smoking status: Former Smoker -- 0.25 packs/day for 43 years    Types: Cigarettes  . Smokeless tobacco: Former Systems developer  . Alcohol Use: No  . Drug Use: No  .  Sexual Activity: Not on file   Other Topics Concern  . Not on file   Social History Narrative    Review of systems: The patient specifically denies any chest pain at rest or with exertion, orthopnea, paroxysmal nocturnal dyspnea, syncope, palpitations, focal neurological deficits, intermittent claudication, lower extremity edema, unexplained weight gain, cough, hemoptysis or wheezing.  The patient also denies abdominal pain, nausea, vomiting, dysphagia, diarrhea, constipation, polyuria, polydipsia, dysuria, hematuria, frequency, urgency, abnormal bleeding or bruising, fever, chills, unexpected weight changes, mood swings, change in skin or hair texture, change in voice quality, auditory or visual problems, allergic reactions or rashes, new musculoskeletal complaints other than usual "aches and pains".   PHYSICAL EXAM BP 112/76 mmHg  Pulse 82  Resp 16  Ht 6\' 2"  (1.88 m)  Wt 127.551 kg (281 lb 3.2 oz)  BMI 36.09 kg/m2  General: Alert, oriented x3, no distress Head: no evidence of trauma, PERRL, EOMI, no exophtalmos or lid lag, no myxedema, no xanthelasma; normal ears, nose and oropharynx Neck: normal jugular venous pulsations and no hepatojugular reflux; brisk carotid pulses without delay and no carotid bruits Chest: clear to auscultation, no signs of consolidation by percussion or palpation, normal fremitus, symmetrical and full respiratory excursions Cardiovascular: normal position and quality of  the apical impulse, regular rhythm, normal first and second heart sounds, no murmurs, rubs or gallops Abdomen: no tenderness or distention, no masses by palpation, no abnormal pulsatility or arterial bruits, normal bowel sounds, no hepatosplenomegaly Extremities: no clubbing, cyanosis or edema; 2+ radial, ulnar and brachial pulses bilaterally; 2+ right femoral, posterior tibial and dorsalis pedis pulses; 2+ left femoral, posterior tibial and dorsalis pedis pulses; no subclavian or femoral bruits Neurological: grossly nonfocal   EKG: NSR  Lipid Panel  No results found for: CHOL, TRIG, HDL, CHOLHDL, VLDL, LDLCALC, LDLDIRECT  BMET    Component Value Date/Time   NA 140 02/01/2014 0327   K 4.2 02/01/2014 0327   CL 106 02/01/2014 0327   CO2 24 02/01/2014 0327   GLUCOSE 137* 02/01/2014 0327   BUN 8 02/01/2014 0327   CREATININE 0.88 02/01/2014 0327   CALCIUM 9.0 02/01/2014 0327   GFRNONAA >90 02/01/2014 0327   GFRAA >90 02/01/2014 0327     ASSESSMENT AND PLAN Obesity and low HDL remain Ted's outstanding risk factors for heart disease and are inextricably linked to each other. Otherwise his LDL-C, BP and sleep apnea are all well addressed and he is congratulated on maintenance of smoking cesation. Patient Instructions  Dr. Sallyanne Kuster recommends that you schedule a follow-up appointment in: One year.        Orders Placed This Encounter  Procedures  . EKG 12-Lead   Meds ordered this encounter  Medications  . REXULTI 2 MG TABS    Sig: Take 1 tablet by mouth at bedtime.    Refill:  0  . cyanocobalamin (,VITAMIN B-12,) 1000 MCG/ML injection    Sig: Inject 1 mL into the muscle every 30 (thirty) days.    Refill:  0  . desipramine (NOPRAMIN) 100 MG tablet    Sig: Take 1 tablet by mouth at bedtime.    Refill:  0  . ferrous sulfate 325 (65 FE) MG tablet    Sig: Take 1 tablet by mouth daily.    Refill:  0  . meloxicam (MOBIC) 7.5 MG tablet    Sig: Take 1 tablet by mouth as  needed.    Refill:  0  . megestrol (MEGACE) 20 MG tablet  Sig: Take 0.5 tablets by mouth 2 (two) times daily.    Refill:  0  . venlafaxine XR (EFFEXOR-XR) 75 MG 24 hr capsule    Sig: Take 1 capsule by mouth daily.    Refill:  0  . leuprolide (LUPRON) 30 MG injection    Sig: Inject 30 mg into the muscle every 3 (three) months.    Holli Humbles, MD, [place] [phone] office [phone] pager

## 2014-11-08 ENCOUNTER — Other Ambulatory Visit: Payer: Self-pay

## 2014-11-09 MED ORDER — PANTOPRAZOLE SODIUM 40 MG PO TBEC: 40.0000 mg | DELAYED_RELEASE_TABLET | Freq: Every day | ORAL | Status: DC

## 2014-11-17 DIAGNOSIS — G4733 Obstructive sleep apnea (adult) (pediatric): Secondary | ICD-10-CM | POA: Diagnosis not present

## 2015-01-12 DIAGNOSIS — C61 Malignant neoplasm of prostate: Secondary | ICD-10-CM | POA: Diagnosis not present

## 2015-01-19 DIAGNOSIS — Z8709 Personal history of other diseases of the respiratory system: Secondary | ICD-10-CM | POA: Diagnosis not present

## 2015-01-19 DIAGNOSIS — C61 Malignant neoplasm of prostate: Secondary | ICD-10-CM | POA: Diagnosis not present

## 2015-01-19 DIAGNOSIS — C801 Malignant (primary) neoplasm, unspecified: Secondary | ICD-10-CM | POA: Diagnosis not present

## 2015-01-23 DIAGNOSIS — N393 Stress incontinence (female) (male): Secondary | ICD-10-CM | POA: Diagnosis not present

## 2015-01-23 DIAGNOSIS — N3946 Mixed incontinence: Secondary | ICD-10-CM | POA: Diagnosis not present

## 2015-01-23 DIAGNOSIS — R350 Frequency of micturition: Secondary | ICD-10-CM | POA: Diagnosis not present

## 2015-03-23 DIAGNOSIS — Z719 Counseling, unspecified: Secondary | ICD-10-CM | POA: Diagnosis not present

## 2015-03-23 DIAGNOSIS — Z6836 Body mass index (BMI) 36.0-36.9, adult: Secondary | ICD-10-CM | POA: Diagnosis not present

## 2015-03-23 DIAGNOSIS — E6609 Other obesity due to excess calories: Secondary | ICD-10-CM | POA: Diagnosis not present

## 2015-03-23 DIAGNOSIS — E669 Obesity, unspecified: Secondary | ICD-10-CM | POA: Diagnosis not present

## 2015-03-23 DIAGNOSIS — J449 Chronic obstructive pulmonary disease, unspecified: Secondary | ICD-10-CM | POA: Diagnosis not present

## 2015-03-23 DIAGNOSIS — Z1389 Encounter for screening for other disorder: Secondary | ICD-10-CM | POA: Diagnosis not present

## 2015-03-23 DIAGNOSIS — C61 Malignant neoplasm of prostate: Secondary | ICD-10-CM | POA: Diagnosis not present

## 2015-03-23 DIAGNOSIS — Z0001 Encounter for general adult medical examination with abnormal findings: Secondary | ICD-10-CM | POA: Diagnosis not present

## 2015-03-26 DIAGNOSIS — G4733 Obstructive sleep apnea (adult) (pediatric): Secondary | ICD-10-CM | POA: Diagnosis not present

## 2015-03-26 DIAGNOSIS — D649 Anemia, unspecified: Secondary | ICD-10-CM | POA: Diagnosis not present

## 2015-03-27 DIAGNOSIS — G4733 Obstructive sleep apnea (adult) (pediatric): Secondary | ICD-10-CM | POA: Diagnosis not present

## 2015-03-30 ENCOUNTER — Other Ambulatory Visit: Payer: Self-pay | Admitting: Gastroenterology

## 2015-05-15 DIAGNOSIS — C61 Malignant neoplasm of prostate: Secondary | ICD-10-CM | POA: Diagnosis not present

## 2015-05-23 DIAGNOSIS — N393 Stress incontinence (female) (male): Secondary | ICD-10-CM | POA: Diagnosis not present

## 2015-05-23 DIAGNOSIS — R232 Flushing: Secondary | ICD-10-CM | POA: Diagnosis not present

## 2015-05-23 DIAGNOSIS — C61 Malignant neoplasm of prostate: Secondary | ICD-10-CM | POA: Diagnosis not present

## 2015-07-12 DIAGNOSIS — G4733 Obstructive sleep apnea (adult) (pediatric): Secondary | ICD-10-CM | POA: Diagnosis not present

## 2015-08-08 DIAGNOSIS — H43393 Other vitreous opacities, bilateral: Secondary | ICD-10-CM | POA: Diagnosis not present

## 2015-08-09 DIAGNOSIS — N393 Stress incontinence (female) (male): Secondary | ICD-10-CM | POA: Diagnosis not present

## 2015-08-09 DIAGNOSIS — N200 Calculus of kidney: Secondary | ICD-10-CM | POA: Diagnosis not present

## 2015-09-11 DIAGNOSIS — Z981 Arthrodesis status: Secondary | ICD-10-CM | POA: Diagnosis not present

## 2015-09-11 DIAGNOSIS — M5136 Other intervertebral disc degeneration, lumbar region: Secondary | ICD-10-CM | POA: Diagnosis not present

## 2015-09-11 DIAGNOSIS — M5442 Lumbago with sciatica, left side: Secondary | ICD-10-CM | POA: Diagnosis not present

## 2015-09-12 ENCOUNTER — Other Ambulatory Visit: Payer: Self-pay | Admitting: Orthopedic Surgery

## 2015-09-12 DIAGNOSIS — Z981 Arthrodesis status: Secondary | ICD-10-CM

## 2015-09-18 ENCOUNTER — Ambulatory Visit
Admission: RE | Admit: 2015-09-18 | Discharge: 2015-09-18 | Disposition: A | Discharge status: Home / Self Care | Payer: Medicare Other | Source: Ambulatory Visit | Attending: Orthopedic Surgery | Admitting: Orthopedic Surgery

## 2015-09-18 ENCOUNTER — Ambulatory Visit
Admission: RE | Admit: 2015-09-18 | Discharge: 2015-09-18 | Disposition: A | Discharge status: Home / Self Care | Payer: Self-pay | Source: Ambulatory Visit | Attending: Orthopedic Surgery | Admitting: Orthopedic Surgery

## 2015-09-18 ENCOUNTER — Other Ambulatory Visit: Payer: Self-pay | Admitting: Orthopedic Surgery

## 2015-09-18 DIAGNOSIS — M4806 Spinal stenosis, lumbar region: Secondary | ICD-10-CM | POA: Diagnosis not present

## 2015-09-18 DIAGNOSIS — Z981 Arthrodesis status: Secondary | ICD-10-CM

## 2015-09-18 MED ORDER — DIAZEPAM 5 MG PO TABS
10.0000 mg | ORAL_TABLET | Freq: Once | ORAL | Status: AC
  Administered 2015-09-18: 10 mg via ORAL

## 2015-09-18 MED ORDER — ONDANSETRON HCL 4 MG/2ML IJ SOLN
4.0000 mg | Freq: Once | INTRAMUSCULAR | Status: AC
  Administered 2015-09-18: 4 mg via INTRAMUSCULAR

## 2015-09-18 MED ORDER — IOHEXOL 180 MG/ML  SOLN
15.0000 mL | Freq: Once | INTRAMUSCULAR | Status: AC | PRN
  Administered 2015-09-18: 15 mL via INTRATHECAL

## 2015-09-18 MED ORDER — MEPERIDINE HCL 100 MG/ML IJ SOLN
100.0000 mg | Freq: Once | INTRAMUSCULAR | Status: AC
  Administered 2015-09-18: 100 mg via INTRAMUSCULAR

## 2015-09-18 NOTE — Discharge Instructions (Signed)
°  Myelogram Discharge Instructions  1. Go home and rest quietly for the next 24 hours.  It is important to lie flat for the next 24 hours.  Get up only to go to the restroom.  You may lie in the bed or on a couch on your back, your stomach, your left side or your right side.  You may have one pillow under your head.  You may have pillows between your knees while you are on your side or under your knees while you are on your back.  2. DO NOT drive today.  Recline the seat as far back as it will go, while still wearing your seat belt, on the way home.  3. You may get up to go to the bathroom as needed.  You may sit up for 10 minutes to eat.  You may resume your normal diet and medications unless otherwise indicated.  Drink lots of extra fluids today and tomorrow.  4. The incidence of headache, nausea, or vomiting is about 5% (one in 20 patients).  If you develop a headache, lie flat and drink plenty of fluids until the headache goes away.  Caffeinated beverages may be helpful.  If you develop severe nausea and vomiting or a headache that does not go away with flat bed rest, call [phone].  5. You may resume normal activities after your 24 hours of bed rest is over; however, do not exert yourself strongly or do any heavy lifting tomorrow. If when you get up you have a headache when standing, go back to bed and force fluids for another 24 hours.  6. Call your physician for a follow-up appointment.  The results of your myelogram will be sent directly to your physician by the following day.  7. If you have any questions or if complications develop after you arrive home, please call [phone].  Discharge instructions have been explained to the patient.  The patient, or the person responsible for the patient, fully understands these instructions.       May resume Nopramine and Effexor on Jan. 25, 2017, after 9:30 am.

## 2015-09-18 NOTE — Progress Notes (Signed)
Patient states he has been off Effexor and Norpramin for at least the past two days.

## 2015-09-19 DIAGNOSIS — C61 Malignant neoplasm of prostate: Secondary | ICD-10-CM | POA: Diagnosis not present

## 2015-09-26 DIAGNOSIS — C61 Malignant neoplasm of prostate: Secondary | ICD-10-CM | POA: Diagnosis not present

## 2015-09-26 DIAGNOSIS — R232 Flushing: Secondary | ICD-10-CM | POA: Diagnosis not present

## 2015-09-26 DIAGNOSIS — Z0000 Encounter for general adult medical examination without abnormal findings: Secondary | ICD-10-CM | POA: Diagnosis not present

## 2015-09-26 DIAGNOSIS — C801 Malignant (primary) neoplasm, unspecified: Secondary | ICD-10-CM | POA: Diagnosis not present

## 2015-09-28 DIAGNOSIS — M5442 Lumbago with sciatica, left side: Secondary | ICD-10-CM | POA: Diagnosis not present

## 2015-09-28 DIAGNOSIS — M4802 Spinal stenosis, cervical region: Secondary | ICD-10-CM | POA: Diagnosis not present

## 2015-09-28 DIAGNOSIS — M40209 Unspecified kyphosis, site unspecified: Secondary | ICD-10-CM | POA: Diagnosis not present

## 2015-09-28 DIAGNOSIS — Z981 Arthrodesis status: Secondary | ICD-10-CM | POA: Diagnosis not present

## 2015-10-06 DIAGNOSIS — M5136 Other intervertebral disc degeneration, lumbar region: Secondary | ICD-10-CM | POA: Diagnosis not present

## 2015-10-06 DIAGNOSIS — G894 Chronic pain syndrome: Secondary | ICD-10-CM | POA: Diagnosis not present

## 2015-10-06 DIAGNOSIS — Z981 Arthrodesis status: Secondary | ICD-10-CM | POA: Diagnosis not present

## 2015-10-06 DIAGNOSIS — M961 Postlaminectomy syndrome, not elsewhere classified: Secondary | ICD-10-CM | POA: Diagnosis not present

## 2015-10-17 DIAGNOSIS — G4733 Obstructive sleep apnea (adult) (pediatric): Secondary | ICD-10-CM | POA: Diagnosis not present

## 2015-10-22 DIAGNOSIS — M961 Postlaminectomy syndrome, not elsewhere classified: Secondary | ICD-10-CM | POA: Diagnosis not present

## 2015-10-22 DIAGNOSIS — M5136 Other intervertebral disc degeneration, lumbar region: Secondary | ICD-10-CM | POA: Diagnosis not present

## 2015-10-22 DIAGNOSIS — G894 Chronic pain syndrome: Secondary | ICD-10-CM | POA: Diagnosis not present

## 2015-11-29 ENCOUNTER — Other Ambulatory Visit: Payer: Self-pay | Admitting: Urology

## 2015-11-29 DIAGNOSIS — Z79899 Other long term (current) drug therapy: Secondary | ICD-10-CM

## 2015-12-04 ENCOUNTER — Other Ambulatory Visit: Payer: Self-pay | Admitting: Urology

## 2015-12-04 DIAGNOSIS — Z79899 Other long term (current) drug therapy: Secondary | ICD-10-CM

## 2016-01-04 DIAGNOSIS — M961 Postlaminectomy syndrome, not elsewhere classified: Secondary | ICD-10-CM | POA: Diagnosis not present

## 2016-01-04 DIAGNOSIS — M47816 Spondylosis without myelopathy or radiculopathy, lumbar region: Secondary | ICD-10-CM | POA: Diagnosis not present

## 2016-01-04 DIAGNOSIS — M5405 Panniculitis affecting regions of neck and back, thoracolumbar region: Secondary | ICD-10-CM | POA: Diagnosis not present

## 2016-01-04 DIAGNOSIS — G8929 Other chronic pain: Secondary | ICD-10-CM | POA: Diagnosis not present

## 2016-01-17 ENCOUNTER — Other Ambulatory Visit: Payer: Self-pay | Admitting: Urology

## 2016-01-17 DIAGNOSIS — Z79899 Other long term (current) drug therapy: Secondary | ICD-10-CM

## 2016-01-17 DIAGNOSIS — C7951 Secondary malignant neoplasm of bone: Secondary | ICD-10-CM

## 2016-01-23 ENCOUNTER — Ambulatory Visit
Admission: RE | Admit: 2016-01-23 | Discharge: 2016-01-23 | Disposition: A | Discharge status: Home / Self Care | Payer: Medicare Other | Source: Ambulatory Visit | Attending: Urology | Admitting: Urology

## 2016-01-23 DIAGNOSIS — C7951 Secondary malignant neoplasm of bone: Secondary | ICD-10-CM

## 2016-01-23 DIAGNOSIS — Z79899 Other long term (current) drug therapy: Secondary | ICD-10-CM

## 2016-01-23 DIAGNOSIS — Z13820 Encounter for screening for osteoporosis: Secondary | ICD-10-CM | POA: Diagnosis not present

## 2016-01-23 DIAGNOSIS — C61 Malignant neoplasm of prostate: Secondary | ICD-10-CM | POA: Diagnosis not present

## 2016-01-24 DIAGNOSIS — G4733 Obstructive sleep apnea (adult) (pediatric): Secondary | ICD-10-CM | POA: Diagnosis not present

## 2016-01-30 DIAGNOSIS — C61 Malignant neoplasm of prostate: Secondary | ICD-10-CM | POA: Diagnosis not present

## 2016-02-15 ENCOUNTER — Other Ambulatory Visit: Payer: Self-pay | Admitting: Gastroenterology

## 2016-02-15 NOTE — Telephone Encounter (Signed)
RF X 1. It has been more than two years since we saw this patient. He needs ov for further refills OR he can get from PCP.

## 2016-02-18 ENCOUNTER — Encounter: Payer: Self-pay | Admitting: Internal Medicine

## 2016-02-18 NOTE — Telephone Encounter (Signed)
Please schedule ov.  

## 2016-02-18 NOTE — Telephone Encounter (Signed)
APPT MADE AND LETTER SENT  °

## 2016-03-14 DIAGNOSIS — Z981 Arthrodesis status: Secondary | ICD-10-CM | POA: Diagnosis not present

## 2016-03-14 DIAGNOSIS — G894 Chronic pain syndrome: Secondary | ICD-10-CM | POA: Diagnosis not present

## 2016-03-14 DIAGNOSIS — M961 Postlaminectomy syndrome, not elsewhere classified: Secondary | ICD-10-CM | POA: Diagnosis not present

## 2016-04-02 ENCOUNTER — Ambulatory Visit (INDEPENDENT_AMBULATORY_CARE_PROVIDER_SITE_OTHER): Payer: Medicare Other | Admitting: Gastroenterology

## 2016-04-02 ENCOUNTER — Encounter: Payer: Self-pay | Admitting: Gastroenterology

## 2016-04-02 DIAGNOSIS — K2100 Gastro-esophageal reflux disease with esophagitis, without bleeding: Secondary | ICD-10-CM

## 2016-04-02 DIAGNOSIS — K5903 Drug induced constipation: Secondary | ICD-10-CM | POA: Diagnosis not present

## 2016-04-02 DIAGNOSIS — K219 Gastro-esophageal reflux disease without esophagitis: Secondary | ICD-10-CM | POA: Insufficient documentation

## 2016-04-02 MED ORDER — PANTOPRAZOLE SODIUM 40 MG PO TBEC: 40.0000 mg | DELAYED_RELEASE_TABLET | Freq: Every day | ORAL | 3 refills | Status: DC

## 2016-04-02 MED ORDER — LUBIPROSTONE 24 MCG PO CAPS: 24.0000 ug | ORAL_CAPSULE | Freq: Two times a day (BID) | ORAL | 3 refills | Status: DC

## 2016-04-02 NOTE — Assessment & Plan Note (Signed)
Well-controlled on protonix daily. Continue antireflux measures.

## 2016-04-02 NOTE — Progress Notes (Signed)
Primary Care Physician: Glo Herring., MD  Primary Gastroenterologist:  Garfield Cornea, MD   Chief Complaint  Patient presents with  . Medication Refill  . Colonoscopy    ? repeat    HPI: [name] is a 60 y.o. male here for follow up for medication refills. He is not due a colonoscopy, he will be due in 2019 for adenomatous colon polyps. We last saw him in 2014 for a colonoscopy due to abnormal CT scan showing  3.5 x 1.8 cm soft tissue density adjacent to the cecum is or the location which would be occupied by the appendix.CT was done for rising PSA in setting of previously treated prostate cancer (remotely). Colonoscopy was done and showed extrinsic compression in the area of the appendix. Subsequent surgery by Dr. Brantley Stage (bx) showed metastatic disease from the prostate cancer at the cecum. Per patient, never had resection, states he was advised against it for fear of "seeding" the cancer. He was started on Lupron and has been following four times a year by Dr. Alinda Money with excellence response in this PSA.  Patient states his gerd is well-controlled on protonix. He rarely has heartburn, only if he eats a trigger food. He complains of constipation on percocet. BM every 3 days. Remotely saw some brbpr with straining once. Uses Prune juice and stool softener but not enough. Takes iron per PCP. Has follow up for physical next month for labs and plans to have his anemia rechecked by PCP.     Current Outpatient Prescriptions  Medication Sig Dispense Refill  . Calcium Carb-Cholecalciferol (CALCIUM 1000 + D PO) Take 1 tablet by mouth 2 (two) times daily.     . Choline Fenofibrate (TRILIPIX) 135 MG capsule Take 135 mg by mouth daily.    . Coenzyme Q10 (CO Q 10) 100 MG CAPS Take 1 capsule by mouth daily.    [name] desipramine (NOPRAMIN) 100 MG tablet Take 1 tablet by mouth at bedtime.  0  . enalapril (VASOTEC) 10 MG tablet Take 10 mg by mouth every morning.     . ferrous sulfate 325 (65  FE) MG tablet Take 1 tablet by mouth daily.  0  . Krill Oil 300 MG CAPS Take 300 mg by mouth daily.    [name] leuprolide (LUPRON) 30 MG injection Inject 30 mg into the muscle every 3 (three) months.    . megestrol (MEGACE) 20 MG tablet Take 0.5 tablets by mouth 2 (two) times daily.  0  . meloxicam (MOBIC) 7.5 MG tablet Take 1 tablet by mouth as needed.  0  . Multiple Vitamin (MULTIVITAMIN WITH MINERALS) TABS tablet Take 1 tablet by mouth daily.    [name] oxyCODONE-acetaminophen (PERCOCET) 10-325 MG tablet daily as needed.  0  . pantoprazole (PROTONIX) 40 MG tablet Take 1 tablet by mouth  daily 90 tablet 0  . simvastatin (ZOCOR) 40 MG tablet Take 40 mg by mouth every evening.    . venlafaxine XR (EFFEXOR-XR) 75 MG 24 hr capsule Take 1 capsule by mouth daily.  0  . cyanocobalamin (,VITAMIN B-12,) 1000 MCG/ML injection Inject 1 mL into the muscle every 30 (thirty) days.  0   No current facility-administered medications for this visit.     Allergies as of 04/02/2016 - Review Complete 04/02/2016  Allergen Reaction Noted  . Penicillins Anaphylaxis 09/30/2011   Past Medical History:  Diagnosis Date  . Anxiety   . Arthritis   . Bladder stone   . Cancer ([place])  2007   prostate CA  . Colon cancer ([place])   . COPD (chronic obstructive pulmonary disease) ([place])   . Decreased pulse 08/16/2008   LE doppler - bilateral ABIs normal, bilateral PVRs normal waveform; bilateral common iliac arteries difficult to visualize however velocities suggest less than 50% diameter reduction; bilateral LE normal velocities w/o evidence of diameter reduction  . Depression   . GERD (gastroesophageal reflux disease)   . Hypercholesteremia   . Hypertension   . Mental disorder    PTSD  . Shortness of breath   . Sleep apnea    wears CPAP nightly, settings at 14   . Tobacco abuse   . Urinary incontinence    due to prostate removal   Past Surgical History:  Procedure Laterality Date  . ANTERIOR LAT LUMBAR FUSION N/A  11/17/2012   Procedure: XLIF L3 - L4 POSTERIOR L3 -L4 FUSION INSTRUMENTATION/L2 - L3 DECOMPRESSION (REVISION OF POSTERIOR HARDWARE) 1 LEVEL;  Surgeon: Melina Schools, MD;  Location: [place];  Service: Orthopedics;  Laterality: N/A;  procedure #1: start 0923, end 1057.   [name] BACK SURGERY  2010 / 2014  . BLADDER SUSPENSION     x 2  . CARDIAC CATHETERIZATION     no PCI  . CARDIOVASCULAR STRESS TEST  08/16/2008   EF 57%; inferior region has diaphragmatic attenuation, otherwise normal perfusion w/o evidence of ischemia or infarct; LV normal in size; no scintographic evidence of inducible ischemia; no ECG changes, negative for ischemia; low risk scan  . CERVICAL FUSION    . COLON RESECTION N/A 07/12/2013   peritoneal biopsy only, evidence of metastatic prostate cancer  . COLONOSCOPY    . COLONOSCOPY N/A 05/27/2013   RMR: Extrinsic mass effect at the level of the appendiceal orifice likely represents an appendiceal or periappendiceal process. Neovascular changes of the rectal mucosa consistent with prior history of radiation treatment. Multiple 3-5 mm polyps removed, several tubular adenomas. Diverticulosis.nex tcs 2019  . CYSTOSCOPY W/ URETERAL STENT PLACEMENT N/A 10/03/2013   Procedure: CYSTOSCOPY AND REMOVAL OF BLADDER NECK STONE ;  Surgeon: Dutch Gray, MD;  [facility];  Service: Urology;  Laterality: N/A;  WITH REMOVAL OF BLADDER NECK STONE    . ESOPHAGOGASTRODUODENOSCOPY (EGD) WITH ESOPHAGEAL DILATION N/A 05/27/2013   RMR: Mild erosive reflux esophagitis with soft noncritical appearing stricture status post Maloney dilation, antral erosions with reactive gastropathy but no H. pylori  . HOLMIUM LASER APPLICATION N/A [id number]   Procedure: HOLMIUM LASER APPLICATION;  Surgeon: Dutch Gray, MD;  [facility];  Service: Urology;  Laterality: N/A;  . PROSTATE SURGERY    . TRANSTHORACIC ECHOCARDIOGRAM  08/16/2008   essentially normal for age  . URINARY SPHINCTER IMPLANT N/A 01/31/2014   Procedure:  IMPLANTATION ARTIFICIAL SPHINCTER  WITH CYSTOSCOPY;  Surgeon: Reece Packer, MD;  [facility];  Service: Urology;  Laterality: N/A;  . WRIST SURGERY Right     ROS:  General: Negative for anorexia, weight loss, fever, chills, fatigue, weakness. ENT: Negative for hoarseness, difficulty swallowing , nasal congestion. CV: Negative for chest pain, angina, palpitations, dyspnea on exertion, peripheral edema.  Respiratory: Negative for dyspnea at rest, dyspnea on exertion, cough, sputum, wheezing.  GI: See history of present illness. GU:  Negative for dysuria, hematuria, urinary incontinence, urinary frequency, nocturnal urination.  Endo: Negative for unusual weight change.    Physical Examination:   BP 112/77   Pulse 83   Temp 99.4 F (37.4 C) (Oral)   Ht 6\' 1"  (1.854 m)  Wt 285 lb 3.2 oz (129.4 kg)   BMI 37.63 kg/m   General: Well-nourished, well-developed in no acute distress.  Eyes: No icterus. Mouth: Oropharyngeal mucosa moist and pink , no lesions erythema or exudate. Lungs: Clear to auscultation bilaterally.  Heart: Regular rate and rhythm, no murmurs rubs or gallops.  Abdomen: Bowel sounds are normal, nontender, nondistended, no hepatosplenomegaly or masses, no abdominal bruits or hernia , no rebound or guarding.   Extremities: No lower extremity edema. No clubbing or deformities. Neuro: Alert and oriented x 4   Skin: Warm and dry, no jaundice.   Psych: Alert and cooperative, normal mood and affect.

## 2016-04-02 NOTE — Assessment & Plan Note (Signed)
Trial of Amitiza 27mcg bid with food. Call if ongoing problems. Plan for colonoscopy for surveillance purposes in 02/2018 unless issues arise in the interim. Patient is to let us know if persistent anemia after he has labs with his pcp.

## 2016-04-02 NOTE — Patient Instructions (Signed)
1. Begin amitiza 64mcg twice daily with food for constipation.  2. Continue protonix once daily for acid reflux. 3. You will be due your next colonoscopy in 05/2018.

## 2016-04-09 ENCOUNTER — Encounter: Payer: Self-pay | Admitting: Cardiovascular Disease

## 2016-04-09 DIAGNOSIS — Z6836 Body mass index (BMI) 36.0-36.9, adult: Secondary | ICD-10-CM | POA: Diagnosis not present

## 2016-04-09 DIAGNOSIS — Z0001 Encounter for general adult medical examination with abnormal findings: Secondary | ICD-10-CM | POA: Diagnosis not present

## 2016-04-09 DIAGNOSIS — K219 Gastro-esophageal reflux disease without esophagitis: Secondary | ICD-10-CM | POA: Diagnosis not present

## 2016-04-09 DIAGNOSIS — I10 Essential (primary) hypertension: Secondary | ICD-10-CM | POA: Diagnosis not present

## 2016-04-09 DIAGNOSIS — Z1389 Encounter for screening for other disorder: Secondary | ICD-10-CM | POA: Diagnosis not present

## 2016-04-09 DIAGNOSIS — E6609 Other obesity due to excess calories: Secondary | ICD-10-CM | POA: Diagnosis not present

## 2016-04-14 DIAGNOSIS — D649 Anemia, unspecified: Secondary | ICD-10-CM | POA: Diagnosis not present

## 2016-04-14 DIAGNOSIS — E162 Hypoglycemia, unspecified: Secondary | ICD-10-CM | POA: Diagnosis not present

## 2016-04-14 DIAGNOSIS — Z0001 Encounter for general adult medical examination with abnormal findings: Secondary | ICD-10-CM | POA: Diagnosis not present

## 2016-04-24 ENCOUNTER — Ambulatory Visit (INDEPENDENT_AMBULATORY_CARE_PROVIDER_SITE_OTHER): Payer: Medicare Other | Admitting: Cardiovascular Disease

## 2016-04-24 ENCOUNTER — Encounter: Payer: Self-pay | Admitting: Cardiovascular Disease

## 2016-04-24 VITALS — BP 133/84 | HR 83 | Ht 74.0 in | Wt 283.0 lb

## 2016-04-24 DIAGNOSIS — J4489 Other specified chronic obstructive pulmonary disease: Secondary | ICD-10-CM

## 2016-04-24 DIAGNOSIS — E782 Mixed hyperlipidemia: Secondary | ICD-10-CM

## 2016-04-24 DIAGNOSIS — I10 Essential (primary) hypertension: Secondary | ICD-10-CM

## 2016-04-24 DIAGNOSIS — J449 Chronic obstructive pulmonary disease, unspecified: Secondary | ICD-10-CM | POA: Diagnosis not present

## 2016-04-24 DIAGNOSIS — E1169 Type 2 diabetes mellitus with other specified complication: Secondary | ICD-10-CM | POA: Insufficient documentation

## 2016-04-24 DIAGNOSIS — E119 Type 2 diabetes mellitus without complications: Secondary | ICD-10-CM

## 2016-04-24 DIAGNOSIS — E669 Obesity, unspecified: Secondary | ICD-10-CM | POA: Diagnosis not present

## 2016-04-24 NOTE — Patient Instructions (Signed)
NO CHANGE WITH CURRENT MEDICATIONS   Your physician wants you to follow-up in: [place]. You will receive a reminder letter in the mail two months in advance. If you don't receive a letter, please call our office to schedule the follow-up appointment.  If you need a refill on your cardiac medications before your next appointment, please call your pharmacy.

## 2016-04-24 NOTE — Progress Notes (Signed)
Cardiology Office Note    Date:  04/24/2016   ID:  [name], DOB 1956/07/18, MRN [id number]  PCP:  Glo Herring., MD  Cardiologist:   Sanda Klein, MD   Chief Complaint  Patient presents with  . Follow-up    sob when exerting self. edema in hands.     History of Present Illness:  [name] is a 60 y.o. male with obesity, hypertension, hyperlipidemia, COPD, recently diagnosed diabetes mellitus and widespread metastases prostate cancer with good response to anti-androgen therapy.  He has not had any cardiac problems since his last appointment, but remains severely obese and has developed diabetes mellitus. His most recent hemoglobin A1c was 6.4% (without pharmacological therapy). He does not have any complaints of angina, palpitations, syncope, leg edema or intermittent claudication, but he does describe mild exertional dyspnea.  He has successfully quit smoking. He does complain of hot flashes and sweating intermittently. His last PSA was only 0.05 (he sees Dr. Dutch Gray).   He has chronic problems with hypersomnolence and did not respond well to Nuvigil. We are asked to comment on the safety of stimulants for his daytime hypersomnolence, suggested by Juanita Laster. His blood pressure is well controlled, he is noted to have normal coronary arteries by previous angiography and he has not had problems with arrhythmia. I suspect it would be safe to use a low dose of stimulant.  Past Medical History:  Diagnosis Date  . Anxiety   . Arthritis   . Bladder stone   . Cancer [facility]) 2007   prostate CA  . Colon cancer ([place])   . COPD (chronic obstructive pulmonary disease) ([place])   . Decreased pulse 08/16/2008   LE doppler - bilateral ABIs normal, bilateral PVRs normal waveform; bilateral common iliac arteries difficult to visualize however velocities suggest less than 50% diameter reduction; bilateral LE normal velocities w/o evidence of diameter reduction  . Depression   .  GERD (gastroesophageal reflux disease)   . Hypercholesteremia   . Hypertension   . Mental disorder    PTSD  . Shortness of breath   . Sleep apnea    wears CPAP nightly, settings at 14   . Tobacco abuse   . Urinary incontinence    due to prostate removal    Past Surgical History:  Procedure Laterality Date  . ANTERIOR LAT LUMBAR FUSION N/A 11/17/2012   Procedure: XLIF L3 - L4 POSTERIOR L3 -L4 FUSION INSTRUMENTATION/L2 - L3 DECOMPRESSION (REVISION OF POSTERIOR HARDWARE) 1 LEVEL;  Surgeon: Melina Schools, MD;  Location: [place];  Service: Orthopedics;  Laterality: N/A;  procedure #1: start 0923, end 1057.   [name] BACK SURGERY  2010 / 2014  . BLADDER SUSPENSION     x 2  . CARDIAC CATHETERIZATION     no PCI  . CARDIOVASCULAR STRESS TEST  08/16/2008   EF 57%; inferior region has diaphragmatic attenuation, otherwise normal perfusion w/o evidence of ischemia or infarct; LV normal in size; no scintographic evidence of inducible ischemia; no ECG changes, negative for ischemia; low risk scan  . CERVICAL FUSION    . COLON RESECTION N/A 07/12/2013   peritoneal biopsy only, evidence of metastatic prostate cancer  . COLONOSCOPY    . COLONOSCOPY N/A 05/27/2013   RMR: Extrinsic mass effect at the level of the appendiceal orifice likely represents an appendiceal or periappendiceal process. Neovascular changes of the rectal mucosa consistent with prior history of radiation treatment. Multiple 3-5 mm polyps removed, several tubular adenomas. Diverticulosis.nex tcs  2019  . CYSTOSCOPY W/ URETERAL STENT PLACEMENT N/A 10/03/2013   Procedure: CYSTOSCOPY AND REMOVAL OF BLADDER NECK STONE ;  Surgeon: Dutch Gray, MD;  [facility];  Service: Urology;  Laterality: N/A;  WITH REMOVAL OF BLADDER NECK STONE    . ESOPHAGOGASTRODUODENOSCOPY (EGD) WITH ESOPHAGEAL DILATION N/A 05/27/2013   RMR: Mild erosive reflux esophagitis with soft noncritical appearing stricture status post Maloney dilation, antral erosions with  reactive gastropathy but no H. pylori  . HOLMIUM LASER APPLICATION N/A [id number]   Procedure: HOLMIUM LASER APPLICATION;  Surgeon: Dutch Gray, MD;  [facility];  Service: Urology;  Laterality: N/A;  . PROSTATE SURGERY    . TRANSTHORACIC ECHOCARDIOGRAM  08/16/2008   essentially normal for age  . URINARY SPHINCTER IMPLANT N/A 01/31/2014   Procedure: IMPLANTATION ARTIFICIAL SPHINCTER  WITH CYSTOSCOPY;  Surgeon: Reece Packer, MD;  [facility];  Service: Urology;  Laterality: N/A;  . WRIST SURGERY Right     Current Medications: Outpatient Medications Prior to Visit  Medication Sig Dispense Refill  . Calcium Carb-Cholecalciferol (CALCIUM 1000 + D PO) Take 1 tablet by mouth 2 (two) times daily.     . Choline Fenofibrate (TRILIPIX) 135 MG capsule Take 135 mg by mouth daily.    . Coenzyme Q10 (CO Q 10) 100 MG CAPS Take 1 capsule by mouth daily.    . cyanocobalamin (,VITAMIN B-12,) 1000 MCG/ML injection Inject 1 mL into the muscle every 30 (thirty) days.  0  . desipramine (NOPRAMIN) 100 MG tablet Take 1 tablet by mouth at bedtime.  0  . enalapril (VASOTEC) 10 MG tablet Take 10 mg by mouth every morning.     . ferrous sulfate 325 (65 FE) MG tablet Take 1 tablet by mouth daily.  0  . Krill Oil 300 MG CAPS Take 300 mg by mouth daily.    [name] leuprolide (LUPRON) 30 MG injection Inject 30 mg into the muscle every 3 (three) months.    . lubiprostone (AMITIZA) 24 MCG capsule Take 1 capsule (24 mcg total) by mouth 2 (two) times daily with a meal. 180 capsule 3  . megestrol (MEGACE) 20 MG tablet Take 0.5 tablets by mouth 2 (two) times daily.  0  . meloxicam (MOBIC) 7.5 MG tablet Take 1 tablet by mouth as needed.  0  . Multiple Vitamin (MULTIVITAMIN WITH MINERALS) TABS tablet Take 1 tablet by mouth daily.    [name] oxyCODONE-acetaminophen (PERCOCET) 10-325 MG tablet daily as needed.  0  . pantoprazole (PROTONIX) 40 MG tablet Take 1 tablet (40 mg total) by mouth daily. 90 tablet 3  . simvastatin (ZOCOR)  40 MG tablet Take 40 mg by mouth every evening.    . venlafaxine XR (EFFEXOR-XR) 75 MG 24 hr capsule Take 1 capsule by mouth daily.  0   No facility-administered medications prior to visit.      Allergies:   Penicillins   Social History   Social History  . Marital status: Married    Spouse name: N/A  . Number of children: N/A  . Years of education: N/A   Social History Main Topics  . Smoking status: Former Smoker    Packs/day: 0.25    Years: 43.00    Types: Cigarettes  . Smokeless tobacco: Former Systems developer  . Alcohol use No  . Drug use: No  . Sexual activity: Not Asked   Other Topics Concern  . None   Social History Narrative  . None     Family History:  The patient's family history includes Colon polyps in his father; Diabetes in his brother, father, and mother; Heart disease in his mother; Hypertension in his brother, father, and mother.   ROS:   Please see the history of present illness.    ROS All other systems reviewed and are negative.   PHYSICAL EXAM:   VS:  BP 133/84   Pulse 83   Ht 6\' 2"  (1.88 m)   Wt 283 lb (128.4 kg)   BMI 36.34 kg/m    GEN: Well nourished, well developed, in no acute distress  HEENT: normal  Neck: no JVD, carotid bruits, or masses Cardiac: RRR; no murmurs, rubs, or gallops,no edema  Respiratory:  clear to auscultation bilaterally, normal work of breathing GI: soft, nontender, nondistended, + BS MS: no deformity or atrophy  Skin: warm and dry, no rash Neuro:  Alert and Oriented x 3, Strength and sensation are intact Psych: euthymic mood, full affect  Wt Readings from Last 3 Encounters:  04/24/16 283 lb (128.4 kg)  04/02/16 285 lb 3.2 oz (129.4 kg)  10/06/14 281 lb 3.2 oz (127.6 kg)      Studies/Labs Reviewed:   EKG:  EKG is ordered today.  The ekg ordered today demonstrates Sinus rhythm, normal tracing, QTC 437 ms   ASSESSMENT:    1. Essential hypertension   2. COPD (chronic obstructive pulmonary disease) with chronic  bronchitis ([place])   3. Mixed hyperlipidemia   4. Diabetes mellitus type 2 in obese ([facility])   5. Obesity (BMI 30-39.9)      PLAN:  In order of problems listed above:  1. HTN: Well controlled  2. Congratulated on successful smoking cessation, although I'm sure this is contributing to his weight gain 3. HLP: Need to get his most recent lab results from PCP. Target LDL under 100 4. DM: Current hemoglobin A1c level is acceptable, but he needs to do his best to reverse this trend to warts worsening metabolic problems. Strongly advised to pursue weight loss aggressively. Would like him to eliminate simple sugars from his diet and eat starchy carbohydrates very sparingly. Increase intake of protein and unsaturated fat. [place] as a regular basis, even if all he does is go for walk. 5. Obesity: He will likely have worsening metabolic and vascular complications if he does not lose weight.    Medication Adjustments/Labs and Tests Ordered: Current medicines are reviewed at length with the patient today.  Concerns regarding medicines are outlined above.  Medication changes, Labs and Tests ordered today are listed in the Patient Instructions below. Patient Instructions  NO CHANGE WITH CURRENT MEDICATIONS   Your physician wants you to follow-up in: 3 MONTHS WITH DR Ylonda Storr. You will receive a reminder letter in the mail two months in advance. If you don't receive a letter, please call our office to schedule the follow-up appointment.  If you need a refill on your cardiac medications before your next appointment, please call your pharmacy.     Signed, Sanda Klein, MD  04/24/2016 1:37 PM    [facility] [place], [place], [place]  [address] Phone: [phone]; Fax: [phone]

## 2016-05-09 DIAGNOSIS — G894 Chronic pain syndrome: Secondary | ICD-10-CM | POA: Diagnosis not present

## 2016-05-09 DIAGNOSIS — Z981 Arthrodesis status: Secondary | ICD-10-CM | POA: Diagnosis not present

## 2016-05-09 DIAGNOSIS — M961 Postlaminectomy syndrome, not elsewhere classified: Secondary | ICD-10-CM | POA: Diagnosis not present

## 2016-05-09 DIAGNOSIS — Z79891 Long term (current) use of opiate analgesic: Secondary | ICD-10-CM | POA: Diagnosis not present

## 2016-05-23 DIAGNOSIS — G4733 Obstructive sleep apnea (adult) (pediatric): Secondary | ICD-10-CM | POA: Diagnosis not present

## 2016-05-28 DIAGNOSIS — G894 Chronic pain syndrome: Secondary | ICD-10-CM | POA: Diagnosis not present

## 2016-05-28 DIAGNOSIS — C61 Malignant neoplasm of prostate: Secondary | ICD-10-CM | POA: Diagnosis not present

## 2016-05-28 DIAGNOSIS — M961 Postlaminectomy syndrome, not elsewhere classified: Secondary | ICD-10-CM | POA: Diagnosis not present

## 2016-05-28 DIAGNOSIS — M5136 Other intervertebral disc degeneration, lumbar region: Secondary | ICD-10-CM | POA: Diagnosis not present

## 2016-05-28 DIAGNOSIS — Z981 Arthrodesis status: Secondary | ICD-10-CM | POA: Diagnosis not present

## 2016-06-04 DIAGNOSIS — C799 Secondary malignant neoplasm of unspecified site: Secondary | ICD-10-CM | POA: Diagnosis not present

## 2016-06-04 DIAGNOSIS — C61 Malignant neoplasm of prostate: Secondary | ICD-10-CM | POA: Diagnosis not present

## 2016-08-07 DIAGNOSIS — C61 Malignant neoplasm of prostate: Secondary | ICD-10-CM | POA: Diagnosis not present

## 2016-08-07 DIAGNOSIS — H43393 Other vitreous opacities, bilateral: Secondary | ICD-10-CM | POA: Diagnosis not present

## 2016-08-13 DIAGNOSIS — C61 Malignant neoplasm of prostate: Secondary | ICD-10-CM | POA: Diagnosis not present

## 2016-08-13 DIAGNOSIS — N200 Calculus of kidney: Secondary | ICD-10-CM | POA: Diagnosis not present

## 2016-09-11 DIAGNOSIS — G4733 Obstructive sleep apnea (adult) (pediatric): Secondary | ICD-10-CM | POA: Diagnosis not present

## 2016-10-13 DIAGNOSIS — C61 Malignant neoplasm of prostate: Secondary | ICD-10-CM | POA: Diagnosis not present

## 2016-10-17 DIAGNOSIS — C799 Secondary malignant neoplasm of unspecified site: Secondary | ICD-10-CM | POA: Diagnosis not present

## 2016-10-17 DIAGNOSIS — C61 Malignant neoplasm of prostate: Secondary | ICD-10-CM | POA: Diagnosis not present

## 2017-01-09 DIAGNOSIS — C61 Malignant neoplasm of prostate: Secondary | ICD-10-CM | POA: Diagnosis not present

## 2017-01-09 DIAGNOSIS — G4733 Obstructive sleep apnea (adult) (pediatric): Secondary | ICD-10-CM | POA: Diagnosis not present

## 2017-03-30 DIAGNOSIS — J449 Chronic obstructive pulmonary disease, unspecified: Secondary | ICD-10-CM | POA: Diagnosis not present

## 2017-03-30 DIAGNOSIS — M1991 Primary osteoarthritis, unspecified site: Secondary | ICD-10-CM | POA: Diagnosis not present

## 2017-03-30 DIAGNOSIS — I10 Essential (primary) hypertension: Secondary | ICD-10-CM | POA: Diagnosis not present

## 2017-03-30 DIAGNOSIS — Z1389 Encounter for screening for other disorder: Secondary | ICD-10-CM | POA: Diagnosis not present

## 2017-03-30 DIAGNOSIS — Z0001 Encounter for general adult medical examination with abnormal findings: Secondary | ICD-10-CM | POA: Diagnosis not present

## 2017-03-30 DIAGNOSIS — K219 Gastro-esophageal reflux disease without esophagitis: Secondary | ICD-10-CM | POA: Diagnosis not present

## 2017-03-30 DIAGNOSIS — R7309 Other abnormal glucose: Secondary | ICD-10-CM | POA: Diagnosis not present

## 2017-04-05 ENCOUNTER — Other Ambulatory Visit: Payer: Self-pay | Admitting: Gastroenterology

## 2017-05-01 DIAGNOSIS — G4733 Obstructive sleep apnea (adult) (pediatric): Secondary | ICD-10-CM | POA: Diagnosis not present

## 2017-05-08 DIAGNOSIS — C61 Malignant neoplasm of prostate: Secondary | ICD-10-CM | POA: Diagnosis not present

## 2017-05-11 DIAGNOSIS — Z23 Encounter for immunization: Secondary | ICD-10-CM | POA: Diagnosis not present

## 2017-05-19 DIAGNOSIS — R232 Flushing: Secondary | ICD-10-CM | POA: Diagnosis not present

## 2017-05-19 DIAGNOSIS — C61 Malignant neoplasm of prostate: Secondary | ICD-10-CM | POA: Diagnosis not present

## 2017-05-22 ENCOUNTER — Encounter: Payer: Self-pay | Admitting: Cardiovascular Disease

## 2017-05-22 ENCOUNTER — Ambulatory Visit (INDEPENDENT_AMBULATORY_CARE_PROVIDER_SITE_OTHER): Payer: Medicare Other | Admitting: Cardiovascular Disease

## 2017-05-22 VITALS — BP 122/76 | HR 74 | Ht 74.0 in | Wt 281.0 lb

## 2017-05-22 DIAGNOSIS — E1169 Type 2 diabetes mellitus with other specified complication: Secondary | ICD-10-CM | POA: Diagnosis not present

## 2017-05-22 DIAGNOSIS — E782 Mixed hyperlipidemia: Secondary | ICD-10-CM | POA: Diagnosis not present

## 2017-05-22 DIAGNOSIS — I10 Essential (primary) hypertension: Secondary | ICD-10-CM | POA: Diagnosis not present

## 2017-05-22 DIAGNOSIS — E669 Obesity, unspecified: Secondary | ICD-10-CM | POA: Diagnosis not present

## 2017-05-22 NOTE — Patient Instructions (Addendum)
Dr Sallyanne Kuster recommends that you schedule a follow-up appointment in 12 months. You will receive a reminder letter in the mail two months in advance. If you don't receive a letter, please call our office to schedule the follow-up appointment.  Your physician recommends that you schedule a sleep clinic appointment with Dr Claiborne Billings - first available.  If you need a refill on your cardiac medications before your next appointment, please call your pharmacy.   Your physician encouraged you to lose weight for better health.

## 2017-05-22 NOTE — Progress Notes (Signed)
Cardiology Office Note    Date:  05/22/2017   ID:  [name], DOB [birth year], MRN [id number]  PCP:  Redmond School, MD  Cardiologist:   Sanda Klein, MD   Chief Complaint  Patient presents with  . Follow-up    SOB    History of Present Illness:  [name] is a 61 y.o. male with obesity, hypertension, hyperlipidemia, COPD, type 2 diabetes mellitus and widespread metastastic prostate cancer with good response to anti-androgen therapy.  His biggest complaints are of fatigue, severe hot flashes and low back pain. His wife states that he snores heavily, even though he wears CPAP. They have been trying to get a CPAP titration study scheduled unsuccessfully. He denies angina. Low back and joint pain limits him more than shortness of breath. He does not have chest discomfort and denies palpitations, syncope, dizziness, leg edema or claudication. He is not smoking.  He is noted to have normal coronary arteries by previous angiography in 2011.  Past Medical History:  Diagnosis Date  . Anxiety   . Arthritis   . Bladder stone   . Cancer [facility]) 2007   prostate CA  . Colon cancer ([place])   . COPD (chronic obstructive pulmonary disease) ([place])   . Decreased pulse 08/16/2008   LE doppler - bilateral ABIs normal, bilateral PVRs normal waveform; bilateral common iliac arteries difficult to visualize however velocities suggest less than 50% diameter reduction; bilateral LE normal velocities w/o evidence of diameter reduction  . Depression   . GERD (gastroesophageal reflux disease)   . Hypercholesteremia   . Hypertension   . Mental disorder    PTSD  . Shortness of breath   . Sleep apnea    wears CPAP nightly, settings at 14   . Tobacco abuse   . Urinary incontinence    due to prostate removal    Past Surgical History:  Procedure Laterality Date  . ANTERIOR LAT LUMBAR FUSION N/A 11/17/2012   Procedure: XLIF L3 - L4 POSTERIOR L3 -L4 FUSION INSTRUMENTATION/L2 - L3 DECOMPRESSION  (REVISION OF POSTERIOR HARDWARE) 1 LEVEL;  Surgeon: Melina Schools, MD;  Location: [place];  Service: Orthopedics;  Laterality: N/A;  procedure #1: start 0923, end 1057.   [name] BACK SURGERY  2010 / 2014  . BLADDER SUSPENSION     x 2  . CARDIAC CATHETERIZATION     no PCI  . CARDIOVASCULAR STRESS TEST  08/16/2008   EF 57%; inferior region has diaphragmatic attenuation, otherwise normal perfusion w/o evidence of ischemia or infarct; LV normal in size; no scintographic evidence of inducible ischemia; no ECG changes, negative for ischemia; low risk scan  . CERVICAL FUSION    . COLON RESECTION N/A 07/12/2013   peritoneal biopsy only, evidence of metastatic prostate cancer  . COLONOSCOPY    . COLONOSCOPY N/A 05/27/2013   RMR: Extrinsic mass effect at the level of the appendiceal orifice likely represents an appendiceal or periappendiceal process. Neovascular changes of the rectal mucosa consistent with prior history of radiation treatment. Multiple 3-5 mm polyps removed, several tubular adenomas. Diverticulosis.nex tcs 2019  . CYSTOSCOPY W/ URETERAL STENT PLACEMENT N/A 10/03/2013   Procedure: CYSTOSCOPY AND REMOVAL OF BLADDER NECK STONE ;  Surgeon: Dutch Gray, MD;  [facility];  Service: Urology;  Laterality: N/A;  WITH REMOVAL OF BLADDER NECK STONE    . ESOPHAGOGASTRODUODENOSCOPY (EGD) WITH ESOPHAGEAL DILATION N/A 05/27/2013   RMR: Mild erosive reflux esophagitis with soft noncritical appearing stricture status post Maloney dilation, antral  erosions with reactive gastropathy but no H. pylori  . HOLMIUM LASER APPLICATION N/A 04/27/8100   Procedure: HOLMIUM LASER APPLICATION;  Surgeon: Dutch Gray, MD;  [facility];  Service: Urology;  Laterality: N/A;  . PROSTATE SURGERY    . TRANSTHORACIC ECHOCARDIOGRAM  08/16/2008   essentially normal for age  . URINARY SPHINCTER IMPLANT N/A 01/31/2014   Procedure: IMPLANTATION ARTIFICIAL SPHINCTER  WITH CYSTOSCOPY;  Surgeon: Reece Packer, MD;  [facility];  Service: Urology;  Laterality: N/A;  . WRIST SURGERY Right     Current Medications: Outpatient Medications Prior to Visit  Medication Sig Dispense Refill  . Calcium Carb-Cholecalciferol (CALCIUM 1000 + D PO) Take 1 tablet by mouth 2 (two) times daily.     . Choline Fenofibrate (TRILIPIX) 135 MG capsule Take 135 mg by mouth daily.    . Coenzyme Q10 (CO Q 10) 100 MG CAPS Take 1 capsule by mouth daily.    [name] desipramine (NOPRAMIN) 100 MG tablet Take 1 tablet by mouth at bedtime.  0  . enalapril (VASOTEC) 10 MG tablet Take 10 mg by mouth every morning.     . ferrous sulfate 325 (65 FE) MG tablet Take 1 tablet by mouth daily.  0  . Krill Oil 300 MG CAPS Take 300 mg by mouth daily.    [name] leuprolide (LUPRON) 30 MG injection Inject 30 mg into the muscle every 3 (three) months.    . lubiprostone (AMITIZA) 24 MCG capsule Take 1 capsule (24 mcg total) by mouth 2 (two) times daily with a meal. 180 capsule 3  . megestrol (MEGACE) 20 MG tablet Take 0.5 tablets by mouth 2 (two) times daily.  0  . meloxicam (MOBIC) 7.5 MG tablet Take 1 tablet by mouth as needed.  0  . Multiple Vitamin (MULTIVITAMIN WITH MINERALS) TABS tablet Take 1 tablet by mouth daily.    . pantoprazole (PROTONIX) 40 MG tablet TAKE 1 TABLET BY MOUTH  DAILY 90 tablet 3  . simvastatin (ZOCOR) 40 MG tablet Take 40 mg by mouth every evening.    . venlafaxine XR (EFFEXOR-XR) 75 MG 24 hr capsule Take 1 capsule by mouth daily.  0  . cyanocobalamin (,VITAMIN B-12,) 1000 MCG/ML injection Inject 1 mL into the muscle every 30 (thirty) days.  0  . oxyCODONE-acetaminophen (PERCOCET) 10-325 MG tablet daily as needed.  0   No facility-administered medications prior to visit.      Allergies:   Penicillins   Social History   Social History  . Marital status: Married    Spouse name: N/A  . Number of children: N/A  . Years of education: N/A   Social History Main Topics  . Smoking status: Former Smoker    Packs/day: 0.25    Years: 43.00      Types: Cigarettes  . Smokeless tobacco: Former Systems developer  . Alcohol use No  . Drug use: No  . Sexual activity: Not Asked   Other Topics Concern  . None   Social History Narrative  . None     Family History:  The patient's family history includes Colon polyps in his father; Diabetes in his brother, father, and mother; Heart disease in his mother; Hypertension in his brother, father, and mother.   ROS:   Please see the history of present illness.    ROS All other systems reviewed and are negative.   PHYSICAL EXAM:   VS:  BP 122/76   Pulse 74   Ht 6\' 2"  (1.88  m)   Wt 281 lb (127.5 kg)   BMI 36.08 kg/m     General: Alert, oriented x3, no distress, Morbidly obese Head: no evidence of trauma, PERRL, EOMI, no exophtalmos or lid lag, no myxedema, no xanthelasma; normal ears, nose and oropharynx Neck: normal jugular venous pulsations and no hepatojugular reflux; brisk carotid pulses without delay and no carotid bruits Chest: clear to auscultation, no signs of consolidation by percussion or palpation, normal fremitus, symmetrical and full respiratory excursions Cardiovascular: normal position and quality of the apical impulse, regular rhythm, normal first and second heart sounds, no murmurs, rubs or gallops Abdomen: no tenderness or distention, no masses by palpation, no abnormal pulsatility or arterial bruits, normal bowel sounds, no hepatosplenomegaly Extremities: no clubbing, cyanosis or edema; 2+ radial, ulnar and brachial pulses bilaterally; 2+ right femoral, posterior tibial and dorsalis pedis pulses; 2+ left femoral, posterior tibial and dorsalis pedis pulses; no subclavian or femoral bruits Neurological: grossly nonfocal Psych: Normal mood and affect   Wt Readings from Last 3 Encounters:  05/22/17 281 lb (127.5 kg)  04/24/16 283 lb (128.4 kg)  04/02/16 285 lb 3.2 oz (129.4 kg)      Studies/Labs Reviewed:   EKG:  EKG is ordered today.  The ekg ordered today demonstrates  Sinus rhythm, normal tracing. QTC 425 ms  LABS: 03/30/2017, [facility] Total cholesterol 177, HDL 36, LDL 96, triglyceride 223 Hemoglobin A1c 6.5% Creatinine 0.92, hemoglobin 13, potassium 4.6  ASSESSMENT:    1. Essential hypertension   2. Mixed hyperlipidemia   3. Diabetes mellitus type 2 in obese ([place])   4. Severe obesity with body mass index (BMI) of 35.0 to 39.9 with serious comorbidity ([place])      PLAN:  In order of problems listed above:  1. HTN: Good control 2. HLP: LDL at target under 100. However HDL remains low and triglycerides are slightly high, which fits well with his ventral obesity, diabetes mellitus, in a pattern of metabolic syndrome/insulin resistance. Discussed importance of exercise, although he is limited by his back problems. Limited intake of carbohydrates, especially sugars and starches with high glycemic index. 3. DM: Although his A1c level is acceptable, he clearly has a hyperinsulinemic state and is at risk for progression of atherosclerosis in other vascular complications. The only way to reverse this will be more exercise and weight loss.  4. Obesity: He will likely have worsening metabolic and vascular complications if he does not lose weight.    Medication Adjustments/Labs and Tests Ordered: Current medicines are reviewed at length with the patient today.  Concerns regarding medicines are outlined above.  Medication changes, Labs and Tests ordered today are listed in the Patient Instructions below. Patient Instructions  Dr Sallyanne Kuster recommends that you schedule a follow-up appointment in 12 months. You will receive a reminder letter in the mail two months in advance. If you don't receive a letter, please call our office to schedule the follow-up appointment.  Your physician recommends that you schedule a sleep clinic appointment with Dr Claiborne Billings - first available.  If you need a refill on your cardiac medications before your next appointment, please  call your pharmacy.   Your physician encouraged you to lose weight for better health.    Signed, Sanda Klein, MD  05/22/2017 6:03 PM    [facility] [place], [place], [place]  [address] Phone: [phone]; Fax: [phone]

## 2017-07-23 ENCOUNTER — Ambulatory Visit: Payer: Self-pay | Admitting: Cardiovascular Disease

## 2017-08-05 DIAGNOSIS — G4733 Obstructive sleep apnea (adult) (pediatric): Secondary | ICD-10-CM | POA: Diagnosis not present

## 2017-08-13 DIAGNOSIS — E119 Type 2 diabetes mellitus without complications: Secondary | ICD-10-CM | POA: Diagnosis not present

## 2017-08-14 DIAGNOSIS — C61 Malignant neoplasm of prostate: Secondary | ICD-10-CM | POA: Diagnosis not present

## 2017-09-09 DIAGNOSIS — N200 Calculus of kidney: Secondary | ICD-10-CM | POA: Diagnosis not present

## 2017-09-23 ENCOUNTER — Encounter: Payer: Self-pay | Admitting: Cardiovascular Disease

## 2017-09-23 ENCOUNTER — Ambulatory Visit: Payer: Medicare Other | Admitting: Cardiovascular Disease

## 2017-09-23 VITALS — BP 130/79 | HR 80 | Ht 74.0 in | Wt 267.6 lb

## 2017-09-23 DIAGNOSIS — G4733 Obstructive sleep apnea (adult) (pediatric): Secondary | ICD-10-CM | POA: Diagnosis not present

## 2017-09-23 DIAGNOSIS — I10 Essential (primary) hypertension: Secondary | ICD-10-CM

## 2017-09-23 DIAGNOSIS — E782 Mixed hyperlipidemia: Secondary | ICD-10-CM | POA: Diagnosis not present

## 2017-09-23 DIAGNOSIS — C61 Malignant neoplasm of prostate: Secondary | ICD-10-CM

## 2017-09-23 DIAGNOSIS — E669 Obesity, unspecified: Secondary | ICD-10-CM | POA: Diagnosis not present

## 2017-09-23 DIAGNOSIS — R0683 Snoring: Secondary | ICD-10-CM | POA: Diagnosis not present

## 2017-09-23 DIAGNOSIS — E1169 Type 2 diabetes mellitus with other specified complication: Secondary | ICD-10-CM | POA: Diagnosis not present

## 2017-09-23 DIAGNOSIS — Z9989 Dependence on other enabling machines and devices: Secondary | ICD-10-CM | POA: Diagnosis not present

## 2017-09-23 NOTE — Patient Instructions (Addendum)
Medication Instructions:  Your physician recommends that you continue on your current medications as directed. Please refer to the Current Medication list given to you today.  Testing/Procedures: Your physician has recommended that you have a sleep study. This test records several body functions during sleep, including: brain activity, eye movement, oxygen and carbon dioxide blood levels, heart rate and rhythm, breathing rate and rhythm, the flow of air through your mouth and nose, snoring, body muscle movements, and chest and belly movement. --this must be pre-approved by insurance prior to scheduling.  Once approved, someone from our office will call to schedule.   Follow-Up: Your physician recommends that you schedule a follow-up appointment in: 4 months with Dr. Claiborne Billings (sleep clinic)    If you need a refill on your cardiac medications before your next appointment, please call your pharmacy.

## 2017-09-25 ENCOUNTER — Encounter: Payer: Self-pay | Admitting: Cardiovascular Disease

## 2017-09-25 NOTE — Progress Notes (Signed)
Cardiology Office Note    Date:  09/25/2017   ID:  [name], DOB [birth year], MRN [id number]  PCP:  Redmond School, MD  Cardiologist:  Shelva Majestic, MD (sleep); Dr. Sallyanne Kuster  New sleep evaluation  History of Present Illness:  [name] is a 62 y.o. male who is referred by Dr. Sallyanne Kuster for sleep evaluation of his obstructive sleep apnea.  Mr. Chip Boer has a history of hypertension, hyperlipidemia, COPD, type 2 diabetes mellitus, obesity, as well as metastatic prostate CA on anti-antigen therapy.  He has a history of obstructive sleep apnea that dates back to 2012.  He has an old [employer] unit which is an auto unit which is set at 4 -20.  He brought his machine with him to the office today.  He has not been followed by a sleep physician only saw Dr. Chancy Milroy once after initiation of treatment.  In 2012.  Recently, he has not been sleeping well.  He is not getting adequate pressures.  The machine tends to cut off at times.  A download reveals a 30 day sleep average of 8 hours and 30 minutes.  Days greater than 4 hours were 29 out of 30.  AHI was 3.0.  He has noticed breakthrough snoring.  He wakes up at least 3 times per night for nocturia.  He is unaware of restless legs.  He denies bruxism.  An Epworth Sleepiness Scale score was calculated in the office today and this endorsed at 7 with a slight chance of dozing while sitting and reading, as a passenger in a car for an hour without a break, and while sitting quietly after lunch.  There is a moderate chance of dozing while watching television, and lying down to rest in the afternoon. [name]  He presents for evaluation.   Past Medical History:  Diagnosis Date  . Anxiety   . Arthritis   . Bladder stone   . Cancer [facility]) 2007   prostate CA  . Colon cancer ([place])   . COPD (chronic obstructive pulmonary disease) ([place])   . Decreased pulse 08/16/2008   LE doppler - bilateral ABIs normal, bilateral PVRs normal waveform; bilateral common iliac  arteries difficult to visualize however velocities suggest less than 50% diameter reduction; bilateral LE normal velocities w/o evidence of diameter reduction  . Depression   . GERD (gastroesophageal reflux disease)   . Hypercholesteremia   . Hypertension   . Mental disorder    PTSD  . Shortness of breath   . Sleep apnea    wears CPAP nightly, settings at 14   . Tobacco abuse   . Urinary incontinence    due to prostate removal    Past Surgical History:  Procedure Laterality Date  . ANTERIOR LAT LUMBAR FUSION N/A 11/17/2012   Procedure: XLIF L3 - L4 POSTERIOR L3 -L4 FUSION INSTRUMENTATION/L2 - L3 DECOMPRESSION (REVISION OF POSTERIOR HARDWARE) 1 LEVEL;  Surgeon: Melina Schools, MD;  Location: [place];  Service: Orthopedics;  Laterality: N/A;  procedure #1: start 0923, end 1057.   [name] BACK SURGERY  2010 / 2014  . BLADDER SUSPENSION     x 2  . CARDIAC CATHETERIZATION     no PCI  . CARDIOVASCULAR STRESS TEST  08/16/2008   EF 57%; inferior region has diaphragmatic attenuation, otherwise normal perfusion w/o evidence of ischemia or infarct; LV normal in size; no scintographic evidence of inducible ischemia; no ECG changes, negative for ischemia; low risk scan  . CERVICAL FUSION    .  COLON RESECTION N/A 07/12/2013   peritoneal biopsy only, evidence of metastatic prostate cancer  . COLONOSCOPY    . COLONOSCOPY N/A 05/27/2013   RMR: Extrinsic mass effect at the level of the appendiceal orifice likely represents an appendiceal or periappendiceal process. Neovascular changes of the rectal mucosa consistent with prior history of radiation treatment. Multiple 3-5 mm polyps removed, several tubular adenomas. Diverticulosis.nex tcs 2019  . CYSTOSCOPY W/ URETERAL STENT PLACEMENT N/A 10/03/2013   Procedure: CYSTOSCOPY AND REMOVAL OF BLADDER NECK STONE ;  Surgeon: Dutch Gray, MD;  Location: [facility];  Service: Urology;  Laterality: N/A;  WITH REMOVAL OF BLADDER NECK STONE    . ESOPHAGOGASTRODUODENOSCOPY (EGD)  WITH ESOPHAGEAL DILATION N/A 05/27/2013   RMR: Mild erosive reflux esophagitis with soft noncritical appearing stricture status post Maloney dilation, antral erosions with reactive gastropathy but no H. pylori  . HOLMIUM LASER APPLICATION N/A 01/30/1274   Procedure: HOLMIUM LASER APPLICATION;  Surgeon: Dutch Gray, MD;  Location: [facility];  Service: Urology;  Laterality: N/A;  . PROSTATE SURGERY    . TRANSTHORACIC ECHOCARDIOGRAM  08/16/2008   essentially normal for age  . URINARY SPHINCTER IMPLANT N/A 01/31/2014   Procedure: IMPLANTATION ARTIFICIAL SPHINCTER  WITH CYSTOSCOPY;  Surgeon: Reece Packer, MD;  Location: [facility];  Service: Urology;  Laterality: N/A;  . WRIST SURGERY Right     Current Medications: Outpatient Medications Prior to Visit  Medication Sig Dispense Refill  . Calcium Carb-Cholecalciferol (CALCIUM 1000 + D PO) Take 1 tablet by mouth 2 (two) times daily.     . Choline Fenofibrate (TRILIPIX) 135 MG capsule Take 135 mg by mouth daily.    . Coenzyme Q10 (CO Q 10) 100 MG CAPS Take 1 capsule by mouth daily.    [name] desipramine (NOPRAMIN) 100 MG tablet Take 1 tablet by mouth at bedtime.  0  . enalapril (VASOTEC) 10 MG tablet Take 10 mg by mouth every morning.     . ferrous sulfate 325 (65 FE) MG tablet Take 1 tablet by mouth daily.  0  . Krill Oil 300 MG CAPS Take 300 mg by mouth daily.    [name] leuprolide (LUPRON) 30 MG injection Inject 30 mg into the muscle every 3 (three) months.    . megestrol (MEGACE) 20 MG tablet Take 0.5 tablets by mouth 2 (two) times daily.  0  . meloxicam (MOBIC) 7.5 MG tablet Take 1 tablet by mouth as needed.  0  . Multiple Vitamin (MULTIVITAMIN WITH MINERALS) TABS tablet Take 1 tablet by mouth daily.    . pantoprazole (PROTONIX) 40 MG tablet TAKE 1 TABLET BY MOUTH  DAILY 90 tablet 3  . simvastatin (ZOCOR) 40 MG tablet Take 40 mg by mouth every evening.    . venlafaxine XR (EFFEXOR-XR) 75 MG 24 hr capsule Take 1 capsule by mouth daily.  0  . lubiprostone  (AMITIZA) 24 MCG capsule Take 1 capsule (24 mcg total) by mouth 2 (two) times daily with a meal. 180 capsule 3   No facility-administered medications prior to visit.      Allergies:   Penicillins   Social History   Socioeconomic History  . Marital status: Married    Spouse name: None  . Number of children: None  . Years of education: None  . Highest education level: None  Social Needs  . Financial resource strain: None  . Food insecurity - worry: None  . Food insecurity - inability: None  . Transportation needs - medical: None  . Transportation  needs - non-medical: None  Occupational History  . None  Tobacco Use  . Smoking status: Former Smoker    Packs/day: 0.25    Years: 43.00    Pack years: 10.75    Types: Cigarettes  . Smokeless tobacco: Former Network engineer and Sexual Activity  . Alcohol use: No  . Drug use: No  . Sexual activity: None  Other Topics Concern  . None  Social History Narrative  . None     Family History:  The patient's family history includes Colon polyps in his father; Diabetes in his brother, father, and mother; Heart disease in his mother; Hypertension in his brother, father, and mother.  His father is 15 and had CABG.  Mother died at with a myocardial infarction.  He has 3 brothers and 1 sister.  Siblings have diabetes mellitus in his sister had undergone CABG surgery.  ROS General: Negative; No fevers, chills, or night sweats;  HEENT: Negative; No changes in vision or hearing, sinus congestion, difficulty swallowing Pulmonary: Negative; No cough, wheezing, shortness of breath, hemoptysis Cardiovascular: Negative; No chest pain, presyncope, syncope, palpitations GI: Negative; No nausea, vomiting, diarrhea, or abdominal pain GU: Negative; No dysuria, hematuria, or difficulty voiding Musculoskeletal: Negative; no myalgias, joint pain, or weakness Hematologic/Oncology: Negative; no easy bruising, bleeding Endocrine: Negative; no heat/cold  intolerance; no diabetes Neuro: Negative; no changes in balance, headaches Skin: Negative; No rashes or skin lesions Psychiatric: Negative; No behavioral problems, depression Sleep: Positive for OSA with residual snoring, no bruxism, restless legs, hypnogognic hallucinations, no cataplexy Other comprehensive 14 point system review is negative.   PHYSICAL EXAM:   VS:  BP 130/79   Pulse 80   Ht '6\' 2"'  (1.88 m)   Wt 267 lb 9.6 oz (121.4 kg)   SpO2 95%   BMI 34.36 kg/m     Repeat blood pressure was 132/80 Wt Readings from Last 3 Encounters:  09/23/17 267 lb 9.6 oz (121.4 kg)  05/22/17 281 lb (127.5 kg)  04/24/16 283 lb (128.4 kg)    General: Alert, oriented, no distress.  Skin: normal turgor, no rashes, warm and dry HEENT: Normocephalic, atraumatic. Pupils equal round and reactive to light; sclera anicteric; extraocular muscles intact; Fundi no hemorrhages or exudates Nose without nasal septal hypertrophy Mouth/Parynx benign; Mallinpatti scale 3 Neck: No JVD, no carotid bruits; normal carotid upstroke Lungs: clear to ausculatation and percussion; no wheezing or rales Chest wall: without tenderness to palpitation Heart: PMI not displaced, RRR, s1 s2 normal, 1/6 systolic murmur, no diastolic murmur, no rubs, gallops, thrills, or heaves Abdomen: soft, nontender; no hepatosplenomehaly, BS+; abdominal aorta nontender and not dilated by palpation. Back: no CVA tenderness Pulses 2+ Musculoskeletal: full range of motion, normal strength, no joint deformities Extremities: no clubbing cyanosis or edema, Homan's sign negative  Neurologic: grossly nonfocal; Cranial nerves grossly wnl Psychologic: Normal mood and affect   Studies/Labs Reviewed:   EKG:  EKG is not ordered today.    Recent Labs: BMP Latest Ref Rng & Units 02/01/2014 01/24/2014 09/29/2013  Glucose 70 - 99 mg/dL 137(H) 133(H) 109(H)  BUN 6 - 23 mg/dL '8 12 17  ' Creatinine 0.50 - 1.35 mg/dL 0.88 0.90 0.91  Sodium 137 - 147 mEq/L  140 140 138  Potassium 3.7 - 5.3 mEq/L 4.2 4.3 5.0  Chloride 96 - 112 mEq/L 106 103 102  CO2 19 - 32 mEq/L '24 24 26  ' Calcium 8.4 - 10.5 mg/dL 9.0 9.8 9.9     Hepatic Function Latest Ref  Rng & Units 07/06/2013  Total Protein 6.0 - 8.3 g/dL 7.3  Albumin 3.5 - 5.2 g/dL 4.1  AST 0 - 37 U/L 47(H)  ALT 0 - 53 U/L 18  Alk Phosphatase 39 - 117 U/L 62  Total Bilirubin 0.3 - 1.2 mg/dL 0.2(L)    CBC Latest Ref Rng & Units 02/01/2014 01/31/2014 01/24/2014  WBC 4.0 - 10.5 K/uL - - 6.9  Hemoglobin 13.0 - 17.0 g/dL 10.5(L) 11.2(L) 12.2(L)  Hematocrit 39.0 - 52.0 % 31.2(L) 33.6(L) 36.9(L)  Platelets 150 - 400 K/uL - - 340   Lab Results  Component Value Date   MCV 95.3 01/24/2014   MCV 92.8 09/29/2013   MCV 95.5 07/12/2013   No results found for: TSH No results found for: HGBA1C   BNP No results found for: BNP  ProBNP No results found for: PROBNP   Lipid Panel  No results found for: CHOL, TRIG, HDL, CHOLHDL, VLDL, LDLCALC, LDLDIRECT   RADIOLOGY: No results found.   Additional studies/ records that were reviewed today include:  I reviewed the records from Dr. Sallyanne Kuster.  I  Interrogated his CPAP machine for date and compliance.    ASSESSMENT:    1. OSA on CPAP   2. Essential hypertension   3. Obesity (BMI 30-39.9)   4. Snoring   5. Diabetes mellitus type 2 in obese ([place])   6. Prostate CA ([place])   7. Mixed hyperlipidemia     PLAN:  Mr. Schools is a 62 year old gentleman who has a history of hypertension, hyperlipidemia, COPD, diabetes mellitus, as well as obesity.  He is on anti-antigen therapy for metastatic prostate cancer.  He has obstructive sleep apnea originally diagnosed in 2012 and has been on CPAP therapy since that time.  Presently, he feels that he may not be getting adequate pressure.  He is symptomatic with breakthrough snoring and is waking up at least 3 times per night for nocturia.  His machine has been "cutting off "at times and has begun to malfunction.  He  qualifies for new machine.  In the meantime, I'm changing his settings to a CPAP range of 10-20.  I'm scheduling him for a split-night study for complete reevaluation of the severity of his sleep apnea, which would be scheduled at the [place] lab.  I will see him within 3 months following his new set up date per Medicare guidelines for compliance and additional evaluation.  He is on enalapril for hypertension.  He is on combination therapy with Trileptal and simvastatin for mixed hyperlipidemia.  He is on Lupron for his prostate CA.   Medication Adjustments/Labs and Tests Ordered: Current medicines are reviewed at length with the patient today.  Concerns regarding medicines are outlined above.  Medication changes, Labs and Tests ordered today are listed in the Patient Instructions below. Patient Instructions  Medication Instructions:  Your physician recommends that you continue on your current medications as directed. Please refer to the Current Medication list given to you today.  Testing/Procedures: Your physician has recommended that you have a sleep study. This test records several body functions during sleep, including: brain activity, eye movement, oxygen and carbon dioxide blood levels, heart rate and rhythm, breathing rate and rhythm, the flow of air through your mouth and nose, snoring, body muscle movements, and chest and belly movement. --this must be pre-approved by insurance prior to scheduling.  Once approved, someone from our office will call to schedule.   Follow-Up: Your physician recommends that you schedule a  follow-up appointment in: 4 months with Dr. Claiborne Billings (sleep clinic)    If you need a refill on your cardiac medications before your next appointment, please call your pharmacy.      Signed, Shelva Majestic, MD  09/25/2017 6:51 PM    [facility] [address], [place], [place], [place]  [address] Phone: [phone]

## 2017-10-06 ENCOUNTER — Ambulatory Visit: Payer: Medicare Other | Attending: Cardiovascular Disease | Admitting: Cardiovascular Disease

## 2017-10-06 DIAGNOSIS — G4736 Sleep related hypoventilation in conditions classified elsewhere: Secondary | ICD-10-CM | POA: Insufficient documentation

## 2017-10-06 DIAGNOSIS — Z9989 Dependence on other enabling machines and devices: Secondary | ICD-10-CM | POA: Diagnosis not present

## 2017-10-06 DIAGNOSIS — R0683 Snoring: Secondary | ICD-10-CM | POA: Diagnosis not present

## 2017-10-06 DIAGNOSIS — G4761 Periodic limb movement disorder: Secondary | ICD-10-CM | POA: Diagnosis not present

## 2017-10-06 DIAGNOSIS — E669 Obesity, unspecified: Secondary | ICD-10-CM

## 2017-10-06 DIAGNOSIS — I10 Essential (primary) hypertension: Secondary | ICD-10-CM | POA: Insufficient documentation

## 2017-10-06 DIAGNOSIS — G4733 Obstructive sleep apnea (adult) (pediatric): Secondary | ICD-10-CM | POA: Diagnosis not present

## 2017-10-18 ENCOUNTER — Encounter: Payer: Self-pay | Admitting: Cardiovascular Disease

## 2017-10-18 NOTE — Procedures (Signed)
[facility]        Patient [name], [name] Date: 10/06/2017 Gender: Male D.O.B: 1956-01-15 Age (years): 1 Referring Provider: Shelva Majestic MD, ABSM Height (inches): 74 Interpreting Physician: Shelva Majestic MD, ABSM Weight (lbs): 270 RPSGT: Rosebud Poles BMI: 35 MRN: [id number] Neck Size: 18.00  CLINICAL INFORMATION Sleep Study Type: NPSG  Indication for sleep study: Excessive Daytime Sleepiness, OSA documented since 2012 on CPAP, Snoring;    Epworth Sleepiness Score: 18  SLEEP STUDY TECHNIQUE As per the AASM Manual for the Scoring of Sleep and Associated Events v2.3 (April 2016) with a hypopnea requiring 4% desaturations.  The channels recorded and monitored were frontal, central and occipital EEG, electrooculogram (EOG), submentalis EMG (chin), nasal and oral airflow, thoracic and abdominal wall motion, anterior tibialis EMG, snore microphone, electrocardiogram, and pulse oximetry.  MEDICATIONS     Calcium Carb-Cholecalciferol (CALCIUM 1000 + D PO)             Choline Fenofibrate (TRILIPIX) 135 MG capsule         Coenzyme Q10 (CO Q 10) 100 MG CAPS         desipramine (NOPRAMIN) 100 MG tablet         enalapril (VASOTEC) 10 MG tablet         ferrous sulfate 325 (65 FE) MG tablet         Krill Oil 300 MG CAPS         leuprolide (LUPRON) 30 MG injection         megestrol (MEGACE) 20 MG tablet         meloxicam (MOBIC) 7.5 MG tablet         Multiple Vitamin (MULTIVITAMIN WITH MINERALS) TABS tablet         pantoprazole (PROTONIX) 40 MG tablet         simvastatin (ZOCOR) 40 MG tablet         venlafaxine XR (EFFEXOR-XR) 75 MG 24 hr capsule      Medications self-administered by patient taken the night of the study : N/A  SLEEP ARCHITECTURE The study was initiated at 10:40:57 PM and ended at 5:30:04 AM.  Sleep onset time was 46.2 minutes and the sleep efficiency was 65.1%. The total sleep time was 266.4  minutes. Wake after sleep onset (WASO) was 96.5 minutes  Stage REM latency was 326.5 minutes.  The patient spent 21.02% of the night in stage N1 sleep, 73.72% in stage N2 sleep, 0.00% in stage N3 and 5.26% in REM.  Alpha intrusion was absent.  Supine sleep was 2.82%.  RESPIRATORY PARAMETERS The overall apnea/hypopnea index (AHI) was 8.3 per hour.  The respiratory disturbance index processes RDI) was 14.9 per hour. There were 0 total apneas, including 0 obstructive, 0 central and 0 mixed apneas. There were 37 hypopneas and 29 RERAs.  The AHI during Stage REM sleep was 51.4 per hour.  AHI while supine was 0.0 per hour.  The mean oxygen saturation was 91.00%. The minimum SpO2 during sleep was 81.00%.  Moderate snoring was noted during this study.  CARDIAC DATA The 2 lead EKG demonstrated sinus rhythm. The mean heart rate was 75.07 beats per minute. Other EKG findings include: None.  LEG MOVEMENT DATA The total PLMS were 232 with a resulting PLMS index of 52.26. Associated arousal with leg movement  index was 8.1 .  IMPRESSIONS - Mild obstructive sleep apnea overall (AHI 8.3/h; RDI 14.9/h); however, sleep apnea was severe during REM sleep with an AHI of 51.4/h. - No significant central sleep apnea occurred during this study (CAI = 0.0/h). - Moderate oxygen desaturation to a nadir of81% - The patient snored with moderate snoring volume. - No cardiac abnormalities were noted during this study. - Severe periodic limb movements of sleep with a PLMS index of 52.26.   Associated arousals were significant.  DIAGNOSIS - Obstructive Sleep Apnea (327.23 [G47.33 ICD-10]) - Nocturnal Hypoxemia (327.26 [G47.36 ICD-10]) - Periodic limb movement disorder of sleep  RECOMMENDATIONS - The patient did not meet split night protocol; therapeutic CPAP titration is indicated to determine optimal pressure required to alleviate sleep disordered breathing. - Effort should be made to optimize nasal and  oral pharyngeal patency. - if patient is symptomatic with restless legs, recommend a trial of pharmacotherapy with his significantly increased PLMS index and associated arousals. - Avoid alcohol, sedatives and other CNS depressants that may worsen sleep apnea and disrupt normal sleep architecture. - Sleep hygiene should be reviewed to assess factors that may improve sleep quality. - Weight management and regular exercise should be initiated or continued   [Electronically signed] 10/18/2017 10:20 AM  Shelva Majestic MD, [facility], [place], American Board of Sleep Medicine   NPI: [id number] [place] PH: [phone]   FX: [phone] [place]

## 2017-10-18 NOTE — Procedures (Deleted)
    [name] DATE OF BIRTH:  28-Dec-1955 MEDICAL RECORD NUMBER [id number]  LOCATION: [facility]  PHYSICIAN: Shelva Majestic  DATE OF STUDY: 10/06/2017  SLEEP STUDY TYPE: Out of Center Sleep Test                REFERRING PHYSICIAN: Troy Sine, MD  INDICATION FOR STUDY: ***  EPWORTH SLEEPINESS SCORE:   HEIGHT:    WEIGHT:      There is no height or weight on file to calculate BMI.  NECK SIZE:   in.  MEDICATIONS: ***  IMPRESSION:  ***    RECOMMENDATION:  ***   [place], American Board of Sleep Medicine  ELECTRONICALLY SIGNED ON:  10/18/2017, 10:13 AM [facility] PH: [phone]   FX: [phone] [place]

## 2017-10-22 ENCOUNTER — Other Ambulatory Visit: Payer: Self-pay | Admitting: Cardiovascular Disease

## 2017-10-22 DIAGNOSIS — G4733 Obstructive sleep apnea (adult) (pediatric): Secondary | ICD-10-CM

## 2017-10-22 NOTE — Progress Notes (Signed)
10/22/17  Patient's wife notified of results and recommendations. ( husband was in the background aware wife speaking with me.)

## 2017-11-01 ENCOUNTER — Ambulatory Visit: Payer: Medicare Other | Attending: Cardiovascular Disease | Admitting: Cardiovascular Disease

## 2017-11-01 DIAGNOSIS — Z791 Long term (current) use of non-steroidal anti-inflammatories (NSAID): Secondary | ICD-10-CM | POA: Insufficient documentation

## 2017-11-01 DIAGNOSIS — Z79899 Other long term (current) drug therapy: Secondary | ICD-10-CM | POA: Diagnosis not present

## 2017-11-01 DIAGNOSIS — G4733 Obstructive sleep apnea (adult) (pediatric): Secondary | ICD-10-CM | POA: Diagnosis not present

## 2017-11-11 DIAGNOSIS — C100 Malignant neoplasm of vallecula: Secondary | ICD-10-CM | POA: Diagnosis not present

## 2017-11-18 DIAGNOSIS — C799 Secondary malignant neoplasm of unspecified site: Secondary | ICD-10-CM | POA: Diagnosis not present

## 2017-11-18 DIAGNOSIS — C61 Malignant neoplasm of prostate: Secondary | ICD-10-CM | POA: Diagnosis not present

## 2017-11-18 DIAGNOSIS — Z5111 Encounter for antineoplastic chemotherapy: Secondary | ICD-10-CM | POA: Diagnosis not present

## 2017-11-22 ENCOUNTER — Encounter: Payer: Self-pay | Admitting: Cardiovascular Disease

## 2017-11-22 NOTE — Procedures (Signed)
[facility]     Patient [name], [name] Date: 11/01/2017 Gender: Male D.O.B: 09-18-1955 Age (years): 7 Referring Provider: Shelva Majestic MD, ABSM Height (inches): 74 Interpreting Physician: Shelva Majestic MD, ABSM Weight (lbs): 270 RPSGT: Rosebud Poles BMI: 35 MRN: [id number] Neck Size: 18.00  CLINICAL INFORMATION The patient is referred for a CPAP titration to treat sleep apnea.  Date of NPSG: 10/06/2017: AHI 8.3/h; RDI 14.9/h; AHI during REM sleep 51.4/h; , oxygen desaturation to 81%.  SLEEP STUDY TECHNIQUE As per the AASM Manual for the Scoring of Sleep and Associated Events v2.3 (April 2016) with a hypopnea requiring 4% desaturations.  The channels recorded and monitored were frontal, central and occipital EEG, electrooculogram (EOG), submentalis EMG (chin), nasal and oral airflow, thoracic and abdominal wall motion, anterior tibialis EMG, snore microphone, electrocardiogram, and pulse oximetry. Continuous positive airway pressure (CPAP) was initiated at the beginning of the study and titrated to treat sleep-disordered breathing.  MEDICATIONS     Calcium Carb-Cholecalciferol (CALCIUM 1000 + D PO)         Choline Fenofibrate (TRILIPIX) 135 MG capsule         Coenzyme Q10 (CO Q 10) 100 MG CAPS         desipramine (NOPRAMIN) 100 MG tablet         enalapril (VASOTEC) 10 MG tablet         ferrous sulfate 325 (65 FE) MG tablet         Krill Oil 300 MG CAPS         leuprolide (LUPRON) 30 MG injection         megestrol (MEGACE) 20 MG tablet         meloxicam (MOBIC) 7.5 MG tablet         Multiple Vitamin (MULTIVITAMIN WITH MINERALS) TABS tablet         pantoprazole (PROTONIX) 40 MG tablet         simvastatin (ZOCOR) 40 MG tablet         venlafaxine XR (EFFEXOR-XR) 75 MG 24 hr capsule      Medications self-administered by patient taken the night of the study : N/A  TECHNICIAN COMMENTS Comments added by technician: CPAP therapy started at 5 cm of H20 and  increased to 7 cm of H20 in lateral positions. Patient unable to obtain sleep in supine position. PLMs were noted throughout testing. Patient tolerated CPAP very well. Suboptimal pressure obtained due to supine REM stage not observed. Oral breathing noticed throughout therapy  RESPIRATORY PARAMETERS Optimal PAP Pressure (cm):  AHI at Optimal Pressure (/hr): N/A Overall Minimal O2 (%): 92.0 Supine % at Optimal Pressure (%): N/A Minimal O2 at Optimal Pressure (%): 92.0   SLEEP ARCHITECTURE The study was initiated at 9:52:40 PM and ended at 5:51:32 AM.  Sleep onset time was 61.7 minutes and the sleep efficiency was 60.6%%. The total sleep time was 290.1 minutes.  The patient spent 8.4%% of the night in stage N1 sleep, 58.8%% in stage N2 sleep, 24.7%% in stage N3 and 8.10% in REM.Stage REM latency was 219.0 minutes  Wake after sleep onset was 127.0. Alpha intrusion was absent. Supine sleep was 0.00%.  CARDIAC DATA The 2 lead EKG demonstrated sinus rhythm. The mean heart rate was N/A beats per minute. Other EKG findings include: None.  LEG MOVEMENT DATA The total Periodic Limb Movements of Sleep (PLMS) were 0. The PLMS index was 0.0. A PLMS index of <15 is considered normal in adults.  IMPRESSIONS - An optimal PAP pressure could not be selected for this patient based on the available study data. - Central sleep apnea was not noted during this titration (CAI = 0.0/h). - Significant oxygen desaturations were not observed during this titration (min O2 = 92.0%). - The patient snored with loud snoring volume during this titration study. - No cardiac abnormalities were observed during this study. - Clinically significant periodic limb movements were not noted during this study. Arousals associated with PLMs were rare.  DIAGNOSIS - Obstructive Sleep Apnea (327.23 [G47.33 ICD-10])  RECOMMENDATIONS - Since the patient had previously documented severe sleep apnea during REM sleep whichwas not  achieved on the present titration, recommend an initial trial of CPAP Auto with C-Flex/EPR of 3 at 6- 20 cm H2O with heated humidification and mask of choice. - Efforts should be made to maximize nasal and oral pharyngeal patency. - Avoid alcohol, sedatives and other CNS depressants that may worsen sleep apnea and disrupt normal sleep architecture. - Sleep hygiene should be reviewed to assess factors that may improve sleep quality. - Weight management and regular exercise should be initiated or continued. - Recommend adownload be obtained in 30 days and the patient return to sleep clinic for re-evaluation after 4 weeks of therapy.   [Electronically signed] 11/22/2017 01:32 PM  Shelva Majestic MD, [facility], [place], American Board of Sleep Medicine   NPI: [id number]  [place] PH: [phone]   FX: [phone] [place]

## 2017-11-24 ENCOUNTER — Other Ambulatory Visit: Payer: Self-pay | Admitting: Cardiovascular Disease

## 2017-11-24 DIAGNOSIS — G4733 Obstructive sleep apnea (adult) (pediatric): Secondary | ICD-10-CM

## 2017-11-24 NOTE — Progress Notes (Signed)
Patient's wife notified. She requested for referral to be sent to [facility].

## 2017-12-02 DIAGNOSIS — E782 Mixed hyperlipidemia: Secondary | ICD-10-CM | POA: Diagnosis not present

## 2017-12-02 DIAGNOSIS — G894 Chronic pain syndrome: Secondary | ICD-10-CM | POA: Diagnosis not present

## 2017-12-02 DIAGNOSIS — M5416 Radiculopathy, lumbar region: Secondary | ICD-10-CM | POA: Diagnosis not present

## 2017-12-02 DIAGNOSIS — E119 Type 2 diabetes mellitus without complications: Secondary | ICD-10-CM | POA: Diagnosis not present

## 2017-12-02 DIAGNOSIS — Z1389 Encounter for screening for other disorder: Secondary | ICD-10-CM | POA: Diagnosis not present

## 2017-12-02 DIAGNOSIS — I10 Essential (primary) hypertension: Secondary | ICD-10-CM | POA: Diagnosis not present

## 2017-12-02 DIAGNOSIS — R7309 Other abnormal glucose: Secondary | ICD-10-CM | POA: Diagnosis not present

## 2017-12-07 DIAGNOSIS — D649 Anemia, unspecified: Secondary | ICD-10-CM | POA: Diagnosis not present

## 2017-12-08 DIAGNOSIS — M47816 Spondylosis without myelopathy or radiculopathy, lumbar region: Secondary | ICD-10-CM | POA: Diagnosis not present

## 2017-12-14 DIAGNOSIS — G4733 Obstructive sleep apnea (adult) (pediatric): Secondary | ICD-10-CM | POA: Diagnosis not present

## 2018-01-13 ENCOUNTER — Encounter: Payer: Self-pay | Admitting: Cardiovascular Disease

## 2018-01-13 ENCOUNTER — Ambulatory Visit: Payer: Medicare Other | Admitting: Cardiovascular Disease

## 2018-01-13 VITALS — BP 110/66 | HR 67 | Ht 74.0 in | Wt 271.2 lb

## 2018-01-13 DIAGNOSIS — G4733 Obstructive sleep apnea (adult) (pediatric): Secondary | ICD-10-CM

## 2018-01-13 DIAGNOSIS — C61 Malignant neoplasm of prostate: Secondary | ICD-10-CM | POA: Diagnosis not present

## 2018-01-13 DIAGNOSIS — E669 Obesity, unspecified: Secondary | ICD-10-CM

## 2018-01-13 DIAGNOSIS — E782 Mixed hyperlipidemia: Secondary | ICD-10-CM | POA: Diagnosis not present

## 2018-01-13 DIAGNOSIS — I10 Essential (primary) hypertension: Secondary | ICD-10-CM | POA: Diagnosis not present

## 2018-01-13 NOTE — Patient Instructions (Signed)
Follow-Up: Your physician wants you to follow-up in: 12 months with Dr. Kelly (sleep clinic). You will receive a reminder letter in the mail two months in advance. If you don't receive a letter, please call our office to schedule the follow-up appointment.      If you need a refill on your cardiac medications before your next appointment, please call your pharmacy.   

## 2018-01-13 NOTE — Progress Notes (Signed)
Cardiology Office Note    Date:  01/14/2018   ID:  [name], DOB 07-21-1956, MRN [id number]  PCP:  Redmond School, MD  Cardiologist:  Shelva Majestic, MD (sleep); Dr. Sallyanne Kuster  F/U sleep evaluation  History of Present Illness:  [name] is a 62 y.o. male who presents for sleep evaluation following his most recent sleep study and  CPAP therapy with a new machine.  [name] has a history of hypertension, hyperlipidemia, COPD, type 2 diabetes mellitus, obesity, as well as metastatic prostate CA on anti-antigen therapy.  He has a history of obstructive sleep apnea that dates back to 2012.  He has an old [employer] unit which is an auto unit which is set at 4 -20.  He brought his machine with him to the office today.  He has not been followed by a sleep physician only saw Dr. Chancy Milroy once after initiation of treatment.  In 2012.  Recently, he has not been sleeping well.  He is not getting adequate pressures.  The machine tends to cut off at times.  A download reveals a 30 day sleep average of 8 hours and 30 minutes.  Days greater than 4 hours were 29 out of 30.  AHI was 3.0.  He has noticed breakthrough snoring.  He wakes up at least 3 times per night for nocturia.  He is unaware of restless legs.  He denies bruxism.  An Epworth Sleepiness Scale score was calculated in the office today and this endorsed at 7 with a slight chance of dozing while sitting and reading, as a passenger in a car for an hour without a break, and while sitting quietly after lunch.  There is a moderate chance of dozing while watching television, and lying down to rest in the afternoon.  When I saw him for initial sleep evaluation in January his machine was started to malfunction and he was feeling that he was not getting adequate treatment.  Since it is been 7 to 8 years since his initial evaluation I recommended a repeat evaluation.  Apparently he had this done at [place] [facility].  His diagnostic study on October 06, 2017 with mild sleep apnea overall with an AHI of 8.3, but his RDI was 14.9.  However, he had severe sleep apnea with rem sleep with an AHI 51.4 and significant oxygen desaturation to 81%.  He also had frequent periodic limb movement disorder of sleep.  On that night, he did not meet split night criteria and returned in March for CPAP titration.  This was an inadequate titration.  Consequently he was set up with a CPAP auto unit with a minimum pressure of 6 a maximum of 20.  He is 100% compliant since reinitiation December 14, 2017.  He is averaging 8 hours and 18 minutes of sleep per night.  AHI is excellent at 1.2.  There was only few days with mask leak.  His 95th percentile pressure was 13.5 with maximum average pressure 15.5.  Typically he goes to bed arouind 10 p.m. and wakes up at 8 am.  He notices significant improvement with this new machine.  It is much quieter.  He is sleeping well.  He does not have any breakthrough snoring.  He denies any issues with painful restless legs despite being documented to have increased limb movement disorder of sleep.  Past Medical History:  Diagnosis Date  . Anxiety   . Arthritis   . Bladder stone   .  Cancer [facility]) 2007   prostate CA  . Colon cancer ([place])   . COPD (chronic obstructive pulmonary disease) ([place])   . Decreased pulse 08/16/2008   LE doppler - bilateral ABIs normal, bilateral PVRs normal waveform; bilateral common iliac arteries difficult to visualize however velocities suggest less than 50% diameter reduction; bilateral LE normal velocities w/o evidence of diameter reduction  . Depression   . GERD (gastroesophageal reflux disease)   . Hypercholesteremia   . Hypertension   . Mental disorder    PTSD  . Shortness of breath   . Sleep apnea    wears CPAP nightly, settings at 14   . Tobacco abuse   . Urinary incontinence    due to prostate removal    Past Surgical History:  Procedure Laterality Date  . ANTERIOR LAT LUMBAR FUSION N/A  11/17/2012   Procedure: XLIF L3 - L4 POSTERIOR L3 -L4 FUSION INSTRUMENTATION/L2 - L3 DECOMPRESSION (REVISION OF POSTERIOR HARDWARE) 1 LEVEL;  Surgeon: Melina Schools, MD;  Location: [place];  Service: Orthopedics;  Laterality: N/A;  procedure #1: start 0923, end 1057.   [name] BACK SURGERY  2010 / 2014  . BLADDER SUSPENSION     x 2  . CARDIAC CATHETERIZATION     no PCI  . CARDIOVASCULAR STRESS TEST  08/16/2008   EF 57%; inferior region has diaphragmatic attenuation, otherwise normal perfusion w/o evidence of ischemia or infarct; LV normal in size; no scintographic evidence of inducible ischemia; no ECG changes, negative for ischemia; low risk scan  . CERVICAL FUSION    . COLON RESECTION N/A 07/12/2013   peritoneal biopsy only, evidence of metastatic prostate cancer  . COLONOSCOPY    . COLONOSCOPY N/A 05/27/2013   RMR: Extrinsic mass effect at the level of the appendiceal orifice likely represents an appendiceal or periappendiceal process. Neovascular changes of the rectal mucosa consistent with prior history of radiation treatment. Multiple 3-5 mm polyps removed, several tubular adenomas. Diverticulosis.nex tcs 2019  . CYSTOSCOPY W/ URETERAL STENT PLACEMENT N/A 10/03/2013   Procedure: CYSTOSCOPY AND REMOVAL OF BLADDER NECK STONE ;  Surgeon: Dutch Gray, MD;  Location: [facility];  Service: Urology;  Laterality: N/A;  WITH REMOVAL OF BLADDER NECK STONE    . ESOPHAGOGASTRODUODENOSCOPY (EGD) WITH ESOPHAGEAL DILATION N/A 05/27/2013   RMR: Mild erosive reflux esophagitis with soft noncritical appearing stricture status post Maloney dilation, antral erosions with reactive gastropathy but no H. pylori  . HOLMIUM LASER APPLICATION N/A 01/24/6072   Procedure: HOLMIUM LASER APPLICATION;  Surgeon: Dutch Gray, MD;  Location: [facility];  Service: Urology;  Laterality: N/A;  . PROSTATE SURGERY    . TRANSTHORACIC ECHOCARDIOGRAM  08/16/2008   essentially normal for age  . URINARY SPHINCTER IMPLANT N/A 01/31/2014   Procedure:  IMPLANTATION ARTIFICIAL SPHINCTER  WITH CYSTOSCOPY;  Surgeon: Reece Packer, MD;  Location: [facility];  Service: Urology;  Laterality: N/A;  . WRIST SURGERY Right     Current Medications: Outpatient Medications Prior to Visit  Medication Sig Dispense Refill  . Calcium Carb-Cholecalciferol (CALCIUM 1000 + D PO) Take 1 tablet by mouth 2 (two) times daily.     . Choline Fenofibrate (TRILIPIX) 135 MG capsule Take 135 mg by mouth daily.    . Coenzyme Q10 (CO Q 10) 100 MG CAPS Take 1 capsule by mouth daily.    [name] desipramine (NOPRAMIN) 100 MG tablet Take 1 tablet by mouth at bedtime.  0  . enalapril (VASOTEC) 10 MG tablet Take 10 mg by mouth every morning.     [name]  ferrous sulfate 325 (65 FE) MG tablet Take 1 tablet by mouth daily.  0  . Krill Oil 300 MG CAPS Take 300 mg by mouth daily.    [name] leuprolide (LUPRON) 30 MG injection Inject 30 mg into the muscle every 3 (three) months.    . megestrol (MEGACE) 20 MG tablet Take 0.5 tablets by mouth 2 (two) times daily.  0  . meloxicam (MOBIC) 7.5 MG tablet Take 1 tablet by mouth as needed.  0  . Multiple Vitamin (MULTIVITAMIN WITH MINERALS) TABS tablet Take 1 tablet by mouth daily.    . pantoprazole (PROTONIX) 40 MG tablet TAKE 1 TABLET BY MOUTH  DAILY 90 tablet 3  . simvastatin (ZOCOR) 40 MG tablet Take 40 mg by mouth every evening.    . venlafaxine XR (EFFEXOR-XR) 75 MG 24 hr capsule Take 1 capsule by mouth daily.  0   No facility-administered medications prior to visit.      Allergies:   Penicillins   Social History   Socioeconomic History  . Marital status: Married    Spouse name: Not on file  . Number of children: Not on file  . Years of education: Not on file  . Highest education level: Not on file  Occupational History  . Not on file  Social Needs  . Financial resource strain: Not on file  . Food insecurity:    Worry: Not on file    Inability: Not on file  . Transportation needs:    Medical: Not on file    Non-medical: Not on file    Tobacco Use  . Smoking status: Former Smoker    Packs/day: 0.25    Years: 43.00    Pack years: 10.75    Types: Cigarettes  . Smokeless tobacco: Former Network engineer and Sexual Activity  . Alcohol use: No  . Drug use: No  . Sexual activity: Not on file  Lifestyle  . Physical activity:    Days per week: Not on file    Minutes per session: Not on file  . Stress: Not on file  Relationships  . Social connections:    Talks on phone: Not on file    Gets together: Not on file    Attends religious service: Not on file    Active member of club or organization: Not on file    Attends meetings of clubs or organizations: Not on file    Relationship status: Not on file  Other Topics Concern  . Not on file  Social History Narrative  . Not on file     Family History:  The patient's family history includes Colon polyps in his father; Diabetes in his brother, father, and mother; Heart disease in his mother; Hypertension in his brother, father, and mother.  His father is 76 and had CABG.  Mother died at with a myocardial infarction.  He has 3 brothers and 1 sister.  Siblings have diabetes mellitus in his sister had undergone CABG surgery.  ROS General: Negative; No fevers, chills, or night sweats;  HEENT: Negative; No changes in vision or hearing, sinus congestion, difficulty swallowing Pulmonary: Negative; No cough, wheezing, shortness of breath, hemoptysis Cardiovascular: Negative; No chest pain, presyncope, syncope, palpitations GI: Negative; No nausea, vomiting, diarrhea, or abdominal pain GU: Negative; No dysuria, hematuria, or difficulty voiding Musculoskeletal: Negative; no myalgias, joint pain, or weakness Hematologic/Oncology: Negative; no easy bruising, bleeding Endocrine: Negative; no heat/cold intolerance; no diabetes Neuro: Negative; no changes in balance, headaches Skin: Negative; No  rashes or skin lesions Psychiatric: Negative; No behavioral problems, depression Sleep:  Positive for OSA with residual snoring, no bruxism, restless legs, hypnogognic hallucinations, no cataplexy Other comprehensive 14 point system review is negative.   PHYSICAL EXAM:   VS:  BP 110/66   Pulse 67   Ht '6\' 2"'  (1.88 m)   Wt 271 lb 3.2 oz (123 kg)   BMI 34.82 kg/m     Repeat blood pressure by me was 110/70  Wt Readings from Last 3 Encounters:  01/13/18 271 lb 3.2 oz (123 kg)  09/23/17 267 lb 9.6 oz (121.4 kg)  05/22/17 281 lb (127.5 kg)    General: Alert, oriented, no distress.  Skin: normal turgor, no rashes, warm and dry HEENT: Normocephalic, atraumatic. Pupils equal round and reactive to light; sclera anicteric; extraocular muscles intact;  Nose without nasal septal hypertrophy Mouth/Parynx benign; Mallinpatti scale 3  Neck: No JVD, no carotid bruits; normal carotid upstroke Lungs: clear to ausculatation and percussion; no wheezing or rales Chest wall: without tenderness to palpitation Heart: PMI not displaced, RRR, s1 s2 normal, 1/6 systolic murmur, no diastolic murmur, no rubs, gallops, thrills, or heaves Abdomen: soft, nontender; no hepatosplenomehaly, BS+; abdominal aorta nontender and not dilated by palpation. Back: no CVA tenderness Pulses 2+ Musculoskeletal: full range of motion, normal strength, no joint deformities Extremities: no clubbing cyanosis or edema, Homan's sign negative  Neurologic: grossly nonfocal; Cranial nerves grossly wnl Psychologic: Normal mood and affect   Studies/Labs Reviewed:   EKG:  EKG is is  ordered today.  ECG (independently read by me): Normal sinus rhythm at 67 bpm.  No ectopy.  Normal intervals.  Recent Labs: BMP Latest Ref Rng & Units 02/01/2014 01/24/2014 09/29/2013  Glucose 70 - 99 mg/dL 137(H) 133(H) 109(H)  BUN 6 - 23 mg/dL '8 12 17  ' Creatinine 0.50 - 1.35 mg/dL 0.88 0.90 0.91  Sodium 137 - 147 mEq/L 140 140 138  Potassium 3.7 - 5.3 mEq/L 4.2 4.3 5.0  Chloride 96 - 112 mEq/L 106 103 102  CO2 19 - 32 mEq/L '24 24 26    ' Calcium 8.4 - 10.5 mg/dL 9.0 9.8 9.9     Hepatic Function Latest Ref Rng & Units 07/06/2013  Total Protein 6.0 - 8.3 g/dL 7.3  Albumin 3.5 - 5.2 g/dL 4.1  AST 0 - 37 U/L 47(H)  ALT 0 - 53 U/L 18  Alk Phosphatase 39 - 117 U/L 62  Total Bilirubin 0.3 - 1.2 mg/dL 0.2(L)    CBC Latest Ref Rng & Units 02/01/2014 01/31/2014 01/24/2014  WBC 4.0 - 10.5 K/uL - - 6.9  Hemoglobin 13.0 - 17.0 g/dL 10.5(L) 11.2(L) 12.2(L)  Hematocrit 39.0 - 52.0 % 31.2(L) 33.6(L) 36.9(L)  Platelets 150 - 400 K/uL - - 340   Lab Results  Component Value Date   MCV 95.3 01/24/2014   MCV 92.8 09/29/2013   MCV 95.5 07/12/2013   No results found for: TSH No results found for: HGBA1C   BNP No results found for: BNP  ProBNP No results found for: PROBNP   Lipid Panel  No results found for: CHOL, TRIG, HDL, CHOLHDL, VLDL, LDLCALC, LDLDIRECT   RADIOLOGY: No results found.   Additional studies/ records that were reviewed today include:  I reviewed the records from Dr. Sallyanne Kuster.  I  Interrogated his CPAP machine for date and compliance.    ASSESSMENT:    1. OSA (obstructive sleep apnea)   2. Obesity (BMI 30-39.9)   3. Essential hypertension   4.  Prostate CA ([place])   5. Mixed hyperlipidemia     PLAN:  [name] is a 62 year old gentleman who has a history of hypertension, hyperlipidemia, COPD, diabetes mellitus, as well as obesity.  He is on anti-antigen therapy for metastatic prostate cancer.  He has obstructive sleep apnea originally diagnosed in 2012 and has been on CPAP therapy since that time.  I initially saw him, he was not getting adequate pressure with his old CPAP machine and this was intermittently not working.  I reviewed his diagnostic polysomnogram and CPAP titration studies with him in detail.  He has been on CPAP auto therapy since his new set up date of December 14, 2017.  His use has been 100% since reinitiation.  AHI is excellent at 1.2.  I am changing his auto mode and increasing  his minimum pressure to 10 and he will continue with a maximum pressure of 20 cm of water.  His blood pressure today is stable and he continues to be on enalapril 10 mg daily.  He is on combination therapy with simvastatin and try lipids for his hyperlipidemia.  He continues to be on RP for his prostate CA.  A new Epworth Sleepiness Scale today endorsed at 9 which was improved from his prior evaluation.  BMI is increased at 34.82.  We discussed weight loss and exercise.  As long as he is continuing to do well I will see him in 1 year for reevaluation or sooner problems arise.   Medication Adjustments/Labs and Tests Ordered: Current medicines are reviewed at length with the patient today.  Concerns regarding medicines are outlined above.  Medication changes, Labs and Tests ordered today are listed in the Patient Instructions below. Patient Instructions  Follow-Up: Your physician wants you to follow-up in: 12 months with Dr. Claiborne Billings (sleep clinic). You will receive a reminder letter in the mail two months in advance. If you don't receive a letter, please call our office to schedule the follow-up appointment.    If you need a refill on your cardiac medications before your next appointment, please call your pharmacy.      Signed, Shelva Majestic, MD  01/14/2018 8:01 PM    [facility] [address], [place], [place], [place]  [address] Phone: [phone]

## 2018-01-14 ENCOUNTER — Encounter: Payer: Self-pay | Admitting: Cardiovascular Disease

## 2018-01-29 ENCOUNTER — Encounter: Payer: Self-pay | Admitting: Internal Medicine

## 2018-02-05 DIAGNOSIS — C61 Malignant neoplasm of prostate: Secondary | ICD-10-CM | POA: Diagnosis not present

## 2018-02-12 DIAGNOSIS — C61 Malignant neoplasm of prostate: Secondary | ICD-10-CM | POA: Diagnosis not present

## 2018-02-13 DIAGNOSIS — G4733 Obstructive sleep apnea (adult) (pediatric): Secondary | ICD-10-CM | POA: Diagnosis not present

## 2018-03-15 DIAGNOSIS — G4733 Obstructive sleep apnea (adult) (pediatric): Secondary | ICD-10-CM | POA: Diagnosis not present

## 2018-03-22 DIAGNOSIS — G4733 Obstructive sleep apnea (adult) (pediatric): Secondary | ICD-10-CM | POA: Diagnosis not present

## 2018-04-01 ENCOUNTER — Other Ambulatory Visit: Payer: Self-pay

## 2018-04-01 NOTE — Patient Outreach (Signed)
[facility]) Care Management  04/01/2018  [name] 09-Feb-1956 [id number]   Telephone Screen  Referral Date: 04/01/18 Referral Source: EMMI Prevent Referral Reason: " patient engagement told score of 9, COPD, HTN,CA" Insurance: [facility] Medicare   Outreach attempt # 1 to patient. No answer at present and unable to leave message.      Plan: RN CM will make outreach attempt to patient within 3-4 business days. RN CM will send unsuccessful outreach letter to patient.    Michael Montgomery, RN,BSN,CCM [place] Management Telephonic Care Management Coordinator Direct Phone: [phone] Toll Free: [phone] Fax: [phone]

## 2018-04-02 ENCOUNTER — Other Ambulatory Visit: Payer: Self-pay

## 2018-04-02 NOTE — Patient Outreach (Signed)
[place] [facility]) Care Management  04/02/2018  [name] 12-Jun-1956 [id number]   Telephone Screen  Referral Date: 04/01/18 Referral Source: EMMI Prevent Referral Reason: " patient engagement told score of 9, COPD, HTN,CA" Insurance: [facility] Medicare   Outreach attempt #2 to patient. No answer at present.     Plan: RN CM will make outreach attempt to patient within 3-4 business days.   Enzo Montgomery, RN,BSN,CCM [place] Management Telephonic Care Management Coordinator Direct Phone: [phone] Toll Free: [phone] Fax: [phone]

## 2018-04-06 ENCOUNTER — Other Ambulatory Visit: Payer: Self-pay

## 2018-04-06 DIAGNOSIS — I10 Essential (primary) hypertension: Secondary | ICD-10-CM | POA: Diagnosis not present

## 2018-04-06 DIAGNOSIS — Z0001 Encounter for general adult medical examination with abnormal findings: Secondary | ICD-10-CM | POA: Diagnosis not present

## 2018-04-06 DIAGNOSIS — K219 Gastro-esophageal reflux disease without esophagitis: Secondary | ICD-10-CM | POA: Diagnosis not present

## 2018-04-06 DIAGNOSIS — Z1389 Encounter for screening for other disorder: Secondary | ICD-10-CM | POA: Diagnosis not present

## 2018-04-06 DIAGNOSIS — E1169 Type 2 diabetes mellitus with other specified complication: Secondary | ICD-10-CM | POA: Diagnosis not present

## 2018-04-06 DIAGNOSIS — M1991 Primary osteoarthritis, unspecified site: Secondary | ICD-10-CM | POA: Diagnosis not present

## 2018-04-06 NOTE — Patient Outreach (Signed)
[place] [facility]) Care Management  04/06/2018  ISAIHA ASARE 1956/07/20 [id number]   Telephone Screen  Referral Date:04/01/18 Referral Source:EMMI Prevent Referral Reason:" patient engagement told score of 9, COPD, HTN,CA" Insurance:[employer] Medicare   Outreach attempt #3 to patient. No answer and unable to leave message.      Plan: RN CM will close case if no response from letter mailed to patient.    Enzo Montgomery, RN,BSN,CCM [place] Management Telephonic Care Management Coordinator Direct Phone: [phone] Toll Free: [phone] Fax: [phone]

## 2018-04-07 ENCOUNTER — Ambulatory Visit: Payer: Self-pay

## 2018-04-13 ENCOUNTER — Other Ambulatory Visit: Payer: Self-pay | Admitting: Gastroenterology

## 2018-04-14 ENCOUNTER — Other Ambulatory Visit: Payer: Self-pay

## 2018-04-14 NOTE — Patient Outreach (Signed)
[place] [facility]) Care Management  04/14/2018  [name] 1956-07-30 [id number]   Telephone Screen  Referral Date:04/01/18 Referral Source:EMMI Prevent Referral Reason:" patient engagement told score of 9, COPD, HTN,CA" Insurance:[employer] Medicare    Multiple attempts to establish contact with patient without success. No response from letter mailed to patient. Case is being closed at this time.     Plan: RN CM will close case at this time.   Enzo Montgomery, RN,BSN,CCM [place] Management Telephonic Care Management Coordinator Direct Phone: [phone] Toll Free: [phone] Fax: [phone]

## 2018-04-15 DIAGNOSIS — G4733 Obstructive sleep apnea (adult) (pediatric): Secondary | ICD-10-CM | POA: Diagnosis not present

## 2018-05-16 DIAGNOSIS — G4733 Obstructive sleep apnea (adult) (pediatric): Secondary | ICD-10-CM | POA: Diagnosis not present

## 2018-05-21 ENCOUNTER — Encounter: Payer: Self-pay | Admitting: *Deleted

## 2018-05-21 ENCOUNTER — Other Ambulatory Visit: Payer: Self-pay | Admitting: *Deleted

## 2018-05-21 ENCOUNTER — Ambulatory Visit: Payer: Medicare Other | Admitting: Gastroenterology

## 2018-05-21 ENCOUNTER — Encounter: Payer: Self-pay | Admitting: Gastroenterology

## 2018-05-21 VITALS — BP 133/84 | HR 80 | Temp 97.1°F | Ht 74.0 in | Wt 266.0 lb

## 2018-05-21 DIAGNOSIS — Z86010 Personal history of colonic polyps: Secondary | ICD-10-CM

## 2018-05-21 DIAGNOSIS — R1310 Dysphagia, unspecified: Secondary | ICD-10-CM

## 2018-05-21 DIAGNOSIS — R1319 Other dysphagia: Secondary | ICD-10-CM

## 2018-05-21 MED ORDER — PANTOPRAZOLE SODIUM 40 MG PO TBEC: 40.0000 mg | DELAYED_RELEASE_TABLET | Freq: Every day | ORAL | 3 refills | Status: DC

## 2018-05-21 MED ORDER — PEG 3350-KCL-NA BICARB-NACL 420 G PO SOLR: 4000.0000 mL | Freq: Once | ORAL | 0 refills | Status: AC

## 2018-05-21 NOTE — Patient Instructions (Signed)
We have scheduled you for a colonoscopy, upper endoscopy, and dilation with Dr. Gala Romney in the near future.  Please stop iron 7 days before the procedure.  We will see you back in 1 year or sooner if needed!  It was a pleasure to see you today. I strive to create trusting relationships with patients to provide genuine, compassionate, and quality care. I value your feedback. If you receive a survey regarding your visit,  I greatly appreciate you taking time to fill this out.   Annitta Needs, PhD, ANP-BC [facility]

## 2018-05-21 NOTE — Progress Notes (Signed)
Referring Provider: Redmond School, MD Primary Care Physician:  Redmond School, MD  Chief Complaint  Patient presents with  . Consult    TCS due for 5 yr  . Gastroesophageal Reflux    f/u. Doing okay  . Dysphagia    occas has an issue    HPI:   [name] is a 62 y.o. male presenting today with a history of multiple ademonas in 2014, due for surveillance colonoscopy now. History of mild erosive reflux esophagitis, with dilation of stricture in 2014.    Started back with dysphagia about a year ago. Solid food dysphagia. No liquid dysphagia. No pill dysphagia. Protonix once daily. No constipation or diarrhea. No rectal bleeding. No unexplained weight loss or lack of appetite.   Past Medical History:  Diagnosis Date  . Anxiety   . Arthritis   . Bladder stone   . Cancer [facility]) 2007   prostate CA  . Colon cancer ([place])   . COPD (chronic obstructive pulmonary disease) ([place])   . Decreased pulse 08/16/2008   LE doppler - bilateral ABIs normal, bilateral PVRs normal waveform; bilateral common iliac arteries difficult to visualize however velocities suggest less than 50% diameter reduction; bilateral LE normal velocities w/o evidence of diameter reduction  . Depression   . GERD (gastroesophageal reflux disease)   . Hypercholesteremia   . Hypertension   . Mental disorder    PTSD  . Shortness of breath   . Sleep apnea    wears CPAP nightly, settings at 14   . Tobacco abuse   . Urinary incontinence    due to prostate removal    Past Surgical History:  Procedure Laterality Date  . ANTERIOR LAT LUMBAR FUSION N/A 11/17/2012   Procedure: XLIF L3 - L4 POSTERIOR L3 -L4 FUSION INSTRUMENTATION/L2 - L3 DECOMPRESSION (REVISION OF POSTERIOR HARDWARE) 1 LEVEL;  Surgeon: Melina Schools, MD;  Location: [place];  Service: Orthopedics;  Laterality: N/A;  procedure #1: start 0923, end 1057.   [name] BACK SURGERY  2010 / 2014  . BLADDER SUSPENSION     x 2  . CARDIAC CATHETERIZATION     no PCI    . CARDIOVASCULAR STRESS TEST  08/16/2008   EF 57%; inferior region has diaphragmatic attenuation, otherwise normal perfusion w/o evidence of ischemia or infarct; LV normal in size; no scintographic evidence of inducible ischemia; no ECG changes, negative for ischemia; low risk scan  . CERVICAL FUSION    . COLON RESECTION N/A 07/12/2013   peritoneal biopsy only, evidence of metastatic prostate cancer  . COLONOSCOPY    . COLONOSCOPY N/A 05/27/2013   RMR: Extrinsic mass effect at the level of the appendiceal orifice likely represents an appendiceal or periappendiceal process. Neovascular changes of the rectal mucosa consistent with prior history of radiation treatment. Multiple 3-5 mm polyps removed, several tubular adenomas. Diverticulosis.nex tcs 2019  . CYSTOSCOPY W/ URETERAL STENT PLACEMENT N/A 10/03/2013   Procedure: CYSTOSCOPY AND REMOVAL OF BLADDER NECK STONE ;  Surgeon: Dutch Gray, MD;  Location: [facility];  Service: Urology;  Laterality: N/A;  WITH REMOVAL OF BLADDER NECK STONE    . ESOPHAGOGASTRODUODENOSCOPY (EGD) WITH ESOPHAGEAL DILATION N/A 05/27/2013   RMR: Mild erosive reflux esophagitis with soft noncritical appearing stricture status post Maloney dilation, antral erosions with reactive gastropathy but no H. pylori  . HOLMIUM LASER APPLICATION N/A 10/23/5398   Procedure: HOLMIUM LASER APPLICATION;  Surgeon: Dutch Gray, MD;  Location: [facility];  Service: Urology;  Laterality: N/A;  .  PROSTATE SURGERY    . TRANSTHORACIC ECHOCARDIOGRAM  08/16/2008   essentially normal for age  . URINARY SPHINCTER IMPLANT N/A 01/31/2014   Procedure: IMPLANTATION ARTIFICIAL SPHINCTER  WITH CYSTOSCOPY;  Surgeon: Reece Packer, MD;  Location: [facility];  Service: Urology;  Laterality: N/A;  . WRIST SURGERY Right     Current Outpatient Medications  Medication Sig Dispense Refill  . Calcium Carb-Cholecalciferol (CALCIUM 1000 + D PO) Take 1 tablet by mouth 2 (two) times daily.     . Choline Fenofibrate (TRILIPIX)  135 MG capsule Take 135 mg by mouth daily.    . Coenzyme Q10 (CO Q 10) 100 MG CAPS Take 1 capsule by mouth daily.    [name] desipramine (NOPRAMIN) 100 MG tablet Take 1 tablet by mouth at bedtime.  0  . enalapril (VASOTEC) 10 MG tablet Take 10 mg by mouth every morning.     . ferrous sulfate 325 (65 FE) MG tablet Take 1 tablet by mouth daily.  0  . Krill Oil 300 MG CAPS Take 300 mg by mouth daily.    [name] leuprolide (LUPRON) 30 MG injection Inject 30 mg into the muscle every 3 (three) months.    . megestrol (MEGACE) 20 MG tablet Take 0.5 tablets by mouth 2 (two) times daily.  0  . meloxicam (MOBIC) 7.5 MG tablet Take 1 tablet by mouth as needed.  0  . Multiple Vitamin (MULTIVITAMIN WITH MINERALS) TABS tablet Take 1 tablet by mouth daily.    . pantoprazole (PROTONIX) 40 MG tablet TAKE 1 TABLET BY MOUTH  DAILY 90 tablet 3  . simvastatin (ZOCOR) 40 MG tablet Take 40 mg by mouth every evening.    . venlafaxine XR (EFFEXOR-XR) 75 MG 24 hr capsule Take 1 capsule by mouth daily.  0   No current facility-administered medications for this visit.     Allergies as of 05/21/2018 - Review Complete 05/21/2018  Allergen Reaction Noted  . Penicillins Anaphylaxis 09/30/2011    Family History  Problem Relation Age of Onset  . Diabetes Mother   . Hypertension Mother   . Heart disease Mother   . Diabetes Father   . Hypertension Father   . Colon polyps Father        age 75  . Hypertension Brother   . Diabetes Brother   . Colon cancer Neg Hx     Social History   Socioeconomic History  . Marital status: Married    Spouse name: Not on file  . Number of children: Not on file  . Years of education: Not on file  . Highest education level: Not on file  Occupational History  . Not on file  Social Needs  . Financial resource strain: Not on file  . Food insecurity:    Worry: Not on file    Inability: Not on file  . Transportation needs:    Medical: Not on file    Non-medical: Not on file  Tobacco Use    . Smoking status: Former Smoker    Packs/day: 0.25    Years: 43.00    Pack years: 10.75    Types: Cigarettes  . Smokeless tobacco: Former Network engineer and Sexual Activity  . Alcohol use: No  . Drug use: No  . Sexual activity: Not on file  Lifestyle  . Physical activity:    Days per week: Not on file    Minutes per session: Not on file  . Stress: Not on file  Relationships  .  Social connections:    Talks on phone: Not on file    Gets together: Not on file    Attends religious service: Not on file    Active member of club or organization: Not on file    Attends meetings of clubs or organizations: Not on file    Relationship status: Not on file  Other Topics Concern  . Not on file  Social History Narrative  . Not on file    Review of Systems: Gen: Denies fever, chills, anorexia. Denies fatigue, weakness, weight loss.  CV: Denies chest pain, palpitations, syncope, peripheral edema, and claudication. Resp: Denies dyspnea at rest, cough, wheezing, coughing up blood, and pleurisy. GI: see HPI  Derm: Denies rash, itching, dry skin Psych: Denies depression, anxiety, memory loss, confusion. No homicidal or suicidal ideation.  Heme: Denies bruising, bleeding, and enlarged lymph nodes.  Physical Exam: BP 133/84   Pulse 80   Temp (!) 97.1 F (36.2 C) (Oral)   Ht 6\' 2"  (1.88 m)   Wt 266 lb (120.7 kg)   BMI 34.15 kg/m  General:   Alert and oriented. No distress noted. Pleasant and cooperative.  Head:  Normocephalic and atraumatic. Eyes:  Conjuctiva clear without scleral icterus. Mouth:  Oral mucosa pink and moist.  Lungs: clear to auscultation bilaterally Cardiac: S1 S2 present without murmurs  Abdomen:  +BS, soft, non-tender and non-distended. No rebound or guarding. No HSM or masses noted. Msk:  Symmetrical without gross deformities. Normal posture. Extremities:  Without edema. Neurologic:  Alert and  oriented x4 Psych:  Alert and cooperative. Normal mood and  affect.

## 2018-05-24 NOTE — Assessment & Plan Note (Signed)
62 year old male with history of multiple adenomas in 2014, due for surveillance now. No concerning lower GI symptoms currently.  Proceed with TCS with Dr. Gala Romney in near future: the risks, benefits, and alternatives have been discussed with the patient in detail. The patient states understanding and desires to proceed. Propofol due to polypharmacy

## 2018-05-24 NOTE — Assessment & Plan Note (Signed)
Known history of mild erosive reflux esophagitis, stricture dilation in 2014. Now with recurrent solid food dysphagia approximately 1 year. Protonix daily.  Will pursue EGD/dilation at time of colonoscopy. Risks and benefits discussed with stated understanding. Propofol due to polypharmacy.  Continue Protonix daily Follow-up after procedure

## 2018-05-25 ENCOUNTER — Telehealth: Payer: Self-pay | Admitting: *Deleted

## 2018-05-25 NOTE — Progress Notes (Signed)
CC'D TO PCP °

## 2018-05-25 NOTE — Telephone Encounter (Signed)
Pre-op scheduled for 06/30/18 at 11:00am. Patient aware. Letter mailed.

## 2018-05-26 DIAGNOSIS — C799 Secondary malignant neoplasm of unspecified site: Secondary | ICD-10-CM | POA: Diagnosis not present

## 2018-05-27 ENCOUNTER — Other Ambulatory Visit: Payer: Self-pay | Admitting: Urology

## 2018-05-27 DIAGNOSIS — C61 Malignant neoplasm of prostate: Secondary | ICD-10-CM

## 2018-06-01 ENCOUNTER — Other Ambulatory Visit (HOSPITAL_COMMUNITY): Payer: Self-pay

## 2018-06-15 DIAGNOSIS — G4733 Obstructive sleep apnea (adult) (pediatric): Secondary | ICD-10-CM | POA: Diagnosis not present

## 2018-06-23 NOTE — Patient Instructions (Signed)
[name]  06/23/2018     @PREFPERIOPPHARMACY @   Your procedure is scheduled on  07/05/2018 .  Report to [facility] at  745   A.M.  Call this number if you have problems the morning of surgery:  [phone]   Remember:  Follow the diet and prep instructions given to you by Dr Roseanne Kaufman office.                     Take these medicines the morning of surgery with A SIP OF WATER Vasotec, mobic ( if needed), protonix, effexor.    Do not wear jewelry, make-up or nail polish.  Do not wear lotions, powders, or perfumes, or deodorant.  Do not shave 48 hours prior to surgery.  Men may shave face and neck.  Do not bring valuables to the hospital.  [facility] is not responsible for any belongings or valuables.  Contacts, dentures or bridgework may not be worn into surgery.  Leave your suitcase in the car.  After surgery it may be brought to your room.  For patients admitted to the hospital, discharge time will be determined by your treatment team.  Patients discharged the day of surgery will not be allowed to drive home.   Name and phone number of your driver:   family Special instructions:  None  Please read over the following fact sheets that you were given. Anesthesia Post-op Instructions and Care and Recovery After Surgery       Esophagogastroduodenoscopy Esophagogastroduodenoscopy (EGD) is a procedure to examine the lining of the esophagus, stomach, and first part of the small intestine (duodenum). This procedure is done to check for problems such as inflammation, bleeding, ulcers, or growths. During this procedure, a long, flexible, lighted tube with a camera attached (endoscope) is inserted down the throat. Tell a health care provider about:  Any allergies you have.  All medicines you are taking, including vitamins, herbs, eye drops, creams, and over-the-counter medicines.  Any problems you or family members have had with anesthetic  medicines.  Any blood disorders you have.  Any surgeries you have had.  Any medical conditions you have.  Whether you are pregnant or may be pregnant. What are the risks? Generally, this is a safe procedure. However, problems may occur, including:  Infection.  Bleeding.  A tear (perforation) in the esophagus, stomach, or duodenum.  Trouble breathing.  Excessive sweating.  Spasms of the larynx.  A slowed heartbeat.  Low blood pressure.  What happens before the procedure?  Follow instructions from your health care provider about eating or drinking restrictions.  Ask your health care provider about: ? Changing or stopping your regular medicines. This is especially important if you are taking diabetes medicines or blood thinners. ? Taking medicines such as aspirin and ibuprofen. These medicines can thin your blood. Do not take these medicines before your procedure if your health care provider instructs you not to.  Plan to have someone take you home after the procedure.  If you wear dentures, be ready to remove them before the procedure. What happens during the procedure?  To reduce your risk of infection, your health care team will wash or sanitize their hands.  An IV tube will be put in a vein in your hand or arm. You will get medicines and fluids through this tube.  You will be given one or more of the  following: ? A medicine to help you relax (sedative). ? A medicine to numb the area (local anesthetic). This medicine may be sprayed into your throat. It will make you feel more comfortable and keep you from gagging or coughing during the procedure. ? A medicine for pain.  A mouth guard may be placed in your mouth to protect your teeth and to keep you from biting on the endoscope.  You will be asked to lie on your left side.  The endoscope will be lowered down your throat into your esophagus, stomach, and duodenum.  Air will be put into the endoscope. This will  help your health care provider see better.  The lining of your esophagus, stomach, and duodenum will be examined.  Your health care provider may: ? Take a tissue sample so it can be looked at in a lab (biopsy). ? Remove growths. ? Remove objects (foreign bodies) that are stuck. ? Treat any bleeding with medicines or other devices that stop tissue from bleeding. ? Widen (dilate) or stretch narrowed areas of your esophagus and stomach.  The endoscope will be taken out. The procedure may vary among health care providers and hospitals. What happens after the procedure?  Your blood pressure, heart rate, breathing rate, and blood oxygen level will be monitored often until the medicines you were given have worn off.  Do not eat or drink anything until the numbing medicine has worn off and your gag reflex has returned. This information is not intended to replace advice given to you by your health care provider. Make sure you discuss any questions you have with your health care provider. Document Released: 12/12/2004 Document Revised: 01/17/2016 Document Reviewed: 07/05/2015 Elsevier Interactive Patient Education  2018 [employer]. Esophagogastroduodenoscopy, Care After Refer to this sheet in the next few weeks. These instructions provide you with information about caring for yourself after your procedure. Your health care provider may also give you more specific instructions. Your treatment has been planned according to current medical practices, but problems sometimes occur. Call your health care provider if you have any problems or questions after your procedure. What can I expect after the procedure? After the procedure, it is common to have:  A sore throat.  Nausea.  Bloating.  Dizziness.  Fatigue.  Follow these instructions at home:  Do not eat or drink anything until the numbing medicine (local anesthetic) has worn off and your gag reflex has returned. You will know that the  local anesthetic has worn off when you can swallow comfortably.  Do not drive for 24 hours if you received a medicine to help you relax (sedative).  If your health care provider took a tissue sample for testing during the procedure, make sure to get your test results. This is your responsibility. Ask your health care provider or the department performing the test when your results will be ready.  Keep all follow-up visits as told by your health care provider. This is important. Contact a health care provider if:  You cannot stop coughing.  You are not urinating.  You are urinating less than usual. Get help right away if:  You have trouble swallowing.  You cannot eat or drink.  You have throat or chest pain that gets worse.  You are dizzy or light-headed.  You faint.  You have nausea or vomiting.  You have chills.  You have a fever.  You have severe abdominal pain.  You have black, tarry, or bloody stools. This information is not  intended to replace advice given to you by your health care provider. Make sure you discuss any questions you have with your health care provider. Document Released: 07/28/2012 Document Revised: 01/17/2016 Document Reviewed: 07/05/2015 Elsevier Interactive Patient Education  2018 [employer].  Esophageal Dilatation Esophageal dilatation is a procedure to open a blocked or narrowed part of the esophagus. The esophagus is the long tube in your throat that carries food and liquid from your mouth to your stomach. The procedure is also called esophageal dilation. You may need this procedure if you have a buildup of scar tissue in your esophagus that makes it difficult, painful, or even impossible to swallow. This can be caused by gastroesophageal reflux disease (GERD). In rare cases, people need this procedure because they have cancer of the esophagus or a problem with the way food moves through the esophagus. Sometimes you may need to have another  dilatation to enlarge the opening of the esophagus gradually. Tell a health care provider about:  Any allergies you have.  All medicines you are taking, including vitamins, herbs, eye drops, creams, and over-the-counter medicines.  Any problems you or family members have had with anesthetic medicines.  Any blood disorders you have.  Any surgeries you have had.  Any medical conditions you have.  Any antibiotic medicines you are required to take before dental procedures. What are the risks? Generally, this is a safe procedure. However, problems can occur and include:  Bleeding from a tear in the lining of the esophagus.  A hole (perforation) in the esophagus.  What happens before the procedure?  Do not eat or drink anything after midnight on the night before the procedure or as directed by your health care provider.  Ask your health care provider about changing or stopping your regular medicines. This is especially important if you are taking diabetes medicines or blood thinners.  Plan to have someone take you home after the procedure. What happens during the procedure?  You will be given a medicine that makes you relaxed and sleepy (sedative).  A medicine may be sprayed or gargled to numb the back of the throat.  Your health care provider can use various instruments to do an esophageal dilatation. During the procedure, the instrument used will be placed in your mouth and passed down into your esophagus. Options include: ? Simple dilators. This instrument is carefully placed in the esophagus to stretch it. ? Guided wire bougies. In this method, a flexible tube (endoscope) is used to insert a wire into the esophagus. The dilator is passed over this wire to enlarge the esophagus. Then the wire is removed. ? Balloon dilators. An endoscope with a small balloon at the end is passed down into the esophagus. Inflating the balloon gently stretches the esophagus and opens it up. What  happens after the procedure?  Your blood pressure, heart rate, breathing rate, and blood oxygen level will be monitored often until the medicines you were given have worn off.  Your throat may feel slightly sore and will probably still feel numb. This will improve slowly over time.  You will not be allowed to eat or drink until the throat numbness has resolved.  If this is a same-day procedure, you may be allowed to go home once you have been able to drink, urinate, and sit on the edge of the bed without nausea or dizziness.  If this is a same-day procedure, you should have a friend or family member with you for the next 24  hours after the procedure. This information is not intended to replace advice given to you by your health care provider. Make sure you discuss any questions you have with your health care provider. Document Released: 10/02/2005 Document Revised: 01/17/2016 Document Reviewed: 12/21/2013 Elsevier Interactive Patient Education  [employer].  Colonoscopy, Adult A colonoscopy is an exam to look at the large intestine. It is done to check for problems, such as:  Lumps (tumors).  Growths (polyps).  Swelling (inflammation).  Bleeding.  What happens before the procedure? Eating and drinking Follow instructions from your doctor about eating and drinking. These instructions may include:  A few days before the procedure - follow a low-fiber diet. ? Avoid nuts. ? Avoid seeds. ? Avoid dried fruit. ? Avoid raw fruits. ? Avoid vegetables.  1-3 days before the procedure - follow a clear liquid diet. Avoid liquids that have red or purple dye. Drink only clear liquids, such as: ? Clear broth or bouillon. ? Black coffee or tea. ? Clear juice. ? Clear soft drinks or sports drinks. ? Gelatin dessert. ? Popsicles.  On the day of the procedure - do not eat or drink anything during the 2 hours before the procedure.  Bowel prep If you were prescribed an oral bowel  prep:  Take it as told by your doctor. Starting the day before your procedure, you will need to drink a lot of liquid. The liquid will cause you to poop (have bowel movements) until your poop is almost clear or light green.  If your skin or butt gets irritated from diarrhea, you may: ? Wipe the area with wipes that have medicine in them, such as adult wet wipes with aloe and vitamin E. ? Put something on your skin that soothes the area, such as petroleum jelly.  If you throw up (vomit) while drinking the bowel prep, take a break for up to 60 minutes. Then begin the bowel prep again. If you keep throwing up and you cannot take the bowel prep without throwing up, call your doctor.  General instructions  Ask your doctor about changing or stopping your normal medicines. This is important if you take diabetes medicines or blood thinners.  Plan to have someone take you home from the hospital or clinic. What happens during the procedure?  An IV tube may be put into one of your veins.  You will be given medicine to help you relax (sedative).  To reduce your risk of infection: ? Your doctors will wash their hands. ? Your anal area will be washed with soap.  You will be asked to lie on your side with your knees bent.  Your doctor will get a long, thin, flexible tube ready. The tube will have a camera and a light on the end.  The tube will be put into your anus.  The tube will be gently put into your large intestine.  Air will be delivered into your large intestine to keep it open. You may feel some pressure or cramping.  The camera will be used to take photos.  A small tissue sample may be removed from your body to be looked at under a microscope (biopsy). If any possible problems are found, the tissue will be sent to a lab for testing.  If small growths are found, your doctor may remove them and have them checked for cancer.  The tube that was put into your anus will be slowly  removed. The procedure may vary among doctors and hospitals.  What happens after the procedure?  Your doctor will check on you often until the medicines you were given have worn off.  Do not drive for 24 hours after the procedure.  You may have a small amount of blood in your poop.  You may pass gas.  You may have mild cramps or bloating in your belly (abdomen).  It is up to you to get the results of your procedure. Ask your doctor, or the department performing the procedure, when your results will be ready. This information is not intended to replace advice given to you by your health care provider. Make sure you discuss any questions you have with your health care provider. Document Released: 09/13/2010 Document Revised: 06/11/2016 Document Reviewed: 10/23/2015 Elsevier Interactive Patient Education  2017 [employer]  Colonoscopy, Adult, Care After This sheet gives you information about how to care for yourself after your procedure. Your health care provider may also give you more specific instructions. If you have problems or questions, contact your health care provider. What can I expect after the procedure? After the procedure, it is common to have:  A small amount of blood in your stool for 24 hours after the procedure.  Some gas.  Mild abdominal cramping or bloating.  Follow these instructions at home: General instructions   For the first 24 hours after the procedure: ? Do not drive or use machinery. ? Do not sign important documents. ? Do not drink alcohol. ? Do your regular daily activities at a slower pace than normal. ? Eat soft, easy-to-digest foods. ? Rest often.  Take over-the-counter or prescription medicines only as told by your health care provider.  It is up to you to get the results of your procedure. Ask your health care provider, or the department performing the procedure, when your results will be ready. Relieving cramping and bloating  Try  walking around when you have cramps or feel bloated.  Apply heat to your abdomen as told by your health care provider. Use a heat source that your health care provider recommends, such as a moist heat pack or a heating pad. ? Place a towel between your skin and the heat source. ? Leave the heat on for 20-30 minutes. ? Remove the heat if your skin turns bright red. This is especially important if you are unable to feel pain, heat, or cold. You may have a greater risk of getting burned. Eating and drinking  Drink enough fluid to keep your urine clear or pale yellow.  Resume your normal diet as instructed by your health care provider. Avoid heavy or fried foods that are hard to digest.  Avoid drinking alcohol for as long as instructed by your health care provider. Contact a health care provider if:  You have blood in your stool 2-3 days after the procedure. Get help right away if:  You have more than a small spotting of blood in your stool.  You pass large blood clots in your stool.  Your abdomen is swollen.  You have nausea or vomiting.  You have a fever.  You have increasing abdominal pain that is not relieved with medicine. This information is not intended to replace advice given to you by your health care provider. Make sure you discuss any questions you have with your health care provider. Document Released: 03/25/2004 Document Revised: 05/05/2016 Document Reviewed: 10/23/2015 Elsevier Interactive Patient Education  2018 [place] Anesthesia is a term that refers to techniques, procedures, and  medicines that help a person stay safe and comfortable during a medical procedure. Monitored anesthesia care, or sedation, is one type of anesthesia. Your anesthesia specialist may recommend sedation if you will be having a procedure that does not require you to be unconscious, such as:  Cataract surgery.  A dental procedure.  A biopsy.  A  colonoscopy.  During the procedure, you may receive a medicine to help you relax (sedative). There are three levels of sedation:  Mild sedation. At this level, you may feel awake and relaxed. You will be able to follow directions.  Moderate sedation. At this level, you will be sleepy. You may not remember the procedure.  Deep sedation. At this level, you will be asleep. You will not remember the procedure.  The more medicine you are given, the deeper your level of sedation will be. Depending on how you respond to the procedure, the anesthesia specialist may change your level of sedation or the type of anesthesia to fit your needs. An anesthesia specialist will monitor you closely during the procedure. Let your health care provider know about:  Any allergies you have.  All medicines you are taking, including vitamins, herbs, eye drops, creams, and over-the-counter medicines.  Any use of steroids (by mouth or as a cream).  Any problems you or family members have had with sedatives and anesthetic medicines.  Any blood disorders you have.  Any surgeries you have had.  Any medical conditions you have, such as sleep apnea.  Whether you are pregnant or may be pregnant.  Any use of cigarettes, alcohol, or street drugs. What are the risks? Generally, this is a safe procedure. However, problems may occur, including:  Getting too much medicine (oversedation).  Nausea.  Allergic reaction to medicines.  Trouble breathing. If this happens, a breathing tube may be used to help with breathing. It will be removed when you are awake and breathing on your own.  Heart trouble.  Lung trouble.  Before the procedure Staying hydrated Follow instructions from your health care provider about hydration, which may include:  Up to 2 hours before the procedure - you may continue to drink clear liquids, such as water, clear fruit juice, black coffee, and plain tea.  Eating and drinking  restrictions Follow instructions from your health care provider about eating and drinking, which may include:  8 hours before the procedure - stop eating heavy meals or foods such as meat, fried foods, or fatty foods.  6 hours before the procedure - stop eating light meals or foods, such as toast or cereal.  6 hours before the procedure - stop drinking milk or drinks that contain milk.  2 hours before the procedure - stop drinking clear liquids.  Medicines Ask your health care provider about:  Changing or stopping your regular medicines. This is especially important if you are taking diabetes medicines or blood thinners.  Taking medicines such as aspirin and ibuprofen. These medicines can thin your blood. Do not take these medicines before your procedure if your health care provider instructs you not to.  Tests and exams  You will have a physical exam.  You may have blood tests done to show: ? How well your kidneys and liver are working. ? How well your blood can clot.  General instructions  Plan to have someone take you home from the hospital or clinic.  If you will be going home right after the procedure, plan to have someone with you for 24 hours.  What  happens during the procedure?  Your blood pressure, heart rate, breathing, level of pain and overall condition will be monitored.  An IV tube will be inserted into one of your veins.  Your anesthesia specialist will give you medicines as needed to keep you comfortable during the procedure. This may mean changing the level of sedation.  The procedure will be performed. After the procedure  Your blood pressure, heart rate, breathing rate, and blood oxygen level will be monitored until the medicines you were given have worn off.  Do not drive for 24 hours if you received a sedative.  You may: ? Feel sleepy, clumsy, or nauseous. ? Feel forgetful about what happened after the procedure. ? Have a sore throat if you had a  breathing tube during the procedure. ? Vomit. This information is not intended to replace advice given to you by your health care provider. Make sure you discuss any questions you have with your health care provider. Document Released: 05/07/2005 Document Revised: 01/18/2016 Document Reviewed: 12/02/2015 Elsevier Interactive Patient Education  2018 [place], Care After These instructions provide you with information about caring for yourself after your procedure. Your health care provider may also give you more specific instructions. Your treatment has been planned according to current medical practices, but problems sometimes occur. Call your health care provider if you have any problems or questions after your procedure. What can I expect after the procedure? After your procedure, it is common to:  Feel sleepy for several hours.  Feel clumsy and have poor balance for several hours.  Feel forgetful about what happened after the procedure.  Have poor judgment for several hours.  Feel nauseous or vomit.  Have a sore throat if you had a breathing tube during the procedure.  Follow these instructions at home: For at least 24 hours after the procedure:   Do not: ? Participate in activities in which you could fall or become injured. ? Drive. ? Use heavy machinery. ? Drink alcohol. ? Take sleeping pills or medicines that cause drowsiness. ? Make important decisions or sign legal documents. ? Take care of children on your own.  Rest. Eating and drinking  Follow the diet that is recommended by your health care provider.  If you vomit, drink water, juice, or soup when you can drink without vomiting.  Make sure you have little or no nausea before eating solid foods. General instructions  Have a responsible adult stay with you until you are awake and alert.  Take over-the-counter and prescription medicines only as told by your health care  provider.  If you smoke, do not smoke without supervision.  Keep all follow-up visits as told by your health care provider. This is important. Contact a health care provider if:  You keep feeling nauseous or you keep vomiting.  You feel light-headed.  You develop a rash.  You have a fever. Get help right away if:  You have trouble breathing. This information is not intended to replace advice given to you by your health care provider. Make sure you discuss any questions you have with your health care provider. Document Released: 12/02/2015 Document Revised: 04/02/2016 Document Reviewed: 12/02/2015 Elsevier Interactive Patient Education  [employer].

## 2018-06-25 DIAGNOSIS — G4733 Obstructive sleep apnea (adult) (pediatric): Secondary | ICD-10-CM | POA: Diagnosis not present

## 2018-06-30 ENCOUNTER — Other Ambulatory Visit: Payer: Self-pay

## 2018-06-30 ENCOUNTER — Encounter (HOSPITAL_COMMUNITY): Payer: Self-pay

## 2018-06-30 ENCOUNTER — Encounter (HOSPITAL_COMMUNITY)
Admission: RE | Admit: 2018-06-30 | Discharge: 2018-06-30 | Disposition: A | Discharge status: Home / Self Care | Payer: Medicare Other | Source: Ambulatory Visit | Attending: Internal Medicine | Admitting: Internal Medicine

## 2018-06-30 DIAGNOSIS — Z01818 Encounter for other preprocedural examination: Secondary | ICD-10-CM | POA: Diagnosis not present

## 2018-06-30 HISTORY — DX: Personal history of urinary calculi: Z87.442

## 2018-06-30 HISTORY — DX: Anemia, unspecified: D64.9

## 2018-06-30 LAB — CBC WITH DIFFERENTIAL/PLATELET
Abs Immature Granulocytes: 0.02 10*3/uL (ref 0.00–0.07)
BASOS ABS: 0.1 10*3/uL (ref 0.0–0.1)
BASOS PCT: 1 %
Eosinophils Absolute: 0.2 10*3/uL (ref 0.0–0.5)
Eosinophils Relative: 3 %
HCT: 37.7 % — ABNORMAL LOW (ref 39.0–52.0)
Hemoglobin: 11.7 g/dL — ABNORMAL LOW (ref 13.0–17.0)
Immature Granulocytes: 0 %
LYMPHS PCT: 36 %
Lymphs Abs: 2.4 10*3/uL (ref 0.7–4.0)
MCH: 30.4 pg (ref 26.0–34.0)
MCHC: 31 g/dL (ref 30.0–36.0)
MCV: 97.9 fL (ref 80.0–100.0)
MONO ABS: 0.6 10*3/uL (ref 0.1–1.0)
Monocytes Relative: 9 %
NEUTROS PCT: 51 %
NRBC: 0 % (ref 0.0–0.2)
Neutro Abs: 3.4 10*3/uL (ref 1.7–7.7)
PLATELETS: 281 10*3/uL (ref 150–400)
RBC: 3.85 MIL/uL — AB (ref 4.22–5.81)
RDW: 12.3 % (ref 11.5–15.5)
WBC: 6.7 10*3/uL (ref 4.0–10.5)

## 2018-06-30 LAB — BASIC METABOLIC PANEL
Anion gap: 8 (ref 5–15)
BUN: 19 mg/dL (ref 8–23)
CO2: 25 mmol/L (ref 22–32)
CREATININE: 0.9 mg/dL (ref 0.61–1.24)
Calcium: 9.3 mg/dL (ref 8.9–10.3)
Chloride: 108 mmol/L (ref 98–111)
Glucose, Bld: 136 mg/dL — ABNORMAL HIGH (ref 70–99)
Potassium: 4.4 mmol/L (ref 3.5–5.1)
SODIUM: 141 mmol/L (ref 135–145)

## 2018-07-05 ENCOUNTER — Encounter (HOSPITAL_COMMUNITY)
Admission: RE | Disposition: A | Discharge status: Home / Self Care | Payer: Self-pay | Source: Ambulatory Visit | Attending: Internal Medicine

## 2018-07-05 ENCOUNTER — Ambulatory Visit (HOSPITAL_COMMUNITY): Payer: Medicare Other | Admitting: Anesthesiology

## 2018-07-05 ENCOUNTER — Encounter (HOSPITAL_COMMUNITY): Payer: Self-pay

## 2018-07-05 ENCOUNTER — Ambulatory Visit (HOSPITAL_COMMUNITY)
Admission: RE | Admit: 2018-07-05 | Discharge: 2018-07-05 | Disposition: A | Discharge status: Home / Self Care | Payer: Medicare Other | Source: Ambulatory Visit | Attending: Internal Medicine | Admitting: Internal Medicine

## 2018-07-05 DIAGNOSIS — Z1211 Encounter for screening for malignant neoplasm of colon: Secondary | ICD-10-CM | POA: Insufficient documentation

## 2018-07-05 DIAGNOSIS — J449 Chronic obstructive pulmonary disease, unspecified: Secondary | ICD-10-CM | POA: Diagnosis not present

## 2018-07-05 DIAGNOSIS — K449 Diaphragmatic hernia without obstruction or gangrene: Secondary | ICD-10-CM | POA: Diagnosis not present

## 2018-07-05 DIAGNOSIS — E7800 Pure hypercholesterolemia, unspecified: Secondary | ICD-10-CM | POA: Diagnosis not present

## 2018-07-05 DIAGNOSIS — Z87442 Personal history of urinary calculi: Secondary | ICD-10-CM | POA: Insufficient documentation

## 2018-07-05 DIAGNOSIS — D120 Benign neoplasm of cecum: Secondary | ICD-10-CM | POA: Diagnosis not present

## 2018-07-05 DIAGNOSIS — R1319 Other dysphagia: Secondary | ICD-10-CM

## 2018-07-05 DIAGNOSIS — D123 Benign neoplasm of transverse colon: Secondary | ICD-10-CM | POA: Diagnosis not present

## 2018-07-05 DIAGNOSIS — F329 Major depressive disorder, single episode, unspecified: Secondary | ICD-10-CM | POA: Insufficient documentation

## 2018-07-05 DIAGNOSIS — I10 Essential (primary) hypertension: Secondary | ICD-10-CM | POA: Diagnosis not present

## 2018-07-05 DIAGNOSIS — R1310 Dysphagia, unspecified: Secondary | ICD-10-CM

## 2018-07-05 DIAGNOSIS — Z8546 Personal history of malignant neoplasm of prostate: Secondary | ICD-10-CM | POA: Insufficient documentation

## 2018-07-05 DIAGNOSIS — K219 Gastro-esophageal reflux disease without esophagitis: Secondary | ICD-10-CM | POA: Insufficient documentation

## 2018-07-05 DIAGNOSIS — Z79899 Other long term (current) drug therapy: Secondary | ICD-10-CM | POA: Insufficient documentation

## 2018-07-05 DIAGNOSIS — Z86010 Personal history of colonic polyps: Secondary | ICD-10-CM | POA: Diagnosis not present

## 2018-07-05 DIAGNOSIS — Z87891 Personal history of nicotine dependence: Secondary | ICD-10-CM | POA: Insufficient documentation

## 2018-07-05 DIAGNOSIS — G4730 Sleep apnea, unspecified: Secondary | ICD-10-CM | POA: Insufficient documentation

## 2018-07-05 DIAGNOSIS — K5730 Diverticulosis of large intestine without perforation or abscess without bleeding: Secondary | ICD-10-CM | POA: Insufficient documentation

## 2018-07-05 DIAGNOSIS — K222 Esophageal obstruction: Secondary | ICD-10-CM

## 2018-07-05 DIAGNOSIS — F4310 Post-traumatic stress disorder, unspecified: Secondary | ICD-10-CM | POA: Insufficient documentation

## 2018-07-05 HISTORY — PX: POLYPECTOMY: SHX5525

## 2018-07-05 HISTORY — PX: COLONOSCOPY WITH PROPOFOL: SHX5780

## 2018-07-05 HISTORY — PX: MALONEY DILATION: SHX5535

## 2018-07-05 HISTORY — PX: ESOPHAGOGASTRODUODENOSCOPY (EGD) WITH PROPOFOL: SHX5813

## 2018-07-05 SURGERY — COLONOSCOPY WITH PROPOFOL
Anesthesia: Monitor Anesthesia Care

## 2018-07-05 MED ORDER — PROPOFOL 10 MG/ML IV BOLUS
INTRAVENOUS | Status: AC
  Filled 2018-07-05: qty 60

## 2018-07-05 MED ORDER — CHLORHEXIDINE GLUCONATE CLOTH 2 % EX PADS: 6.0000 | MEDICATED_PAD | Freq: Once | CUTANEOUS | Status: DC

## 2018-07-05 MED ORDER — LACTATED RINGERS IV SOLN
INTRAVENOUS | Status: DC | PRN
  Administered 2018-07-05 (×2): via INTRAVENOUS

## 2018-07-05 MED ORDER — PROPOFOL 500 MG/50ML IV EMUL
INTRAVENOUS | Status: DC | PRN
  Administered 2018-07-05: 10:00:00 via INTRAVENOUS
  Administered 2018-07-05: 150 ug/kg/min via INTRAVENOUS

## 2018-07-05 MED ORDER — LACTATED RINGERS IV SOLN: INTRAVENOUS | Status: DC

## 2018-07-05 MED ORDER — MEPERIDINE HCL 100 MG/ML IJ SOLN: 6.2500 mg | INTRAMUSCULAR | Status: DC | PRN

## 2018-07-05 MED ORDER — MIDAZOLAM HCL 2 MG/2ML IJ SOLN
INTRAMUSCULAR | Status: AC
  Filled 2018-07-05: qty 2

## 2018-07-05 MED ORDER — HYDROMORPHONE HCL 1 MG/ML IJ SOLN: 0.2500 mg | INTRAMUSCULAR | Status: DC | PRN

## 2018-07-05 MED ORDER — STERILE WATER FOR IRRIGATION IR SOLN
Status: DC | PRN
  Administered 2018-07-05: 100 mL

## 2018-07-05 MED ORDER — HYDROCODONE-ACETAMINOPHEN 7.5-325 MG PO TABS: 1.0000 | ORAL_TABLET | Freq: Once | ORAL | Status: DC | PRN

## 2018-07-05 MED ORDER — MIDAZOLAM HCL 5 MG/5ML IJ SOLN
INTRAMUSCULAR | Status: DC | PRN
  Administered 2018-07-05: 2 mg via INTRAVENOUS

## 2018-07-05 MED ORDER — PROMETHAZINE HCL 25 MG/ML IJ SOLN: 6.2500 mg | INTRAMUSCULAR | Status: DC | PRN

## 2018-07-05 MED ORDER — PROPOFOL 10 MG/ML IV BOLUS
INTRAVENOUS | Status: DC | PRN
  Administered 2018-07-05 (×7): 20 mg via INTRAVENOUS

## 2018-07-05 NOTE — Transfer of Care (Signed)
Immediate Anesthesia Transfer of Care Note  Patient: [name]  Procedure(s) Performed: COLONOSCOPY WITH PROPOFOL (N/A ) ESOPHAGOGASTRODUODENOSCOPY (EGD) WITH PROPOFOL (N/A ) MALONEY DILATION (N/A ) POLYPECTOMY  Patient Location: PACU  Anesthesia Type:MAC  Level of Consciousness: awake  Airway & Oxygen Therapy: Patient Spontanous Breathing  Post-op Assessment: Report given to RN  Post vital signs: Reviewed  Last Vitals:  Vitals Value Taken Time  BP    Temp    Pulse    Resp    SpO2      Last Pain:  Vitals:   07/05/18 1014  TempSrc:   PainSc: 0-No pain         Complications: No apparent anesthesia complications

## 2018-07-05 NOTE — H&P (Signed)
@LOGO @   Primary Care Physician:  Redmond School, MD Primary Gastroenterologist:  Dr. Gala Romney  Pre-Procedure History & Physical: HPI:  [name] is a 62 y.o. male here for further evaluation of dysphagia via EGD with ED.  History of colonic polyps here for surveillance colonoscopy.  Past Medical History:  Diagnosis Date  . Anemia   . Anxiety   . Arthritis   . Bladder stone   . Cancer [facility]) 2007   prostate CA  . COPD (chronic obstructive pulmonary disease) ([place])   . Decreased pulse 08/16/2008   LE doppler - bilateral ABIs normal, bilateral PVRs normal waveform; bilateral common iliac arteries difficult to visualize however velocities suggest less than 50% diameter reduction; bilateral LE normal velocities w/o evidence of diameter reduction  . Depression   . GERD (gastroesophageal reflux disease)   . History of kidney stones   . Hypercholesteremia   . Hypertension   . Mental disorder    PTSD  . Shortness of breath   . Sleep apnea    wears CPAP nightly, settings at 14   . Tobacco abuse   . Urinary incontinence    due to prostate removal    Past Surgical History:  Procedure Laterality Date  . ANTERIOR LAT LUMBAR FUSION N/A 11/17/2012   Procedure: XLIF L3 - L4 POSTERIOR L3 -L4 FUSION INSTRUMENTATION/L2 - L3 DECOMPRESSION (REVISION OF POSTERIOR HARDWARE) 1 LEVEL;  Surgeon: Melina Schools, MD;  Location: [facility];  Service: Orthopedics;  Laterality: N/A;  procedure #1: start 0923, end 1057.   [name] BACK SURGERY  2010 / 2014  . BLADDER SUSPENSION     x 2  . CARDIAC CATHETERIZATION     no PCI  . CARDIOVASCULAR STRESS TEST  08/16/2008   EF 57%; inferior region has diaphragmatic attenuation, otherwise normal perfusion w/o evidence of ischemia or infarct; LV normal in size; no scintographic evidence of inducible ischemia; no ECG changes, negative for ischemia; low risk scan  . CERVICAL FUSION    . COLON RESECTION N/A 07/12/2013   peritoneal biopsy only, evidence of metastatic prostate  cancer  . COLONOSCOPY    . COLONOSCOPY N/A 05/27/2013   RMR: Extrinsic mass effect at the level of the appendiceal orifice likely represents an appendiceal or periappendiceal process. Neovascular changes of the rectal mucosa consistent with prior history of radiation treatment. Multiple 3-5 mm polyps removed, several tubular adenomas. Diverticulosis.nex tcs 2019  . CYSTOSCOPY W/ URETERAL STENT PLACEMENT N/A 10/03/2013   Procedure: CYSTOSCOPY AND REMOVAL OF BLADDER NECK STONE ;  Surgeon: Dutch Gray, MD;  Location: [facility];  Service: Urology;  Laterality: N/A;  WITH REMOVAL OF BLADDER NECK STONE    . ESOPHAGOGASTRODUODENOSCOPY (EGD) WITH ESOPHAGEAL DILATION N/A 05/27/2013   RMR: Mild erosive reflux esophagitis with soft noncritical appearing stricture status post Maloney dilation, antral erosions with reactive gastropathy but no H. pylori  . HOLMIUM LASER APPLICATION N/A 3/0/0923   Procedure: HOLMIUM LASER APPLICATION;  Surgeon: Dutch Gray, MD;  Location: [facility];  Service: Urology;  Laterality: N/A;  . PROSTATE SURGERY    . TRANSTHORACIC ECHOCARDIOGRAM  08/16/2008   essentially normal for age  . URINARY SPHINCTER IMPLANT N/A 01/31/2014   Procedure: IMPLANTATION ARTIFICIAL SPHINCTER  WITH CYSTOSCOPY;  Surgeon: Reece Packer, MD;  Location: [facility];  Service: Urology;  Laterality: N/A;  . WRIST SURGERY Right     Prior to Admission medications   Medication Sig Start Date End Date Taking? Authorizing Provider  ARIPiprazole (ABILIFY) 10 MG tablet Take  10 mg by mouth daily. 04/13/18  Yes [provider]  Calcium Carb-Cholecalciferol (CALCIUM 1000 + D PO) Take 1 tablet by mouth 2 (two) times daily.    Yes [provider]  Choline Fenofibrate (TRILIPIX) 135 MG capsule Take 135 mg by mouth daily.   Yes [provider]  Coenzyme Q10 (CO Q 10) 100 MG CAPS Take 100 mg by mouth daily.    Yes [provider]  desipramine (NOPRAMIN) 100 MG tablet Take 100 mg by mouth at  bedtime.  09/04/14  Yes [provider]  enalapril (VASOTEC) 10 MG tablet Take 10 mg by mouth every morning.    Yes [provider]  ferrous sulfate 325 (65 FE) MG tablet Take 325 mg by mouth daily.  06/30/14  Yes [provider]  Javier Docker Oil 300 MG CAPS Take 300 mg by mouth daily.   Yes [provider]  leuprolide (LUPRON) 30 MG injection Inject 30 mg into the muscle every 3 (three) months.   Yes [provider]  megestrol (MEGACE) 20 MG tablet Take 20 mg by mouth daily.  10/04/14  Yes [provider]  meloxicam (MOBIC) 7.5 MG tablet Take 7.5 mg by mouth daily.  08/04/14  Yes [provider]  Multiple Vitamin (MULTIVITAMIN WITH MINERALS) TABS tablet Take 1 tablet by mouth daily.   Yes [provider]  pantoprazole (PROTONIX) 40 MG tablet Take 1 tablet (40 mg total) by mouth daily. 30 minutes before breakfast 05/21/18  Yes Annitta Needs, NP  simvastatin (ZOCOR) 40 MG tablet Take 40 mg by mouth every evening.   Yes [provider]  venlafaxine XR (EFFEXOR-XR) 75 MG 24 hr capsule Take 75 mg by mouth daily.  10/03/14  Yes [provider]    Allergies as of 05/21/2018 - Review Complete 05/21/2018  Allergen Reaction Noted  . Penicillins Anaphylaxis 09/30/2011    Family History  Problem Relation Age of Onset  . Diabetes Mother   . Hypertension Mother   . Heart disease Mother   . Diabetes Father   . Hypertension Father   . Colon polyps Father        age 51  . Hypertension Brother   . Diabetes Brother   . Colon cancer Neg Hx     Social History   Socioeconomic History  . Marital status: Married    Spouse name: Not on file  . Number of children: Not on file  . Years of education: Not on file  . Highest education level: Not on file  Occupational History  . 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 Financial resource strain: Not on file  . Food insecurity:    Worry: Not on file     Inability: Not on file  . Transportation needs:    Medical: Not on file    Non-medical: Not on file  Tobacco Use  . Smoking status: Former Smoker    Packs/day: 0.25    Years: 43.00    Pack years: 10.75    Types: Cigarettes    Last attempt to quit: 02/28/2013    Years since quitting: 5.3  . Smokeless tobacco: Former Systems developer    Quit date: 06/30/1980  Substance and Sexual Activity  . Alcohol use: No  . Drug use: No  . Sexual activity: Yes    Birth control/protection: None  Lifestyle  . Physical activity:    Days per week: Not on file    Minutes  per session: Not on file  . Stress: Not on file  Relationships  . Social connections:    Talks on phone: Not on file    Gets together: Not on file    Attends religious service: Not on file    Active member of club or organization: Not on file    Attends meetings of clubs or organizations: Not on file    Relationship status: Not on file  . Intimate partner violence:    Fear of current or ex partner: Not on file    Emotionally abused: Not on file    Physically abused: Not on file    Forced sexual activity: Not on file  Other Topics Concern  . Not on file  Social History Narrative  . Not on file    Review of Systems: See HPI, otherwise negative ROS  Physical Exam: BP 133/88   Pulse 74   Temp 98.4 F (36.9 C) (Oral)   Resp 18   Ht 6\' 2"  (1.88 m)   Wt 121.6 kg   SpO2 94%   BMI 34.41 kg/m  General:   Alert,  Well-developed, well-nourished, pleasant and cooperative in NAD Neck:  Supple; no masses or thyromegaly. No significant cervical adenopathy. Lungs:  Clear throughout to auscultation.   No wheezes, crackles, or rhonchi. No acute distress. Heart:  Regular rate and rhythm; no murmurs, clicks, rubs,  or gallops. Abdomen: Non-distended, normal bowel sounds.  Soft and nontender without appreciable mass or hepatosplenomegaly.  Pulses:  Normal pulses noted. Extremities:  Without clubbing or edema.  Impression/Plan: 63 year old  gentleman with long-standing GERD recurrent esophageal dysphagia.  History of esophageal stricture.  History of colonic adenoma.  Here for EGD with EGD and surveillance colonoscopy per plan.  The risks, benefits, limitations, imponderables and alternatives regarding both EGD and colonoscopy have been reviewed with the patient. Questions have been answered. All parties agreeable.      Notice: This dictation was prepared with Dragon dictation along with smaller phrase technology. Any transcriptional errors that result from this process are unintentional and may not be corrected upon review.

## 2018-07-05 NOTE — Op Note (Signed)
[facility] Patient [name] Procedure Date: 07/05/2018 9:15 AM MRN: [id number] Date of Birth: 19-Nov-1955 Attending MD: Norvel Richards , MD CSN: [id number] Age: 62 Admit Type: Outpatient Procedure:                Upper GI endoscopy Indications:              Dysphagia Providers:                Norvel Richards, MD, Rosina Lowenstein, RN, Randa Spike, Technician Referring MD:              Medicines:                Propofol per Anesthesia Complications:            No immediate complications. Estimated Blood Loss:     Estimated blood loss: none. Procedure:                Pre-Anesthesia Assessment:                           - Prior to the procedure, a History and Physical                            was performed, and patient medications and                            allergies were reviewed. The patient's tolerance of                            previous anesthesia was also reviewed. The risks                            and benefits of the procedure and the sedation                            options and risks were discussed with the patient.                            All questions were answered, and informed consent                            was obtained. Prior Anticoagulants: The patient has                            taken no previous anticoagulant or antiplatelet                            agents. ASA Grade Assessment: II - A patient with                            mild systemic disease. After reviewing the risks  and benefits, the patient was deemed in                            satisfactory condition to undergo the procedure.                           After obtaining informed consent, the endoscope was                            passed under direct vision. Throughout the                            procedure, the patient's blood pressure, pulse, and                            oxygen saturations were  monitored continuously. The                            GIF-[id number] ([id number]) scope was introduced through the                            and advanced to the second part of duodenum. Scope In: 9:37:51 AM Scope Out: 9:43:40 AM Total Procedure Duration: 0 hours 5 minutes 49 seconds  Findings:      A mild Schatzki ring was found at the gastroesophageal junction.      A small hiatal hernia was present.      The stomach was normal.      The duodenal bulb and second portion of the duodenum were normal.       Biopsies were taken with a cold forceps for histology. The scope was       withdrawn. Dilation was performed with a Maloney dilator with mild       resistance at 56 Fr. The dilation site was examined and showed no       change. Estimated blood loss: none. Impression:               - Mild Schatzki ring. Dilated.                           - Small hiatal hernia.                           - Normal stomach.                           - Normal duodenal bulb and second portion of the                            duodenum. Biopsied. Moderate Sedation:      Moderate (conscious) sedation was personally administered by an       anesthesia professional. The following parameters were monitored: oxygen       saturation, heart rate, blood pressure, respiratory rate, EKG, adequacy       of pulmonary ventilation, and response to care. Recommendation:           - Patient has a contact number available for  emergencies. The signs and symptoms of potential                            delayed complications were discussed with the                            patient. Return to normal activities tomorrow.                            Written discharge instructions were provided to the                            patient.                           - Resume previous diet.                           - Continue present medications.                           - Await pathology results.                            - No repeat upper endoscopy.                           - Return to GI office in 6 months. See colonoscopy                            report. Procedure Code(s):        --- Professional ---                           [phone], Esophagogastroduodenoscopy, flexible,                            transoral; with biopsy, single or multiple                           43450, Dilation of esophagus, by unguided sound or                            bougie, single or multiple passes Diagnosis Code(s):        --- Professional ---                           K22.2, Esophageal obstruction                           K44.9, Diaphragmatic hernia without obstruction or                            gangrene                           R13.10, Dysphagia, unspecified CPT copyright 2018 [employer]. All rights reserved.  The codes documented in this report are preliminary and upon coder review may  be revised to meet current compliance requirements. Cristopher Estimable. Abdoul Encinas, MD Norvel Richards, MD 07/05/2018 10:13:47 AM This report has been signed electronically. Number of Addenda: 0

## 2018-07-05 NOTE — Discharge Instructions (Signed)
Colonoscopy Discharge Instructions  Read the instructions outlined below and refer to this sheet in the next few weeks. These discharge instructions provide you with general information on caring for yourself after you leave the hospital. Your doctor may also give you specific instructions. While your treatment has been planned according to the most current medical practices available, unavoidable complications occasionally occur. If you have any problems or questions after discharge, call Dr. Gala Romney at [phone]. ACTIVITY  You may resume your regular activity, but move at a slower pace for the next 24 hours.   Take frequent rest periods for the next 24 hours.   Walking will help get rid of the air and reduce the bloated feeling in your belly (abdomen).   No driving for 24 hours (because of the medicine (anesthesia) used during the test).    Do not sign any important legal documents or operate any machinery for 24 hours (because of the anesthesia used during the test).  NUTRITION  Drink plenty of fluids.   You may resume your normal diet as instructed by your doctor.   Begin with a light meal and progress to your normal diet. Heavy or fried foods are harder to digest and may make you feel sick to your stomach (nauseated).   Avoid alcoholic beverages for 24 hours or as instructed.  MEDICATIONS  You may resume your normal medications unless your doctor tells you otherwise.  WHAT YOU CAN EXPECT TODAY  Some feelings of bloating in the abdomen.   Passage of more gas than usual.   Spotting of blood in your stool or on the toilet paper.  IF YOU HAD POLYPS REMOVED DURING THE COLONOSCOPY:  No aspirin products for 7 days or as instructed.   No alcohol for 7 days or as instructed.   Eat a soft diet for the next 24 hours.  FINDING OUT THE RESULTS OF YOUR TEST Not all test results are available during your visit. If your test results are not back during the visit, make an appointment  with your caregiver to find out the results. Do not assume everything is normal if you have not heard from your caregiver or the medical facility. It is important for you to follow up on all of your test results.  SEEK IMMEDIATE MEDICAL ATTENTION IF:  You have more than a spotting of blood in your stool.   Your belly is swollen (abdominal distention).   You are nauseated or vomiting.   You have a temperature over 101.   You have abdominal pain or discomfort that is severe or gets worse throughout the day.    EGD Discharge instructions Please read the instructions outlined below and refer to this sheet in the next few weeks. These discharge instructions provide you with general information on caring for yourself after you leave the hospital. Your doctor may also give you specific instructions. While your treatment has been planned according to the most current medical practices available, unavoidable complications occasionally occur. If you have any problems or questions after discharge, please call your doctor. ACTIVITY  You may resume your regular activity but move at a slower pace for the next 24 hours.   Take frequent rest periods for the next 24 hours.   Walking will help expel (get rid of) the air and reduce the bloated feeling in your abdomen.   No driving for 24 hours (because of the anesthesia (medicine) used during the test).   You may shower.   Do not sign  any important legal documents or operate any machinery for 24 hours (because of the anesthesia used during the test).  NUTRITION  Drink plenty of fluids.   You may resume your normal diet.   Begin with a light meal and progress to your normal diet.   Avoid alcoholic beverages for 24 hours or as instructed by your caregiver.  MEDICATIONS  You may resume your normal medications unless your caregiver tells you otherwise.  WHAT YOU CAN EXPECT TODAY  You may experience abdominal discomfort such as a feeling of  fullness or gas pains.  FOLLOW-UP  Your doctor will discuss the results of your test with you.  SEEK IMMEDIATE MEDICAL ATTENTION IF ANY OF THE FOLLOWING OCCUR:  Excessive nausea (feeling sick to your stomach) and/or vomiting.   Severe abdominal pain and distention (swelling).   Trouble swallowing.   Temperature over 101 F (37.8 C).   Rectal bleeding or vomiting of blood.   Gastroesophageal Reflux Disease, Adult Normally, food travels down the esophagus and stays in the stomach to be digested. If a person has gastroesophageal reflux disease (GERD), food and stomach acid move back up into the esophagus. When this happens, the esophagus becomes sore and swollen (inflamed). Over time, GERD can make small holes (ulcers) in the lining of the esophagus. Follow these instructions at home: Diet  Follow a diet as told by your doctor. You may need to avoid foods and drinks such as: ? Coffee and tea (with or without caffeine). ? Drinks that contain alcohol. ? Energy drinks and sports drinks. ? Carbonated drinks or sodas. ? Chocolate and cocoa. ? Peppermint and mint flavorings. ? Garlic and onions. ? Horseradish. ? Spicy and acidic foods, such as peppers, chili powder, curry powder, vinegar, hot sauces, and BBQ sauce. ? Citrus fruit juices and citrus fruits, such as oranges, lemons, and limes. ? Tomato-based foods, such as red sauce, chili, salsa, and pizza with red sauce. ? Fried and fatty foods, such as donuts, french fries, potato chips, and high-fat dressings. ? High-fat meats, such as hot dogs, rib eye steak, sausage, ham, and bacon. ? High-fat dairy items, such as whole milk, butter, and cream cheese.  Eat small meals often. Avoid eating large meals.  Avoid drinking large amounts of liquid with your meals.  Avoid eating meals during the 2-3 hours before bedtime.  Avoid lying down right after you eat.  Do not exercise right after you eat. General instructions  Pay  attention to any changes in your symptoms.  Take over-the-counter and prescription medicines only as told by your doctor. Do not take aspirin, ibuprofen, or other NSAIDs unless your doctor says it is okay.  Do not use any tobacco products, including cigarettes, chewing tobacco, and e-cigarettes. If you need help quitting, ask your doctor.  Wear loose clothes. Do not wear anything tight around your waist.  Raise (elevate) the head of your bed about 6 inches (15 cm).  Try to lower your stress. If you need help doing this, ask your doctor.  If you are overweight, lose an amount of weight that is healthy for you. Ask your doctor about a safe weight loss goal.  Keep all follow-up visits as told by your doctor. This is important. Contact a doctor if:  You have new symptoms.  You lose weight and you do not know why it is happening.  You have trouble swallowing, or it hurts to swallow.  You have wheezing or a cough that keeps happening.  Your symptoms  do not get better with treatment.  You have a hoarse voice. Get help right away if:  You have pain in your arms, neck, jaw, teeth, or back.  You feel sweaty, dizzy, or light-headed.  You have chest pain or shortness of breath.  You throw up (vomit) and your throw up looks like blood or coffee grounds.  You pass out (faint).  Your poop (stool) is bloody or black.  You cannot swallow, drink, or eat. This information is not intended to replace advice given to you by your health care provider. Make sure you discuss any questions you have with your health care provider. Document Released: 01/28/2008 Document Revised: 01/17/2016 Document Reviewed: 12/06/2014 Elsevier Interactive Patient Education  2018 [employer].  Colon Polyps Polyps are tissue growths inside the body. Polyps can grow in many places, including the large intestine (colon). A polyp may be a round bump or a mushroom-shaped growth. You could have one polyp or  several. Most colon polyps are noncancerous (benign). However, some colon polyps can become cancerous over time. What are the causes? The exact cause of colon polyps is not known. What increases the risk? This condition is more likely to develop in people who:  Have a family history of colon cancer or colon polyps.  Are older than 58 or older than 45 if they are African American.  Have inflammatory bowel disease, such as ulcerative colitis or Crohn disease.  Are overweight.  Smoke cigarettes.  Do not get enough exercise.  Drink too much alcohol.  Eat a diet that is: ? High in fat and red meat. ? Low in fiber.  Had childhood cancer that was treated with abdominal radiation.  What are the signs or symptoms? Most polyps do not cause symptoms. If you have symptoms, they may include:  Blood coming from your rectum when having a bowel movement.  Blood in your stool.The stool may look dark red or black.  A change in bowel habits, such as constipation or diarrhea.  How is this diagnosed? This condition is diagnosed with a colonoscopy. This is a procedure that uses a lighted, flexible scope to look at the inside of your colon. How is this treated? Treatment for this condition involves removing any polyps that are found. Those polyps will then be tested for cancer. If cancer is found, your health care provider will talk to you about options for colon cancer treatment. Follow these instructions at home: Diet  Eat plenty of fiber, such as fruits, vegetables, and whole grains.  Eat foods that are high in calcium and vitamin D, such as milk, cheese, yogurt, eggs, liver, fish, and broccoli.  Limit foods high in fat, red meats, and processed meats, such as hot dogs, sausage, bacon, and lunch meats.  Maintain a healthy weight, or lose weight if recommended by your health care provider. General instructions  Do not smoke cigarettes.  Do not drink alcohol excessively.  Keep all  follow-up visits as told by your health care provider. This is important. This includes keeping regularly scheduled colonoscopies. Talk to your health care provider about when you need a colonoscopy.  Exercise every day or as told by your health care provider. Contact a health care provider if:  You have new or worsening bleeding during a bowel movement.  You have new or increased blood in your stool.  You have a change in bowel habits.  You unexpectedly lose weight. This information is not intended to replace advice given to you by your  health care provider. Make sure you discuss any questions you have with your health care provider. Document Released: 05/07/2004 Document Revised: 01/17/2016 Document Reviewed: 07/02/2015 Elsevier Interactive Patient Education  [employer].  Diverticulosis Diverticulosis is a condition that develops when small pouches (diverticula) form in the wall of the large intestine (colon). The colon is where water is absorbed and stool is formed. The pouches form when the inside layer of the colon pushes through weak spots in the outer layers of the colon. You may have a few pouches or many of them. What are the causes? The cause of this condition is not known. What increases the risk? The following factors may make you more likely to develop this condition:  Being older than age 52. Your risk for this condition increases with age. Diverticulosis is rare among people younger than age 32. By age 69, many people have it.  Eating a low-fiber diet.  Having frequent constipation.  Being overweight.  Not getting enough exercise.  Smoking.  Taking over-the-counter pain medicines, like aspirin and ibuprofen.  Having a family history of diverticulosis.  What are the signs or symptoms? In most people, there are no symptoms of this condition. If you do have symptoms, they may include:  Bloating.  Cramps in the abdomen.  Constipation or  diarrhea.  Pain in the lower left side of the abdomen.  How is this diagnosed? This condition is most often diagnosed during an exam for other colon problems. Because diverticulosis usually has no symptoms, it often cannot be diagnosed independently. This condition may be diagnosed by:  Using a flexible scope to examine the colon (colonoscopy).  Taking an X-ray of the colon after dye has been put into the colon (barium enema).  Doing a CT scan.  How is this treated? You may not need treatment for this condition if you have never developed an infection related to diverticulosis. If you have had an infection before, treatment may include:  Eating a high-fiber diet. This may include eating more fruits, vegetables, and grains.  Taking a fiber supplement.  Taking a live bacteria supplement (probiotic).  Taking medicine to relax your colon.  Taking antibiotic medicines.  Follow these instructions at home:  Drink 6-8 glasses of water or more each day to prevent constipation.  Try not to strain when you have a bowel movement.  If you have had an infection before: ? Eat more fiber as directed by your health care provider or your diet and nutrition specialist (dietitian). ? Take a fiber supplement or probiotic, if your health care provider approves.  Take over-the-counter and prescription medicines only as told by your health care provider.  If you were prescribed an antibiotic, take it as told by your health care provider. Do not stop taking the antibiotic even if you start to feel better.  Keep all follow-up visits as told by your health care provider. This is important. Contact a health care provider if:  You have pain in your abdomen.  You have bloating.  You have cramps.  You have not had a bowel movement in 3 days. Get help right away if:  Your pain gets worse.  Your bloating becomes very bad.  You have a fever or chills, and your symptoms suddenly get  worse.  You vomit.  You have bowel movements that are bloody or black.  You have bleeding from your rectum. Summary  Diverticulosis is a condition that develops when small pouches (diverticula) form in the wall of  the large intestine (colon).  You may have a few pouches or many of them.  This condition is most often diagnosed during an exam for other colon problems.  If you have had an infection related to diverticulosis, treatment may include increasing the fiber in your diet, taking supplements, or taking medicines. This information is not intended to replace advice given to you by your health care provider. Make sure you discuss any questions you have with your health care provider. Document Released: 05/08/2004 Document Revised: 06/30/2016 Document Reviewed: 06/30/2016 Elsevier Interactive Patient Education  2017 [employer]    GERD, colon polyp and diverticulosis information provided  Continue Protonix 40 mg daily  Further recommendations to follow pending review of pathology report

## 2018-07-05 NOTE — Anesthesia Preprocedure Evaluation (Addendum)
Anesthesia Evaluation    Airway Mallampati: II       Dental  (+) Edentulous Upper, Poor Dentition   Pulmonary shortness of breath, sleep apnea , COPD, former smoker,     + decreased breath sounds      Cardiovascular hypertension, On Medications  Rhythm:regular     Neuro/Psych PSYCHIATRIC DISORDERS Anxiety Depression    GI/Hepatic GERD  Medicated,  Endo/Other  diabetesMorbid obesity  Renal/GU      Musculoskeletal   Abdominal   Peds  Hematology  (+) Blood dyscrasia, anemia ,   Anesthesia Other Findings   Reproductive/Obstetrics                             Anesthesia Physical Anesthesia Plan  ASA: III  Anesthesia Plan: MAC   Post-op Pain Management:    Induction:   PONV Risk Score and Plan:   Airway Management Planned:   Additional Equipment:   Intra-op Plan:   Post-operative Plan:   Informed Consent:   Plan Discussed with: Anesthesiologist  Anesthesia Plan Comments:        Anesthesia Quick Evaluation

## 2018-07-05 NOTE — Anesthesia Postprocedure Evaluation (Signed)
Anesthesia Post Note  Patient: [name]  Procedure(s) Performed: COLONOSCOPY WITH PROPOFOL (N/A ) ESOPHAGOGASTRODUODENOSCOPY (EGD) WITH PROPOFOL (N/A ) MALONEY DILATION (N/A ) POLYPECTOMY  Patient location during evaluation: PACU Anesthesia Type: MAC Level of consciousness: awake and alert and oriented Pain management: pain level controlled Vital Signs Assessment: post-procedure vital signs reviewed and stable Respiratory status: spontaneous breathing Cardiovascular status: blood pressure returned to baseline and stable Postop Assessment: no apparent nausea or vomiting Anesthetic complications: no     Last Vitals:  Vitals:   07/05/18 0848  BP: 133/88  Pulse: 74  Resp: 18  Temp: 36.9 C  SpO2: 94%    Last Pain:  Vitals:   07/05/18 1014  TempSrc:   PainSc: 0-No pain                 Rhylen Pulido

## 2018-07-05 NOTE — Op Note (Signed)
[facility] Patient [name] Procedure Date: 07/05/2018 9:47 AM MRN: [id number] Date of Birth: Feb 07, 1956 Attending MD: Norvel Richards , MD CSN: [id number] Age: 62 Admit Type: Outpatient Procedure:                Colonoscopy Indications:              High risk colon cancer surveillance: Personal                            history of colonic polyps Providers:                Norvel Richards, MD, Rosina Lowenstein, RN, Randa Spike, Technician Referring MD:              Medicines:                Propofol per Anesthesia Complications:            No immediate complications. Estimated Blood Loss:     Estimated blood loss was minimal. Procedure:                Pre-Anesthesia Assessment:                           - Prior to the procedure, a History and Physical                            was performed, and patient medications and                            allergies were reviewed. The patient's tolerance of                            previous anesthesia was also reviewed. The risks                            and benefits of the procedure and the sedation                            options and risks were discussed with the patient.                            All questions were answered, and informed consent                            was obtained. Prior Anticoagulants: The patient has                            taken no previous anticoagulant or antiplatelet                            agents. ASA Grade Assessment: II - A patient with  mild systemic disease. After reviewing the risks                            and benefits, the patient was deemed in                            satisfactory condition to undergo the procedure.                           After obtaining informed consent, the colonoscope                            was passed under direct vision. Throughout the                            procedure, the  patient's blood pressure, pulse, and                            oxygen saturations were monitored continuously. The                            [id number]-[id number] ([id number]) scope was introduced through                            the and advanced to the the cecum, identified by                            appendiceal orifice and ileocecal valve. The                            colonoscopy was performed without difficulty. The                            patient tolerated the procedure well. The quality                            of the bowel preparation was adequate. Scope In: 9:50:07 AM Scope Out: 10:12:41 AM Scope Withdrawal Time: 0 hours 17 minutes 25 seconds  Total Procedure Duration: 0 hours 22 minutes 34 seconds  Findings:      The perianal and digital rectal examinations were normal.      Five sessile polyps were found in the splenic flexure and cecum. The       polyps were 4 to 6 mm in size. These polyps were removed with a cold       snare. Resection and retrieval were complete. Estimated blood loss was       minimal.      Scattered small-mouthed diverticula were found in the sigmoid colon and       descending colon.      The exam was otherwise without abnormality on direct and retroflexion       views. Impression:               - Five 4 to 6 mm polyps at the splenic flexure and  in the cecum, removed with a cold snare. Resected                            and retrieved.                           - Mild diverticulosis in the sigmoid colon and in                            the descending colon.                           - The examination was otherwise normal on direct                            and retroflexion views. Moderate Sedation:      Moderate (conscious) sedation was personally administered by an       anesthesia professional. The following parameters were monitored: oxygen       saturation, heart rate, blood pressure, respiratory rate, EKG, adequacy        of pulmonary ventilation, and response to care. Recommendation:           - Patient has a contact number available for                            emergencies. The signs and symptoms of potential                            delayed complications were discussed with the                            patient. Return to normal activities tomorrow.                            Written discharge instructions were provided to the                            patient.                           - Resume previous diet.                           - Continue present medications.                           - Repeat colonoscopy date to be determined after                            pending pathology results are reviewed for                            surveillance based on pathology results.                           - Return to GI office after studies are complete.  See EGD report Procedure Code(s):        --- Professional ---                           [phone], Colonoscopy, flexible; with removal of                            tumor(s), polyp(s), or other lesion(s) by snare                            technique Diagnosis Code(s):        --- Professional ---                           Z86.010, Personal history of colonic polyps                           D12.3, Benign neoplasm of transverse colon (hepatic                            flexure or splenic flexure)                           D12.0, Benign neoplasm of cecum                           K57.30, Diverticulosis of large intestine without                            perforation or abscess without bleeding CPT copyright 2018 [employer]. All rights reserved. The codes documented in this report are preliminary and upon coder review may  be revised to meet current compliance requirements. Cristopher Estimable. Mellonie Guess, MD Norvel Richards, MD 07/05/2018 10:17:25 AM This report has been signed electronically. Number of Addenda: 0

## 2018-07-07 ENCOUNTER — Encounter: Payer: Self-pay | Admitting: Internal Medicine

## 2018-07-09 ENCOUNTER — Encounter (HOSPITAL_COMMUNITY): Payer: Self-pay | Admitting: Internal Medicine

## 2018-07-16 DIAGNOSIS — G4733 Obstructive sleep apnea (adult) (pediatric): Secondary | ICD-10-CM | POA: Diagnosis not present

## 2018-08-02 DIAGNOSIS — E119 Type 2 diabetes mellitus without complications: Secondary | ICD-10-CM | POA: Diagnosis not present

## 2018-08-13 DIAGNOSIS — E119 Type 2 diabetes mellitus without complications: Secondary | ICD-10-CM | POA: Diagnosis not present

## 2018-08-13 DIAGNOSIS — Z1389 Encounter for screening for other disorder: Secondary | ICD-10-CM | POA: Diagnosis not present

## 2018-08-13 DIAGNOSIS — I10 Essential (primary) hypertension: Secondary | ICD-10-CM | POA: Diagnosis not present

## 2018-08-15 DIAGNOSIS — G4733 Obstructive sleep apnea (adult) (pediatric): Secondary | ICD-10-CM | POA: Diagnosis not present

## 2018-08-30 ENCOUNTER — Encounter (HOSPITAL_COMMUNITY)
Admission: RE | Admit: 2018-08-30 | Discharge: 2018-08-30 | Disposition: A | Discharge status: Home / Self Care | Payer: Medicare Other | Source: Ambulatory Visit | Attending: Urology | Admitting: Urology

## 2018-08-30 DIAGNOSIS — C61 Malignant neoplasm of prostate: Secondary | ICD-10-CM | POA: Diagnosis not present

## 2018-09-15 DIAGNOSIS — G4733 Obstructive sleep apnea (adult) (pediatric): Secondary | ICD-10-CM | POA: Diagnosis not present

## 2018-10-22 DIAGNOSIS — G4733 Obstructive sleep apnea (adult) (pediatric): Secondary | ICD-10-CM | POA: Diagnosis not present

## 2018-12-08 DIAGNOSIS — C799 Secondary malignant neoplasm of unspecified site: Secondary | ICD-10-CM | POA: Diagnosis not present

## 2018-12-08 DIAGNOSIS — Z5111 Encounter for antineoplastic chemotherapy: Secondary | ICD-10-CM | POA: Diagnosis not present

## 2018-12-23 ENCOUNTER — Telehealth: Payer: Self-pay | Admitting: Cardiovascular Disease

## 2018-12-23 NOTE — Telephone Encounter (Signed)
[phone] smartphone/ my chart/ consent/ pre reg completed

## 2018-12-24 ENCOUNTER — Telehealth (INDEPENDENT_AMBULATORY_CARE_PROVIDER_SITE_OTHER): Payer: Medicare Other | Admitting: Cardiovascular Disease

## 2018-12-24 VITALS — BP 117/74 | HR 74 | Ht 74.0 in | Wt 260.0 lb

## 2018-12-24 DIAGNOSIS — E782 Mixed hyperlipidemia: Secondary | ICD-10-CM

## 2018-12-24 DIAGNOSIS — I10 Essential (primary) hypertension: Secondary | ICD-10-CM | POA: Diagnosis not present

## 2018-12-24 DIAGNOSIS — J449 Chronic obstructive pulmonary disease, unspecified: Secondary | ICD-10-CM | POA: Diagnosis not present

## 2018-12-24 DIAGNOSIS — D649 Anemia, unspecified: Secondary | ICD-10-CM

## 2018-12-24 DIAGNOSIS — E1169 Type 2 diabetes mellitus with other specified complication: Secondary | ICD-10-CM

## 2018-12-24 DIAGNOSIS — E669 Obesity, unspecified: Secondary | ICD-10-CM

## 2018-12-24 NOTE — Progress Notes (Signed)
Virtual Visit via Video Note   This visit type was conducted due to national recommendations for restrictions regarding the COVID-19 Pandemic (e.g. social distancing) in an effort to limit this patient's exposure and mitigate transmission in our community.  Due to his co-morbid illnesses, this patient is at least at moderate risk for complications without adequate follow up.  This format is felt to be most appropriate for this patient at this time.  All issues noted in this document were discussed and addressed.  A limited physical exam was performed with this format.  Please refer to the patient's chart for his consent to telehealth for [facility].   Date:  12/24/2018   ID:  [name], DOB 26-Feb-1956, MRN [id number]  Patient Location: Home Provider Location: Home  PCP:  Redmond School, MD  Cardiologist:  Jayli Fogleman Electrophysiologist:  None   Evaluation Performed:  Follow-Up Visit  Chief Complaint:  Fatigue  History of Present Illness:    [name] is a 63 y.o. male with multiple obesity related complications including type 2 diabetes mellitus, hypercholesterolemia, essential hypertension, obstructive sleep apnea, also with COPD related to previous smoking (quit 2014) and widespread metastatic prostate cancer that is well controlled on antiandrogen therapy. Angiography in 2011 showed normal coronaries.  He complains of feeling tired, but denies true dyspnea UTI rest or with activity, edema, chest pain, palpitations, intermittent claudication or focal neurological complaints.  He has not experienced lightheadedness or syncope.  He has chronic moderate low back pain.  He is having fewer hot flashes.  He denies daytime hypersomnolence and had a relatively recent CPAP titration and has new equipment.  He always uses his CPAP when he sleeps.  He is managed to lose about 8 pounds in the last 6 months and is now only mildly obese.  His most recent hemoglobin was from a year ago and  was 11.7.  He does not have cold intolerance or other obvious symptoms that would suggest hypothyroidism.  The patient does not have symptoms concerning for COVID-19 infection (fever, chills, cough, or new shortness of breath).    Past Medical History:  Diagnosis Date  . Anemia   . Anxiety   . Arthritis   . Bladder stone   . Cancer [facility]) 2007   prostate CA  . COPD (chronic obstructive pulmonary disease) ([place])   . Decreased pulse 08/16/2008   LE doppler - bilateral ABIs normal, bilateral PVRs normal waveform; bilateral common iliac arteries difficult to visualize however velocities suggest less than 50% diameter reduction; bilateral LE normal velocities w/o evidence of diameter reduction  . Depression   . GERD (gastroesophageal reflux disease)   . History of kidney stones   . Hypercholesteremia   . Hypertension   . Mental disorder    PTSD  . Shortness of breath   . Sleep apnea    wears CPAP nightly, settings at 14   . Tobacco abuse   . Urinary incontinence    due to prostate removal   Past Surgical History:  Procedure Laterality Date  . ANTERIOR LAT LUMBAR FUSION N/A 11/17/2012   Procedure: XLIF L3 - L4 POSTERIOR L3 -L4 FUSION INSTRUMENTATION/L2 - L3 DECOMPRESSION (REVISION OF POSTERIOR HARDWARE) 1 LEVEL;  Surgeon: Melina Schools, MD;  Location: [facility];  Service: Orthopedics;  Laterality: N/A;  procedure #1: start 0923, end 1057.   [name] BACK SURGERY  2010 / 2014  . BLADDER SUSPENSION     x 2  . CARDIAC CATHETERIZATION  no PCI  . CARDIOVASCULAR STRESS TEST  08/16/2008   EF 57%; inferior region has diaphragmatic attenuation, otherwise normal perfusion w/o evidence of ischemia or infarct; LV normal in size; no scintographic evidence of inducible ischemia; no ECG changes, negative for ischemia; low risk scan  . CERVICAL FUSION    . COLON RESECTION N/A 07/12/2013   peritoneal biopsy only, evidence of metastatic prostate cancer  . COLONOSCOPY    . COLONOSCOPY N/A 05/27/2013   RMR:  Extrinsic mass effect at the level of the appendiceal orifice likely represents an appendiceal or periappendiceal process. Neovascular changes of the rectal mucosa consistent with prior history of radiation treatment. Multiple 3-5 mm polyps removed, several tubular adenomas. Diverticulosis.nex tcs 2019  . COLONOSCOPY WITH PROPOFOL N/A 07/05/2018   Procedure: COLONOSCOPY WITH PROPOFOL;  Surgeon: Daneil Dolin, MD;  [facility];  Service: Endoscopy;  Laterality: N/A;  9:30am  . CYSTOSCOPY W/ URETERAL STENT PLACEMENT N/A 10/03/2013   Procedure: CYSTOSCOPY AND REMOVAL OF BLADDER NECK STONE ;  Surgeon: Dutch Gray, MD;  Location: [facility];  Service: Urology;  Laterality: N/A;  WITH REMOVAL OF BLADDER NECK STONE    . ESOPHAGOGASTRODUODENOSCOPY (EGD) WITH ESOPHAGEAL DILATION N/A 05/27/2013   RMR: Mild erosive reflux esophagitis with soft noncritical appearing stricture status post Maloney dilation, antral erosions with reactive gastropathy but no H. pylori  . ESOPHAGOGASTRODUODENOSCOPY (EGD) WITH PROPOFOL N/A 07/05/2018   Procedure: ESOPHAGOGASTRODUODENOSCOPY (EGD) WITH PROPOFOL;  Surgeon: Daneil Dolin, MD;  [facility];  Service: Endoscopy;  Laterality: N/A;  . HOLMIUM LASER APPLICATION N/A 12/31/5275   Procedure: HOLMIUM LASER APPLICATION;  Surgeon: Dutch Gray, MD;  Location: [facility];  Service: Urology;  Laterality: N/A;  . Venia Minks DILATION N/A 07/05/2018   Procedure: Venia Minks DILATION;  Surgeon: Daneil Dolin, MD;  [facility];  Service: Endoscopy;  Laterality: N/A;  . POLYPECTOMY  07/05/2018   Procedure: POLYPECTOMY;  Surgeon: Daneil Dolin, MD;  [facility];  Service: Endoscopy;;  cecal polyp , splenic flexure polyps x4  . PROSTATE SURGERY    . TRANSTHORACIC ECHOCARDIOGRAM  08/16/2008   essentially normal for age  . URINARY SPHINCTER IMPLANT N/A 01/31/2014   Procedure: IMPLANTATION ARTIFICIAL SPHINCTER  WITH CYSTOSCOPY;  Surgeon: Reece Packer, MD;   Location: [facility];  Service: Urology;  Laterality: N/A;  . WRIST SURGERY Right      Current Meds  Medication Sig  . ARIPiprazole (ABILIFY) 10 MG tablet Take 10 mg by mouth daily.  . Calcium Carb-Cholecalciferol (CALCIUM 1000 + D PO) Take 1 tablet by mouth 2 (two) times daily.   . Choline Fenofibrate (TRILIPIX) 135 MG capsule Take 135 mg by mouth daily.  . Coenzyme Q10 (CO Q 10) 100 MG CAPS Take 100 mg by mouth daily.   [name] desipramine (NOPRAMIN) 100 MG tablet Take 100 mg by mouth at bedtime.   . enalapril (VASOTEC) 10 MG tablet Take 10 mg by mouth every morning.   . enzalutamide (XTANDI) 40 MG capsule Take 160 mg by mouth daily. Takes 4 capsules daily  . ferrous sulfate 325 (65 FE) MG tablet Take 325 mg by mouth daily.   Javier Docker Oil 300 MG CAPS Take 300 mg by mouth daily.  [name] leuprolide (LUPRON) 30 MG injection Inject 30 mg into the muscle every 3 (three) months.  . megestrol (MEGACE) 20 MG tablet Take 20 mg by mouth daily.   . Multiple Vitamin (MULTIVITAMIN WITH MINERALS) TABS tablet Take 1 tablet  by mouth daily.  . pantoprazole (PROTONIX) 40 MG tablet Take 1 tablet (40 mg total) by mouth daily. 30 minutes before breakfast  . simvastatin (ZOCOR) 40 MG tablet Take 40 mg by mouth every evening.  . venlafaxine XR (EFFEXOR-XR) 75 MG 24 hr capsule Take 75 mg by mouth daily.      Allergies:   Penicillins   Social History   Tobacco Use  . Smoking status: Former Smoker    Packs/day: 0.25    Years: 43.00    Pack years: 10.75    Types: Cigarettes    Last attempt to quit: 02/28/2013    Years since quitting: 5.8  . Smokeless tobacco: Former Systems developer    Quit date: 06/30/1980  Substance Use Topics  . Alcohol use: No  . Drug use: No     Family Hx: The patient's family history includes Colon polyps in his father; Diabetes in his brother, father, and mother; Heart disease in his mother; Hypertension in his brother, father, and mother. There is no history of Colon cancer.  ROS:   Please see the  history of present illness.     All other systems reviewed and are negative.   Prior CV studies:   The following studies were reviewed today:  Labs/Other Tests and Data Reviewed:    EKG:  An ECG dated 06/30/2018 was personally reviewed today and demonstrated:  Normal sinus rhythm with mild generalized reduction in voltage  Recent Labs: 06/30/2018: BUN 19; Creatinine, Ser 0.90; Hemoglobin 11.7; Platelets 281; Potassium 4.4; Sodium 141 hemoglobin A1c 6.5%  December 02, 2018 potassium 3.5, creatinine 0.5, normal liver function tests, PSA 0.67  Recent Lipid Panel December 02, 2017 LDL 101, HDL 31, cholesterol 159, triglycerides 134, TSH 1.78  Wt Readings from Last 3 Encounters:  12/24/18 260 lb (117.9 kg)  07/05/18 268 lb (121.6 kg)  06/30/18 268 lb (121.6 kg)     Objective:    Vital Signs:  BP 117/74   Pulse 74   Ht 6\' 2"  (1.88 m)   Wt 260 lb (117.9 kg)   BMI 33.38 kg/m    VITAL SIGNS:  reviewed GEN:  no acute distress EYES:  sclerae anicteric, EOMI - Extraocular Movements Intact RESPIRATORY:  normal respiratory effort, symmetric expansion CARDIOVASCULAR:  no peripheral edema SKIN:  no rash, lesions or ulcers. MUSCULOSKELETAL:  no obvious deformities. NEURO:  alert and oriented x 3, no obvious focal deficit PSYCH:  normal affect Mildly obese  ASSESSMENT & PLAN:    1. HTN: Excellent control. 2. HLP: He is due a repeat lipid profile.  He has lost some weight since the last assessment 1 year ago and hopefully this will be associated with improvement in HDL cholesterol which has always been low. 3. DM: Well-controlled A1c 6.5% 6 months ago and he has lost weight since then. 4. OSA: Reports compliance with therapy and improvement in symptoms after adjustment of CPAP settings by Dr. Claiborne Billings about a year ago. 5. Fatigue: Moderately associated with hypersomnolence.  Could be a side effect of treatment with antiandrogens and Xtandi.  Could be worsening anemia.  6. Anemia: Had normal B12  folate and high ferritin levels when checked about a year ago.  Recheck hemoglobin when we do his labs. 7. Mild obesity: He is with a progress is made with weight loss and encouraged him to continue his efforts.  COVID-19 Education: The signs and symptoms of COVID-19 were discussed with the patient and how to seek care for testing (follow up with  PCP or arrange E-visit).  The importance of social distancing was discussed today.  Time:   Today, I have spent 17 minutes with the patient with telehealth technology discussing the above problems.     Medication Adjustments/Labs and Tests Ordered: Current medicines are reviewed at length with the patient today.  Concerns regarding medicines are outlined above.   Tests Ordered: No orders of the defined types were placed in this encounter.   Medication Changes: No orders of the defined types were placed in this encounter.   Disposition:  Follow up 12 months  Signed, Sanda Klein, MD  12/24/2018 8:24 AM    Cut Off

## 2018-12-24 NOTE — Patient Instructions (Signed)
Medication Instructions:  Continue same medications If you need a refill on your cardiac medications before your next appointment, please call your pharmacy.   Lab work: Lipid panel,cmet,cbc  You can done when Almyra Free goes for lab first of June  Nothing to eat or drink after midnight before lab   Testing/Procedures: None ordered  Follow-Up: At [facility], you and your health needs are our priority.  As part of our continuing mission to provide you with exceptional heart care, we have created designated Provider Care Teams.  These Care Teams include your primary Cardiologist (physician) and Advanced Practice Providers (APPs -  Physician Assistants and Nurse Practitioners) who all work together to provide you with the care you need, when you need it. . Schedule follow up appointment with Dr.Croitoru in 12 months  Call 3 months before to schedule

## 2018-12-29 DIAGNOSIS — G4733 Obstructive sleep apnea (adult) (pediatric): Secondary | ICD-10-CM | POA: Diagnosis not present

## 2019-01-12 DIAGNOSIS — E782 Mixed hyperlipidemia: Secondary | ICD-10-CM | POA: Diagnosis not present

## 2019-01-12 DIAGNOSIS — I10 Essential (primary) hypertension: Secondary | ICD-10-CM | POA: Diagnosis not present

## 2019-01-12 DIAGNOSIS — Z0001 Encounter for general adult medical examination with abnormal findings: Secondary | ICD-10-CM | POA: Diagnosis not present

## 2019-01-12 DIAGNOSIS — Z1389 Encounter for screening for other disorder: Secondary | ICD-10-CM | POA: Diagnosis not present

## 2019-01-24 ENCOUNTER — Telehealth: Payer: Self-pay | Admitting: Cardiovascular Disease

## 2019-01-24 DIAGNOSIS — I10 Essential (primary) hypertension: Secondary | ICD-10-CM | POA: Diagnosis not present

## 2019-01-24 DIAGNOSIS — E1169 Type 2 diabetes mellitus with other specified complication: Secondary | ICD-10-CM | POA: Diagnosis not present

## 2019-01-24 DIAGNOSIS — E782 Mixed hyperlipidemia: Secondary | ICD-10-CM | POA: Diagnosis not present

## 2019-01-24 DIAGNOSIS — J449 Chronic obstructive pulmonary disease, unspecified: Secondary | ICD-10-CM | POA: Diagnosis not present

## 2019-01-24 DIAGNOSIS — Z1389 Encounter for screening for other disorder: Secondary | ICD-10-CM | POA: Diagnosis not present

## 2019-01-24 DIAGNOSIS — E119 Type 2 diabetes mellitus without complications: Secondary | ICD-10-CM | POA: Diagnosis not present

## 2019-01-24 DIAGNOSIS — Z0001 Encounter for general adult medical examination with abnormal findings: Secondary | ICD-10-CM | POA: Diagnosis not present

## 2019-01-24 DIAGNOSIS — Z129 Encounter for screening for malignant neoplasm, site unspecified: Secondary | ICD-10-CM | POA: Diagnosis not present

## 2019-01-24 LAB — LIPID PANEL

## 2019-01-24 NOTE — Telephone Encounter (Signed)
°  smartphone/ verbal consent/ my chart active/ pre reg completed  °

## 2019-01-25 ENCOUNTER — Telehealth: Payer: Self-pay | Admitting: *Deleted

## 2019-01-25 DIAGNOSIS — I10 Essential (primary) hypertension: Secondary | ICD-10-CM

## 2019-01-25 DIAGNOSIS — E782 Mixed hyperlipidemia: Secondary | ICD-10-CM

## 2019-01-25 LAB — CBC WITH DIFFERENTIAL/PLATELET
Basophils Absolute: 0.1 10*3/uL (ref 0.0–0.2)
Basos: 1 %
EOS (ABSOLUTE): 0 10*3/uL (ref 0.0–0.4)
Eos: 0 %
Hematocrit: 41.7 % (ref 37.5–51.0)
Hemoglobin: 13.5 g/dL (ref 13.0–17.7)
Immature Grans (Abs): 0 10*3/uL (ref 0.0–0.1)
Immature Granulocytes: 0 %
Lymphocytes Absolute: 2.5 10*3/uL (ref 0.7–3.1)
Lymphs: 34 %
MCH: 32 pg (ref 26.6–33.0)
MCHC: 32.4 g/dL (ref 31.5–35.7)
MCV: 99 fL — ABNORMAL HIGH (ref 79–97)
Monocytes Absolute: 0.7 10*3/uL (ref 0.1–0.9)
Monocytes: 9 %
Neutrophils Absolute: 3.9 10*3/uL (ref 1.4–7.0)
Neutrophils: 56 %
Platelets: 420 10*3/uL (ref 150–450)
RBC: 4.22 x10E6/uL (ref 4.14–5.80)
RDW: 12.6 % (ref 11.6–15.4)
WBC: 7.1 10*3/uL (ref 3.4–10.8)

## 2019-01-25 LAB — COMPREHENSIVE METABOLIC PANEL
ALT: 15 IU/L (ref 0–44)
AST: 15 IU/L (ref 0–40)
Albumin/Globulin Ratio: 2.2 (ref 1.2–2.2)
Albumin: 5.1 g/dL — ABNORMAL HIGH (ref 3.8–4.8)
Alkaline Phosphatase: 51 IU/L (ref 39–117)
BUN/Creatinine Ratio: 21 (ref 10–24)
BUN: 21 mg/dL (ref 8–27)
Bilirubin Total: 0.2 mg/dL (ref 0.0–1.2)
CO2: 23 mmol/L (ref 20–29)
Calcium: 10.5 mg/dL — ABNORMAL HIGH (ref 8.6–10.2)
Chloride: 102 mmol/L (ref 96–106)
Creatinine, Ser: 1.02 mg/dL (ref 0.76–1.27)
GFR calc Af Amer: 90 mL/min/{1.73_m2} (ref >59)
GFR calc non Af Amer: 78 mL/min/{1.73_m2} (ref >59)
Globulin, Total: 2.3 g/dL (ref 1.5–4.5)
Glucose: 121 mg/dL — ABNORMAL HIGH (ref 65–99)
Potassium: 4.7 mmol/L (ref 3.5–5.2)
Sodium: 140 mmol/L (ref 134–144)
Total Protein: 7.4 g/dL (ref 6.0–8.5)

## 2019-01-25 LAB — LIPID PANEL
Chol/HDL Ratio: 5.7 ratio — ABNORMAL HIGH (ref 0.0–5.0)
Cholesterol, Total: 212 mg/dL — ABNORMAL HIGH (ref 100–199)
HDL: 37 mg/dL — ABNORMAL LOW (ref >39)
LDL Calculated: 136 mg/dL — ABNORMAL HIGH (ref 0–99)
Triglycerides: 197 mg/dL — ABNORMAL HIGH (ref 0–149)
VLDL Cholesterol Cal: 39 mg/dL (ref 5–40)

## 2019-01-25 MED ORDER — ROSUVASTATIN CALCIUM 20 MG PO TABS: 20.0000 mg | ORAL_TABLET | Freq: Every day | ORAL | 3 refills | Status: DC

## 2019-01-25 NOTE — Telephone Encounter (Signed)
Patient made aware of results and verbalized understanding.  The patient will discontinue the calcium supplement and talk to his PCP about the future need for it. Repeat labs placed.  The patient stated that he sees Dr. Alinda Money at [facility].  The patient was agreeable to stopping the simvastatin and switching to rosuvastatin 20 mg daily. The medication has been sent [facility] per his request. Repeat labs ordered.

## 2019-01-25 NOTE — Telephone Encounter (Signed)
-----   Message from Sanda Klein, MD sent at 01/25/2019  2:52 PM EDT ----- Normal blood cell counts (anemia has resolved), liver and kidney tests. Blood glucose mildly high. Calcium level is high. I recommend stopping the Ca-vit D supplement he is taking, rechecking in one month and talking to Dr. Gerarda Fraction about whether or not he needs that supplement. Calcium can also be elevated for other reasons (prostate Ca is one) and I am sending copies of labs to Dr. McDiarmid as well. Does he also have an oncologist that he sees? Finally, his cholesterol remains quite high and his triglycerides are high as well, despite max commonly used dose of simvastatin and trilipix. I suggest replacing simvastatin with rosuvastatin 20 mg daily and rechecking in 3 months. Target LDL<100 due to DM.

## 2019-01-25 NOTE — Telephone Encounter (Signed)
Left a message for the patient to call back.  

## 2019-01-26 ENCOUNTER — Encounter: Payer: Self-pay | Admitting: Cardiovascular Disease

## 2019-01-26 ENCOUNTER — Telehealth (INDEPENDENT_AMBULATORY_CARE_PROVIDER_SITE_OTHER): Payer: Medicare Other | Admitting: Cardiovascular Disease

## 2019-01-26 VITALS — BP 116/71 | HR 81 | Ht 74.0 in | Wt 256.0 lb

## 2019-01-26 DIAGNOSIS — I10 Essential (primary) hypertension: Secondary | ICD-10-CM | POA: Diagnosis not present

## 2019-01-26 DIAGNOSIS — C61 Malignant neoplasm of prostate: Secondary | ICD-10-CM

## 2019-01-26 DIAGNOSIS — G4733 Obstructive sleep apnea (adult) (pediatric): Secondary | ICD-10-CM

## 2019-01-26 DIAGNOSIS — E669 Obesity, unspecified: Secondary | ICD-10-CM | POA: Diagnosis not present

## 2019-01-26 DIAGNOSIS — E782 Mixed hyperlipidemia: Secondary | ICD-10-CM | POA: Diagnosis not present

## 2019-01-26 NOTE — Patient Instructions (Addendum)
Medication Instructions:  The current medical regimen is effective;  continue present plan and medications.  If you need a refill on your cardiac medications before your next appointment, please call your pharmacy.    Follow-Up: At [facility], you and your health needs are our priority.  As part of our continuing mission to provide you with exceptional heart care, we have created designated Provider Care Teams.  These Care Teams include your primary Cardiologist (physician) and Advanced Practice Providers (APPs -  Physician Assistants and Nurse Practitioners) who all work together to provide you with the care you need, when you need it. You will need a follow up appointment in 12 months.  Please call our office 2 months in advance to schedule this appointment.  You may see Dr.Kelly or one of the following Advanced Practice Providers on your designated Care Team: Almyra Deforest, [place] . Fabian Sharp, PA-C  Any Other Special Instructions Will Be Listed Below (If Applicable). Changed start pressure from 4 to 8.

## 2019-01-26 NOTE — Progress Notes (Signed)
Virtual Visit via Video Note   This visit type was conducted due to national recommendations for restrictions regarding the COVID-19 Pandemic (e.g. social distancing) in an effort to limit this patient's exposure and mitigate transmission in our community.  Due to his co-morbid illnesses, this patient is at least at moderate risk for complications without adequate follow up.  This format is felt to be most appropriate for this patient at this time.  All issues noted in this document were discussed and addressed.  A limited physical exam was performed with this format.  Please refer to the patient's chart for his consent to telehealth for [facility].   Date:  01/26/2019   ID:  [name], DOB 10/18/1955, MRN [id number]  Patient Location: Home Provider Location: Home  PCP:  Redmond School, MD  Cardiologist:  Dr. Tasia Catchings, MD (sleep) Electrophysiologist:  None   Evaluation Performed:  Follow-Up Visit  Chief Complaint:  Sleep apnea  History of Present Illness:    CARDEN TEEL is a 63 y.o. male  Who has a history of hypertension, hyperlipidemia, COPD, type 2 diabetes mellitus, obesity, as well as metastatic prostate CA on anti-antigen therapy.  He has a history of obstructive sleep apnea that dates back to 2012.  He has an old Respironics unit which is an auto unit which is set at 4 -20.  He brought his machine with him to the office today.  He has not been followed by a sleep physician only saw Dr. Chancy Milroy once after initiation of treatment.  In 2012.  Recently, he has not been sleeping well.  He is not getting adequate pressures.  The machine tends to cut off at times.  A download reveals a 30 day sleep average of 8 hours and 30 minutes.  Days greater than 4 hours were 29 out of 30.  AHI was 3.0.  He has noticed breakthrough snoring.  He wakes up at least 3 times per night for nocturia.  He is unaware of restless legs.  He denies bruxism.  An Epworth Sleepiness Scale score was  calculated in the office today and this endorsed at 7 with a slight chance of dozing while sitting and reading, as a passenger in a car for an hour without a break, and while sitting quietly after lunch.  There is a moderate chance of dozing while watching television, and lying down to rest in the afternoon.  When I saw him for initial sleep evaluation in January 2019 his machine was started to malfunction and he was feeling that he was not getting adequate treatment.  Since it is been 7 to 8 years since his initial evaluation I recommended a repeat evaluation.  Apparently he had this done at [facility].  His diagnostic study on October 06, 2017 with mild sleep apnea overall with an AHI of 8.3, but his RDI was 14.9.  However, he had severe sleep apnea with rem sleep with an AHI 51.4 and significant oxygen desaturation to 81%.  He also had frequent periodic limb movement disorder of sleep.  On that night, he did not meet split night criteria and returned in March for CPAP titration.  This was an inadequate titration.  Consequently he was set up with a CPAP auto unit with a minimum pressure of 6 a maximum of 20.  He is 100% compliant since reinitiation December 14, 2017.  He is averaging 8 hours and 18 minutes of sleep per night.  AHI is  excellent at 1.2.  There was only few days with mask leak.  His 95th percentile pressure was 13.5 with maximum average pressure 15.5.  Typically he goes to bed arouind 10 p.m. and wakes up at 8 am.  He notices significant improvement with this new machine.  It is much quieter.  He is sleeping well.  He does not have any breakthrough snoring. He denies any issues with painful restless legs despite being documented to have increased limb movement disorder of sleep.  Since I last saw him in May 2019 he has continued to do well from a CPAP perspective.  I was able to a new download from Dec 27, 2018 through January 25, 2019.  He is 100% compliant.  Average sleep duration is 9  hours and 13 minutes.  CPAP is set at a pressure range of 10 to 20 cm of water.  AHI is excellent at 0.9.  There is no leak.  His 95th percentile pressure is 12.6 with a maximum average pressure at 14.4.  He has a ResMed F 20 fullface mask.  He admits to not being very active particularly during this COVID-19 pandemic.  He denies any chest pain PND orthopnea or palpitations.  He continues to be on therapy for prostate CA followed by Dr. Alinda Money.  He presents for evaluation.   The patient does not have symptoms concerning for COVID-19 infection (fever, chills, cough, or new shortness of breath).    Past Medical History:  Diagnosis Date   Anemia    Anxiety    Arthritis    Bladder stone    Cancer ([place]) 2007   prostate CA   COPD (chronic obstructive pulmonary disease) (HCC)    Decreased pulse 08/16/2008   LE doppler - bilateral ABIs normal, bilateral PVRs normal waveform; bilateral common iliac arteries difficult to visualize however velocities suggest less than 50% diameter reduction; bilateral LE normal velocities w/o evidence of diameter reduction   Depression    GERD (gastroesophageal reflux disease)    History of kidney stones    Hypercholesteremia    Hypertension    Mental disorder    PTSD   Shortness of breath    Sleep apnea    wears CPAP nightly, settings at 14    Tobacco abuse    Urinary incontinence    due to prostate removal   Past Surgical History:  Procedure Laterality Date   ANTERIOR LAT LUMBAR FUSION N/A 11/17/2012   Procedure: XLIF L3 - L4 POSTERIOR L3 -L4 FUSION INSTRUMENTATION/L2 - L3 DECOMPRESSION (REVISION OF POSTERIOR HARDWARE) 1 LEVEL;  Surgeon: Melina Schools, MD;  Location: [place];  Service: Orthopedics;  Laterality: N/A;  procedure #1: start 0923, end 1057.    BACK SURGERY  2010 / 2014   BLADDER SUSPENSION     x 2   CARDIAC CATHETERIZATION     no PCI   CARDIOVASCULAR STRESS TEST  08/16/2008   EF 57%; inferior region has diaphragmatic  attenuation, otherwise normal perfusion w/o evidence of ischemia or infarct; LV normal in size; no scintographic evidence of inducible ischemia; no ECG changes, negative for ischemia; low risk scan   CERVICAL FUSION     COLON RESECTION N/A 07/12/2013   peritoneal biopsy only, evidence of metastatic prostate cancer   COLONOSCOPY     COLONOSCOPY N/A 05/27/2013   RMR: Extrinsic mass effect at the level of the appendiceal orifice likely represents an appendiceal or periappendiceal process. Neovascular changes of the rectal mucosa consistent with prior history of radiation  treatment. Multiple 3-5 mm polyps removed, several tubular adenomas. Diverticulosis.nex tcs 2019   COLONOSCOPY WITH PROPOFOL N/A 07/05/2018   Procedure: COLONOSCOPY WITH PROPOFOL;  Surgeon: Daneil Dolin, MD;  [facility];  Service: Endoscopy;  Laterality: N/A;  9:30am   CYSTOSCOPY W/ URETERAL STENT PLACEMENT N/A 10/03/2013   Procedure: CYSTOSCOPY AND REMOVAL OF BLADDER NECK STONE ;  Surgeon: Dutch Gray, MD;  [facility];  Service: Urology;  Laterality: N/A;  WITH REMOVAL OF BLADDER NECK STONE     ESOPHAGOGASTRODUODENOSCOPY (EGD) WITH ESOPHAGEAL DILATION N/A 05/27/2013   RMR: Mild erosive reflux esophagitis with soft noncritical appearing stricture status post Maloney dilation, antral erosions with reactive gastropathy but no H. pylori   ESOPHAGOGASTRODUODENOSCOPY (EGD) WITH PROPOFOL N/A 07/05/2018   Procedure: ESOPHAGOGASTRODUODENOSCOPY (EGD) WITH PROPOFOL;  Surgeon: Daneil Dolin, MD;  [facility];  Service: Endoscopy;  Laterality: N/A;   HOLMIUM LASER APPLICATION N/A 02/25/4192   Procedure: HOLMIUM LASER APPLICATION;  Surgeon: Dutch Gray, MD;  [facility];  Service: Urology;  Laterality: N/A;   MALONEY DILATION N/A 07/05/2018   Procedure: Venia Minks DILATION;  Surgeon: Daneil Dolin, MD;  [facility];  Service: Endoscopy;  Laterality: N/A;   POLYPECTOMY  07/05/2018    Procedure: POLYPECTOMY;  Surgeon: Daneil Dolin, MD;  [facility];  Service: Endoscopy;;  cecal polyp , splenic flexure polyps x4   PROSTATE SURGERY     TRANSTHORACIC ECHOCARDIOGRAM  08/16/2008   essentially normal for age   URINARY SPHINCTER IMPLANT N/A 01/31/2014   Procedure: IMPLANTATION ARTIFICIAL SPHINCTER  WITH CYSTOSCOPY;  Surgeon: Reece Packer, MD;  [facility];  Service: Urology;  Laterality: N/A;   WRIST SURGERY Right      Current Meds  Medication Sig   ARIPiprazole (ABILIFY) 10 MG tablet Take 10 mg by mouth daily.   Choline Fenofibrate (TRILIPIX) 135 MG capsule Take 135 mg by mouth daily.   Coenzyme Q10 (CO Q 10) 100 MG CAPS Take 100 mg by mouth daily.    desipramine (NOPRAMIN) 100 MG tablet Take 100 mg by mouth at bedtime.    enalapril (VASOTEC) 10 MG tablet Take 10 mg by mouth every morning.    enzalutamide (XTANDI) 40 MG capsule Take 160 mg by mouth daily. Takes 4 capsules daily   ferrous sulfate 325 (65 FE) MG tablet Take 325 mg by mouth daily.    Krill Oil 300 MG CAPS Take 300 mg by mouth daily.   leuprolide (LUPRON) 30 MG injection Inject 30 mg into the muscle every 3 (three) months.   megestrol (MEGACE) 20 MG tablet Take 20 mg by mouth daily.    Multiple Vitamin (MULTIVITAMIN WITH MINERALS) TABS tablet Take 1 tablet by mouth daily.   pantoprazole (PROTONIX) 40 MG tablet Take 1 tablet (40 mg total) by mouth daily. 30 minutes before breakfast   rosuvastatin (CRESTOR) 20 MG tablet Take 1 tablet (20 mg total) by mouth daily.   venlafaxine XR (EFFEXOR-XR) 75 MG 24 hr capsule Take 75 mg by mouth daily.      Allergies:   Penicillins   Social History   Tobacco Use   Smoking status: Former Smoker    Packs/day: 0.25    Years: 43.00    Pack years: 10.75    Types: Cigarettes    Last attempt to quit: 02/28/2013    Years since quitting: 5.9   Smokeless tobacco: Former Systems developer    Quit date: 06/30/1980  Substance Use Topics  Alcohol  use: No   Drug use: No     Family Hx: The patient's family history includes Colon polyps in his father; Diabetes in his brother, father, and mother; Heart disease in his mother; Hypertension in his brother, father, and mother. There is no history of Colon cancer.  ROS:   Please see the history of present illness.    No fevers chills night sweats, No change in taste or sense of smell No wheezing or shortness of breath No chest pain, PND orthopnea.  No presyncope or syncope Positive for metastatic breast CA Positive for depression Positive for hyperlipidemia All other systems reviewed and are negative.   Prior CV studies:   The following studies were reviewed today:  A new download was obtained from May 4 through January 25, 2019: 100% compliance, average duration 9 hours 13 minutes.  CPAP auto 10 to 20 cm water pressure.  AHI 0.9.  No leak.  95th percentile pressure 12.6 and maximum 14.4 cm.  Labs/Other Tests and Data Reviewed:    EKG:  An ECG dated 01/13/2018 was personally reviewed today and demonstrated:  Normal sinus rhythm at 67 bpm.  No ectopy.  Normal intervals.  Recent Labs: 01/24/2019: ALT 15; BUN 21; Creatinine, Ser 1.02; Hemoglobin 13.5; Platelets 420; Potassium 4.7; Sodium 140   Recent Lipid Panel Lab Results  Component Value Date/Time   CHOL 212 (H) 01/24/2019 10:26 AM   TRIG 197 (H) 01/24/2019 10:26 AM   HDL 37 (L) 01/24/2019 10:26 AM   CHOLHDL 5.7 (H) 01/24/2019 10:26 AM   LDLCALC 136 (H) 01/24/2019 10:26 AM    Wt Readings from Last 3 Encounters:  01/26/19 256 lb (116.1 kg)  12/24/18 260 lb (117.9 kg)  07/05/18 268 lb (121.6 kg)     Objective:    Vital Signs:  BP 116/71    Pulse 81    Ht 6\' 2"  (1.88 m)    Wt 256 lb (116.1 kg)    BMI 32.87 kg/m    On exam he is well-developed and well-nourished in no acute distress. Mild weight loss Breathing is normal and nonlabored HEENT unchanged No neck vein distention No audible wheezing No chest wall tenderness  to his palpation Abdomen soft No edema No neurological changes Normal affect   ASSESSMENT & PLAN:    1. OSA on CPAP: He has been on CPAP therapy since 2012. Although his sleep apnea was mild overall, with REM sleep this was severe with an AHI of 51.4 associated with significant oxygen desaturation to a nadir of 81%.  He also had the order of sleep.  Over the past year I have changed his CPAP auto settings to its current setting of a range of 10 to 20 cm.  His most recent download confirms excellent compliance with 100% use and average sleep duration with CPAP therapy at 9 hours and 13 minutes.  AHI is excellent at 0.9.  There is no leak.  His 95th percentile pressure is 12.6 with a maximum pressure of 14.4.  He is tolerating his ResMed air fit F 20 fullface mask without any leak.  Due to his excellent parameters I would not make adjustments to his CPAP therapy presently. 2. Essential Hypertension: Well-controlled on enalapril 10 mg. 3. Obesity:  4. Mixed hyperlipidemia: On trilipex and rosuvastatin 5. Metastatic Prosate XB:[id number] by Dr. Alinda Money on [place] and Lupron  6. Depression: on Abilify, Effexor, and desipramine  COVID-19 Education: The signs and symptoms of COVID-19 were discussed with the patient and  how to seek care for testing (follow up with PCP or arrange E-visit).  The importance of social distancing was discussed today.  Time:   Today, I have spent 22 minutes with the patient with telehealth technology discussing the above problems.     Medication Adjustments/Labs and Tests Ordered: Current medicines are reviewed at length with the patient today.  Concerns regarding medicines are outlined above.   Tests Ordered: No orders of the defined types were placed in this encounter.   Medication Changes: No orders of the defined types were placed in this encounter.   Disposition:  Follow up one year  Signed, Shelva Majestic, MD  01/26/2019 8:49 AM    [place]

## 2019-02-08 ENCOUNTER — Other Ambulatory Visit: Payer: Self-pay

## 2019-02-08 NOTE — Patient Outreach (Signed)
[place] [facility]) Care Management  02/08/2019  [name] Sep 01, 1955 [id number]   Medication Adherence call to [name] HIPPA Compliant Voice message left with a call back number. [name] is showing past due on Enalepril 10 mg under [place].  [place] Management Direct Dial [phone]  Fax [phone] [email or web address]

## 2019-03-09 ENCOUNTER — Other Ambulatory Visit: Payer: Self-pay

## 2019-03-09 NOTE — Patient Outreach (Signed)
[place] [facility]) Care Management  03/09/2019  CATALINO PLASCENCIA 1956-08-16 [id number]   Medication Adherence call to Mr. Rosalio Loud HIPPA Compliant Voice message left with a call back number. Mr. Petrovich is showing past due on Enalapril 10 mg under [place].   [place] Management Direct Dial [phone]  Fax [phone] [email or web address]

## 2019-03-11 DIAGNOSIS — R232 Flushing: Secondary | ICD-10-CM | POA: Diagnosis not present

## 2019-03-11 DIAGNOSIS — C799 Secondary malignant neoplasm of unspecified site: Secondary | ICD-10-CM | POA: Diagnosis not present

## 2019-03-29 DIAGNOSIS — G4733 Obstructive sleep apnea (adult) (pediatric): Secondary | ICD-10-CM | POA: Diagnosis not present

## 2019-04-12 DIAGNOSIS — E119 Type 2 diabetes mellitus without complications: Secondary | ICD-10-CM | POA: Diagnosis not present

## 2019-05-09 ENCOUNTER — Encounter: Payer: Self-pay | Admitting: Internal Medicine

## 2019-05-24 ENCOUNTER — Encounter: Payer: Self-pay | Admitting: *Deleted

## 2019-05-25 DIAGNOSIS — I10 Essential (primary) hypertension: Secondary | ICD-10-CM | POA: Diagnosis not present

## 2019-05-25 DIAGNOSIS — K219 Gastro-esophageal reflux disease without esophagitis: Secondary | ICD-10-CM | POA: Diagnosis not present

## 2019-05-25 DIAGNOSIS — J449 Chronic obstructive pulmonary disease, unspecified: Secondary | ICD-10-CM | POA: Diagnosis not present

## 2019-05-25 DIAGNOSIS — E7800 Pure hypercholesterolemia, unspecified: Secondary | ICD-10-CM | POA: Diagnosis not present

## 2019-06-01 DIAGNOSIS — I10 Essential (primary) hypertension: Secondary | ICD-10-CM | POA: Diagnosis not present

## 2019-06-01 DIAGNOSIS — J449 Chronic obstructive pulmonary disease, unspecified: Secondary | ICD-10-CM | POA: Diagnosis not present

## 2019-06-01 DIAGNOSIS — E118 Type 2 diabetes mellitus with unspecified complications: Secondary | ICD-10-CM | POA: Diagnosis not present

## 2019-06-01 DIAGNOSIS — G894 Chronic pain syndrome: Secondary | ICD-10-CM | POA: Diagnosis not present

## 2019-06-01 DIAGNOSIS — E7849 Other hyperlipidemia: Secondary | ICD-10-CM | POA: Diagnosis not present

## 2019-06-01 DIAGNOSIS — Z23 Encounter for immunization: Secondary | ICD-10-CM | POA: Diagnosis not present

## 2019-06-03 ENCOUNTER — Encounter: Payer: Self-pay | Admitting: Internal Medicine

## 2019-06-03 ENCOUNTER — Ambulatory Visit: Payer: Medicare Other | Admitting: Internal Medicine

## 2019-06-03 ENCOUNTER — Other Ambulatory Visit: Payer: Self-pay

## 2019-06-03 VITALS — BP 129/84 | HR 74 | Temp 96.0°F | Ht 74.0 in | Wt 256.4 lb

## 2019-06-03 DIAGNOSIS — K219 Gastro-esophageal reflux disease without esophagitis: Secondary | ICD-10-CM

## 2019-06-03 DIAGNOSIS — Z86010 Personal history of colonic polyps: Secondary | ICD-10-CM | POA: Diagnosis not present

## 2019-06-03 NOTE — Progress Notes (Signed)
Primary Care Physician:  Redmond School, MD Primary Gastroenterologist:  Dr. Gala Romney  Pre-Procedure History & Physical: HPI:  [name] is a 63 y.o. male here for follow-up GERD/dysphagia.  History of known Schatzki's ring dilated 1 year ago.  Dysphagia resolved.  Reflux symptoms well controlled on Protonix 40 mg daily.  History of multiple colonic adenomas removed in November 2019; due for surveillance colonoscopy 2022.  He tells me his prostate cancer is well controlled on new oral therapy.  He is not having any lower GI tract symptoms.  Past Medical History:  Diagnosis Date  . Anemia   . Anxiety   . Arthritis   . Bladder stone   . Cancer [facility]) 2007   prostate CA  . COPD (chronic obstructive pulmonary disease) ([place])   . Decreased pulse 08/16/2008   LE doppler - bilateral ABIs normal, bilateral PVRs normal waveform; bilateral common iliac arteries difficult to visualize however velocities suggest less than 50% diameter reduction; bilateral LE normal velocities w/o evidence of diameter reduction  . Depression   . GERD (gastroesophageal reflux disease)   . History of kidney stones   . Hypercholesteremia   . Hypertension   . Mental disorder    PTSD  . Shortness of breath   . Sleep apnea    wears CPAP nightly, settings at 14   . Tobacco abuse   . Urinary incontinence    due to prostate removal    Past Surgical History:  Procedure Laterality Date  . ANTERIOR LAT LUMBAR FUSION N/A 11/17/2012   Procedure: XLIF L3 - L4 POSTERIOR L3 -L4 FUSION INSTRUMENTATION/L2 - L3 DECOMPRESSION (REVISION OF POSTERIOR HARDWARE) 1 LEVEL;  Surgeon: Melina Schools, MD;  Location: [place];  Service: Orthopedics;  Laterality: N/A;  procedure #1: start 0923, end 1057.   [name] BACK SURGERY  2010 / 2014  . BLADDER SUSPENSION     x 2  . CARDIAC CATHETERIZATION     no PCI  . CARDIOVASCULAR STRESS TEST  08/16/2008   EF 57%; inferior region has diaphragmatic attenuation, otherwise normal perfusion w/o  evidence of ischemia or infarct; LV normal in size; no scintographic evidence of inducible ischemia; no ECG changes, negative for ischemia; low risk scan  . CERVICAL FUSION    . COLON RESECTION N/A 07/12/2013   peritoneal biopsy only, evidence of metastatic prostate cancer  . COLONOSCOPY    . COLONOSCOPY N/A 05/27/2013   RMR: Extrinsic mass effect at the level of the appendiceal orifice likely represents an appendiceal or periappendiceal process. Neovascular changes of the rectal mucosa consistent with prior history of radiation treatment. Multiple 3-5 mm polyps removed, several tubular adenomas. Diverticulosis.nex tcs 2019  . COLONOSCOPY WITH PROPOFOL N/A 07/05/2018   Procedure: COLONOSCOPY WITH PROPOFOL;  Surgeon: Daneil Dolin, MD;  [facility];  Service: Endoscopy;  Laterality: N/A;  9:30am  . CYSTOSCOPY W/ URETERAL STENT PLACEMENT N/A 10/03/2013   Procedure: CYSTOSCOPY AND REMOVAL OF BLADDER NECK STONE ;  Surgeon: Dutch Gray, MD;  [facility];  Service: Urology;  Laterality: N/A;  WITH REMOVAL OF BLADDER NECK STONE    . ESOPHAGOGASTRODUODENOSCOPY (EGD) WITH ESOPHAGEAL DILATION N/A 05/27/2013   RMR: Mild erosive reflux esophagitis with soft noncritical appearing stricture status post Maloney dilation, antral erosions with reactive gastropathy but no H. pylori  . ESOPHAGOGASTRODUODENOSCOPY (EGD) WITH PROPOFOL N/A 07/05/2018   Procedure: ESOPHAGOGASTRODUODENOSCOPY (EGD) WITH PROPOFOL;  Surgeon: Daneil Dolin, MD;  [facility];  Service: Endoscopy;  Laterality:  N/A;  . HOLMIUM LASER APPLICATION N/A [id number]   Procedure: HOLMIUM LASER APPLICATION;  Surgeon: Dutch Gray, MD;  [facility];  Service: Urology;  Laterality: N/A;  . Venia Minks DILATION N/A 07/05/2018   Procedure: Venia Minks DILATION;  Surgeon: Daneil Dolin, MD;  [facility];  Service: Endoscopy;  Laterality: N/A;  . POLYPECTOMY  07/05/2018   Procedure: POLYPECTOMY;  Surgeon: Daneil Dolin,  MD;  [facility];  Service: Endoscopy;;  cecal polyp , splenic flexure polyps x4  . PROSTATE SURGERY    . TRANSTHORACIC ECHOCARDIOGRAM  08/16/2008   essentially normal for age  . URINARY SPHINCTER IMPLANT N/A 01/31/2014   Procedure: IMPLANTATION ARTIFICIAL SPHINCTER  WITH CYSTOSCOPY;  Surgeon: Reece Packer, MD;  [facility];  Service: Urology;  Laterality: N/A;  . WRIST SURGERY Right     Prior to Admission medications   Medication Sig Start Date End Date Taking? Authorizing Provider  ARIPiprazole (ABILIFY) 10 MG tablet Take 10 mg by mouth daily. 04/13/18  Yes [provider]  Choline Fenofibrate (TRILIPIX) 135 MG capsule Take 135 mg by mouth daily.   Yes [provider]  Coenzyme Q10 (CO Q 10) 100 MG CAPS Take 100 mg by mouth daily.    Yes [provider]  desipramine (NOPRAMIN) 100 MG tablet Take 100 mg by mouth at bedtime.  09/04/14  Yes [provider]  enalapril (VASOTEC) 10 MG tablet Take 10 mg by mouth every morning.    Yes [provider]  enzalutamide (XTANDI) 40 MG capsule Take 160 mg by mouth daily. Takes 4 capsules daily   Yes [provider]  ferrous sulfate 325 (65 FE) MG tablet Take 325 mg by mouth daily.  06/30/14  Yes [provider]  Javier Docker Oil 300 MG CAPS Take 300 mg by mouth daily.   Yes [provider]  leuprolide (LUPRON) 30 MG injection Inject 30 mg into the muscle every 3 (three) months.   Yes [provider]  megestrol (MEGACE) 20 MG tablet Take 20 mg by mouth daily.  10/04/14  Yes [provider]  meloxicam (MOBIC) 7.5 MG tablet  04/18/19  Yes [provider]  Multiple Vitamin (MULTIVITAMIN WITH MINERALS) TABS tablet Take 1 tablet by mouth daily.   Yes [provider]  pantoprazole (PROTONIX) 40 MG tablet Take 1 tablet (40 mg total) by mouth daily. 30 minutes before breakfast 05/21/18  Yes Annitta Needs, NP  [place] Name: cholesterol  medication- pt does not know the name of the med.   Yes [provider]  venlafaxine XR (EFFEXOR-XR) 75 MG 24 hr capsule Take 75 mg by mouth daily.  10/03/14  Yes [provider]    Allergies as of 06/03/2019 - Review Complete 06/03/2019  Allergen Reaction Noted  . Penicillins Anaphylaxis 09/30/2011    Family History  Problem Relation Age of Onset  . Diabetes Mother   . Hypertension Mother   . Heart disease Mother   . Diabetes Father   . Hypertension Father   . Colon polyps Father        age 37  . Hypertension Brother   . Diabetes Brother   . Colon cancer Neg Hx     Social History   Socioeconomic History  . Marital status: Married    Spouse name: Not on file  . Number of children: Not on file  . Years of education: Not on file  . Highest education level:  Not on file  Occupational History  . 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 Financial resource strain: Not on file  . Food insecurity    Worry: Not on file    Inability: Not on file  . Transportation needs    Medical: Not on file    Non-medical: Not on file  Tobacco Use  . Smoking status: Former Smoker    Packs/day: 0.25    Years: 43.00    Pack years: 10.75    Types: Cigarettes    Quit date: 02/28/2013    Years since quitting: 6.2  . Smokeless tobacco: Former Systems developer    Quit date: 06/30/1980  Substance and Sexual Activity  . Alcohol use: No  . Drug use: No  . Sexual activity: Yes    Birth control/protection: None  Lifestyle  . Physical activity    Days per week: Not on file    Minutes per session: Not on file  . Stress: Not on file  Relationships  . Social Herbalist on phone: Not on file    Gets together: Not on file    Attends religious service: Not on file    Active member of club or organization: Not on file    Attends meetings of clubs or organizations: Not on file    Relationship status: Not on file  . Intimate partner violence    Fear of current  or ex partner: Not on file    Emotionally abused: Not on file    Physically abused: Not on file    Forced sexual activity: Not on file  Other Topics Concern  . Not on file  Social History Narrative  . Not on file    Review of Systems: See HPI, otherwise negative ROS  Physical Exam: BP 129/84   Pulse 74   Temp (!) 96 F (35.6 C) (Temporal)   Ht 6\' 2"  (1.88 m)   Wt 256 lb 6.4 oz (116.3 kg)   BMI 32.92 kg/m  General:   Alert,  Well-developed, well-nourished, pleasant and cooperative in NAD Neck:  Supple; no masses or thyromegaly. No significant cervical adenopathy. Lungs:  Clear throughout to auscultation.   No wheezes, crackles, or rhonchi. No acute distress. Heart:  Regular rate and rhythm; no murmurs, clicks, rubs,  or gallops. Abdomen: Non-distended, normal bowel sounds.  Soft and nontender without appreciable mass or hepatosplenomegaly.  Pulses:  Normal pulses noted. Extremities:  Without clubbing or edema.  Impression/Plan: Pleasant 63 year old gentleman longstanding GERD well-controlled on Protonix 40 mg daily.  Dysphagia  - resolved status post dilation of a Schatzki's ring.  History of multiple colonic adenomas removed 1 year ago; he will be due for surveillance colonoscopy 2022.  Recommendations:  GERD information  Continue protonix (pantoprazole 40 mg daily)  Repeat colonoscopy in 2022  Ov here in 18 months    Notice: This dictation was prepared with Dragon dictation along with smaller phrase technology. Any transcriptional errors that result from this process are unintentional and may not be corrected upon review.

## 2019-06-03 NOTE — Patient Instructions (Signed)
GERD information  Continue protonix (pantoprazole 40 mg daily)  Repeat colonoscopy in 2022  Ov here in 18 months

## 2019-06-08 ENCOUNTER — Other Ambulatory Visit: Payer: Self-pay

## 2019-06-08 DIAGNOSIS — E7849 Other hyperlipidemia: Secondary | ICD-10-CM | POA: Diagnosis not present

## 2019-06-08 DIAGNOSIS — E119 Type 2 diabetes mellitus without complications: Secondary | ICD-10-CM | POA: Diagnosis not present

## 2019-06-08 DIAGNOSIS — E782 Mixed hyperlipidemia: Secondary | ICD-10-CM

## 2019-06-08 DIAGNOSIS — I10 Essential (primary) hypertension: Secondary | ICD-10-CM

## 2019-06-08 LAB — COMPREHENSIVE METABOLIC PANEL
ALT: 24 IU/L (ref 0–44)
AST: 15 IU/L (ref 0–40)
Albumin/Globulin Ratio: 1.7 (ref 1.2–2.2)
Albumin: 4.6 g/dL (ref 3.8–4.8)
Alkaline Phosphatase: 54 IU/L (ref 39–117)
BUN/Creatinine Ratio: 23 (ref 10–24)
BUN: 22 mg/dL (ref 8–27)
Bilirubin Total: <0.2 mg/dL (ref 0.0–1.2)
CO2: 23 mmol/L (ref 20–29)
Calcium: 9.9 mg/dL (ref 8.6–10.2)
Chloride: 103 mmol/L (ref 96–106)
Creatinine, Ser: 0.95 mg/dL (ref 0.76–1.27)
GFR calc Af Amer: 97 mL/min/{1.73_m2} (ref >59)
GFR calc non Af Amer: 84 mL/min/{1.73_m2} (ref >59)
Globulin, Total: 2.7 g/dL (ref 1.5–4.5)
Glucose: 128 mg/dL — ABNORMAL HIGH (ref 65–99)
Potassium: 4.3 mmol/L (ref 3.5–5.2)
Sodium: 141 mmol/L (ref 134–144)
Total Protein: 7.3 g/dL (ref 6.0–8.5)

## 2019-06-08 LAB — LIPID PANEL
Chol/HDL Ratio: 4.8 ratio (ref 0.0–5.0)
Cholesterol, Total: 168 mg/dL (ref 100–199)
HDL: 35 mg/dL — ABNORMAL LOW (ref >39)
LDL Chol Calc (NIH): 97 mg/dL (ref 0–99)
Triglycerides: 207 mg/dL — ABNORMAL HIGH (ref 0–149)
VLDL Cholesterol Cal: 36 mg/dL (ref 5–40)

## 2019-06-15 ENCOUNTER — Other Ambulatory Visit: Payer: Self-pay | Admitting: Urology

## 2019-06-15 DIAGNOSIS — M8580 Other specified disorders of bone density and structure, unspecified site: Secondary | ICD-10-CM

## 2019-06-22 DIAGNOSIS — I10 Essential (primary) hypertension: Secondary | ICD-10-CM | POA: Diagnosis not present

## 2019-06-22 DIAGNOSIS — K219 Gastro-esophageal reflux disease without esophagitis: Secondary | ICD-10-CM | POA: Diagnosis not present

## 2019-06-22 DIAGNOSIS — E7800 Pure hypercholesterolemia, unspecified: Secondary | ICD-10-CM | POA: Diagnosis not present

## 2019-06-22 DIAGNOSIS — J449 Chronic obstructive pulmonary disease, unspecified: Secondary | ICD-10-CM | POA: Diagnosis not present

## 2019-06-25 DIAGNOSIS — E782 Mixed hyperlipidemia: Secondary | ICD-10-CM | POA: Diagnosis not present

## 2019-06-25 DIAGNOSIS — E119 Type 2 diabetes mellitus without complications: Secondary | ICD-10-CM | POA: Diagnosis not present

## 2019-06-25 DIAGNOSIS — I10 Essential (primary) hypertension: Secondary | ICD-10-CM | POA: Diagnosis not present

## 2019-06-27 DIAGNOSIS — G4733 Obstructive sleep apnea (adult) (pediatric): Secondary | ICD-10-CM | POA: Diagnosis not present

## 2019-07-25 DIAGNOSIS — M1991 Primary osteoarthritis, unspecified site: Secondary | ICD-10-CM | POA: Diagnosis not present

## 2019-07-25 DIAGNOSIS — I10 Essential (primary) hypertension: Secondary | ICD-10-CM | POA: Diagnosis not present

## 2019-07-25 DIAGNOSIS — E119 Type 2 diabetes mellitus without complications: Secondary | ICD-10-CM | POA: Diagnosis not present

## 2019-08-25 DIAGNOSIS — I10 Essential (primary) hypertension: Secondary | ICD-10-CM | POA: Diagnosis not present

## 2019-08-25 DIAGNOSIS — M1991 Primary osteoarthritis, unspecified site: Secondary | ICD-10-CM | POA: Diagnosis not present

## 2019-08-25 DIAGNOSIS — E119 Type 2 diabetes mellitus without complications: Secondary | ICD-10-CM | POA: Diagnosis not present

## 2019-09-09 ENCOUNTER — Other Ambulatory Visit: Payer: Self-pay | Admitting: Urology

## 2019-09-09 DIAGNOSIS — M8580 Other specified disorders of bone density and structure, unspecified site: Secondary | ICD-10-CM

## 2019-09-09 DIAGNOSIS — C61 Malignant neoplasm of prostate: Secondary | ICD-10-CM

## 2019-09-13 ENCOUNTER — Ambulatory Visit
Admission: RE | Admit: 2019-09-13 | Discharge: 2019-09-13 | Disposition: A | Discharge status: Home / Self Care | Payer: Medicare Other | Source: Ambulatory Visit | Attending: Urology | Admitting: Urology

## 2019-09-13 ENCOUNTER — Other Ambulatory Visit: Payer: Self-pay

## 2019-09-13 DIAGNOSIS — M8580 Other specified disorders of bone density and structure, unspecified site: Secondary | ICD-10-CM | POA: Diagnosis not present

## 2019-09-13 DIAGNOSIS — C61 Malignant neoplasm of prostate: Secondary | ICD-10-CM

## 2019-09-13 DIAGNOSIS — Z13820 Encounter for screening for osteoporosis: Secondary | ICD-10-CM | POA: Diagnosis not present

## 2019-09-21 DIAGNOSIS — C799 Secondary malignant neoplasm of unspecified site: Secondary | ICD-10-CM | POA: Diagnosis not present

## 2019-09-21 DIAGNOSIS — Z5111 Encounter for antineoplastic chemotherapy: Secondary | ICD-10-CM | POA: Diagnosis not present

## 2019-09-25 DIAGNOSIS — I10 Essential (primary) hypertension: Secondary | ICD-10-CM | POA: Diagnosis not present

## 2019-09-25 DIAGNOSIS — M1991 Primary osteoarthritis, unspecified site: Secondary | ICD-10-CM | POA: Diagnosis not present

## 2019-09-25 DIAGNOSIS — E119 Type 2 diabetes mellitus without complications: Secondary | ICD-10-CM | POA: Diagnosis not present

## 2019-09-26 DIAGNOSIS — G4733 Obstructive sleep apnea (adult) (pediatric): Secondary | ICD-10-CM | POA: Diagnosis not present

## 2019-10-23 DIAGNOSIS — M1991 Primary osteoarthritis, unspecified site: Secondary | ICD-10-CM | POA: Diagnosis not present

## 2019-10-23 DIAGNOSIS — E119 Type 2 diabetes mellitus without complications: Secondary | ICD-10-CM | POA: Diagnosis not present

## 2019-10-23 DIAGNOSIS — I10 Essential (primary) hypertension: Secondary | ICD-10-CM | POA: Diagnosis not present

## 2019-10-26 DIAGNOSIS — G4733 Obstructive sleep apnea (adult) (pediatric): Secondary | ICD-10-CM | POA: Diagnosis not present

## 2019-11-23 DIAGNOSIS — E119 Type 2 diabetes mellitus without complications: Secondary | ICD-10-CM | POA: Diagnosis not present

## 2019-11-23 DIAGNOSIS — M1991 Primary osteoarthritis, unspecified site: Secondary | ICD-10-CM | POA: Diagnosis not present

## 2019-11-23 DIAGNOSIS — Z192 Hormone resistant malignancy status: Secondary | ICD-10-CM | POA: Diagnosis not present

## 2019-12-21 DIAGNOSIS — C799 Secondary malignant neoplasm of unspecified site: Secondary | ICD-10-CM | POA: Diagnosis not present

## 2019-12-23 DIAGNOSIS — E119 Type 2 diabetes mellitus without complications: Secondary | ICD-10-CM | POA: Diagnosis not present

## 2019-12-23 DIAGNOSIS — Z192 Hormone resistant malignancy status: Secondary | ICD-10-CM | POA: Diagnosis not present

## 2019-12-23 DIAGNOSIS — M1991 Primary osteoarthritis, unspecified site: Secondary | ICD-10-CM | POA: Diagnosis not present

## 2020-01-11 DIAGNOSIS — E7849 Other hyperlipidemia: Secondary | ICD-10-CM | POA: Diagnosis not present

## 2020-01-11 DIAGNOSIS — Z7689 Persons encountering health services in other specified circumstances: Secondary | ICD-10-CM | POA: Diagnosis not present

## 2020-01-11 DIAGNOSIS — E119 Type 2 diabetes mellitus without complications: Secondary | ICD-10-CM | POA: Diagnosis not present

## 2020-01-18 ENCOUNTER — Other Ambulatory Visit: Payer: Self-pay | Admitting: Urology

## 2020-01-18 ENCOUNTER — Other Ambulatory Visit (HOSPITAL_COMMUNITY): Payer: Self-pay | Admitting: Urology

## 2020-01-18 DIAGNOSIS — C61 Malignant neoplasm of prostate: Secondary | ICD-10-CM

## 2020-01-18 DIAGNOSIS — C799 Secondary malignant neoplasm of unspecified site: Secondary | ICD-10-CM

## 2020-01-23 DIAGNOSIS — M1991 Primary osteoarthritis, unspecified site: Secondary | ICD-10-CM | POA: Diagnosis not present

## 2020-01-23 DIAGNOSIS — E119 Type 2 diabetes mellitus without complications: Secondary | ICD-10-CM | POA: Diagnosis not present

## 2020-01-23 DIAGNOSIS — Z192 Hormone resistant malignancy status: Secondary | ICD-10-CM | POA: Diagnosis not present

## 2020-01-24 DIAGNOSIS — G4733 Obstructive sleep apnea (adult) (pediatric): Secondary | ICD-10-CM | POA: Diagnosis not present

## 2020-01-25 DIAGNOSIS — Z1389 Encounter for screening for other disorder: Secondary | ICD-10-CM | POA: Diagnosis not present

## 2020-01-25 DIAGNOSIS — Z0001 Encounter for general adult medical examination with abnormal findings: Secondary | ICD-10-CM | POA: Diagnosis not present

## 2020-01-25 DIAGNOSIS — G8929 Other chronic pain: Secondary | ICD-10-CM | POA: Diagnosis not present

## 2020-01-25 DIAGNOSIS — J449 Chronic obstructive pulmonary disease, unspecified: Secondary | ICD-10-CM | POA: Diagnosis not present

## 2020-01-31 ENCOUNTER — Encounter: Payer: Self-pay | Admitting: Cardiovascular Disease

## 2020-01-31 ENCOUNTER — Ambulatory Visit: Payer: Medicare Other | Admitting: Cardiovascular Disease

## 2020-01-31 ENCOUNTER — Other Ambulatory Visit: Payer: Self-pay

## 2020-01-31 VITALS — BP 130/80 | HR 76 | Ht 74.0 in | Wt 258.4 lb

## 2020-01-31 DIAGNOSIS — E669 Obesity, unspecified: Secondary | ICD-10-CM

## 2020-01-31 DIAGNOSIS — E1169 Type 2 diabetes mellitus with other specified complication: Secondary | ICD-10-CM | POA: Diagnosis not present

## 2020-01-31 DIAGNOSIS — G4733 Obstructive sleep apnea (adult) (pediatric): Secondary | ICD-10-CM | POA: Diagnosis not present

## 2020-01-31 DIAGNOSIS — C61 Malignant neoplasm of prostate: Secondary | ICD-10-CM

## 2020-01-31 DIAGNOSIS — E782 Mixed hyperlipidemia: Secondary | ICD-10-CM | POA: Diagnosis not present

## 2020-01-31 DIAGNOSIS — I10 Essential (primary) hypertension: Secondary | ICD-10-CM | POA: Diagnosis not present

## 2020-01-31 NOTE — Patient Instructions (Signed)
Medication Instructions:  NO CHANGE *If you need a refill on your cardiac medications before your next appointment, please call your pharmacy*   Lab Work: If you have labs (blood work) drawn today and your tests are completely normal, you will receive your results only by: [name] MyChart Message (if you have MyChart) OR . A paper copy in the mail If you have any lab test that is abnormal or we need to change your treatment, we will call you to review the results.   Follow-Up: At [facility], you and your health needs are our priority.  As part of our continuing mission to provide you with exceptional heart care, we have created designated Provider Care Teams.  These Care Teams include your primary Cardiologist (physician) and Advanced Practice Providers (APPs -  Physician Assistants and Nurse Practitioners) who all work together to provide you with the care you need, when you need it.  We recommend signing up for the patient portal called "MyChart".  Sign up information is provided on this After Visit Summary.  MyChart is used to connect with patients for Virtual Visits (Telemedicine).  Patients are able to view lab/test results, encounter notes, upcoming appointments, etc.  Non-urgent messages can be sent to your provider as well.   To learn more about what you can do with MyChart, go to [email or web address].    Your next appointment:   12 month(s)  The format for your next appointment:   Either In Person or Virtual  Provider:   You may see Sanda Klein MD or one of the following Advanced Practice Providers on your designated Care Team:    Almyra Deforest, PA-C  Fabian Sharp, PA-C or   Roby Lofts, [place]

## 2020-02-02 ENCOUNTER — Telehealth (INDEPENDENT_AMBULATORY_CARE_PROVIDER_SITE_OTHER): Payer: Medicare Other | Admitting: Cardiovascular Disease

## 2020-02-02 VITALS — BP 108/61 | HR 80 | Ht 74.0 in | Wt 260.0 lb

## 2020-02-02 DIAGNOSIS — I10 Essential (primary) hypertension: Secondary | ICD-10-CM | POA: Diagnosis not present

## 2020-02-02 DIAGNOSIS — G4733 Obstructive sleep apnea (adult) (pediatric): Secondary | ICD-10-CM

## 2020-02-02 DIAGNOSIS — C61 Malignant neoplasm of prostate: Secondary | ICD-10-CM

## 2020-02-02 DIAGNOSIS — E782 Mixed hyperlipidemia: Secondary | ICD-10-CM

## 2020-02-02 DIAGNOSIS — E669 Obesity, unspecified: Secondary | ICD-10-CM

## 2020-02-02 NOTE — Progress Notes (Signed)
Virtual Visit via Telephone Note   This visit type was conducted due to national recommendations for restrictions regarding the COVID-19 Pandemic (e.g. social distancing) in an effort to limit this patient's exposure and mitigate transmission in our community.  Due to his co-morbid illnesses, this patient is at least at moderate risk for complications without adequate follow up.  This format is felt to be most appropriate for this patient at this time.  The patient did not have access to video technology/had technical difficulties with video requiring transitioning to audio format only (telephone).  All issues noted in this document were discussed and addressed.  No physical exam could be performed with this format.  Please refer to the patient's chart for his  consent to telehealth for [facility].   The patient was identified using 2 identifiers.  Date:  02/02/2020   ID:  [name], DOB 06/18/1956, MRN [id number]  Patient Location: Home Provider Location: Home  PCP:  Redmond School, MD  Cardiologist:  Dr. Hardie Shackleton, MD (sleep) Electrophysiologist:  None   Evaluation Performed:  Follow-Up Visit  Chief Complaint:  One year evaluation  History of Present Illness:    [name] is a 64 y.o. male who has a history of hypertension, hyperlipidemia, COPD, type 2 diabetes mellitus, obesity, as well as metastatic prostate CA on anti-antigen therapy. He has a history of obstructive sleep apnea that dates back to 2012. He had an old Respironics unit which is an auto unit which is set at 4 -20. He brought his machine with him to the office today. He has not been followed by a sleep physician only saw Dr. Chancy Milroy once after initiation of treatment. In 2012. Recently, he has not been sleeping well. He is not getting adequate pressures. The machine tends to cut off at times. A download reveals a 30 day sleep average of 8 hours and 30 minutes. Days greater than 4 hours were 29  out of 30. AHI was 3.0. He has noticed breakthrough snoring. He wakes up at least 3 times per night for nocturia. He is unaware of restless legs. He denies bruxism. An Epworth Sleepiness Scale score was calculated in the office today and this endorsed at 7 with a slight chance of dozing while sitting and reading, as a passenger in a car for an hour without a break, and while sitting quietly after lunch. There is a moderate chance of dozing while watching television, and lying down to rest in the afternoon.  When I saw him for initial sleep evaluation in January 2019 his machine was beginning to malfunction and he was feeling that he was not getting adequate treatment. Since it is been 7 to 8 years since his initial evaluation I recommended a repeat evaluation. Apparently he had this done at [facility]. His diagnostic study on October 06, 2017 with mild sleep apnea overall with an AHI of 8.3, but his RDI was 14.9. However, he had severe sleep apnea with rem sleep with an AHI 51.4 and significant oxygen desaturation to 81%. He also had frequent periodic limb movement disorder of sleep. On that night, he did not meet split night criteria and returned in March for CPAP titration. This was an inadequate titration. Consequently he was set up with a CPAP auto unit with a minimum pressure of 6 a maximum of 20. I saw him in May 2019 at which time he was 100% compliant since reinitiation December 14, 2017. He is averaging  8 hours and 18 minutes of sleep per night. AHI is excellent at 1.2. There was only few days with mask leak. His 95th percentile pressure was 13.5 with maximum average pressure 15.5. Typically he goes to bed arouind10 p.m. and wakes up at 8 am.He notices significant improvement with this new machine. It is much quieter. He is sleeping well. He does not have any breakthrough snoring. He denies any issues with painful restless legs despite being documented to have increased  limb movement disorder of sleep.  I last evaluated him on 01/26/2019 in a telemedicine visit at which time he had continued to do well since his prior evaluation.  I was able to a new download from Dec 27, 2018 through January 25, 2019.  He is 100% compliant.  Average sleep duration is 9 hours and 13 minutes.  CPAP is set at a pressure range of 10 to 20 cm of water.  AHI is excellent at 0.9.  There is no leak.  His 95th percentile pressure is 12.6 with a maximum average pressure at 14.4.  He has a ResMed F 20 fullface mask.  He admits to not being very active particularly during this COVID-19 pandemic.  He denies any chest pain PND orthopnea or palpitations.  He continues to be on therapy for prostate CA followed by Dr. Alinda Money.  Over the past year he continues to do well and had seen Dr. Recardo Evangelist for a yearly follow-up evaluation.  From a sleep perspective, he has felt well and has continued to use CPAP.  I obtained a new download from May 9 through January 30, 2020.  This continues to show excellent compliance.  He has used his machine every night except to when he was in [place].  Average sleep duration is 10 hours and 44 minutes.  His CPAP auto unit is set at a pressure range of 10 to 20 cm.  95th percentile pressure is 13.9 with a maximum average pressure of 15.6 cm of water.  AHI is excellent at 1.4/h.  There is no leak.  He is unaware of any breakthrough snoring.  He denies residual daytime sleepiness.  [employer] is his DME company.  He continues to follow with Dr. Alinda Money for his prostate CA.  His blood pressure has been stable on enalapril 10 mg daily.  He continues to take Effexor and Abilify for depression.  He is on pantoprazole for GERD.  He has not had significant success with weight loss.  He continues to be on rosuvastatin for hyperlipidemia he presents for evaluation.  The patient does not have symptoms concerning for COVID-19 infection (fever, chills, cough, or new shortness of breath).    Past  Medical History:  Diagnosis Date  . Anemia   . Anxiety   . Arthritis   . Bladder stone   . Cancer [facility]) 2007   prostate CA  . COPD (chronic obstructive pulmonary disease) ([place])   . Decreased pulse 08/16/2008   LE doppler - bilateral ABIs normal, bilateral PVRs normal waveform; bilateral common iliac arteries difficult to visualize however velocities suggest less than 50% diameter reduction; bilateral LE normal velocities w/o evidence of diameter reduction  . Depression   . GERD (gastroesophageal reflux disease)   . History of kidney stones   . Hypercholesteremia   . Hypertension   . Mental disorder    PTSD  . Shortness of breath   . Sleep apnea    wears CPAP nightly, settings at 14   . Tobacco abuse   .  Urinary incontinence    due to prostate removal   Past Surgical History:  Procedure Laterality Date  . ANTERIOR LAT LUMBAR FUSION N/A 11/17/2012   Procedure: XLIF L3 - L4 POSTERIOR L3 -L4 FUSION INSTRUMENTATION/L2 - L3 DECOMPRESSION (REVISION OF POSTERIOR HARDWARE) 1 LEVEL;  Surgeon: Melina Schools, MD;  Location: [facility];  Service: Orthopedics;  Laterality: N/A;  procedure #1: start 0923, end 1057.   [name] BACK SURGERY  2010 / 2014  . BLADDER SUSPENSION     x 2  . CARDIAC CATHETERIZATION     no PCI  . CARDIOVASCULAR STRESS TEST  08/16/2008   EF 57%; inferior region has diaphragmatic attenuation, otherwise normal perfusion w/o evidence of ischemia or infarct; LV normal in size; no scintographic evidence of inducible ischemia; no ECG changes, negative for ischemia; low risk scan  . CERVICAL FUSION    . COLON RESECTION N/A 07/12/2013   peritoneal biopsy only, evidence of metastatic prostate cancer  . COLONOSCOPY    . COLONOSCOPY N/A 05/27/2013   RMR: Extrinsic mass effect at the level of the appendiceal orifice likely represents an appendiceal or periappendiceal process. Neovascular changes of the rectal mucosa consistent with prior history of radiation treatment. Multiple 3-5 mm polyps  removed, several tubular adenomas. Diverticulosis.nex tcs 2019  . COLONOSCOPY WITH PROPOFOL N/A 07/05/2018   Procedure: COLONOSCOPY WITH PROPOFOL;  Surgeon: Daneil Dolin, MD;  [facility];  Service: Endoscopy;  Laterality: N/A;  9:30am  . CYSTOSCOPY W/ URETERAL STENT PLACEMENT N/A 10/03/2013   Procedure: CYSTOSCOPY AND REMOVAL OF BLADDER NECK STONE ;  Surgeon: Dutch Gray, MD;  Location: [facility];  Service: Urology;  Laterality: N/A;  WITH REMOVAL OF BLADDER NECK STONE    . ESOPHAGOGASTRODUODENOSCOPY (EGD) WITH ESOPHAGEAL DILATION N/A 05/27/2013   RMR: Mild erosive reflux esophagitis with soft noncritical appearing stricture status post Maloney dilation, antral erosions with reactive gastropathy but no H. pylori  . ESOPHAGOGASTRODUODENOSCOPY (EGD) WITH PROPOFOL N/A 07/05/2018   Procedure: ESOPHAGOGASTRODUODENOSCOPY (EGD) WITH PROPOFOL;  Surgeon: Daneil Dolin, MD;  [facility];  Service: Endoscopy;  Laterality: N/A;  . HOLMIUM LASER APPLICATION N/A 09/27/3005   Procedure: HOLMIUM LASER APPLICATION;  Surgeon: Dutch Gray, MD;  [facility];  Service: Urology;  Laterality: N/A;  . Venia Minks DILATION N/A 07/05/2018   Procedure: Venia Minks DILATION;  Surgeon: Daneil Dolin, MD;  [facility];  Service: Endoscopy;  Laterality: N/A;  . POLYPECTOMY  07/05/2018   Procedure: POLYPECTOMY;  Surgeon: Daneil Dolin, MD;  [facility];  Service: Endoscopy;;  cecal polyp , splenic flexure polyps x4  . PROSTATE SURGERY    . TRANSTHORACIC ECHOCARDIOGRAM  08/16/2008   essentially normal for age  . URINARY SPHINCTER IMPLANT N/A 01/31/2014   Procedure: IMPLANTATION ARTIFICIAL SPHINCTER  WITH CYSTOSCOPY;  Surgeon: Reece Packer, MD;  [facility];  Service: Urology;  Laterality: N/A;  . WRIST SURGERY Right      Current Meds  Medication Sig  . ARIPiprazole (ABILIFY) 10 MG tablet Take 10 mg by mouth daily.  . Choline Fenofibrate (TRILIPIX) 135 MG capsule Take  135 mg by mouth daily.  . Coenzyme Q10 (CO Q 10) 100 MG CAPS Take 100 mg by mouth daily.   [name] desipramine (NOPRAMIN) 100 MG tablet Take 100 mg by mouth at bedtime.   . enalapril (VASOTEC) 10 MG tablet Take 10 mg by mouth every morning.   . enzalutamide (XTANDI) 40 MG capsule Take 160 mg by mouth  daily. Takes 4 capsules daily  . ferrous sulfate 325 (65 FE) MG tablet Take 325 mg by mouth daily.   Javier Docker Oil 300 MG CAPS Take 300 mg by mouth daily.  . meloxicam (MOBIC) 7.5 MG tablet   . Multiple Vitamin (MULTIVITAMIN WITH MINERALS) TABS tablet Take 1 tablet by mouth daily.  . pantoprazole (PROTONIX) 40 MG tablet Take 1 tablet (40 mg total) by mouth daily. 30 minutes before breakfast  . rosuvastatin (CRESTOR) 20 MG tablet Take 20 mg by mouth daily.  [name] venlafaxine XR (EFFEXOR-XR) 75 MG 24 hr capsule Take 75 mg by mouth daily.      Allergies:   Penicillins   Social History   Tobacco Use  . Smoking status: Former Smoker    Packs/day: 0.25    Years: 43.00    Pack years: 10.75    Types: Cigarettes    Quit date: 02/28/2013    Years since quitting: 6.9  . Smokeless tobacco: Former Systems developer    Quit date: 06/30/1980  Vaping Use  . Vaping Use: Never used  Substance Use Topics  . Alcohol use: No  . Drug use: No     Family Hx: The patient's family history includes Colon polyps in his father; Diabetes in his brother, father, and mother; Heart disease in his mother; Hypertension in his brother, father, and mother. There is no history of Colon cancer.  ROS:   Please see the history of present illness.    No fevers chills night sweats No weight loss Breathing well without awareness of wheezing No chest pain No palpitations  No abdominal pain No edema Continues to use a ResMed air fit F 20 fullface mask without leak.  No breakthrough snoring continues to be compliant with CPAP therapy.  No bruxism, sleep paralysis or significant residual daytime sleepiness  All other systems reviewed and are  negative.   Prior CV studies:   The following studies were reviewed today:  I obtained a new download of his CPAP unit from May 9 through January 30, 2020.  He continues to be compliant.  CPAP auto set at 10 to 20 cm of water pressure.  AHI is excellent at 1.4 with his 95 percentile pressure at 13.9 cm.  No leak with his mask.  Average sleep duration 7 hours and 44 minutes.  Labs/Other Tests and Data Reviewed:    EKG: I personally reviewed his most recent ECG from January 31, 2020 which showed normal sinus rhythm at 76 without ectopy.  There were no ST segment changes.  QTc interval 450 ms.  Recent Labs: 06/08/2019: ALT 24; BUN 22; Creatinine, Ser 0.95; Potassium 4.3; Sodium 141   Recent Lipid Panel Lab Results  Component Value Date/Time   CHOL 168 06/08/2019 08:19 AM   TRIG 207 (H) 06/08/2019 08:19 AM   HDL 35 (L) 06/08/2019 08:19 AM   CHOLHDL 4.8 06/08/2019 08:19 AM   LDLCALC 97 06/08/2019 08:19 AM    Wt Readings from Last 3 Encounters:  02/02/20 260 lb (117.9 kg)  01/31/20 258 lb 6.4 oz (117.2 kg)  06/03/19 256 lb 6.4 oz (116.3 kg)     Objective:    Vital Signs:  BP 108/61   Pulse 80   Ht 6\' 2"  (1.88 m)   Wt 260 lb (117.9 kg)   BMI 33.38 kg/m    Since this was a virtual visit I could not physically examine the patient. Vital signs were reviewed, blood pressure stable at 108/61 and pulse regular. Breathing  was normal and not labored He did not any audible wheezing Palpation of his pulse was a regular rhythm He denied any chest wall tenderness or abdominal tenderness. There was no swelling Admitted to good sleep.  No breakthrough snoring.  No mask leak  ASSESSMENT & PLAN:    1. OSA on CPAP: He has been on therapy since 2012.  Prior sleep evaluation has revealed mild overall sleep apnea which was very severe during REM sleep with an AHI of 51.4/h associated with significant oxygen desaturation to a nadir of 81%.  He admits to excellent compliance .  There only 2 days over  the past month when he did not use therapy when he was out of town in [place] and did not have the power.  He continues to do well with his ResMed air fit F 20 fullface mask without any leak.  95th percentile pressure is 13.9 cm with a maximum average pressure 15.6 cm.  CPAP parameters are excellent.  I will not make adjustments presently.  I discussed optimal sleep duration at 8 hours if at all possible. 2. Essential hypertension: He continues to be on enalapril 10 mg daily.  Blood pressure today is excellent. 3. Obesity: I discussed the report of weight loss with increased exercise both for his cardiovascular health as well as sleep apnea benefit.  I discussed ideally exercising 5 days/week for at least 30 minutes if at all possible. 4. Mixed hyperlipidemia: He continues to be on rosuvastatin 20 mg and generic Trilipix. 5. Depression: Now on Abilify and Effexor.  His most recent ECG showed QTc interval at 450 ms. 6. Prostate CA: Followed by Dr. Alinda Money on [facility]  COVID-19 Education: The signs and symptoms of COVID-19 were discussed with the patient and how to seek care for testing (follow up with PCP or arrange E-visit).  The importance of social distancing was discussed today.  Time:   Today, I have spent 17 minutes with the patient with telehealth technology discussing the above problems.     Medication Adjustments/Labs and Tests Ordered: Current medicines are reviewed at length with the patient today.  Concerns regarding medicines are outlined above.   Tests Ordered: No orders of the defined types were placed in this encounter.   Medication Changes: No orders of the defined types were placed in this encounter.   Follow Up:  In Person 1 year  Signed, Shelva Majestic, MD  02/02/2020 9:37 AM    [place]

## 2020-02-02 NOTE — Patient Instructions (Signed)
Medication Instructions:  NO CHANGES *If you need a refill on your cardiac medications before your next appointment, please call your pharmacy*  Lab Work: NONE ORDERED THIS VISIT  Testing/Procedures: NONE ORDERED THIS VISIT  Follow-Up: At [facility], you and your health needs are our priority.  As part of our continuing mission to provide you with exceptional heart care, we have created designated Provider Care Teams.  These Care Teams include your primary Cardiologist (physician) and Advanced Practice Providers (APPs -  Physician Assistants and Nurse Practitioners) who all work together to provide you with the care you need, when you need it.  Your next appointment:   12 month(s)   You will receive a reminder letter in the mail two months in advance. If you don't receive a letter, please call our office to schedule the follow-up appointment.  The format for your next appointment:   In Person  Provider:   Shelva Majestic, MD

## 2020-02-04 ENCOUNTER — Encounter: Payer: Self-pay | Admitting: Cardiovascular Disease

## 2020-02-05 NOTE — Progress Notes (Signed)
Cardiology office note   Date:  02/05/2020   ID:  [name], DOB 11/30/1955, MRN [id number] PCP:  Redmond School, MD  Cardiologist:  Terriah Reggio Electrophysiologist:  None   Evaluation Performed:  Follow-Up Visit  Chief Complaint:  Fatigue  History of Present Illness:    [name] is a 64 y.o. male with multiple obesity related complications including type 2 diabetes mellitus, hypercholesterolemia, essential hypertension, obstructive sleep apnea, also with COPD related to previous smoking (quit 2014) and widespread metastatic prostate cancer that is well controlled on antiandrogen therapy. Angiography in 2011 showed normal coronaries.  Other than his usual complaints of fatigue, he has no cardiovascular issues.The patient specifically denies any chest pain at rest exertion, dyspnea at rest or with exertion, orthopnea, paroxysmal nocturnal dyspnea, syncope, palpitations, focal neurological deficits, intermittent claudication, lower extremity edema, unexplained weight gain, cough, hemoptysis or wheezing.  A visiting nurse recently told him that his blood pressure was higher in his right upper extremity compared to the left and said he should be evaluated.  However on my evaluation today the blood pressure is actually slightly higher on the left side and not significantly so.  Left arm 111/64, right arm 101/61 mmHg.  He reports compliance with CPAP and denies daytime hypersomnolence.  He had labs performed on May 17, but I do not have access to those.  The most recent labs I can see are from October 2020 and show an LDL cholesterol of 97, HDL 35 and triglycerides 207.  Last hemoglobin A1c is from August 2020 at 6.2%.  Creatinine was normal 0.95.  The patient does not have symptoms concerning for COVID-19 infection (fever, chills, cough, or new shortness of breath).    Past Medical History:  Diagnosis Date  . Anemia   . Anxiety   . Arthritis   . Bladder stone   . Cancer [facility])  2007   prostate CA  . COPD (chronic obstructive pulmonary disease) ([place])   . Decreased pulse 08/16/2008   LE doppler - bilateral ABIs normal, bilateral PVRs normal waveform; bilateral common iliac arteries difficult to visualize however velocities suggest less than 50% diameter reduction; bilateral LE normal velocities w/o evidence of diameter reduction  . Depression   . GERD (gastroesophageal reflux disease)   . History of kidney stones   . Hypercholesteremia   . Hypertension   . Mental disorder    PTSD  . Shortness of breath   . Sleep apnea    wears CPAP nightly, settings at 14   . Tobacco abuse   . Urinary incontinence    due to prostate removal   Past Surgical History:  Procedure Laterality Date  . ANTERIOR LAT LUMBAR FUSION N/A 11/17/2012   Procedure: XLIF L3 - L4 POSTERIOR L3 -L4 FUSION INSTRUMENTATION/L2 - L3 DECOMPRESSION (REVISION OF POSTERIOR HARDWARE) 1 LEVEL;  Surgeon: Melina Schools, MD;  Location: [place];  Service: Orthopedics;  Laterality: N/A;  procedure #1: start 0923, end 1057.   [name] BACK SURGERY  2010 / 2014  . BLADDER SUSPENSION     x 2  . CARDIAC CATHETERIZATION     no PCI  . CARDIOVASCULAR STRESS TEST  08/16/2008   EF 57%; inferior region has diaphragmatic attenuation, otherwise normal perfusion w/o evidence of ischemia or infarct; LV normal in size; no scintographic evidence of inducible ischemia; no ECG changes, negative for ischemia; low risk scan  . CERVICAL FUSION    . COLON RESECTION N/A 07/12/2013   peritoneal biopsy only, evidence  of metastatic prostate cancer  . COLONOSCOPY    . COLONOSCOPY N/A 05/27/2013   RMR: Extrinsic mass effect at the level of the appendiceal orifice likely represents an appendiceal or periappendiceal process. Neovascular changes of the rectal mucosa consistent with prior history of radiation treatment. Multiple 3-5 mm polyps removed, several tubular adenomas. Diverticulosis.nex tcs 2019  . COLONOSCOPY WITH PROPOFOL N/A 07/05/2018     Procedure: COLONOSCOPY WITH PROPOFOL;  Surgeon: Daneil Dolin, MD;  [facility];  Service: Endoscopy;  Laterality: N/A;  9:30am  . CYSTOSCOPY W/ URETERAL STENT PLACEMENT N/A 10/03/2013   Procedure: CYSTOSCOPY AND REMOVAL OF BLADDER NECK STONE ;  Surgeon: Dutch Gray, MD;  [facility];  Service: Urology;  Laterality: N/A;  WITH REMOVAL OF BLADDER NECK STONE    . ESOPHAGOGASTRODUODENOSCOPY (EGD) WITH ESOPHAGEAL DILATION N/A 05/27/2013   RMR: Mild erosive reflux esophagitis with soft noncritical appearing stricture status post Maloney dilation, antral erosions with reactive gastropathy but no H. pylori  . ESOPHAGOGASTRODUODENOSCOPY (EGD) WITH PROPOFOL N/A 07/05/2018   Procedure: ESOPHAGOGASTRODUODENOSCOPY (EGD) WITH PROPOFOL;  Surgeon: Daneil Dolin, MD;  [facility];  Service: Endoscopy;  Laterality: N/A;  . HOLMIUM LASER APPLICATION N/A 08/30/9676   Procedure: HOLMIUM LASER APPLICATION;  Surgeon: Dutch Gray, MD;  [facility];  Service: Urology;  Laterality: N/A;  . Venia Minks DILATION N/A 07/05/2018   Procedure: Venia Minks DILATION;  Surgeon: Daneil Dolin, MD;  [facility];  Service: Endoscopy;  Laterality: N/A;  . POLYPECTOMY  07/05/2018   Procedure: POLYPECTOMY;  Surgeon: Daneil Dolin, MD;  [facility];  Service: Endoscopy;;  cecal polyp , splenic flexure polyps x4  . PROSTATE SURGERY    . TRANSTHORACIC ECHOCARDIOGRAM  08/16/2008   essentially normal for age  . URINARY SPHINCTER IMPLANT N/A 01/31/2014   Procedure: IMPLANTATION ARTIFICIAL SPHINCTER  WITH CYSTOSCOPY;  Surgeon: Reece Packer, MD;  [facility];  Service: Urology;  Laterality: N/A;  . WRIST SURGERY Right      Current Meds  Medication Sig  . ARIPiprazole (ABILIFY) 10 MG tablet Take 10 mg by mouth daily.  . Choline Fenofibrate (TRILIPIX) 135 MG capsule Take 135 mg by mouth daily.  . Coenzyme Q10 (CO Q 10) 100 MG CAPS Take 100 mg by mouth daily.   [name] desipramine  (NOPRAMIN) 100 MG tablet Take 100 mg by mouth at bedtime.   . enalapril (VASOTEC) 10 MG tablet Take 10 mg by mouth every morning.   . enzalutamide (XTANDI) 40 MG capsule Take 160 mg by mouth daily. Takes 4 capsules daily  . ferrous sulfate 325 (65 FE) MG tablet Take 325 mg by mouth daily.   Javier Docker Oil 300 MG CAPS Take 300 mg by mouth daily.  . meloxicam (MOBIC) 7.5 MG tablet   . Multiple Vitamin (MULTIVITAMIN WITH MINERALS) TABS tablet Take 1 tablet by mouth daily.  . pantoprazole (PROTONIX) 40 MG tablet Take 1 tablet (40 mg total) by mouth daily. 30 minutes before breakfast  . venlafaxine XR (EFFEXOR-XR) 75 MG 24 hr capsule Take 75 mg by mouth daily.   . [DISCONTINUED] leuprolide (LUPRON) 30 MG injection Inject 30 mg into the muscle every 3 (three) months.  . [DISCONTINUED] megestrol (MEGACE) 20 MG tablet Take 20 mg by mouth daily.   . [DISCONTINUED] UNABLE TO FIND Med Name: cholesterol medication- pt does not know the name of the med.     Allergies:   Penicillins   Social History  Tobacco Use  . Smoking status: Former Smoker    Packs/day: 0.25    Years: 43.00    Pack years: 10.75    Types: Cigarettes    Quit date: 02/28/2013    Years since quitting: 6.9  . Smokeless tobacco: Former Systems developer    Quit date: 06/30/1980  Vaping Use  . Vaping Use: Never used  Substance Use Topics  . Alcohol use: No  . Drug use: No     Family Hx: The patient's family history includes Colon polyps in his father; Diabetes in his brother, father, and mother; Heart disease in his mother; Hypertension in his brother, father, and mother. There is no history of Colon cancer.  ROS:   Please see the history of present illness.    All other systems are reviewed and are negative.   Prior CV studies:   The following studies were reviewed today:  Labs/Other Tests and Data Reviewed:    EKG: ECG was ordered today shows normal sinus rhythm, normal tracing.  Recent Labs: 06/08/2019: ALT 24; BUN 22; Creatinine,  Ser 0.95; Potassium 4.3; Sodium 141 hemoglobin A1c 6.2%  December 02, 2018 potassium 3.5, creatinine 0.5, normal liver function tests, PSA 0.67    Recent Lipid Panel December 02, 2017 LDL 101, HDL 31, cholesterol 159, triglycerides 134, TSH 1.78  June 08, 2019 LDL 97, HDL 35, triglycerides 207, total cholesterol 168  Wt Readings from Last 3 Encounters:  02/02/20 260 lb (117.9 kg)  01/31/20 258 lb 6.4 oz (117.2 kg)  06/03/19 256 lb 6.4 oz (116.3 kg)     Objective:    Vital Signs:  BP 130/80   Pulse 76   Ht 6\' 2"  (1.88 m)   Wt 258 lb 6.4 oz (117.2 kg)   SpO2 96%   BMI 33.18 kg/m     General: Alert, oriented x3, no distress, mildly obese Head: no evidence of trauma, PERRL, EOMI, no exophtalmos or lid lag, no myxedema, no xanthelasma; normal ears, nose and oropharynx Neck: normal jugular venous pulsations and no hepatojugular reflux; brisk carotid pulses without delay and no carotid bruits Chest: clear to auscultation, no signs of consolidation by percussion or palpation, normal fremitus, symmetrical and full respiratory excursions Cardiovascular: normal position and quality of the apical impulse, regular rhythm, normal first and second heart sounds, no murmurs, rubs or gallops Abdomen: no tenderness or distention, no masses by palpation, no abnormal pulsatility or arterial bruits, normal bowel sounds, no hepatosplenomegaly Extremities: no clubbing, cyanosis or edema; 2+ radial, ulnar and brachial pulses bilaterally; 2+ right femoral, posterior tibial and dorsalis pedis pulses; 2+ left femoral, posterior tibial and dorsalis pedis pulses; no subclavian or femoral bruits Neurological: grossly nonfocal Psych: Normal mood and affect   ASSESSMENT & PLAN:    1. Essential hypertension   2. Mixed hyperlipidemia   3. Diabetes mellitus type 2 in obese ([place])   4. OSA (obstructive sleep apnea)   5. Mild obesity   6. Prostate CA ([place])      1. HTN: Well-controlled.  I did not find the  difference between the blood pressure in his right upper extremity and left upper extremity. 2. HLP: LDL cholesterol is acceptable, but his low HDL and elevated triglycerides are expression of obesity/insulin resistance/diabetes. 3. DM: Do not have his most recent labs, but 10 months ago the hemoglobin A1c was in acceptable range. 4. OSA: Reports compliance with therapy and denies daytime hypersomnolence. 5. Fatigue: He is not anemic and most recent TSH was normal.  Might be related to antiandrogen therapy for prostate cancer. 6. Anemia: Resolved.  Most recent hemoglobin was 13.5. 7. Mild obesity: Encouraged him to keep trying to lose weight.  COVID-19 Education: The signs and symptoms of COVID-19 were discussed with the patient and how to seek care for testing (follow up with PCP or arrange E-visit).  The importance of social distancing was discussed today.  Medication Adjustments/Labs and Tests Ordered: Current medicines are reviewed at length with the patient today.  Concerns regarding medicines are outlined above.   Tests Ordered: Orders Placed This Encounter  Procedures  . EKG 12-Lead    Medication Changes: No orders of the defined types were placed in this encounter.  Patient Instructions  Medication Instructions:  NO CHANGE *If you need a refill on your cardiac medications before your next appointment, please call your pharmacy*   Lab Work: If you have labs (blood work) drawn today and your tests are completely normal, you will receive your results only by: [name] MyChart Message (if you have MyChart) OR . A paper copy in the mail If you have any lab test that is abnormal or we need to change your treatment, we will call you to review the results.   Follow-Up: At [facility], you and your health needs are our priority.  As part of our continuing mission to provide you with exceptional heart care, we have created designated Provider Care Teams.  These Care Teams include your  primary Cardiologist (physician) and Advanced Practice Providers (APPs -  Physician Assistants and Nurse Practitioners) who all work together to provide you with the care you need, when you need it.  We recommend signing up for the patient portal called "MyChart".  Sign up information is provided on this After Visit Summary.  MyChart is used to connect with patients for Virtual Visits (Telemedicine).  Patients are able to view lab/test results, encounter notes, upcoming appointments, etc.  Non-urgent messages can be sent to your provider as well.   To learn more about what you can do with MyChart, go to [email or web address].    Your next appointment:   12 month(s)  The format for your next appointment:   Either In Person or Virtual  Provider:   You may see Sanda Klein MD or one of the following Advanced Practice Providers on your designated Care Team:    Almyra Deforest, PA-C  Fabian Sharp, PA-C or   Roby Lofts, [place]       Disposition:  Follow up 12 months  Signed, Sanda Klein, MD  02/05/2020 1:24 PM    [place]

## 2020-02-22 DIAGNOSIS — M1991 Primary osteoarthritis, unspecified site: Secondary | ICD-10-CM | POA: Diagnosis not present

## 2020-02-22 DIAGNOSIS — E119 Type 2 diabetes mellitus without complications: Secondary | ICD-10-CM | POA: Diagnosis not present

## 2020-02-22 DIAGNOSIS — Z192 Hormone resistant malignancy status: Secondary | ICD-10-CM | POA: Diagnosis not present

## 2020-03-15 ENCOUNTER — Other Ambulatory Visit: Payer: Self-pay

## 2020-03-15 ENCOUNTER — Encounter (HOSPITAL_COMMUNITY)
Admission: RE | Admit: 2020-03-15 | Discharge: 2020-03-15 | Disposition: A | Discharge status: Home / Self Care | Payer: Medicare Other | Source: Ambulatory Visit | Attending: Urology | Admitting: Urology

## 2020-03-15 DIAGNOSIS — C61 Malignant neoplasm of prostate: Secondary | ICD-10-CM

## 2020-03-15 DIAGNOSIS — C799 Secondary malignant neoplasm of unspecified site: Secondary | ICD-10-CM | POA: Diagnosis not present

## 2020-03-15 MED ORDER — TECHNETIUM TC 99M MEDRONATE IV KIT
20.5000 | PACK | Freq: Once | INTRAVENOUS | Status: AC
  Administered 2020-03-15: 20.5 via INTRAVENOUS

## 2020-03-21 DIAGNOSIS — Z5111 Encounter for antineoplastic chemotherapy: Secondary | ICD-10-CM | POA: Diagnosis not present

## 2020-03-21 DIAGNOSIS — C799 Secondary malignant neoplasm of unspecified site: Secondary | ICD-10-CM | POA: Diagnosis not present

## 2020-03-23 DIAGNOSIS — E119 Type 2 diabetes mellitus without complications: Secondary | ICD-10-CM | POA: Diagnosis not present

## 2020-03-23 DIAGNOSIS — Z192 Hormone resistant malignancy status: Secondary | ICD-10-CM | POA: Diagnosis not present

## 2020-03-23 DIAGNOSIS — M1991 Primary osteoarthritis, unspecified site: Secondary | ICD-10-CM | POA: Diagnosis not present

## 2020-04-23 DIAGNOSIS — G4733 Obstructive sleep apnea (adult) (pediatric): Secondary | ICD-10-CM | POA: Diagnosis not present

## 2020-04-24 DIAGNOSIS — Z192 Hormone resistant malignancy status: Secondary | ICD-10-CM | POA: Diagnosis not present

## 2020-04-24 DIAGNOSIS — E119 Type 2 diabetes mellitus without complications: Secondary | ICD-10-CM | POA: Diagnosis not present

## 2020-04-24 DIAGNOSIS — M1991 Primary osteoarthritis, unspecified site: Secondary | ICD-10-CM | POA: Diagnosis not present

## 2020-05-08 DIAGNOSIS — E119 Type 2 diabetes mellitus without complications: Secondary | ICD-10-CM | POA: Diagnosis not present

## 2020-05-24 DIAGNOSIS — M1991 Primary osteoarthritis, unspecified site: Secondary | ICD-10-CM | POA: Diagnosis not present

## 2020-05-24 DIAGNOSIS — I10 Essential (primary) hypertension: Secondary | ICD-10-CM | POA: Diagnosis not present

## 2020-05-24 DIAGNOSIS — E119 Type 2 diabetes mellitus without complications: Secondary | ICD-10-CM | POA: Diagnosis not present

## 2020-10-30 ENCOUNTER — Encounter: Payer: Self-pay | Admitting: Internal Medicine

## 2020-12-18 ENCOUNTER — Telehealth: Payer: Self-pay

## 2020-12-18 ENCOUNTER — Other Ambulatory Visit: Payer: Self-pay

## 2020-12-18 ENCOUNTER — Encounter: Payer: Self-pay | Admitting: Internal Medicine

## 2020-12-18 ENCOUNTER — Ambulatory Visit (INDEPENDENT_AMBULATORY_CARE_PROVIDER_SITE_OTHER): Payer: Medicare Other | Admitting: Internal Medicine

## 2020-12-18 VITALS — BP 123/84 | HR 71 | Temp 97.9°F | Ht 74.0 in | Wt 266.4 lb

## 2020-12-18 DIAGNOSIS — Z86010 Personal history of colonic polyps: Secondary | ICD-10-CM

## 2020-12-18 DIAGNOSIS — R1319 Other dysphagia: Secondary | ICD-10-CM | POA: Diagnosis not present

## 2020-12-18 DIAGNOSIS — K219 Gastro-esophageal reflux disease without esophagitis: Secondary | ICD-10-CM

## 2020-12-18 NOTE — Progress Notes (Signed)
Primary Care Physician:  Redmond School, MD Primary Gastroenterologist:  Dr. Gala Romney  Pre-Procedure History & Physical: HPI:  [name] is a 65 y.o. male here for consideration of surveillance colonoscopy.  History multiple colonic adenomas removed in 2019.  He is not having any lower GI tract symptoms at this time.  History of esophageal stricture dilated previously.  Dysphagia improved.  Reflux well controlled on pantoprazole 40 mg daily.  Has known metastatic prostate cancer with peritoneal implants.  History of radiation proctitis.  Has not been hospitalized since his 2019 colonoscopy.  He has a history of sleep apnea and uses CPAP every night.  He is not anticoagulated.  Past Medical History:  Diagnosis Date  . Anemia   . Anxiety   . Arthritis   . Bladder stone   . Cancer [facility]) 2007   prostate CA  . COPD (chronic obstructive pulmonary disease) ([place])   . Decreased pulse 08/16/2008   LE doppler - bilateral ABIs normal, bilateral PVRs normal waveform; bilateral common iliac arteries difficult to visualize however velocities suggest less than 50% diameter reduction; bilateral LE normal velocities w/o evidence of diameter reduction  . Depression   . GERD (gastroesophageal reflux disease)   . History of kidney stones   . Hypercholesteremia   . Hypertension   . Mental disorder    PTSD  . Shortness of breath   . Sleep apnea    wears CPAP nightly, settings at 14   . Tobacco abuse   . Urinary incontinence    due to prostate removal    Past Surgical History:  Procedure Laterality Date  . ANTERIOR LAT LUMBAR FUSION N/A 11/17/2012   Procedure: XLIF L3 - L4 POSTERIOR L3 -L4 FUSION INSTRUMENTATION/L2 - L3 DECOMPRESSION (REVISION OF POSTERIOR HARDWARE) 1 LEVEL;  Surgeon: Melina Schools, MD;  Location: [place];  Service: Orthopedics;  Laterality: N/A;  procedure #1: start 0923, end 1057.   [name] BACK SURGERY  2010 / 2014  . BLADDER SUSPENSION     x 2  . CARDIAC CATHETERIZATION      no PCI  . CARDIOVASCULAR STRESS TEST  08/16/2008   EF 57%; inferior region has diaphragmatic attenuation, otherwise normal perfusion w/o evidence of ischemia or infarct; LV normal in size; no scintographic evidence of inducible ischemia; no ECG changes, negative for ischemia; low risk scan  . CERVICAL FUSION    . COLON RESECTION N/A 07/12/2013   peritoneal biopsy only, evidence of metastatic prostate cancer  . COLONOSCOPY    . COLONOSCOPY N/A 05/27/2013   RMR: Extrinsic mass effect at the level of the appendiceal orifice likely represents an appendiceal or periappendiceal process. Neovascular changes of the rectal mucosa consistent with prior history of radiation treatment. Multiple 3-5 mm polyps removed, several tubular adenomas. Diverticulosis.nex tcs 2019  . COLONOSCOPY WITH PROPOFOL N/A 07/05/2018   Procedure: COLONOSCOPY WITH PROPOFOL;  Surgeon: Daneil Dolin, MD;  [facility];  Service: Endoscopy;  Laterality: N/A;  9:30am  . CYSTOSCOPY W/ URETERAL STENT PLACEMENT N/A 10/03/2013   Procedure: CYSTOSCOPY AND REMOVAL OF BLADDER NECK STONE ;  Surgeon: Dutch Gray, MD;  [facility];  Service: Urology;  Laterality: N/A;  WITH REMOVAL OF BLADDER NECK STONE    . ESOPHAGOGASTRODUODENOSCOPY (EGD) WITH ESOPHAGEAL DILATION N/A 05/27/2013   RMR: Mild erosive reflux esophagitis with soft noncritical appearing stricture status post Maloney dilation, antral erosions with reactive gastropathy but no H. pylori  . ESOPHAGOGASTRODUODENOSCOPY (EGD) WITH PROPOFOL N/A 07/05/2018  Procedure: ESOPHAGOGASTRODUODENOSCOPY (EGD) WITH PROPOFOL;  Surgeon: Daneil Dolin, MD;  [facility];  Service: Endoscopy;  Laterality: N/A;  . HOLMIUM LASER APPLICATION N/A 08/30/1094   Procedure: HOLMIUM LASER APPLICATION;  Surgeon: Dutch Gray, MD;  [facility];  Service: Urology;  Laterality: N/A;  . Venia Minks DILATION N/A 07/05/2018   Procedure: Venia Minks DILATION;  Surgeon: Daneil Dolin, MD;   [facility];  Service: Endoscopy;  Laterality: N/A;  . POLYPECTOMY  07/05/2018   Procedure: POLYPECTOMY;  Surgeon: Daneil Dolin, MD;  [facility];  Service: Endoscopy;;  cecal polyp , splenic flexure polyps x4  . PROSTATE SURGERY    . TRANSTHORACIC ECHOCARDIOGRAM  08/16/2008   essentially normal for age  . URINARY SPHINCTER IMPLANT N/A 01/31/2014   Procedure: IMPLANTATION ARTIFICIAL SPHINCTER  WITH CYSTOSCOPY;  Surgeon: Reece Packer, MD;  [facility];  Service: Urology;  Laterality: N/A;  . WRIST SURGERY Right     Prior to Admission medications   Medication Sig Start Date End Date Taking? Authorizing Provider  ARIPiprazole (ABILIFY) 10 MG tablet Take 10 mg by mouth daily. 04/13/18  Yes [provider]  Choline Fenofibrate 135 MG capsule Take 135 mg by mouth daily.   Yes [provider]  Coenzyme Q10 (CO Q 10) 100 MG CAPS Take 100 mg by mouth daily.    Yes [provider]  desipramine (NOPRAMIN) 100 MG tablet Take 100 mg by mouth at bedtime.  09/04/14  Yes [provider]  enalapril (VASOTEC) 10 MG tablet Take 10 mg by mouth every morning.    Yes [provider]  enzalutamide (XTANDI) 40 MG capsule Take 160 mg by mouth daily. Takes 4 capsules daily   Yes [provider]  ferrous sulfate 325 (65 FE) MG tablet Take 325 mg by mouth daily.  06/30/14  Yes [provider]  Javier Docker Oil 300 MG CAPS Take 300 mg by mouth daily.   Yes [provider]  meloxicam (MOBIC) 7.5 MG tablet Take 7.5 mg by mouth. 04/18/19  Yes [provider]  Multiple Vitamin (MULTIVITAMIN WITH MINERALS) TABS tablet Take 1 tablet by mouth daily.   Yes [provider]  pantoprazole (PROTONIX) 40 MG tablet Take 1 tablet (40 mg total) by mouth daily. 30 minutes before breakfast 05/21/18  Yes Annitta Needs, NP  rosuvastatin (CRESTOR) 20 MG tablet Take 20 mg by mouth daily.   Yes [provider]   venlafaxine XR (EFFEXOR-XR) 75 MG 24 hr capsule Take 75 mg by mouth daily.  10/03/14  Yes [provider]    Allergies as of 12/18/2020 - Review Complete 12/18/2020  Allergen Reaction Noted  . Penicillins Anaphylaxis 09/30/2011    Family History  Problem Relation Age of Onset  . Diabetes Mother   . Hypertension Mother   . Heart disease Mother   . Diabetes Father   . Hypertension Father   . Colon polyps Father        age 81  . Hypertension Brother   . Diabetes Brother   . Colon cancer Neg Hx     Social History   Socioeconomic History  . Marital status: Married    Spouse name: Not on file  . Number of children: Not on file  . Years of education: Not on file  . Highest education level: Not on file  Occupational History  . Occupation: driving truck    Comment: retired  Tobacco Use  . Smoking  status: Former Smoker    Packs/day: 0.25    Years: 43.00    Pack years: 10.75    Types: Cigarettes    Quit date: 02/28/2013    Years since quitting: 7.8  . Smokeless tobacco: Former Systems developer    Quit date: 06/30/1980  Vaping Use  . Vaping Use: Never used  Substance and Sexual Activity  . Alcohol use: No  . Drug use: No  . Sexual activity: Yes    Birth control/protection: None  Other Topics Concern  . Not on file  Social History Narrative  . Not on file   Social Determinants of Health   Financial Resource Strain: Not on file  Food Insecurity: Not on file  Transportation Needs: Not on file  Physical Activity: Not on file  Stress: Not on file  Social Connections: Not on file  Intimate Partner Violence: Not on file    Review of Systems: See HPI, otherwise negative ROS  Physical Exam: BP 123/84   Pulse 71   Temp 97.9 F (36.6 C)   Ht 6\' 2"  (1.88 m)   Wt 266 lb 6.4 oz (120.8 kg)   BMI 34.20 kg/m  General:   Alert,   pleasant and cooperative in NAD Neck:  Supple; no masses or thyromegaly. No significant cervical adenopathy. Lungs:  Clear throughout to  auscultation.   No wheezes, crackles, or rhonchi. No acute distress. Heart:  Regular rate and rhythm; no murmurs, clicks, rubs,  or gallops. Abdomen: Non-distended, normal bowel sounds.  Soft and nontender without appreciable mass or hepatosplenomegaly.  Pulses:  Normal pulses noted. Extremities:  Without clubbing or edema.  Impression/Plan:  65 year old gentleman history of multiple colonic adenomas removed previously.  Here for surveillance colonoscopy.  Co-morbidities include metastatic prostate cancer and sleep apnea.  Radiation proctitis noted on prior colonoscopy.  GERD well-controlled on Protonix.  Dysphagia no longer an issue since he had his stricture dilated.  Recommendations:  I have offered this gentleman a surveillance colonoscopy.  The risks, benefits, limitations, alternatives and imponderables have been reviewed with the patient. Questions have been answered. All parties are agreeable.   ASA 4.  Propofol.  Continue Protonix 40mg  daily before breakfast.  Further recommendations to follow.     Notice: This dictation was prepared with Dragon dictation along with smaller phrase technology. Any transcriptional errors that result from this process are unintentional and may not be corrected upon review.

## 2020-12-18 NOTE — Telephone Encounter (Signed)
Will call pt to schedule TCS w/Propofol w/Dr. Gala Romney ASA 4 when his July schedule is available.

## 2020-12-18 NOTE — Patient Instructions (Signed)
We will set up a surveillance colonoscopy (history of colonic polyps) propofol  /  4.  Continue Protonix 40 mg each daily for acid reflux.  Further recommendations to follow.

## 2021-01-28 ENCOUNTER — Encounter: Payer: Self-pay | Admitting: *Deleted

## 2021-01-28 ENCOUNTER — Telehealth: Payer: Self-pay | Admitting: *Deleted

## 2021-01-28 MED ORDER — PEG 3350-KCL-NA BICARB-NACL 420 G PO SOLR: ORAL | 0 refills | Status: DC

## 2021-01-28 NOTE — Telephone Encounter (Signed)
Patient returned call. He has been scheduled for 7/14 at 2:30pm. Aware will mail prep instructions with pre-op appt. Confirmed address. Confirmed pharmacy.

## 2021-01-28 NOTE — Telephone Encounter (Signed)
Duplicate, erro

## 2021-01-28 NOTE — Telephone Encounter (Signed)
LMOVM to call back to schedule 

## 2021-01-28 NOTE — Addendum Note (Signed)
Addended by: Cheron Every on: 01/28/2021 02:10 PM   Modules accepted: Orders

## 2021-02-26 NOTE — Patient Instructions (Signed)
[name]  02/26/2021     @PREFPERIOPPHARMACY @   Your procedure is scheduled on  03/07/2021.   Report to [facility] at  1230  P.M.   Call this number if you have problems the morning of surgery:  [phone]   Remember:  Follow the diet and prep instructions given to you by the office.    Take these medicines the morning of surgery with A SIP OF WATER        xtandi, protonix, effexor.     Do not wear jewelry, make-up or nail polish.  Do not wear lotions, powders, or perfumes, or deodorant.  Do not shave 48 hours prior to surgery.  Men may shave face and neck.  Do not bring valuables to the hospital.  [facility] is not responsible for any belongings or valuables.  Contacts, dentures or bridgework may not be worn into surgery.  Leave your suitcase in the car.  After surgery it may be brought to your room.  For patients admitted to the hospital, discharge time will be determined by your treatment team.  Patients discharged the day of surgery will not be allowed to drive home and must have someone with them for 24 hours.    Special instructions:   DO NOT smoke tobacco or vape for 24 hours before your procedure.  Please read over the following fact sheets that you were given. Anesthesia Post-op Instructions and Care and Recovery After Surgery      Colonoscopy, Adult, Care After This sheet gives you information about how to care for yourself after your procedure. Your health care provider may also give you more specific instructions. If you have problems or questions, contact your health careprovider. What can I expect after the procedure? After the procedure, it is common to have: A small amount of blood in your stool for 24 hours after the procedure. Some gas. Mild cramping or bloating of your abdomen. Follow these instructions at home: Eating and drinking  Drink enough fluid to keep your urine pale yellow. Follow instructions from your health care  provider about eating or drinking restrictions. Resume your normal diet as instructed by your health care provider. Avoid heavy or fried foods that are hard to digest.  Activity Rest as told by your health care provider. Avoid sitting for a long time without moving. Get up to take short walks every 1-2 hours. This is important to improve blood flow and breathing. Ask for help if you feel weak or unsteady. Return to your normal activities as told by your health care provider. Ask your health care provider what activities are safe for you. Managing cramping and bloating  Try walking around when you have cramps or feel bloated. Apply heat to your abdomen as told by your health care provider. Use the heat source that your health care provider recommends, such as a moist heat pack or a heating pad. Place a towel between your skin and the heat source. Leave the heat on for 20-30 minutes. Remove the heat if your skin turns bright red. This is especially important if you are unable to feel pain, heat, or cold. You may have a greater risk of getting burned.  General instructions If you were given a sedative during the procedure, it can affect you for several hours. Do not drive or operate machinery until your health care provider says that it is safe. For the first 24 hours after the procedure: Do not sign  important documents. Do not drink alcohol. Do your regular daily activities at a slower pace than normal. Eat soft foods that are easy to digest. Take over-the-counter and prescription medicines only as told by your health care provider. Keep all follow-up visits as told by your health care provider. This is important. Contact a health care provider if: You have blood in your stool 2-3 days after the procedure. Get help right away if you have: More than a small spotting of blood in your stool. Large blood clots in your stool. Swelling of your abdomen. Nausea or vomiting. A fever. Increasing  pain in your abdomen that is not relieved with medicine. Summary After the procedure, it is common to have a small amount of blood in your stool. You may also have mild cramping and bloating of your abdomen. If you were given a sedative during the procedure, it can affect you for several hours. Do not drive or operate machinery until your health care provider says that it is safe. Get help right away if you have a lot of blood in your stool, nausea or vomiting, a fever, or increased pain in your abdomen. This information is not intended to replace advice given to you by your health care provider. Make sure you discuss any questions you have with your healthcare provider. Document Revised: 08/05/2019 Document Reviewed: 03/07/2019 Elsevier Patient Education  [place] After This sheet gives you information about how to care for yourself after your procedure. Your health care provider may also give you more specific instructions. If you have problems or questions, contact your health careprovider. What can I expect after the procedure? After the procedure, it is common to have: Tiredness. Forgetfulness about what happened after the procedure. Impaired judgment for important decisions. Nausea or vomiting. Some difficulty with balance. Follow these instructions at home: For the time period you were told by your health care provider:     Rest as needed. Do not participate in activities where you could fall or become injured. Do not drive or use machinery. Do not drink alcohol. Do not take sleeping pills or medicines that cause drowsiness. Do not make important decisions or sign legal documents. Do not take care of children on your own. Eating and drinking Follow the diet that is recommended by your health care provider. Drink enough fluid to keep your urine pale yellow. If you vomit: Drink water, juice, or soup when you can drink without  vomiting. Make sure you have little or no nausea before eating solid foods. General instructions Have a responsible adult stay with you for the time you are told. It is important to have someone help care for you until you are awake and alert. Take over-the-counter and prescription medicines only as told by your health care provider. If you have sleep apnea, surgery and certain medicines can increase your risk for breathing problems. Follow instructions from your health care provider about wearing your sleep device: Anytime you are sleeping, including during daytime naps. While taking prescription pain medicines, sleeping medicines, or medicines that make you drowsy. Avoid smoking. Keep all follow-up visits as told by your health care provider. This is important. Contact a health care provider if: You keep feeling nauseous or you keep vomiting. You feel light-headed. You are still sleepy or having trouble with balance after 24 hours. You develop a rash. You have a fever. You have redness or swelling around the IV site. Get help right away if: You have  trouble breathing. You have new-onset confusion at home. Summary For several hours after your procedure, you may feel tired. You may also be forgetful and have poor judgment. Have a responsible adult stay with you for the time you are told. It is important to have someone help care for you until you are awake and alert. Rest as told. Do not drive or operate machinery. Do not drink alcohol or take sleeping pills. Get help right away if you have trouble breathing, or if you suddenly become confused. This information is not intended to replace advice given to you by your health care provider. Make sure you discuss any questions you have with your healthcare provider. Document Revised: 04/26/2020 Document Reviewed: 07/14/2019 Elsevier Patient Education  2022 [employer].

## 2021-02-26 NOTE — Progress Notes (Signed)
Cardiology office note   Date:  02/27/2021   ID:  [name], DOB Feb 17, 1956, MRN [id number] PCP:  Redmond School, MD  Cardiologist:  Pete Merten Electrophysiologist:  None   Evaluation Performed:  Follow-Up Visit  Chief Complaint: Dyspnea  History of Present Illness:    [name] is a 65 y.o. male with multiple obesity related complications including type 2 diabetes mellitus, hypercholesterolemia, essential hypertension, obstructive sleep apnea, also with COPD related to previous smoking (quit 2014) and widespread metastatic prostate cancer that is well controlled on antiandrogen therapy. Angiography in 2011 showed normal coronaries.  He has developed mild shortness of breath with activity.  Has to rest after climbing a flight of stairs.  His wife had problems of heart failure and he is to struggle to keep up with him, now the opposite is occurring.  He does not have orthopnea, PND, lower extremity edema.  He has occasional wheezing and uses his wife's albuterol, which helps.  He reports 100% compliance with CPAP and denies daytime hypersomnolence.  He has not had palpitations, syncope, dizziness or focal neurological complaints.  He denies claudication.  He reports compliance with CPAP and denies daytime hypersomnolence.  Labs performed February 22, 2021 showed hemoglobin 12.9, TSH 1.5, creatinine 0.6.  Most recent hemoglobin A1c was 6.4%.    Past Medical History:  Diagnosis Date   Anemia    Anxiety    Arthritis    Bladder stone    Cancer ([place]) 2007   prostate CA   COPD (chronic obstructive pulmonary disease) (HCC)    Decreased pulse 08/16/2008   LE doppler - bilateral ABIs normal, bilateral PVRs normal waveform; bilateral common iliac arteries difficult to visualize however velocities suggest less than 50% diameter reduction; bilateral LE normal velocities w/o evidence of diameter reduction   Depression    GERD (gastroesophageal reflux disease)    History of kidney stones     Hypercholesteremia    Hypertension    Mental disorder    PTSD   Shortness of breath    Sleep apnea    wears CPAP nightly, settings at 14    Tobacco abuse    Urinary incontinence    due to prostate removal   Past Surgical History:  Procedure Laterality Date   ANTERIOR LAT LUMBAR FUSION N/A 11/17/2012   Procedure: XLIF L3 - L4 POSTERIOR L3 -L4 FUSION INSTRUMENTATION/L2 - L3 DECOMPRESSION (REVISION OF POSTERIOR HARDWARE) 1 LEVEL;  Surgeon: Melina Schools, MD;  Location: [place];  Service: Orthopedics;  Laterality: N/A;  procedure #1: start 0923, end 1057.    BACK SURGERY  2010 / 2014   BLADDER SUSPENSION     x 2   CARDIAC CATHETERIZATION     no PCI   CARDIOVASCULAR STRESS TEST  08/16/2008   EF 57%; inferior region has diaphragmatic attenuation, otherwise normal perfusion w/o evidence of ischemia or infarct; LV normal in size; no scintographic evidence of inducible ischemia; no ECG changes, negative for ischemia; low risk scan   CERVICAL FUSION     COLON RESECTION N/A 07/12/2013   peritoneal biopsy only, evidence of metastatic prostate cancer   COLONOSCOPY     COLONOSCOPY N/A 05/27/2013   RMR: Extrinsic mass effect at the level of the appendiceal orifice likely represents an appendiceal or periappendiceal process. Neovascular changes of the rectal mucosa consistent with prior history of radiation treatment. Multiple 3-5 mm polyps removed, several tubular adenomas. Diverticulosis.nex tcs 2019   COLONOSCOPY WITH PROPOFOL N/A 07/05/2018   Procedure: COLONOSCOPY  WITH PROPOFOL;  Surgeon: Daneil Dolin, MD;  [facility];  Service: Endoscopy;  Laterality: N/A;  9:30am   CYSTOSCOPY W/ URETERAL STENT PLACEMENT N/A 10/03/2013   Procedure: CYSTOSCOPY AND REMOVAL OF BLADDER NECK STONE ;  Surgeon: Dutch Gray, MD;  Location: [facility];  Service: Urology;  Laterality: N/A;  WITH REMOVAL OF BLADDER NECK STONE     ESOPHAGOGASTRODUODENOSCOPY (EGD) WITH ESOPHAGEAL DILATION N/A 05/27/2013   RMR: Mild  erosive reflux esophagitis with soft noncritical appearing stricture status post Maloney dilation, antral erosions with reactive gastropathy but no H. pylori   ESOPHAGOGASTRODUODENOSCOPY (EGD) WITH PROPOFOL N/A 07/05/2018   Procedure: ESOPHAGOGASTRODUODENOSCOPY (EGD) WITH PROPOFOL;  Surgeon: Daneil Dolin, MD;  [facility];  Service: Endoscopy;  Laterality: N/A;   HOLMIUM LASER APPLICATION N/A 09/26/252   Procedure: HOLMIUM LASER APPLICATION;  Surgeon: Dutch Gray, MD;  Location: [facility];  Service: Urology;  Laterality: N/A;   MALONEY DILATION N/A 07/05/2018   Procedure: Venia Minks DILATION;  Surgeon: Daneil Dolin, MD;  [facility];  Service: Endoscopy;  Laterality: N/A;   POLYPECTOMY  07/05/2018   Procedure: POLYPECTOMY;  Surgeon: Daneil Dolin, MD;  [facility];  Service: Endoscopy;;  cecal polyp , splenic flexure polyps x4   PROSTATE SURGERY     TRANSTHORACIC ECHOCARDIOGRAM  08/16/2008   essentially normal for age   URINARY SPHINCTER IMPLANT N/A 01/31/2014   Procedure: IMPLANTATION ARTIFICIAL SPHINCTER  WITH CYSTOSCOPY;  Surgeon: Reece Packer, MD;  Location[facility];  Service: Urology;  Laterality: N/A;   WRIST SURGERY Right      Current Meds  Medication Sig   ARIPiprazole (ABILIFY) 10 MG tablet Take 10 mg by mouth at bedtime.   Choline Fenofibrate 135 MG capsule Take 135 mg by mouth at bedtime. trilipix   Coenzyme Q10 (CO Q 10) 100 MG CAPS Take 100 mg by mouth daily.    desipramine (NOPRAMIN) 100 MG tablet Take 100 mg by mouth at bedtime.    enalapril (VASOTEC) 10 MG tablet Take 10 mg by mouth every morning.    enzalutamide (XTANDI) 40 MG capsule Take 160 mg by mouth daily. Takes 4 capsules daily   ferrous sulfate 325 (65 FE) MG tablet Take 325 mg by mouth daily.    Krill Oil 300 MG CAPS Take 300 mg by mouth daily.   meloxicam (MOBIC) 7.5 MG tablet Take 7.5 mg by mouth 2 (two) times daily.   Multiple Vitamin (MULTIVITAMIN WITH MINERALS) TABS  tablet Take 1 tablet by mouth daily.   pantoprazole (PROTONIX) 40 MG tablet Take 1 tablet (40 mg total) by mouth daily. 30 minutes before breakfast   polyethylene glycol-electrolytes (NULYTELY) 420 g solution As directed   venlafaxine XR (EFFEXOR-XR) 75 MG 24 hr capsule Take 75 mg by mouth daily.    [DISCONTINUED] rosuvastatin (CRESTOR) 20 MG tablet Take 20 mg by mouth daily.     Allergies:   Penicillins   Social History   Tobacco Use   Smoking status: Former    Packs/day: 0.25    Years: 43.00    Pack years: 10.75    Types: Cigarettes    Quit date: 02/28/2013    Years since quitting: 8.0   Smokeless tobacco: Former    Quit date: 06/30/1980  Vaping Use   Vaping Use: Never used  Substance Use Topics   Alcohol use: No   Drug use: No     Family Hx: The patient's family history includes Colon  polyps in his father; Diabetes in his brother, father, and mother; Heart disease in his mother; Hypertension in his brother, father, and mother. There is no history of Colon cancer.  ROS:   Please see the history of present illness.    All other systems are reviewed and are negative.   Prior CV studies:   The following studies were reviewed today:  Labs/Other Tests and Data Reviewed:    EKG: Ordered today and personally reviewed is a completely normal tracing, normal sinus rhythm  Recent Labs: No results found for requested labs within last 8760 hours.  02/22/2021 Hemoglobin 12.9, TSH 1.5, creatinine 0.6.  Most recent hemoglobin A1c was 6.4%.  Recent Lipid Panel December 02, 2017 LDL 101, HDL 31, cholesterol 159, triglycerides 134, TSH 1.78  June 08, 2019 LDL 97, HDL 35, triglycerides 207, total cholesterol 168  February 22, 2021 LDL 112, HDL 37, triglycerides 181, total cholesterol 181  Wt Readings from Last 3 Encounters:  02/27/21 273 lb 12.8 oz (124.2 kg)  12/18/20 266 lb 6.4 oz (120.8 kg)  02/02/20 260 lb (117.9 kg)     Objective:    Vital Signs:  BP 136/76   Pulse 72   Ht  6\' 2"  (1.88 m)   Wt 273 lb 12.8 oz (124.2 kg)   SpO2 99%   BMI 35.15 kg/m     General: Alert, oriented x3, no distress, moderately obese Head: no evidence of trauma, PERRL, EOMI, no exophtalmos or lid lag, no myxedema, no xanthelasma; normal ears, nose and oropharynx Neck: normal jugular venous pulsations and no hepatojugular reflux; brisk carotid pulses without delay and no carotid bruits Chest: clear to auscultation, no signs of consolidation by percussion or palpation, normal fremitus, symmetrical and full respiratory excursions Cardiovascular: normal position and quality of the apical impulse, regular rhythm, normal first and second heart sounds, no murmurs, rubs or gallops Abdomen: no tenderness or distention, no masses by palpation, no abnormal pulsatility or arterial bruits, normal bowel sounds, no hepatosplenomegaly Extremities: no clubbing, cyanosis or edema; 2+ radial, ulnar and brachial pulses bilaterally; 2+ right femoral, posterior tibial and dorsalis pedis pulses; 2+ left femoral, posterior tibial and dorsalis pedis pulses; no subclavian or femoral bruits Neurological: grossly nonfocal Psych: Normal mood and affect    ASSESSMENT & PLAN:    1. Shortness of breath   2. Essential hypertension   3. Mixed hyperlipidemia   4. Diabetes mellitus type 2 in obese ([place])   5. OSA (obstructive sleep apnea)   6. Mild obesity       Exertional dyspnea: He has not had an echocardiogram since 2009 and we will recheck.  Could be related to COPD, but he has not smoked in 8 years.  He may require formal pulmonary consultation to improve his bronchodilator prescription.   HTN: Well-controlled. HLP: LDL is too high for somebody with diabetes.  Reviewed dietary instructions and recommended increasing rosuvastatin to 40 mg daily. DM: Fair control. OSA: Reports 100% compliance with CPAP.  Denies daytime hypersomnolence.  Last appt w Dr. Claiborne Billings in June 2021. Moderate obesity: He has gained  weight and this may explain worsening lipid profile and even his worsening dyspnea.  Try to at least lose the pounds that he has gained the last couple of years.  COVID-19 Education: The signs and symptoms of COVID-19 were discussed with the patient and how to seek care for testing (follow up with PCP or arrange E-visit).  The importance of social distancing was discussed today.  Medication  Adjustments/Labs and Tests Ordered: Current medicines are reviewed at length with the patient today.  Concerns regarding medicines are outlined above.   Tests Ordered: Orders Placed This Encounter  Procedures   EKG 12-Lead   ECHOCARDIOGRAM COMPLETE     Medication Changes: Meds ordered this encounter  Medications   rosuvastatin (CRESTOR) 40 MG tablet    Sig: Take 1 tablet (40 mg total) by mouth daily.    Dispense:  90 tablet    Refill:  3    Patient Instructions  Medication Instructions:  INCREASE the Rosuvastatin to 40 mg once daily  *If you need a refill on your cardiac medications before your next appointment, please call your pharmacy*   Lab Work: None ordered If you have labs (blood work) drawn today and your tests are completely normal, you will receive your results only by: [place] (if you have MyChart) OR A paper copy in the mail If you have any lab test that is abnormal or we need to change your treatment, we will call you to review the results.   Testing/Procedures: Your physician has requested that you have an echocardiogram. Echocardiography is a painless test that uses sound waves to create images of your heart. It provides your doctor with information about the size and shape of your heart and how well your heart's chambers and valves are working. You may receive an ultrasound enhancing agent through an IV if needed to better visualize your heart during the echo.This procedure takes approximately one hour. There are no restrictions for this procedure.     Follow-Up: At [facility], you and your health needs are our priority.  As part of our continuing mission to provide you with exceptional heart care, we have created designated Provider Care Teams.  These Care Teams include your primary Cardiologist (physician) and Advanced Practice Providers (APPs -  Physician Assistants and Nurse Practitioners) who all work together to provide you with the care you need, when you need it.  We recommend signing up for the patient portal called "MyChart".  Sign up information is provided on this After Visit Summary.  MyChart is used to connect with patients for Virtual Visits (Telemedicine).  Patients are able to view lab/test results, encounter notes, upcoming appointments, etc.  Non-urgent messages can be sent to your provider as well.   To learn more about what you can do with MyChart, go to [email or web address].    Your next appointment:   12 month(s)  The format for your next appointment:   In Person  Provider:   Sanda Klein, MD    Signed, Sanda Klein, MD  02/27/2021 9:36 AM    [place]

## 2021-02-27 ENCOUNTER — Encounter: Payer: Self-pay | Admitting: Cardiovascular Disease

## 2021-02-27 ENCOUNTER — Ambulatory Visit (INDEPENDENT_AMBULATORY_CARE_PROVIDER_SITE_OTHER): Payer: Medicare Other | Admitting: Cardiovascular Disease

## 2021-02-27 ENCOUNTER — Other Ambulatory Visit: Payer: Self-pay

## 2021-02-27 VITALS — BP 136/76 | HR 72 | Ht 74.0 in | Wt 273.8 lb

## 2021-02-27 DIAGNOSIS — E782 Mixed hyperlipidemia: Secondary | ICD-10-CM | POA: Diagnosis not present

## 2021-02-27 DIAGNOSIS — I10 Essential (primary) hypertension: Secondary | ICD-10-CM | POA: Diagnosis not present

## 2021-02-27 DIAGNOSIS — R0602 Shortness of breath: Secondary | ICD-10-CM

## 2021-02-27 DIAGNOSIS — E669 Obesity, unspecified: Secondary | ICD-10-CM

## 2021-02-27 DIAGNOSIS — E1169 Type 2 diabetes mellitus with other specified complication: Secondary | ICD-10-CM

## 2021-02-27 DIAGNOSIS — G4733 Obstructive sleep apnea (adult) (pediatric): Secondary | ICD-10-CM

## 2021-02-27 MED ORDER — ROSUVASTATIN CALCIUM 40 MG PO TABS: 40.0000 mg | ORAL_TABLET | Freq: Every day | ORAL | 3 refills | Status: DC

## 2021-02-27 NOTE — Patient Instructions (Signed)
Medication Instructions:  INCREASE the Rosuvastatin to 40 mg once daily  *If you need a refill on your cardiac medications before your next appointment, please call your pharmacy*   Lab Work: None ordered If you have labs (blood work) drawn today and your tests are completely normal, you will receive your results only by: [place] (if you have MyChart) OR A paper copy in the mail If you have any lab test that is abnormal or we need to change your treatment, we will call you to review the results.   Testing/Procedures: Your physician has requested that you have an echocardiogram. Echocardiography is a painless test that uses sound waves to create images of your heart. It provides your doctor with information about the size and shape of your heart and how well your heart's chambers and valves are working. You may receive an ultrasound enhancing agent through an IV if needed to better visualize your heart during the echo.This procedure takes approximately one hour. There are no restrictions for this procedure.    Follow-Up: At [facility], you and your health needs are our priority.  As part of our continuing mission to provide you with exceptional heart care, we have created designated Provider Care Teams.  These Care Teams include your primary Cardiologist (physician) and Advanced Practice Providers (APPs -  Physician Assistants and Nurse Practitioners) who all work together to provide you with the care you need, when you need it.  We recommend signing up for the patient portal called "MyChart".  Sign up information is provided on this After Visit Summary.  MyChart is used to connect with patients for Virtual Visits (Telemedicine).  Patients are able to view lab/test results, encounter notes, upcoming appointments, etc.  Non-urgent messages can be sent to your provider as well.   To learn more about what you can do with MyChart, go to [email or web address].    Your next  appointment:   12 month(s)  The format for your next appointment:   In Person  Provider:   Sanda Klein, MD

## 2021-02-28 ENCOUNTER — Encounter (HOSPITAL_COMMUNITY): Payer: Self-pay

## 2021-02-28 ENCOUNTER — Other Ambulatory Visit: Payer: Self-pay

## 2021-02-28 ENCOUNTER — Encounter (HOSPITAL_COMMUNITY)
Admission: RE | Admit: 2021-02-28 | Discharge: 2021-02-28 | Disposition: A | Discharge status: Home / Self Care | Payer: Medicare Other | Source: Ambulatory Visit | Attending: Internal Medicine | Admitting: Internal Medicine

## 2021-02-28 DIAGNOSIS — Z01818 Encounter for other preprocedural examination: Secondary | ICD-10-CM | POA: Insufficient documentation

## 2021-02-28 HISTORY — DX: Prediabetes: R73.03

## 2021-02-28 LAB — CBC WITH DIFFERENTIAL/PLATELET
Abs Immature Granulocytes: 0.01 10*3/uL (ref 0.00–0.07)
Basophils Absolute: 0 10*3/uL (ref 0.0–0.1)
Basophils Relative: 1 %
Eosinophils Absolute: 0 10*3/uL (ref 0.0–0.5)
Eosinophils Relative: 0 %
HCT: 37.4 % — ABNORMAL LOW (ref 39.0–52.0)
Hemoglobin: 12 g/dL — ABNORMAL LOW (ref 13.0–17.0)
Immature Granulocytes: 0 %
Lymphocytes Relative: 35 %
Lymphs Abs: 1.6 10*3/uL (ref 0.7–4.0)
MCH: 32.8 pg (ref 26.0–34.0)
MCHC: 32.1 g/dL (ref 30.0–36.0)
MCV: 102.2 fL — ABNORMAL HIGH (ref 80.0–100.0)
Monocytes Absolute: 0.5 10*3/uL (ref 0.1–1.0)
Monocytes Relative: 10 %
Neutro Abs: 2.5 10*3/uL (ref 1.7–7.7)
Neutrophils Relative %: 54 %
Platelets: 307 10*3/uL (ref 150–400)
RBC: 3.66 MIL/uL — ABNORMAL LOW (ref 4.22–5.81)
RDW: 12.6 % (ref 11.5–15.5)
WBC: 4.5 10*3/uL (ref 4.0–10.5)
nRBC: 0 % (ref 0.0–0.2)

## 2021-03-07 ENCOUNTER — Other Ambulatory Visit: Payer: Self-pay

## 2021-03-07 ENCOUNTER — Ambulatory Visit (HOSPITAL_COMMUNITY): Payer: Medicare Other | Admitting: Certified Registered"

## 2021-03-07 ENCOUNTER — Encounter (HOSPITAL_COMMUNITY): Payer: Self-pay | Admitting: Internal Medicine

## 2021-03-07 ENCOUNTER — Ambulatory Visit (HOSPITAL_COMMUNITY)
Admission: RE | Admit: 2021-03-07 | Discharge: 2021-03-07 | Disposition: A | Discharge status: Home / Self Care | Payer: Medicare Other | Attending: Internal Medicine | Admitting: Internal Medicine

## 2021-03-07 ENCOUNTER — Encounter (HOSPITAL_COMMUNITY)
Admission: RE | Disposition: A | Discharge status: Home / Self Care | Payer: Self-pay | Source: Home / Self Care | Attending: Internal Medicine

## 2021-03-07 DIAGNOSIS — Z880 Allergy status to penicillin: Secondary | ICD-10-CM | POA: Diagnosis not present

## 2021-03-07 DIAGNOSIS — Z87891 Personal history of nicotine dependence: Secondary | ICD-10-CM | POA: Diagnosis not present

## 2021-03-07 DIAGNOSIS — Z9079 Acquired absence of other genital organ(s): Secondary | ICD-10-CM | POA: Diagnosis not present

## 2021-03-07 DIAGNOSIS — Z86010 Personal history of colonic polyps: Secondary | ICD-10-CM | POA: Insufficient documentation

## 2021-03-07 DIAGNOSIS — Z8546 Personal history of malignant neoplasm of prostate: Secondary | ICD-10-CM | POA: Diagnosis not present

## 2021-03-07 DIAGNOSIS — K5730 Diverticulosis of large intestine without perforation or abscess without bleeding: Secondary | ICD-10-CM | POA: Insufficient documentation

## 2021-03-07 DIAGNOSIS — Z1211 Encounter for screening for malignant neoplasm of colon: Secondary | ICD-10-CM | POA: Diagnosis not present

## 2021-03-07 DIAGNOSIS — Z87892 Personal history of anaphylaxis: Secondary | ICD-10-CM | POA: Insufficient documentation

## 2021-03-07 DIAGNOSIS — D123 Benign neoplasm of transverse colon: Secondary | ICD-10-CM | POA: Diagnosis not present

## 2021-03-07 DIAGNOSIS — Z8371 Family history of colonic polyps: Secondary | ICD-10-CM | POA: Diagnosis not present

## 2021-03-07 DIAGNOSIS — K640 First degree hemorrhoids: Secondary | ICD-10-CM | POA: Diagnosis not present

## 2021-03-07 DIAGNOSIS — Z79899 Other long term (current) drug therapy: Secondary | ICD-10-CM | POA: Diagnosis not present

## 2021-03-07 DIAGNOSIS — K635 Polyp of colon: Secondary | ICD-10-CM | POA: Diagnosis not present

## 2021-03-07 DIAGNOSIS — Z791 Long term (current) use of non-steroidal anti-inflammatories (NSAID): Secondary | ICD-10-CM | POA: Insufficient documentation

## 2021-03-07 HISTORY — PX: POLYPECTOMY: SHX5525

## 2021-03-07 HISTORY — PX: COLONOSCOPY WITH PROPOFOL: SHX5780

## 2021-03-07 LAB — GLUCOSE, CAPILLARY: Glucose-Capillary: 127 mg/dL — ABNORMAL HIGH (ref 70–99)

## 2021-03-07 SURGERY — COLONOSCOPY WITH PROPOFOL
Anesthesia: General

## 2021-03-07 MED ORDER — PROPOFOL 500 MG/50ML IV EMUL
INTRAVENOUS | Status: DC | PRN
  Administered 2021-03-07: 100 ug/kg/min via INTRAVENOUS

## 2021-03-07 MED ORDER — LACTATED RINGERS IV SOLN: INTRAVENOUS | Status: DC

## 2021-03-07 MED ORDER — PHENYLEPHRINE 40 MCG/ML (10ML) SYRINGE FOR IV PUSH (FOR BLOOD PRESSURE SUPPORT)
PREFILLED_SYRINGE | INTRAVENOUS | Status: DC | PRN
  Administered 2021-03-07: 80 ug via INTRAVENOUS
  Administered 2021-03-07: 40 ug via INTRAVENOUS
  Administered 2021-03-07: 80 ug via INTRAVENOUS

## 2021-03-07 MED ORDER — PROPOFOL 10 MG/ML IV BOLUS
INTRAVENOUS | Status: DC | PRN
  Administered 2021-03-07: 30 mg via INTRAVENOUS
  Administered 2021-03-07: 100 mg via INTRAVENOUS
  Administered 2021-03-07: 30 mg via INTRAVENOUS

## 2021-03-07 MED ORDER — LIDOCAINE HCL (CARDIAC) PF 100 MG/5ML IV SOSY
PREFILLED_SYRINGE | INTRAVENOUS | Status: DC | PRN
  Administered 2021-03-07: 50 mg via INTRAVENOUS

## 2021-03-07 MED ORDER — PHENYLEPHRINE 40 MCG/ML (10ML) SYRINGE FOR IV PUSH (FOR BLOOD PRESSURE SUPPORT)
PREFILLED_SYRINGE | INTRAVENOUS | Status: AC
  Filled 2021-03-07: qty 10

## 2021-03-07 NOTE — Anesthesia Procedure Notes (Signed)
Date/Time: 03/07/2021 7:41 AM Performed by: Orlie Dakin, CRNA Pre-anesthesia Checklist: Patient identified, Emergency Drugs available, Suction available and Patient being monitored Patient Re-evaluated:Patient Re-evaluated prior to induction Oxygen Delivery Method: Nasal cannula Induction Type: IV induction Placement Confirmation: positive ETCO2

## 2021-03-07 NOTE — Op Note (Signed)
[facility] Patient [name] Procedure Date: 03/07/2021 7:20 AM MRN: [id number] Date of Birth: [birth year] Attending MD: Norvel Richards , MD CSN: [id number] Age: 65 Admit Type: Outpatient Procedure:                Colonoscopy Indications:              High risk colon cancer surveillance: Personal                            history of colonic polyps Providers:                Norvel Richards, MD, Otis Peak B. Sharon Seller, RN,                            Nelma Rothman, Technician Referring MD:              Medicines:                Propofol per Anesthesia Complications:            No immediate complications. Estimated Blood Loss:     Estimated blood loss was minimal. Procedure:                Pre-Anesthesia Assessment:                           - Prior to the procedure, a History and Physical                            was performed, and patient medications and                            allergies were reviewed. The patient's tolerance of                            previous anesthesia was also reviewed. The risks                            and benefits of the procedure and the sedation                            options and risks were discussed with the patient.                            All questions were answered, and informed consent                            was obtained. Prior Anticoagulants: The patient has                            taken no previous anticoagulant or antiplatelet                            agents. ASA Grade Assessment: III - A patient with  severe systemic disease. After reviewing the risks                            and benefits, the patient was deemed in                            satisfactory condition to undergo the procedure.                           After obtaining informed consent, the colonoscope                            was passed under direct vision. Throughout the                            procedure, the  patient's blood pressure, pulse, and                            oxygen saturations were monitored continuously. The                            CF-[id number] ([id number]) scope was introduced through                            the anus and advanced to the the cecum, identified                            by appendiceal orifice and ileocecal valve. The                            colonoscopy was performed without difficulty. The                            patient tolerated the procedure well. The quality                            of the bowel preparation was adequate. Scope In: 7:40:04 AM Scope Out: 7:55:33 AM Scope Withdrawal Time: 0 hours 8 minutes 4 seconds  Total Procedure Duration: 0 hours 15 minutes 29 seconds  Findings:      The perianal and digital rectal examinations were normal.      Non-bleeding internal hemorrhoids were found during retroflexion. The       hemorrhoids were medium-sized and Grade I (internal hemorrhoids that do       not prolapse).      Two semi-pedunculated polyps were found in the splenic flexure. The       polyps were 4 to 6 mm in size. These polyps were removed with a cold       snare. Resection and retrieval were complete. Estimated blood loss was       minimal.      Scattered small-mouthed diverticula were found in the entire colon.      The exam was otherwise without abnormality. Impression:               - Non-bleeding internal hemorrhoids.                           -  Two 4 to 6 mm polyps at the splenic flexure,                            removed with a cold snare. Resected and retrieved.                           - Diverticulosis in the entire examined colon.                           - The examination was otherwise normal. Moderate Sedation:      Moderate (conscious) sedation was personally administered by an       anesthesia professional. The following parameters were monitored: oxygen       saturation, heart rate, blood pressure, respiratory rate, EKG,  adequacy       of pulmonary ventilation, and response to care. Recommendation:           - Patient has a contact number available for                            emergencies. The signs and symptoms of potential                            delayed complications were discussed with the                            patient. Return to normal activities tomorrow.                            Written discharge instructions were provided to the                            patient.                           - Resume previous diet.                           - Continue present medications.                           - Repeat colonoscopy date to be determined after                            pending pathology results are reviewed for                            surveillance based on pathology results.                           - Return to GI office (date not yet determined). Procedure Code(s):        --- Professional ---                           [phone], Colonoscopy, flexible; with removal of  tumor(s), polyp(s), or other lesion(s) by snare                            technique Diagnosis Code(s):        --- Professional ---                           Z86.010, Personal history of colonic polyps                           K63.5, Polyp of colon                           K64.0, First degree hemorrhoids                           K57.30, Diverticulosis of large intestine without                            perforation or abscess without bleeding CPT copyright 2019 American Medical Association. All rights reserved. The codes documented in this report are preliminary and upon coder review may  be revised to meet current compliance requirements. Cristopher Estimable. [name], MD Norvel Richards, MD 03/07/2021 8:57:32 AM This report has been signed electronically. Number of Addenda: 0

## 2021-03-07 NOTE — Discharge Instructions (Addendum)
  Colonoscopy Discharge Instructions  Read the instructions outlined below and refer to this sheet in the next few weeks. These discharge instructions provide you with general information on caring for yourself after you leave the hospital. Your doctor may also give you specific instructions. While your treatment has been planned according to the most current medical practices available, unavoidable complications occasionally occur. If you have any problems or questions after discharge, call Dr. Gala Romney at [phone]. ACTIVITY You may resume your regular activity, but move at a slower pace for the next 24 hours.  Take frequent rest periods for the next 24 hours.  Walking will help get rid of the air and reduce the bloated feeling in your belly (abdomen).  No driving for 24 hours (because of the medicine (anesthesia) used during the test).   Do not sign any important legal documents or operate any machinery for 24 hours (because of the anesthesia used during the test).  NUTRITION Drink plenty of fluids.  You may resume your normal diet as instructed by your doctor.  Begin with a light meal and progress to your normal diet. Heavy or fried foods are harder to digest and may make you feel sick to your stomach (nauseated).  Avoid alcoholic beverages for 24 hours or as instructed.  MEDICATIONS You may resume your normal medications unless your doctor tells you otherwise.  WHAT YOU CAN EXPECT TODAY Some feelings of bloating in the abdomen.  Passage of more gas than usual.  Spotting of blood in your stool or on the toilet paper.  IF YOU HAD POLYPS REMOVED DURING THE COLONOSCOPY: No aspirin products for 7 days or as instructed.  No alcohol for 7 days or as instructed.  Eat a soft diet for the next 24 hours.  FINDING OUT THE RESULTS OF YOUR TEST Not all test results are available during your visit. If your test results are not back during the visit, make an appointment with your caregiver to find out the  results. Do not assume everything is normal if you have not heard from your caregiver or the medical facility. It is important for you to follow up on all of your test results.  SEEK IMMEDIATE MEDICAL ATTENTION IF: You have more than a spotting of blood in your stool.  Your belly is swollen (abdominal distention).  You are nauseated or vomiting.  You have a temperature over 101.  You have abdominal pain or discomfort that is severe or gets worse throughout the day.    2 polyps removed from your colon today  Polyp and diverticulosis information provided  Further recommendations to follow pending review of pathology report  Patient request, I called Ogle Hoeffner at [phone] and reviewed results

## 2021-03-07 NOTE — Anesthesia Postprocedure Evaluation (Signed)
Anesthesia Post Note  Patient: [name]  Procedure(s) Performed: COLONOSCOPY WITH PROPOFOL POLYPECTOMY  Patient location during evaluation: Phase II Anesthesia Type: General Level of consciousness: awake Pain management: pain level controlled Vital Signs Assessment: post-procedure vital signs reviewed and stable Respiratory status: spontaneous breathing and respiratory function stable Cardiovascular status: blood pressure returned to baseline and stable Postop Assessment: no headache and no apparent nausea or vomiting Anesthetic complications: no Comments: Late entry   No notable events documented.   Last Vitals:  Vitals:   03/07/21 0646 03/07/21 0801  BP: 106/82 90/61  Pulse: 76 83  Resp: 12 18  Temp: 36.7 C 37 C  SpO2: 96% 98%    Last Pain:  Vitals:   03/07/21 0801  TempSrc: Oral  PainSc: 0-No pain                 Louann Sjogren

## 2021-03-07 NOTE — Transfer of Care (Signed)
Immediate Anesthesia Transfer of Care Note  Patient: [name]  Procedure(s) Performed: COLONOSCOPY WITH PROPOFOL POLYPECTOMY  Patient Location: Short Stay  Anesthesia Type:General  Level of Consciousness: awake, alert  and oriented  Airway & Oxygen Therapy: Patient Spontanous Breathing  Post-op Assessment: Report given to RN and Post -op Vital signs reviewed and stable  Post vital signs: Reviewed and stable  Last Vitals:  Vitals Value Taken Time  BP 90/61 03/07/21 0801  Temp 37 C 03/07/21 0801  Pulse 83 03/07/21 0801  Resp 18 03/07/21 0801  SpO2 98 % 03/07/21 0801    Last Pain:  Vitals:   03/07/21 0801  TempSrc: Oral  PainSc: 0-No pain      Patients Stated Pain Goal: 5 (20/80/22 3361)  Complications: No notable events documented.

## 2021-03-07 NOTE — H&P (Signed)
@LOGO @   Primary Care Physician:  Redmond School, MD Primary Gastroenterologist:  Dr. Gala Romney  Pre-Procedure History & Physical: HPI:  [name] is a 65 y.o. male here for surveillance colonoscopy.  History multiple colonic adenomas removed previously.  Past Medical History:  Diagnosis Date   Anemia    Anxiety    Arthritis    Bladder stone    Cancer ([place]) 2007   prostate CA   COPD (chronic obstructive pulmonary disease) (HCC)    Decreased pulse 08/16/2008   LE doppler - bilateral ABIs normal, bilateral PVRs normal waveform; bilateral common iliac arteries difficult to visualize however velocities suggest less than 50% diameter reduction; bilateral LE normal velocities w/o evidence of diameter reduction   Depression    GERD (gastroesophageal reflux disease)    Hypercholesteremia    Hypertension    Mental disorder    PTSD   Pre-diabetes    Shortness of breath    Sleep apnea    wears CPAP nightly, settings at 14    Tobacco abuse    Urinary incontinence    due to prostate removal    Past Surgical History:  Procedure Laterality Date   ANTERIOR LAT LUMBAR FUSION N/A 11/17/2012   Procedure: XLIF L3 - L4 POSTERIOR L3 -L4 FUSION INSTRUMENTATION/L2 - L3 DECOMPRESSION (REVISION OF POSTERIOR HARDWARE) 1 LEVEL;  Surgeon: Melina Schools, MD;  Location: [facility];  Service: Orthopedics;  Laterality: N/A;  procedure #1: start 0923, end 1057.    BACK SURGERY  2010 / 2014   BLADDER SUSPENSION     x 2   CARDIAC CATHETERIZATION     no PCI   CARDIOVASCULAR STRESS TEST  08/16/2008   EF 57%; inferior region has diaphragmatic attenuation, otherwise normal perfusion w/o evidence of ischemia or infarct; LV normal in size; no scintographic evidence of inducible ischemia; no ECG changes, negative for ischemia; low risk scan   CERVICAL FUSION     COLON RESECTION N/A 07/12/2013   peritoneal biopsy only, evidence of metastatic prostate cancer   COLONOSCOPY     COLONOSCOPY N/A 05/27/2013   RMR:  Extrinsic mass effect at the level of the appendiceal orifice likely represents an appendiceal or periappendiceal process. Neovascular changes of the rectal mucosa consistent with prior history of radiation treatment. Multiple 3-5 mm polyps removed, several tubular adenomas. Diverticulosis.nex tcs 2019   COLONOSCOPY WITH PROPOFOL N/A 07/05/2018   Procedure: COLONOSCOPY WITH PROPOFOL;  Surgeon: Daneil Dolin, MD;  [facility];  Service: Endoscopy;  Laterality: N/A;  9:30am   CYSTOSCOPY W/ URETERAL STENT PLACEMENT N/A 10/03/2013   Procedure: CYSTOSCOPY AND REMOVAL OF BLADDER NECK STONE ;  Surgeon: Dutch Gray, MD;  Location: [facility];  Service: Urology;  Laterality: N/A;  WITH REMOVAL OF BLADDER NECK STONE     ESOPHAGOGASTRODUODENOSCOPY (EGD) WITH ESOPHAGEAL DILATION N/A 05/27/2013   RMR: Mild erosive reflux esophagitis with soft noncritical appearing stricture status post Maloney dilation, antral erosions with reactive gastropathy but no H. pylori   ESOPHAGOGASTRODUODENOSCOPY (EGD) WITH PROPOFOL N/A 07/05/2018   Procedure: ESOPHAGOGASTRODUODENOSCOPY (EGD) WITH PROPOFOL;  Surgeon: Daneil Dolin, MD;  [facility];  Service: Endoscopy;  Laterality: N/A;   HOLMIUM LASER APPLICATION N/A 03/29/6313   Procedure: HOLMIUM LASER APPLICATION;  Surgeon: Dutch Gray, MD;  Location: [facility];  Service: Urology;  Laterality: N/A;   MALONEY DILATION N/A 07/05/2018   Procedure: Venia Minks DILATION;  Surgeon: Daneil Dolin, MD;  [facility];  Service: Endoscopy;  Laterality: N/A;  POLYPECTOMY  07/05/2018   Procedure: POLYPECTOMY;  Surgeon: Daneil Dolin, MD;  [facility];  Service: Endoscopy;;  cecal polyp , splenic flexure polyps x4   PROSTATE SURGERY     TRANSTHORACIC ECHOCARDIOGRAM  08/16/2008   essentially normal for age   URINARY SPHINCTER IMPLANT N/A 01/31/2014   Procedure: IMPLANTATION ARTIFICIAL SPHINCTER  WITH CYSTOSCOPY;  Surgeon: Reece Packer, MD;  Location:  [facility];  Service: Urology;  Laterality: N/A;   WRIST SURGERY Right     Prior to Admission medications   Medication Sig Start Date End Date Taking? Authorizing Provider  ARIPiprazole (ABILIFY) 10 MG tablet Take 10 mg by mouth at bedtime. 04/13/18  Yes [provider]  Choline Fenofibrate 135 MG capsule Take 135 mg by mouth at bedtime. trilipix   Yes [provider]  Coenzyme Q10 (CO Q 10) 100 MG CAPS Take 100 mg by mouth daily.    Yes [provider]  desipramine (NOPRAMIN) 100 MG tablet Take 100 mg by mouth at bedtime.  09/04/14  Yes [provider]  enalapril (VASOTEC) 10 MG tablet Take 10 mg by mouth every morning.    Yes [provider]  enzalutamide (XTANDI) 40 MG capsule Take 160 mg by mouth daily. Takes 4 capsules daily   Yes [provider]  ferrous sulfate 325 (65 FE) MG tablet Take 325 mg by mouth daily.  06/30/14  Yes [provider]  Javier Docker Oil 300 MG CAPS Take 300 mg by mouth daily.   Yes [provider]  meloxicam (MOBIC) 7.5 MG tablet Take 7.5 mg by mouth 2 (two) times daily. 04/18/19  Yes [provider]  Multiple Vitamin (MULTIVITAMIN WITH MINERALS) TABS tablet Take 1 tablet by mouth daily.   Yes [provider]  pantoprazole (PROTONIX) 40 MG tablet Take 1 tablet (40 mg total) by mouth daily. 30 minutes before breakfast 05/21/18  Yes Annitta Needs, NP  polyethylene glycol-electrolytes (NULYTELY) 420 g solution As directed 01/28/21  Yes Admiral Marcucci, Cristopher Estimable, MD  rosuvastatin (CRESTOR) 40 MG tablet Take 1 tablet (40 mg total) by mouth daily. 02/27/21  Yes Croitoru, Mihai, MD  venlafaxine XR (EFFEXOR-XR) 75 MG 24 hr capsule Take 75 mg by mouth daily.  10/03/14  Yes [provider]    Allergies as of 01/28/2021 - Review Complete 12/18/2020  Allergen Reaction Noted   Penicillins Anaphylaxis 09/30/2011    Family History  Problem Relation Age of Onset   Diabetes Mother    Hypertension Mother     Heart disease Mother    Diabetes Father    Hypertension Father    Colon polyps Father        age 67   Hypertension Brother    Diabetes Brother    Colon cancer Neg Hx     Social History   Socioeconomic History   Marital status: Married    Spouse name: Not on file   Number of children: Not on file   Years of education: Not on file   Highest education level: Not on file  Occupational History   Occupation: driving truck    Comment: retired  Tobacco Use   Smoking status: Former    Packs/day: 0.25    Years: 43.00    Pack years: 10.75    Types: Cigarettes    Quit date: 02/28/2013    Years since quitting: 8.0   Smokeless tobacco: Former    Quit date: 06/30/1980  Vaping Use   Vaping Use:  Never used  Substance and Sexual Activity   Alcohol use: No   Drug use: No   Sexual activity: Yes    Birth control/protection: None  Other Topics Concern   Not on file  Social History Narrative   Not on file   Social Determinants of Health   Financial Resource Strain: Not on file  Food Insecurity: Not on file  Transportation Needs: Not on file  Physical Activity: Not on file  Stress: Not on file  Social Connections: Not on file  Intimate Partner Violence: Not on file    Review of Systems: See HPI, otherwise negative ROS  Physical Exam: BP 106/82   Pulse 76   Temp 98 F (36.7 C) (Oral)   Resp 12   SpO2 96%  General:   Alert,  Well-developed, well-nourished, pleasant and cooperative in NAD SNeck:  Supple; no masses or thyromegaly. No significant cervical adenopathy. Lungs:  Clear throughout to auscultation.   No wheezes, crackles, or rhonchi. No acute distress. Heart:  Regular rate and rhythm; no murmurs, clicks, rubs,  or gallops. Abdomen: Non-distended, normal bowel sounds.  Soft and nontender without appreciable mass or hepatosplenomegaly.  Pulses:  Normal pulses noted. Extremities:  Without clubbing or edema.  Impression/Plan: 65 year old gentleman here for surveillance  colonoscopy.  History multiple colonic adenomas removed previously.  I have offered the patient a surveillance colonoscopy.  The risks, benefits, limitations, alternatives and imponderables have been reviewed with the patient. Questions have been answered. All parties are agreeable.      Notice: This dictation was prepared with Dragon dictation along with smaller phrase technology. Any transcriptional errors that result from this process are unintentional and may not be corrected upon review.

## 2021-03-07 NOTE — Anesthesia Preprocedure Evaluation (Signed)
Anesthesia Evaluation  Patient identified by MRN, date of birth, ID band Patient awake    Reviewed: Allergy & Precautions, H&P , NPO status , Patient's Chart, lab work & pertinent test results, reviewed documented beta blocker date and time   Airway Mallampati: II  TM Distance: >3 FB Neck ROM: full    Dental no notable dental hx.    Pulmonary shortness of breath, sleep apnea and Continuous Positive Airway Pressure Ventilation , COPD, former smoker,    Pulmonary exam normal breath sounds clear to auscultation       Cardiovascular Exercise Tolerance: Good hypertension, negative cardio ROS   Rhythm:regular Rate:Normal     Neuro/Psych PSYCHIATRIC DISORDERS Anxiety Depression negative neurological ROS     GI/Hepatic Neg liver ROS, GERD  Medicated,  Endo/Other  diabetes, Type 2  Renal/GU negative Renal ROS  negative genitourinary   Musculoskeletal   Abdominal   Peds  Hematology  (+) Blood dyscrasia, anemia ,   Anesthesia Other Findings   Reproductive/Obstetrics negative OB ROS                             Anesthesia Physical Anesthesia Plan  ASA: 3  Anesthesia Plan: General   Post-op Pain Management:    Induction:   PONV Risk Score and Plan: Propofol infusion  Airway Management Planned:   Additional Equipment:   Intra-op Plan:   Post-operative Plan:   Informed Consent: I have reviewed the patients History and Physical, chart, labs and discussed the procedure including the risks, benefits and alternatives for the proposed anesthesia with the patient or authorized representative who has indicated his/her understanding and acceptance.     Dental Advisory Given  Plan Discussed with: CRNA  Anesthesia Plan Comments:         Anesthesia Quick Evaluation

## 2021-03-08 ENCOUNTER — Encounter: Payer: Self-pay | Admitting: Internal Medicine

## 2021-03-08 LAB — SURGICAL PATHOLOGY

## 2021-03-11 ENCOUNTER — Other Ambulatory Visit (HOSPITAL_COMMUNITY): Payer: Medicare Other

## 2021-03-12 ENCOUNTER — Encounter (HOSPITAL_COMMUNITY): Payer: Self-pay | Admitting: Internal Medicine

## 2021-03-15 ENCOUNTER — Ambulatory Visit (HOSPITAL_COMMUNITY)
Admission: RE | Admit: 2021-03-15 | Discharge: 2021-03-15 | Disposition: A | Discharge status: Home / Self Care | Payer: Medicare Other | Source: Ambulatory Visit | Attending: Cardiovascular Disease | Admitting: Cardiovascular Disease

## 2021-03-15 ENCOUNTER — Other Ambulatory Visit: Payer: Self-pay

## 2021-03-15 DIAGNOSIS — R0602 Shortness of breath: Secondary | ICD-10-CM

## 2021-03-15 LAB — ECHOCARDIOGRAM COMPLETE
Area-P 1/2: 2.99 cm2
S' Lateral: 2.6 cm

## 2021-03-15 NOTE — Progress Notes (Signed)
*  PRELIMINARY RESULTS* Echocardiogram 2D Echocardiogram has been performed.  Samuel Germany 03/15/2021, 1:41 PM

## 2021-09-16 ENCOUNTER — Other Ambulatory Visit: Payer: Self-pay

## 2021-09-16 ENCOUNTER — Encounter: Payer: Self-pay | Admitting: Cardiovascular Disease

## 2021-09-16 ENCOUNTER — Ambulatory Visit (INDEPENDENT_AMBULATORY_CARE_PROVIDER_SITE_OTHER): Payer: Medicare Other | Admitting: Cardiovascular Disease

## 2021-09-16 VITALS — BP 112/70 | HR 70 | Ht 74.0 in | Wt 269.2 lb

## 2021-09-16 DIAGNOSIS — E782 Mixed hyperlipidemia: Secondary | ICD-10-CM | POA: Diagnosis not present

## 2021-09-16 DIAGNOSIS — F32A Depression, unspecified: Secondary | ICD-10-CM

## 2021-09-16 DIAGNOSIS — E669 Obesity, unspecified: Secondary | ICD-10-CM | POA: Diagnosis not present

## 2021-09-16 DIAGNOSIS — J449 Chronic obstructive pulmonary disease, unspecified: Secondary | ICD-10-CM

## 2021-09-16 DIAGNOSIS — G4733 Obstructive sleep apnea (adult) (pediatric): Secondary | ICD-10-CM | POA: Diagnosis not present

## 2021-09-16 DIAGNOSIS — I10 Essential (primary) hypertension: Secondary | ICD-10-CM | POA: Diagnosis not present

## 2021-09-16 NOTE — Patient Instructions (Addendum)
Medication Instructions:   No changes     Lab Work: Not needed   Testing/Procedures: Not needed   Follow-Up: At [facility], you and your health needs are our priority.  As part of our continuing mission to provide you with exceptional heart care, we have created designated Provider Care Teams.  These Care Teams include your primary Cardiologist (physician) and Advanced Practice Providers (APPs -  Physician Assistants and Nurse Practitioners) who all work together to provide you with the care you need, when you need it.     Your next appointment:   12 month(s)- sleep clinic  The format for your next appointment:   In Person  Provider: Philomena Doheny    Other Instructions   No pressure changes were done to your C-PAP

## 2021-09-29 ENCOUNTER — Encounter: Payer: Self-pay | Admitting: Cardiovascular Disease

## 2021-09-29 NOTE — Progress Notes (Addendum)
Cardiology Office Note    Date:  09/29/2021   ID:  [name], DOB 29-Jan-1956, MRN [id number]  PCP:  Michael School, MD  Cardiologist:  Michael Majestic, MD (sleep); Dr. Candie [name]  9-monthfollow-up sleep evaluation   History of Present Illness:  Michael RICKELis a 66y.o. male who has a history of hypertension, hyperlipidemia, COPD, type 2 diabetes mellitus, obesity, as well as metastatic prostate CA on anti-antigen therapy.  He has a history of obstructive sleep apnea that dates back to 2012.  He had an old Respironics unit which is an auto unit which is set at 4 -20.  He brought his machine with him to the office today.  He has not been followed by a sleep physician only saw [name] after initiation of treatment.  In 2012.  Recently, he has not been sleeping well.  He is not getting adequate pressures.  The machine tends to cut off at times.  A download reveals a 30 day sleep average of 8 hours and 30 minutes.  Days greater than 4 hours were 29 out of 30.  AHI was 3.0.  He has noticed breakthrough snoring.  He wakes up at least 3 times per night for nocturia.  He is unaware of restless legs.  He denies bruxism.  An Epworth Sleepiness Scale score was calculated in the office today and this endorsed at 7 with a slight chance of dozing while sitting and reading, as a passenger in a car for an hour without a break, and while sitting quietly after lunch.  There is a moderate chance of dozing while watching television, and lying down to rest in the afternoon.   When I saw him for initial sleep evaluation in January 2019 his machine was beginning to malfunction and he was feeling that he was not getting adequate treatment.  Since it is been 7 to 8 years since his initial evaluation I recommended a repeat evaluation.  Apparently he had this done at [facility].  His diagnostic study on October 06, 2017 with mild sleep apnea overall with an AHI of 8.3, but his RDI was 14.9.  However, he  had severe sleep apnea with rem sleep with an AHI 51.4 and significant oxygen desaturation to 81%.  He also had frequent periodic limb movement disorder of sleep.  On that night, he did not meet split night criteria and returned in March for CPAP titration.  This was an inadequate titration.  Consequently he was set up with a CPAP auto unit with a minimum pressure of 6 a maximum of 20.  I saw him in May 2019 at which time he was 100% compliant since reinitiation December 14, 2017.  He is averaging 8 hours and 18 minutes of sleep per night.  AHI is excellent at 1.2.  There was only few days with mask leak.  His 95th percentile pressure was 13.5 with maximum average pressure 15.5.  Typically he goes to bed arouind 10 p.m. and wakes up at 8 am.  He notices significant improvement with this new machine.  It is much quieter.  He is sleeping well.  He does not have any breakthrough snoring. He denies any issues with painful restless legs despite being documented to have increased limb movement disorder of sleep.   I  evaluated him on 01/26/2019 in a telemedicine visit at which time he had continued to do well since his prior evaluation.  I was able to a  new download from Dec 27, 2018 through January 25, 2019.  He is 100% compliant.  Average sleep duration is 9 hours and 13 minutes.  CPAP is set at a pressure range of 10 to 20 cm of water.  AHI is excellent at 0.9.  There is no leak.  His 95th percentile pressure is 12.6 with a maximum average pressure at 14.4.  He has a ResMed F 20 fullface mask.  He admits to not being very active particularly during this COVID-19 pandemic.  He denies any chest pain PND orthopnea or palpitations.  He continues to be on therapy for prostate CA followed by Dr. Alinda [name].   I last evaluated him on February 02, 2020 in a telemedicine evaluation.  At that time, he continued to do well and had seen Dr. Recardo [name] for a yearly follow-up evaluation.  From a sleep perspective, he has felt well and has continued  to use CPAP.  I obtained a new download from May 9 through January 30, 2020.  This continues to show excellent compliance.  He has used his machine every night except to when he was in [place].  Average sleep duration is 10 hours and 44 minutes.  His CPAP auto unit is set at a pressure range of 10 to 20 cm.  95th percentile pressure is 13.9 with a maximum average pressure of 15.6 cm of water.  AHI is excellent at 1.4/h.  There is no leak.  He was using a ResMed AirFit F20 fullface mask.  He is unaware of any breakthrough snoring.  He denies residual daytime sleepiness.  [employer] is his DME company.  He continues to follow with Dr. Alinda [name] for his prostate CA.  His blood pressure has been stable on enalapril 10 mg daily.  He continues to take Effexor and Abilify for depression.  He is on pantoprazole for GERD.  He has not had significant success with weight loss.  He continues to be on rosuvastatin for hyperlipidemia   Since my last evaluation, he was seen by Dr. Sallyanne [name].  July 2022 and had experience of very mild shortness of breath with activity.  He underwent a follow-up echo Doppler study in March 15, 2021 which essentially was normal.  He was advised that he may need to see a pulmonary specialist to adjust inhalers.  Weight loss was recommended.  He admitted to continued 100% CPAP compliance.  Presently, he feels well.  I obtained a download of his CPAP from August 15, 2021 through September 13, 2021.  Compliance remains excellent with average use of 7 hours and 21 minutes per night.  His CPAP is set at a 10 to 20 cm pressure range and his 95th percentile is 12.5 with maximum average pressure 13.8 cm of water.  AHI is excellent at 1.0.  He is unaware of breakthrough snoring.  His sleep is restorative.  An Epworth scale was calculated in the office today and this endorsed at 6 arguing against residual daytime sleepiness.  He states his blood pressure has been stable and he has continued to be on enalapril 10 mg  daily.  He takes rosuvastatin for hyperlipidemia.  He has been taking desipramine 100 mg at bedtime for sleep.  He continues to be on Abilify for depression and Effexor.  He presents for evaluation.   Past Medical History:  Diagnosis Date   Anemia    Anxiety    Arthritis    Bladder stone    Cancer ([place]) 2007   prostate CA  COPD (chronic obstructive pulmonary disease) (HCC)    Decreased pulse 08/16/2008   LE doppler - bilateral ABIs normal, bilateral PVRs normal waveform; bilateral common iliac arteries difficult to visualize however velocities suggest less than 50% diameter reduction; bilateral LE normal velocities w/o evidence of diameter reduction   Depression    GERD (gastroesophageal reflux disease)    Hypercholesteremia    Hypertension    Mental disorder    PTSD   Pre-diabetes    Shortness of breath    Sleep apnea    wears CPAP nightly, settings at 14    Tobacco abuse    Urinary incontinence    due to prostate removal    Past Surgical History:  Procedure Laterality Date   ANTERIOR LAT LUMBAR FUSION N/A 11/17/2012   Procedure: XLIF L3 - L4 POSTERIOR L3 -L4 FUSION INSTRUMENTATION/L2 - L3 DECOMPRESSION (REVISION OF POSTERIOR HARDWARE) 1 LEVEL;  Surgeon: Melina Schools, MD;  Location: [facility];  Service: Orthopedics;  Laterality: N/A;  procedure #1: start 0923, end 1057.    BACK SURGERY  2010 / 2014   BLADDER SUSPENSION     x 2   CARDIAC CATHETERIZATION     no PCI   CARDIOVASCULAR STRESS TEST  08/16/2008   EF 57%; inferior region has diaphragmatic attenuation, otherwise normal perfusion w/o evidence of ischemia or infarct; LV normal in size; no scintographic evidence of inducible ischemia; no ECG changes, negative for ischemia; low risk scan   CERVICAL FUSION     COLON RESECTION N/A 07/12/2013   peritoneal biopsy only, evidence of metastatic prostate cancer   COLONOSCOPY     COLONOSCOPY N/A 05/27/2013   RMR: Extrinsic mass effect at the level of the appendiceal orifice  likely represents an appendiceal or periappendiceal process. Neovascular changes of the rectal mucosa consistent with prior history of radiation treatment. Multiple 3-5 mm polyps removed, several tubular adenomas. Diverticulosis.nex tcs 2019   COLONOSCOPY WITH PROPOFOL N/A 07/05/2018   Procedure: COLONOSCOPY WITH PROPOFOL;  Surgeon: Daneil Dolin, MD;  [facility];  Service: Endoscopy;  Laterality: N/A;  9:30am   COLONOSCOPY WITH PROPOFOL N/A 03/07/2021   Procedure: COLONOSCOPY WITH PROPOFOL;  Surgeon: Daneil Dolin, MD;  [facility];  Service: Endoscopy;  Laterality: N/A;  2:30pm   CYSTOSCOPY W/ URETERAL STENT PLACEMENT N/A 10/03/2013   Procedure: CYSTOSCOPY AND REMOVAL OF BLADDER NECK STONE ;  Surgeon: Dutch Gray, MD;  Location: [facility];  Service: Urology;  Laterality: N/A;  WITH REMOVAL OF BLADDER NECK STONE     ESOPHAGOGASTRODUODENOSCOPY (EGD) WITH ESOPHAGEAL DILATION N/A 05/27/2013   RMR: Mild erosive reflux esophagitis with soft noncritical appearing stricture status post Maloney dilation, antral erosions with reactive gastropathy but no H. pylori   ESOPHAGOGASTRODUODENOSCOPY (EGD) WITH PROPOFOL N/A 07/05/2018   Procedure: ESOPHAGOGASTRODUODENOSCOPY (EGD) WITH PROPOFOL;  Surgeon: Daneil Dolin, MD;  [facility];  Service: Endoscopy;  Laterality: N/A;   HOLMIUM LASER APPLICATION N/A 10/30/1769   Procedure: HOLMIUM LASER APPLICATION;  Surgeon: Dutch Gray, MD;  Location[facility];  Service: Urology;  Laterality: N/A;   MALONEY DILATION N/A 07/05/2018   Procedure: Venia Minks DILATION;  Surgeon: Daneil Dolin, MD;  [facility];  Service: Endoscopy;  Laterality: N/A;   POLYPECTOMY  07/05/2018   Procedure: POLYPECTOMY;  Surgeon: Daneil Dolin, MD;  [facility];  Service: Endoscopy;;  cecal polyp , splenic flexure polyps x4   POLYPECTOMY  03/07/2021   Procedure: POLYPECTOMY;  Surgeon: Manus Rudd  M, MD;  [facility];  Service:  Endoscopy;;   PROSTATE SURGERY     TRANSTHORACIC ECHOCARDIOGRAM  08/16/2008   essentially normal for age   URINARY SPHINCTER IMPLANT N/A 01/31/2014   Procedure: IMPLANTATION ARTIFICIAL SPHINCTER  WITH CYSTOSCOPY;  Surgeon: Reece Packer, MD;  Location[facility];  Service: Urology;  Laterality: N/A;   WRIST SURGERY Right     Current Medications: Outpatient Medications Prior to Visit  Medication Sig Dispense Refill   ARIPiprazole (ABILIFY) 10 MG tablet Take 10 mg by mouth at bedtime.     Choline Fenofibrate 135 MG capsule Take 135 mg by mouth at bedtime. trilipix     Coenzyme Q10 (CO Q 10) 100 MG CAPS Take 100 mg by mouth daily.      desipramine (NOPRAMIN) 100 MG tablet Take 100 mg by mouth at bedtime.   0   enalapril (VASOTEC) 10 MG tablet Take 10 mg by mouth every morning.      enzalutamide (XTANDI) 40 MG capsule Take 160 mg by mouth daily. Takes 4 capsules daily     ferrous sulfate 325 (65 FE) MG tablet Take 325 mg by mouth daily.   0   Krill Oil 300 MG CAPS Take 300 mg by mouth daily.     meloxicam (MOBIC) 7.5 MG tablet Take 7.5 mg by mouth 2 (two) times daily.     Multiple Vitamin (MULTIVITAMIN WITH MINERALS) TABS tablet Take 1 tablet by mouth daily.     pantoprazole (PROTONIX) 40 MG tablet Take 1 tablet (40 mg total) by mouth daily. 30 minutes before breakfast 90 tablet 3   rosuvastatin (CRESTOR) 40 MG tablet Take 1 tablet (40 mg total) by mouth daily. 90 tablet 3   venlafaxine XR (EFFEXOR-XR) 75 MG 24 hr capsule Take 75 mg by mouth daily.   0   polyethylene glycol-electrolytes (NULYTELY) 420 g solution As directed (Patient not taking: Reported on 09/16/2021) 4000 mL 0   No facility-administered medications prior to visit.     Allergies:   Penicillins   Social History   Socioeconomic History   Marital status: Married    Spouse name: Not on file   Number of children: Not on file   Years of education: Not on file   Highest education level: Not on file  Occupational History    Occupation: driving truck    Comment: retired  Tobacco Use   Smoking status: Former    Packs/day: 0.25    Years: 43.00    Pack years: 10.75    Types: Cigarettes    Quit date: 02/28/2013    Years since quitting: 8.5   Smokeless tobacco: Former    Quit date: 06/30/1980  Vaping Use   Vaping Use: Never used  Substance and Sexual Activity   Alcohol use: No   Drug use: No   Sexual activity: Yes    Birth control/protection: None  Other Topics Concern   Not on file  Social History Narrative   Not on file   Social Determinants of Health   Financial Resource Strain: Not on file  Food Insecurity: Not on file  Transportation Needs: Not on file  Physical Activity: Not on file  Stress: Not on file  Social Connections: Not on file    Retired as a Administrator at age 38  Family History:  The patient's family history includes Colon polyps in his father; Diabetes in his brother, father, and mother; Heart disease in his mother; Hypertension in his brother,  father, and mother.   ROS General: Negative; No fevers, chills, or night sweats;  HEENT: Negative; No changes in vision or hearing, sinus congestion, difficulty swallowing Pulmonary: Occasional wheezing improved with inhaler Cardiovascular: Hypertension, no chest pain GI: GERD GU: Negative; No dysuria, hematuria, or difficulty voiding Musculoskeletal: Negative; no myalgias, joint pain, or weakness Hematologic/Oncology: Negative; no easy bruising, bleeding Endocrine: Negative; no heat/cold intolerance; no diabetes Neuro: Negative; no changes in balance, headaches Skin: Negative; No rashes or skin lesions Psychiatric: History of depression Sleep: Positive for OSA on CPAP; No snoring, daytime sleepiness, hypersomnolence, bruxism, restless legs, hypnogognic hallucinations, no cataplexy Other comprehensive 14 point system review is negative.   PHYSICAL EXAM:   VS:  BP 112/70    Pulse 70    Ht '6\' 2"'  (1.88 m)    Wt 269 lb 3.2 oz (122.1  kg)    SpO2 97%    BMI 34.56 kg/m     Repeat blood pressure by me was 110/70  Wt Readings from Last 3 Encounters:  09/16/21 269 lb 3.2 oz (122.1 kg)  02/28/21 277 lb (125.6 kg)  02/27/21 273 lb 12.8 oz (124.2 kg)    General: Alert, oriented, no distress.  Skin: normal turgor, no rashes, warm and dry HEENT: Normocephalic, atraumatic. Pupils equal round and reactive to light; sclera anicteric; extraocular muscles intact;  Nose without nasal septal hypertrophy Mouth/Parynx benign; Mallinpatti scale 4 Neck: No JVD, no carotid bruits; normal carotid upstroke Lungs: clear to ausculatation and percussion; no wheezing or rales Chest wall: without tenderness to palpitation Heart: PMI not displaced, RRR, s1 s2 normal, 1/6 systolic murmur, no diastolic murmur, no rubs, gallops, thrills, or heaves Abdomen: soft, nontender; no hepatosplenomehaly, BS+; abdominal aorta nontender and not dilated by palpation. Back: no CVA tenderness Pulses 2+ Musculoskeletal: full range of motion, normal strength, no joint deformities Extremities: no clubbing cyanosis or edema, Homan's sign negative  Neurologic: grossly nonfocal; Cranial nerves grossly wnl Psychologic: Normal mood and affect   Studies/Labs Reviewed:   I personally reviewed the ECG of February 27, 2021 which revealed normal sinus rhythm at 72 with normal intervals and no ectopy.  I personally reviewed the ECG of January 31, 2020 which showed normal sinus rhythm at 78 without ectopy.  No ST segment changes, QTc interval 450 ms.  Recent Labs: BMP Latest Ref Rng & Units 06/08/2019 01/24/2019 06/30/2018  Glucose 65 - 99 mg/dL 128(H) 121(H) 136(H)  BUN 8 - 27 mg/dL '22 21 19  ' Creatinine 0.76 - 1.27 mg/dL 0.95 1.02 0.90  BUN/Creat Ratio 10 - '24 23 21 ' -  Sodium 134 - 144 mmol/L 141 140 141  Potassium 3.5 - 5.2 mmol/L 4.3 4.7 4.4  Chloride 96 - 106 mmol/L 103 102 108  CO2 20 - 29 mmol/L '23 23 25  ' Calcium 8.6 - 10.2 mg/dL 9.9 10.5(H) 9.3     Hepatic  Function Latest Ref Rng & Units 06/08/2019 01/24/2019 07/06/2013  Total Protein 6.0 - 8.5 g/dL 7.3 7.4 7.3  Albumin 3.8 - 4.8 g/dL 4.6 5.1(H) 4.1  AST 0 - 40 IU/L 15 15 47(H)  ALT 0 - 44 IU/L '24 15 18  ' Alk Phosphatase 39 - 117 IU/L 54 51 62  Total Bilirubin 0.0 - 1.2 mg/dL <0.2 0.2 0.2(L)    CBC Latest Ref Rng & Units 02/28/2021 01/24/2019 06/30/2018  WBC 4.0 - 10.5 K/uL 4.5 7.1 6.7  Hemoglobin 13.0 - 17.0 g/dL 12.0(L) 13.5 11.7(L)  Hematocrit 39.0 - 52.0 % 37.4(L) 41.7 37.7(L)  Platelets  150 - 400 K/uL 307 420 281   Lab Results  Component Value Date   MCV 102.2 (H) 02/28/2021   MCV 99 (H) 01/24/2019   MCV 97.9 06/30/2018   No results found for: TSH No results found for: HGBA1C   BNP No results found for: BNP  ProBNP No results found for: PROBNP   Lipid Panel     Component Value Date/Time   CHOL 168 06/08/2019 0819   TRIG 207 (H) 06/08/2019 0819   HDL 35 (L) 06/08/2019 0819   CHOLHDL 4.8 06/08/2019 0819   LDLCALC 97 06/08/2019 0819   LABVLDL 36 06/08/2019 0819     RADIOLOGY: No results found.   Additional studies/ records that were reviewed today include:     March 10. 2019 CLINICAL INFORMATION The patient is referred for a CPAP titration to treat sleep apnea.   Date of NPSG: 10/06/2017: AHI 8.3/h; RDI 14.9/h; AHI during REM sleep 51.4/h; , oxygen desaturation to 81%.   SLEEP STUDY TECHNIQUE As per the AASM Manual for the Scoring of Sleep and Associated Events v2.3 (April 2016) with a hypopnea requiring 4% desaturations.   The channels recorded and monitored were frontal, central and occipital EEG, electrooculogram (EOG), submentalis EMG (chin), nasal and oral airflow, thoracic and abdominal wall motion, anterior tibialis EMG, snore microphone, electrocardiogram, and pulse oximetry. Continuous positive airway pressure (CPAP) was initiated at the beginning of the study and titrated to treat sleep-disordered breathing.   MEDICATIONS     Calcium  Carb-Cholecalciferol (CALCIUM 1000 + D PO)         Choline Fenofibrate (TRILIPIX) 135 MG capsule         Coenzyme Q10 (CO Q 10) 100 MG CAPS         desipramine (NOPRAMIN) 100 MG tablet         enalapril (VASOTEC) 10 MG tablet         ferrous sulfate 325 (65 FE) MG tablet         Krill Oil 300 MG CAPS         leuprolide (LUPRON) 30 MG injection         megestrol (MEGACE) 20 MG tablet         meloxicam (MOBIC) 7.5 MG tablet         Multiple Vitamin (MULTIVITAMIN WITH MINERALS) TABS tablet         pantoprazole (PROTONIX) 40 MG tablet         simvastatin (ZOCOR) 40 MG tablet         venlafaxine XR (EFFEXOR-XR) 75 MG 24 hr capsule       Medications self-administered by patient taken the night of the study : N/A   TECHNICIAN COMMENTS Comments added by technician: CPAP therapy started at 5 cm of H20 and increased to 7 cm of H20 in lateral positions. Patient unable to obtain sleep in supine position. PLMs were noted throughout testing. Patient tolerated CPAP very well. Suboptimal pressure obtained due to supine REM stage not observed. Oral breathing noticed throughout therapy   RESPIRATORY PARAMETERS Optimal PAP Pressure (cm):   AHI at Optimal Pressure (/hr):            N/A Overall Minimal O2 (%):         92.0     Supine % at Optimal Pressure (%):    N/A Minimal O2 at Optimal Pressure (%): 92.0        SLEEP ARCHITECTURE The study was initiated at 9:52:40 PM and ended  at 5:51:32 AM.   Sleep onset time was 61.7 minutes and the sleep efficiency was 60.6%%. The total sleep time was 290.1 minutes.   The patient spent 8.4%% of the night in stage N1 sleep, 58.8%% in stage N2 sleep, 24.7%% in stage N3 and 8.10% in REM.Stage REM latency was 219.0 minutes   Wake after sleep onset was 127.0. Alpha intrusion was absent. Supine sleep was 0.00%.   CARDIAC DATA The 2 lead EKG demonstrated sinus rhythm. The mean heart rate was N/A beats per minute. Other EKG findings include: None.   LEG MOVEMENT  DATA The total Periodic Limb Movements of Sleep (PLMS) were 0. The PLMS index was 0.0. A PLMS index of <15 is considered normal in adults.   IMPRESSIONS - An optimal PAP pressure could not be selected for this patient based on the available study data. - Central sleep apnea was not noted during this titration (CAI = 0.0/h). - Significant oxygen desaturations were not observed during this titration (min O2 = 92.0%). - The patient snored with loud snoring volume during this titration study. - No cardiac abnormalities were observed during this study. - Clinically significant periodic limb movements were not noted during this study. Arousals associated with PLMs were rare.   DIAGNOSIS - Obstructive Sleep Apnea (327.23 [G47.33 ICD-10])   RECOMMENDATIONS - Since the patient had previously documented severe sleep apnea during REM sleep whichwas not achieved on the present titration, recommend an initial trial of CPAP Auto with C-Flex/EPR of 3 at 6- 20 cm H2O with heated humidification and mask of choice. - Efforts should be made to maximize nasal and oral pharyngeal patency. - Avoid alcohol, sedatives and other CNS depressants that may worsen sleep apnea and disrupt normal sleep architecture. - Sleep hygiene should be reviewed to assess factors that may improve sleep quality. - Weight management and regular exercise should be initiated or continued. - Recommend adownload be obtained in 30 days and the patient return to sleep clinic for re-evaluation after 4 weeks of therapy.        ASSESSMENT:    1. OSA (obstructive sleep apnea)   2. Essential hypertension   3. Mild obesity   4. Mixed hyperlipidemia   5. COPD (chronic obstructive pulmonary disease) with chronic bronchitis ([place])   6. Depression, unspecified depression type      PLAN:  [name] is a 66 year old gentleman who has a history of hypertension, hyperlipidemia, COPD, diabetes mellitus, obesity, metastatic prostate CA,  as well as obstructive sleep apnea.  His diagnosis of OSA dates back to 2012.  Initially he had had an old Respironics CPAP machine and on December 14, 2017 he received a ResMed air sense 10 AutoSet unit.  His current machine is set at a pressure range of 10 to 20 cm.  He continues to have excellent compliance with average use of 7 hours and 21 minutes.  AHI is excellent at 1.0.  Most recently he has been using a fullface mask [employer].  He typically goes to bed between 1030 and 11 PM and wakes up around 7 AM.  [employer] is his DME company.  He continues to be compliant standards.  At times he admits to rare breakthrough snoring.  He has nocturia usually 1 to at most 2 times per night.  His blood pressure today is well controlled on enalapril 10 mg daily.  He is on rosuvastatin for hyperlipidemia.  He has a history of depression and is on Effexor in addition to Abilify.  He also has been taking desipramine 100 mg at bedtime.  He is followed closely by Dr. Sallyanne [name].  BMI is consistent with obesity with 34.56.  Weight loss and increased exercise was recommended.  I reviewed his echo Doppler data which showed normal LV function with EF 60 to 65% with mild LVH and normal diastolic parameters.  He will continue present therapy.  I will see him in 1 year for follow-up evaluation or sooner as needed.   Medication Adjustments/Labs and Tests Ordered: Current medicines are reviewed at length with the patient today.  Concerns regarding medicines are outlined above.  Medication changes, Labs and Tests ordered today are listed in the Patient Instructions below. Patient Instructions  Medication Instructions:   No changes     Lab Work: Not needed   Testing/Procedures: Not needed   Follow-Up: At [facility], you and your health needs are our priority.  As part of our continuing mission to provide you with exceptional heart care, we have created designated Provider Care Teams.  These Care Teams include your primary  Cardiologist (physician) and Advanced Practice Providers (APPs -  Physician Assistants and Nurse Practitioners) who all work together to provide you with the care you need, when you need it.     Your next appointment:   12 month(s)- sleep clinic  The format for your next appointment:   In Person  Provider: Philomena Doheny    Other Instructions   No pressure changes were done to your C-PAP    Signed, Michael Majestic, MD  09/29/2021 10:17 AM    [facility] [address], [place], [place], [place]  [address] Phone: [phone]

## 2022-02-04 ENCOUNTER — Other Ambulatory Visit: Payer: Self-pay | Admitting: Cardiovascular Disease

## 2022-06-19 ENCOUNTER — Other Ambulatory Visit (HOSPITAL_COMMUNITY): Payer: Self-pay | Admitting: Urology

## 2022-06-19 DIAGNOSIS — C61 Malignant neoplasm of prostate: Secondary | ICD-10-CM

## 2022-06-27 ENCOUNTER — Ambulatory Visit (HOSPITAL_COMMUNITY)
Admission: RE | Admit: 2022-06-27 | Discharge: 2022-06-27 | Disposition: A | Discharge status: Home / Self Care | Payer: Medicare Other | Source: Ambulatory Visit | Attending: Urology | Admitting: Urology

## 2022-06-27 DIAGNOSIS — C61 Malignant neoplasm of prostate: Secondary | ICD-10-CM | POA: Diagnosis present

## 2022-06-27 MED ORDER — PIFLIFOLASTAT F 18 (PYLARIFY) INJECTION
9.0000 | Freq: Once | INTRAVENOUS | Status: AC
  Administered 2022-06-27: 9 via INTRAVENOUS

## 2022-07-30 ENCOUNTER — Ambulatory Visit: Payer: Medicare Other | Attending: Cardiovascular Disease | Admitting: Cardiovascular Disease

## 2022-07-30 ENCOUNTER — Encounter: Payer: Self-pay | Admitting: Cardiovascular Disease

## 2022-07-30 VITALS — BP 110/70 | HR 76 | Ht 74.0 in | Wt 265.6 lb

## 2022-07-30 DIAGNOSIS — M25541 Pain in joints of right hand: Secondary | ICD-10-CM | POA: Diagnosis present

## 2022-07-30 DIAGNOSIS — M25542 Pain in joints of left hand: Secondary | ICD-10-CM | POA: Insufficient documentation

## 2022-07-30 DIAGNOSIS — R0602 Shortness of breath: Secondary | ICD-10-CM | POA: Insufficient documentation

## 2022-07-30 DIAGNOSIS — G4733 Obstructive sleep apnea (adult) (pediatric): Secondary | ICD-10-CM | POA: Insufficient documentation

## 2022-07-30 DIAGNOSIS — E669 Obesity, unspecified: Secondary | ICD-10-CM | POA: Diagnosis present

## 2022-07-30 DIAGNOSIS — I10 Essential (primary) hypertension: Secondary | ICD-10-CM | POA: Diagnosis present

## 2022-07-30 DIAGNOSIS — E782 Mixed hyperlipidemia: Secondary | ICD-10-CM | POA: Diagnosis present

## 2022-07-30 DIAGNOSIS — E1169 Type 2 diabetes mellitus with other specified complication: Secondary | ICD-10-CM | POA: Diagnosis present

## 2022-07-30 DIAGNOSIS — J4489 Other specified chronic obstructive pulmonary disease: Secondary | ICD-10-CM | POA: Insufficient documentation

## 2022-07-30 NOTE — Patient Instructions (Signed)
Medication Instructions:  Your physician recommends that you continue on your current medications as directed. Please refer to the Current Medication list given to you today.  *If you need a refill on your cardiac medications before your next appointment, please call your pharmacy*   Lab Work: Rheumatoid factor, CCP and ESR today If you have labs (blood work) drawn today and your tests are completely normal, you will receive your results only by: [place] (if you have MyChart) OR A paper copy in the mail If you have any lab test that is abnormal or we need to change your treatment, we will call you to review the results.   Follow-Up: At [facility], you and your health needs are our priority.  As part of our continuing mission to provide you with exceptional heart care, we have created designated Provider Care Teams.  These Care Teams include your primary Cardiologist (physician) and Advanced Practice Providers (APPs -  Physician Assistants and Nurse Practitioners) who all work together to provide you with the care you need, when you need it.   Your next appointment:   1 year(s)  The format for your next appointment:   In Person  Provider:   Sanda Klein, MD    Important Information About Sugar

## 2022-07-30 NOTE — Progress Notes (Signed)
Cardiology office note   Date:  07/30/2022   ID:  PIERRE DELLAROCCO, DOB [birth year], MRN [id number] PCP:  Redmond School, MD  Cardiologist:  Amelie Caracci Electrophysiologist:  None   Evaluation Performed:  Follow-Up Visit  Chief Complaint: Dyspnea  History of Present Illness:    [name] is a 66 y.o. male with multiple obesity related complications including type 2 diabetes mellitus, hypercholesterolemia, essential hypertension, obstructive sleep apnea, also with COPD related to previous smoking (quit 2014) and widespread metastatic prostate cancer that is well controlled on antiandrogen therapy. Angiography in 2011 showed normal coronaries.  He is moving very slowly due to problems with back pain and also some shortness of breath.  He has a lot of pain in multiple joints including his fingers of both hands.  He is taking meloxicam twice daily.  He does not have lower extremity edema, orthopnea or PND and denies any problems with chest pain.  He has not had palpitations dizziness or syncope and has not had any recent falls.  Denies bleeding problems.  Echocardiogram performed last March 15, 2021 showed normal left ventricular systolic and diastolic function, no serious valvular problems.  The right ventricle was also described as normal in size and function.  PAP could not be estimated.  Reports 100% compliance with CPAP and denies daytime hypersomnolence.  Labs performed in October showed LDL 81, HDL 40, hemoglobin A1c 6.8%, creatinine 0.7 and normal LFTs and TSH.  Past Medical History:  Diagnosis Date   Anemia    Anxiety    Arthritis    Bladder stone    Cancer ([name]) 2007   prostate CA   COPD (chronic obstructive pulmonary disease) (HCC)    Decreased pulse 08/16/2008   LE doppler - bilateral ABIs normal, bilateral PVRs normal waveform; bilateral common iliac arteries difficult to visualize however velocities suggest less than 50% diameter reduction; bilateral LE normal velocities  w/o evidence of diameter reduction   Depression    GERD (gastroesophageal reflux disease)    Hypercholesteremia    Hypertension    Mental disorder    PTSD   Pre-diabetes    Shortness of breath    Sleep apnea    wears CPAP nightly, settings at 14    Tobacco abuse    Urinary incontinence    due to prostate removal   Past Surgical History:  Procedure Laterality Date   ANTERIOR LAT LUMBAR FUSION N/A 11/17/2012   Procedure: XLIF L3 - L4 POSTERIOR L3 -L4 FUSION INSTRUMENTATION/L2 - L3 DECOMPRESSION (REVISION OF POSTERIOR HARDWARE) 1 LEVEL;  Surgeon: Melina Schools, MD;  Location: [facility];  Service: Orthopedics;  Laterality: N/A;  procedure #1: start 0923, end 1057.    BACK SURGERY  2010 / 2014   BLADDER SUSPENSION     x 2   CARDIAC CATHETERIZATION     no PCI   CARDIOVASCULAR STRESS TEST  08/16/2008   EF 57%; inferior region has diaphragmatic attenuation, otherwise normal perfusion w/o evidence of ischemia or infarct; LV normal in size; no scintographic evidence of inducible ischemia; no ECG changes, negative for ischemia; low risk scan   CERVICAL FUSION     COLON RESECTION N/A 07/12/2013   peritoneal biopsy only, evidence of metastatic prostate cancer   COLONOSCOPY     COLONOSCOPY N/A 05/27/2013   RMR: Extrinsic mass effect at the level of the appendiceal orifice likely represents an appendiceal or periappendiceal process. Neovascular changes of the rectal mucosa consistent with prior history of radiation treatment. Multiple 3-5  mm polyps removed, several tubular adenomas. Diverticulosis.nex tcs 2019   COLONOSCOPY WITH PROPOFOL N/A 07/05/2018   Procedure: COLONOSCOPY WITH PROPOFOL;  Surgeon: Daneil Dolin, MD;  [facility];  Service: Endoscopy;  Laterality: N/A;  9:30am   COLONOSCOPY WITH PROPOFOL N/A 03/07/2021   Procedure: COLONOSCOPY WITH PROPOFOL;  Surgeon: Daneil Dolin, MD;  [facility];  Service: Endoscopy;  Laterality: N/A;  2:30pm   CYSTOSCOPY W/ URETERAL  STENT PLACEMENT N/A 10/03/2013   Procedure: CYSTOSCOPY AND REMOVAL OF BLADDER NECK STONE ;  Surgeon: Dutch Gray, MD;  [facility];  Service: Urology;  Laterality: N/A;  WITH REMOVAL OF BLADDER NECK STONE     ESOPHAGOGASTRODUODENOSCOPY (EGD) WITH ESOPHAGEAL DILATION N/A 05/27/2013   RMR: Mild erosive reflux esophagitis with soft noncritical appearing stricture status post Maloney dilation, antral erosions with reactive gastropathy but no H. pylori   ESOPHAGOGASTRODUODENOSCOPY (EGD) WITH PROPOFOL N/A 07/05/2018   Procedure: ESOPHAGOGASTRODUODENOSCOPY (EGD) WITH PROPOFOL;  Surgeon: Daneil Dolin, MD;  [facility];  Service: Endoscopy;  Laterality: N/A;   HOLMIUM LASER APPLICATION N/A 08/31/735   Procedure: HOLMIUM LASER APPLICATION;  Surgeon: Dutch Gray, MD;  [facility];  Service: Urology;  Laterality: N/A;   MALONEY DILATION N/A 07/05/2018   Procedure: Venia Minks DILATION;  Surgeon: Daneil Dolin, MD;  [facility];  Service: Endoscopy;  Laterality: N/A;   POLYPECTOMY  07/05/2018   Procedure: POLYPECTOMY;  Surgeon: Daneil Dolin, MD;  [facility];  Service: Endoscopy;;  cecal polyp , splenic flexure polyps x4   POLYPECTOMY  03/07/2021   Procedure: POLYPECTOMY;  Surgeon: Daneil Dolin, MD;  [facility];  Service: Endoscopy;;   PROSTATE SURGERY     TRANSTHORACIC ECHOCARDIOGRAM  08/16/2008   essentially normal for age   URINARY SPHINCTER IMPLANT N/A 01/31/2014   Procedure: IMPLANTATION ARTIFICIAL SPHINCTER  WITH CYSTOSCOPY;  Surgeon: Reece Packer, MD;  [facility];  Service: Urology;  Laterality: N/A;   WRIST SURGERY Right      Current Meds  Medication Sig   ARIPiprazole (ABILIFY) 10 MG tablet Take 10 mg by mouth at bedtime.   Choline Fenofibrate 135 MG capsule Take 135 mg by mouth at bedtime. trilipix   Coenzyme Q10 (CO Q 10) 100 MG CAPS Take 100 mg by mouth daily.    desipramine (NOPRAMIN) 100 MG tablet Take 100 mg by mouth  at bedtime.    enalapril (VASOTEC) 10 MG tablet Take 10 mg by mouth every morning.    enzalutamide (XTANDI) 40 MG capsule Take 160 mg by mouth daily. Takes 4 capsules daily   ferrous sulfate 325 (65 FE) MG tablet Take 325 mg by mouth daily.    Krill Oil 300 MG CAPS Take 300 mg by mouth daily.   meloxicam (MOBIC) 7.5 MG tablet Take 7.5 mg by mouth 2 (two) times daily.   Multiple Vitamin (MULTIVITAMIN WITH MINERALS) TABS tablet Take 1 tablet by mouth daily.   pantoprazole (PROTONIX) 40 MG tablet Take 1 tablet (40 mg total) by mouth daily. 30 minutes before breakfast   rosuvastatin (CRESTOR) 40 MG tablet TAKE 1 TABLET DAILY   venlafaxine XR (EFFEXOR-XR) 75 MG 24 hr capsule Take 75 mg by mouth daily.      Allergies:   Penicillins   Social History   Tobacco Use   Smoking status: Former    Packs/day: 0.25    Years: 43.00    Total pack years: 10.75  Types: Cigarettes    Quit date: 02/28/2013    Years since quitting: 9.4   Smokeless tobacco: Former    Quit date: 06/30/1980  Vaping Use   Vaping Use: Never used  Substance Use Topics   Alcohol use: No   Drug use: No     Family Hx: The patient's family history includes Colon polyps in his father; Diabetes in his brother, father, and mother; Heart disease in his mother; Hypertension in his brother, father, and mother. There is no history of Colon cancer.  ROS:   Please see the history of present illness.    All other systems are reviewed and are negative.   Prior CV studies:   The following studies were reviewed today:  ECHO 03/15/2021 1. Left ventricular ejection fraction, by estimation, is 60 to 65%. The left ventricle has normal function. The left ventricle has no regional wall motion abnormalities. There is mild left ventricular hypertrophy. Left ventricular diastolic parameters were normal. 2. Right ventricular systolic function is normal. The right ventricular size is normal. Tricuspid regurgitation signal is inadequate for  assessing PA pressure. 3. The mitral valve is grossly normal, mildly thickened and calcified. Trivial mitral valve regurgitation. 4. The aortic valve is tricuspid. Aortic valve regurgitation is not visualized. 5. The inferior vena cava is normal in size with greater than 50% respiratory variability, suggesting right atrial pressure of 3 mmHg.  Labs/Other Tests and Data Reviewed:    EKG: Ordered today and personally reviewed shows normal sinus rhythm, normal tracing Recent Labs: No results found for requested labs within last 365 days.  02/22/2021 Hemoglobin 12.9, TSH 1.5, creatinine 0.6.  Most recent hemoglobin A1c was 6.4%.  06/25/2022 Creatinine 0.7, potassium 3.9, ALT 25, TSH 1.71  Recent Lipid Panel  Lipid Panel     Component Value Date/Time   CHOL 168 06/08/2019 0819   TRIG 207 (H) 06/08/2019 0819   HDL 35 (L) 06/08/2019 0819   CHOLHDL 4.8 06/08/2019 0819   LDLCALC 97 06/08/2019 0819   LABVLDL 36 06/08/2019 0819   05/29/2022 Cholesterol 144, triglycerides 131, HDL 40, LDL 81   Wt Readings from Last 3 Encounters:  07/30/22 120.5 kg  09/16/21 122.1 kg  02/28/21 125.6 kg     Objective:    Vital Signs:  BP 110/70   Pulse 76   Ht _0  (1.88 m)   Wt 120.5 kg   SpO2 96%   BMI 34.10 kg/m      General: Alert, oriented x3, no distress, moderately obese Head: no evidence of trauma, PERRL, EOMI, no exophtalmos or lid lag, no myxedema, no xanthelasma; normal ears, nose and oropharynx Neck: normal jugular venous pulsations and no hepatojugular reflux; brisk carotid pulses without delay and no carotid bruits Chest: clear to auscultation, no signs of consolidation by percussion or palpation, normal fremitus, symmetrical and full respiratory excursions Cardiovascular: normal position and quality of the apical impulse, regular rhythm, normal first and second heart sounds, no murmurs, rubs or gallops Abdomen: no tenderness or distention, no masses by palpation, no abnormal  pulsatility or arterial bruits, normal bowel sounds, no hepatosplenomegaly Extremities: no clubbing, cyanosis or edema; 2+ radial, ulnar and brachial pulses bilaterally; 2+ right femoral, posterior tibial and dorsalis pedis pulses; 2+ left femoral, posterior tibial and dorsalis pedis pulses; no subclavian or femoral bruits He appears to have more prominent swelling in the MCP and PIP joints of both hands, although there is no redness or warmth.  Severely restricted ability to flex his fingers in  both hands.  No rheumatoid nodules found. Neurological: grossly nonfocal Psych: Normal mood and affect     ASSESSMENT & PLAN:    1. OSA (obstructive sleep apnea)   2. COPD (chronic obstructive pulmonary disease) with chronic bronchitis   3. Shortness of breath   4. Essential hypertension   5. Mixed hyperlipidemia   6. Diabetes mellitus type 2 in obese ([place])   7. Mild obesity        Exertional dyspnea: No evidence of abnormal systolic or diastolic of the left ventricle and no signs of cor pulmonale on his recent echo.  Suspect the shortness of breath is due to obesity, functional limitations from his back problems, may be COPD since he did use to smoke. HTN: Very well-controlled on current medications. HLP: LDL is now in target range on maximum dose rosuvastatin.  Continue this.  Triglycerides are normal on fenofibrate. DM: Well-controlled with diet.  Not requiring medications. OSA: Reports 100% compliance with CPAP and denies daytime hypersomnolence. Moderate obesity: He has managed to lose about 5 kg in the last 18 months and is no longer in severely obese range.  Additional weight loss would still be highly beneficial. Polyarthralgia: Examination of his hand joints suggest rheumatoid arthritis rather than degenerative joint disease.  Will check rheumatoid factor and CCP antibodies.     Medication Adjustments/Labs and Tests Ordered: Current medicines are reviewed at length with the patient  today.  Concerns regarding medicines are outlined above.   Tests Ordered: Orders Placed This Encounter  Procedures   Rheumatoid Factor   CYCLIC CITRUL PEPTIDE ANTIBODY, IGG/IGA   Sedimentation rate   EKG 12-Lead     Medication Changes: No orders of the defined types were placed in this encounter.   Patient Instructions  Medication Instructions:  Your physician recommends that you continue on your current medications as directed. Please refer to the Current Medication list given to you today.  *If you need a refill on your cardiac medications before your next appointment, please call your pharmacy*   Lab Work: Rheumatoid factor, CCP and ESR today If you have labs (blood work) drawn today and your tests are completely normal, you will receive your results only by: [place] (if you have MyChart) OR A paper copy in the mail If you have any lab test that is abnormal or we need to change your treatment, we will call you to review the results.   Follow-Up: At [facility], you and your health needs are our priority.  As part of our continuing mission to provide you with exceptional heart care, we have created designated Provider Care Teams.  These Care Teams include your primary Cardiologist (physician) and Advanced Practice Providers (APPs -  Physician Assistants and Nurse Practitioners) who all work together to provide you with the care you need, when you need it.   Your next appointment:   1 year(s)  The format for your next appointment:   In Person  Provider:   Sanda Klein, MD    Important Information About Sugar         Signed, Sanda Klein, MD  07/30/2022 11:34 AM    [place]

## 2022-07-31 LAB — RHEUMATOID FACTOR: Rheumatoid fact SerPl-aCnc: 10.1 IU/mL (ref <14.0)

## 2022-07-31 LAB — CYCLIC CITRUL PEPTIDE ANTIBODY, IGG/IGA: Cyclic Citrullin Peptide Ab: 25 units — ABNORMAL HIGH (ref 0–19)

## 2022-08-12 ENCOUNTER — Other Ambulatory Visit: Payer: Self-pay

## 2022-09-01 ENCOUNTER — Other Ambulatory Visit: Payer: Self-pay

## 2022-09-01 DIAGNOSIS — M545 Low back pain, unspecified: Secondary | ICD-10-CM

## 2022-09-01 DIAGNOSIS — M25542 Pain in joints of left hand: Secondary | ICD-10-CM

## 2022-09-03 ENCOUNTER — Telehealth: Payer: Self-pay

## 2022-09-03 NOTE — Telephone Encounter (Signed)
[facility] Rheumatology Dr.Beekman's office left message on scheduler's voice mail order placed for appointment 3 weeks ago calling to check on appointment.

## 2022-09-08 NOTE — Telephone Encounter (Signed)
Pt is requesting call back to discuss how he has not received call in regards to Rheumatology appt. He states he was told to call back today if he still hasn't received a call.

## 2022-09-08 NOTE — Telephone Encounter (Signed)
Spoke to patient advised order was placed for referral to [facility] Rheumatology 3 weeks ago.Advise someone from that office should have called with appointment.Their office closed today.He will call tomorrow to check on appointment.

## 2022-09-16 ENCOUNTER — Telehealth: Payer: Self-pay | Admitting: Cardiovascular Disease

## 2022-09-16 NOTE — Telephone Encounter (Signed)
Called to say that received the referral but need a demographic sheet on the patient. Please advise

## 2022-09-25 NOTE — Telephone Encounter (Signed)
Received fax from [facility] Rheumatology Patient has appt on 11/06/22 with Dr. Amil Amen

## 2022-11-05 ENCOUNTER — Ambulatory Visit: Payer: Medicare Other | Attending: Cardiovascular Disease | Admitting: Cardiovascular Disease

## 2022-11-05 ENCOUNTER — Encounter: Payer: Self-pay | Admitting: Cardiovascular Disease

## 2022-11-05 VITALS — BP 160/84 | HR 71 | Ht 74.0 in | Wt 272.2 lb

## 2022-11-05 DIAGNOSIS — I10 Essential (primary) hypertension: Secondary | ICD-10-CM | POA: Insufficient documentation

## 2022-11-05 DIAGNOSIS — E1169 Type 2 diabetes mellitus with other specified complication: Secondary | ICD-10-CM | POA: Diagnosis present

## 2022-11-05 DIAGNOSIS — E669 Obesity, unspecified: Secondary | ICD-10-CM | POA: Diagnosis present

## 2022-11-05 DIAGNOSIS — G4733 Obstructive sleep apnea (adult) (pediatric): Secondary | ICD-10-CM | POA: Insufficient documentation

## 2022-11-05 DIAGNOSIS — J4489 Other specified chronic obstructive pulmonary disease: Secondary | ICD-10-CM | POA: Diagnosis present

## 2022-11-05 DIAGNOSIS — F32A Depression, unspecified: Secondary | ICD-10-CM | POA: Diagnosis present

## 2022-11-05 NOTE — Progress Notes (Signed)
Cardiology Office Note    Date:  11/10/2022   ID:  MILES NEUPERT, DOB 04-08-1956, MRN ID:[id number]  PCP:  Redmond School, MD  Cardiologist:  Shelva Majestic, MD (sleep); Dr. Candie Echevaria  60-month follow-up sleep evaluation  History of Present Illness:  JABRIEL HIGINBOTHAM is a 67 y.o. male who has a history of hypertension, hyperlipidemia, COPD, type 2 diabetes mellitus, obesity, as well as metastatic prostate CA on anti-antigen therapy.  He has a history of obstructive sleep apnea that dates back to 2012.  He had an old Respironics unit which is an auto unit which is set at 4 -20.  He brought his machine with him to the office today.  He has not been followed by a sleep physician only saw Dr. Chancy Milroy once after initiation of treatment.  In 2012.  Recently, he has not been sleeping well.  He is not getting adequate pressures.  The machine tends to cut off at times.  A download reveals a 30 day sleep average of 8 hours and 30 minutes.  Days greater than 4 hours were 29 out of 30.  AHI was 3.0.  He has noticed breakthrough snoring.  He wakes up at least 3 times per night for nocturia.  He is unaware of restless legs.  He denies bruxism.  An Epworth Sleepiness Scale score was calculated in the office today and this endorsed at 7 with a slight chance of dozing while sitting and reading, as a passenger in a car for an hour without a break, and while sitting quietly after lunch.  There is a moderate chance of dozing while watching television, and lying down to rest in the afternoon.   When I saw him for initial sleep evaluation in January 2019 his machine was beginning to malfunction and he was feeling that he was not getting adequate treatment.  Since it is been 7 to 8 years since his initial evaluation I recommended a repeat evaluation.  Apparently he had this done at [facility].  His diagnostic study on October 06, 2017 with mild sleep apnea overall with an AHI of 8.3, but his RDI was 14.9.  However, he had  severe sleep apnea with rem sleep with an AHI 51.4 and significant oxygen desaturation to 81%.  He also had frequent periodic limb movement disorder of sleep.  On that night, he did not meet split night criteria and returned in March for CPAP titration.  This was an inadequate titration.  Consequently he was set up with a CPAP auto unit with a minimum pressure of 6 a maximum of 20.  I saw him in May 2019 at which time he was 100% compliant since reinitiation December 14, 2017.  He is averaging 8 hours and 18 minutes of sleep per night.  AHI is excellent at 1.2.  There was only few days with mask leak.  His 95th percentile pressure was 13.5 with maximum average pressure 15.5.  Typically he goes to bed arouind 10 p.m. and wakes up at 8 am.  He notices significant improvement with this new machine.  It is much quieter.  He is sleeping well.  He does not have any breakthrough snoring. He denies any issues with painful restless legs despite being documented to have increased limb movement disorder of sleep.   I evaluated him on 01/26/2019 in a telemedicine visit at which time he had continued to do well since his prior evaluation.  I was able to a new download  from Dec 27, 2018 through January 25, 2019.  He is 100% compliant.  Average sleep duration is 9 hours and 13 minutes.  CPAP is set at a pressure range of 10 to 20 cm of water.  AHI is excellent at 0.9.  There is no leak.  His 95th percentile pressure is 12.6 with a maximum average pressure at 14.4.  He has a ResMed F 20 fullface mask.  He admits to not being very active particularly during this COVID-19 pandemic.  He denies any chest pain PND orthopnea or palpitations.  He continues to be on therapy for prostate CA followed by Dr. Alinda Money.   I last evaluated him on February 02, 2020 in a telemedicine evaluation.  At that time, he continued to do well and had seen Dr. Recardo Evangelist for a yearly follow-up evaluation.  From a sleep perspective, he has felt well and has continued to  use CPAP.  I obtained a new download from May 9 through January 30, 2020.  This continues to show excellent compliance.  He has used his machine every night except to when he was in [place].  Average sleep duration is 10 hours and 44 minutes.  His CPAP auto unit is set at a pressure range of 10 to 20 cm.  95th percentile pressure is 13.9 with a maximum average pressure of 15.6 cm of water.  AHI is excellent at 1.4/h.  There is no leak.  He was using a ResMed AirFit F20 fullface mask.  He is unaware of any breakthrough snoring.  He denies residual daytime sleepiness.  [employer] is his DME company.  He continues to follow with Dr. Alinda Money for his prostate CA.  His blood pressure has been stable on enalapril 10 mg daily.  He continues to take Effexor and Abilify for depression.  He is on pantoprazole for GERD.  He has not had significant success with weight loss.  He continues to be on rosuvastatin for hyperlipidemia   Since my last evaluation, he was seen by Dr. Daneil Dan 2022 and had experience of very mild shortness of breath with activity.  He underwent a follow-up echo Doppler study in March 15, 2021 which essentially was normal.  He was advised that he may need to see a pulmonary specialist to adjust inhalers.  Weight loss was recommended.   I last saw him on September 16, 2021 at which time he felt well and admitted to 100% CPAP compliance. I obtained a download of his CPAP from August 15, 2021 through September 13, 2021.  Compliance remains excellent with average use of 7 hours and 21 minutes per night.  His CPAP is set at a 10 to 20 cm pressure range and his 95th percentile is 12.5 with maximum average pressure 13.8 cm of water.  AHI is excellent at 1.0.  He is unaware of breakthrough snoring.  His sleep is restorative.  An Epworth scale was calculated in the office today and this endorsed at 6 arguing against residual daytime sleepiness.  He states his blood pressure has been stable and he has continued  to be on enalapril 10 mg daily.  He takes rosuvastatin for hyperlipidemia.  He has been taking desipramine 100 mg at bedtime for sleep.  He continued to be on Abilify for depression and Effexor.    Since I last saw him, over the past year he has seen Dr. Sallyanne Kuster last in December 2023.  He was stable from a cardiovascular perspective.  He has continued to  use CPAP with 100% compliance.  I obtained a download from October 06, 2022 through November 04, 2022.  Compliance continues to be excellent with average use of 7 hours and 32 minutes per night.  His pressure is set at a range of 10 to 20 cm of water and AHI 0.9 with his 95th percentile pressure at 12.7 and maximum average pressure at 14.2 cm of water.  He is unaware of breakthrough snoring.  He is sleeping well.  He denies residual daytime sleepiness.  An Epworth scale was recalculated in the office today and this endorsed at 5.  He presents for yearly evaluation.  Past Medical History:  Diagnosis Date   Anemia    Anxiety    Arthritis    Bladder stone    Cancer ([place]) 2007   prostate CA   COPD (chronic obstructive pulmonary disease) (HCC)    Decreased pulse 08/16/2008   LE doppler - bilateral ABIs normal, bilateral PVRs normal waveform; bilateral common iliac arteries difficult to visualize however velocities suggest less than 50% diameter reduction; bilateral LE normal velocities w/o evidence of diameter reduction   Depression    GERD (gastroesophageal reflux disease)    Hypercholesteremia    Hypertension    Mental disorder    PTSD   Pre-diabetes    Shortness of breath    Sleep apnea    wears CPAP nightly, settings at 14    Tobacco abuse    Urinary incontinence    due to prostate removal    Past Surgical History:  Procedure Laterality Date   ANTERIOR LAT LUMBAR FUSION N/A 11/17/2012   Procedure: XLIF L3 - L4 POSTERIOR L3 -L4 FUSION INSTRUMENTATION/L2 - L3 DECOMPRESSION (REVISION OF POSTERIOR HARDWARE) 1 LEVEL;  Surgeon: Melina Schools,  MD;  Location: [facility];  Service: Orthopedics;  Laterality: N/A;  procedure #1: start 0923, end 1057.    BACK SURGERY  2010 / 2014   BLADDER SUSPENSION     x 2   CARDIAC CATHETERIZATION     no PCI   CARDIOVASCULAR STRESS TEST  08/16/2008   EF 57%; inferior region has diaphragmatic attenuation, otherwise normal perfusion w/o evidence of ischemia or infarct; LV normal in size; no scintographic evidence of inducible ischemia; no ECG changes, negative for ischemia; low risk scan   CERVICAL FUSION     COLON RESECTION N/A 07/12/2013   peritoneal biopsy only, evidence of metastatic prostate cancer   COLONOSCOPY     COLONOSCOPY N/A 05/27/2013   RMR: Extrinsic mass effect at the level of the appendiceal orifice likely represents an appendiceal or periappendiceal process. Neovascular changes of the rectal mucosa consistent with prior history of radiation treatment. Multiple 3-5 mm polyps removed, several tubular adenomas. Diverticulosis.nex tcs 2019   COLONOSCOPY WITH PROPOFOL N/A 07/05/2018   Procedure: COLONOSCOPY WITH PROPOFOL;  Surgeon: Daneil Dolin, MD;  [facility];  Service: Endoscopy;  Laterality: N/A;  9:30am   COLONOSCOPY WITH PROPOFOL N/A 03/07/2021   Procedure: COLONOSCOPY WITH PROPOFOL;  Surgeon: Daneil Dolin, MD;  [facility];  Service: Endoscopy;  Laterality: N/A;  2:30pm   CYSTOSCOPY W/ URETERAL STENT PLACEMENT N/A 10/03/2013   Procedure: CYSTOSCOPY AND REMOVAL OF BLADDER NECK STONE ;  Surgeon: Dutch Gray, MD;  Location: [facility];  Service: Urology;  Laterality: N/A;  WITH REMOVAL OF BLADDER NECK STONE     ESOPHAGOGASTRODUODENOSCOPY (EGD) WITH ESOPHAGEAL DILATION N/A 05/27/2013   RMR: Mild erosive reflux esophagitis with soft noncritical appearing stricture status post  Maloney dilation, antral erosions with reactive gastropathy but no H. pylori   ESOPHAGOGASTRODUODENOSCOPY (EGD) WITH PROPOFOL N/A 07/05/2018   Procedure: ESOPHAGOGASTRODUODENOSCOPY (EGD) WITH  PROPOFOL;  Surgeon: Daneil Dolin, MD;  [facility];  Service: Endoscopy;  Laterality: N/A;   HOLMIUM LASER APPLICATION N/A [id number]   Procedure: HOLMIUM LASER APPLICATION;  Surgeon: Dutch Gray, MD;  Location: [facility];  Service: Urology;  Laterality: N/A;   MALONEY DILATION N/A 07/05/2018   Procedure: Venia Minks DILATION;  Surgeon: Daneil Dolin, MD;  [facility];  Service: Endoscopy;  Laterality: N/A;   POLYPECTOMY  07/05/2018   Procedure: POLYPECTOMY;  Surgeon: Daneil Dolin, MD;  [facility];  Service: Endoscopy;;  cecal polyp , splenic flexure polyps x4   POLYPECTOMY  03/07/2021   Procedure: POLYPECTOMY;  Surgeon: Daneil Dolin, MD;  [facility];  Service: Endoscopy;;   PROSTATE SURGERY     TRANSTHORACIC ECHOCARDIOGRAM  08/16/2008   essentially normal for age   URINARY SPHINCTER IMPLANT N/A 01/31/2014   Procedure: IMPLANTATION ARTIFICIAL SPHINCTER  WITH CYSTOSCOPY;  Surgeon: Reece Packer, MD;  Location[facility];  Service: Urology;  Laterality: N/A;   WRIST SURGERY Right     Current Medications: Outpatient Medications Prior to Visit  Medication Sig Dispense Refill   ARIPiprazole (ABILIFY) 10 MG tablet Take 10 mg by mouth at bedtime.     Choline Fenofibrate 135 MG capsule Take 135 mg by mouth at bedtime. trilipix     Coenzyme Q10 (CO Q 10) 100 MG CAPS Take 100 mg by mouth daily.      desipramine (NOPRAMIN) 100 MG tablet Take 100 mg by mouth at bedtime.   0   enalapril (VASOTEC) 10 MG tablet Take 10 mg by mouth every morning.      enzalutamide (XTANDI) 40 MG capsule Take 160 mg by mouth daily. Takes 4 capsules daily     ferrous sulfate 325 (65 FE) MG tablet Take 325 mg by mouth daily.   0   Krill Oil 300 MG CAPS Take 300 mg by mouth daily.     meloxicam (MOBIC) 7.5 MG tablet Take 7.5 mg by mouth 2 (two) times daily.     Multiple Vitamin (MULTIVITAMIN WITH MINERALS) TABS tablet Take 1 tablet by mouth daily.     pantoprazole  (PROTONIX) 40 MG tablet Take 1 tablet (40 mg total) by mouth daily. 30 minutes before breakfast 90 tablet 3   rosuvastatin (CRESTOR) 40 MG tablet TAKE 1 TABLET DAILY 90 tablet 3   venlafaxine XR (EFFEXOR-XR) 75 MG 24 hr capsule Take 75 mg by mouth daily.   0   No facility-administered medications prior to visit.     Allergies:   Penicillins   Social History   Socioeconomic History   Marital status: Married    Spouse name: Not on file   Number of children: Not on file   Years of education: Not on file   Highest education level: Not on file  Occupational History   Occupation: driving truck    Comment: retired  Tobacco Use   Smoking status: Former    Packs/day: 0.25    Years: 43.00    Additional pack years: 0.00    Total pack years: 10.75    Types: Cigarettes    Quit date: 02/28/2013    Years since quitting: 9.7   Smokeless tobacco: Former    Quit date: 06/30/1980  Vaping Use   Vaping Use: Never used  Substance and Sexual Activity   Alcohol use: No   Drug use: No   Sexual activity: Yes    Birth control/protection: None  Other Topics Concern   Not on file  Social History Narrative   Not on file   Social Determinants of Health   Financial Resource Strain: Not on file  Food Insecurity: Not on file  Transportation Needs: Not on file  Physical Activity: Not on file  Stress: Not on file  Social Connections: Not on file    Retired as a Administrator at age 51  Family History:  The patient's family history includes Colon polyps in his father; Diabetes in his brother, father, and mother; Heart disease in his mother; Hypertension in his brother, father, and mother.   ROS General: Negative; No fevers, chills, or night sweats;  HEENT: Negative; No changes in vision or hearing, sinus congestion, difficulty swallowing Pulmonary: Occasional wheezing improved with inhaler Cardiovascular: Hypertension, no chest pain GI: GERD GU: Negative; No dysuria, hematuria, or difficulty  voiding Musculoskeletal: Negative; no myalgias, joint pain, or weakness Hematologic/Oncology: Negative; no easy bruising, bleeding Endocrine: Negative; no heat/cold intolerance; no diabetes Neuro: Negative; no changes in balance, headaches Skin: Negative; No rashes or skin lesions Psychiatric: History of depression Sleep: Positive for OSA on CPAP; No snoring, daytime sleepiness, hypersomnolence, bruxism, restless legs, hypnogognic hallucinations, no cataplexy An Epworth Sleepiness Scale score was calculated in the office today and this endorsed at 5.  Other comprehensive 14 point system review is negative.   PHYSICAL EXAM:   VS:  BP (!) 160/84   Pulse 71   Ht 6\' 2"  (1.88 m)   Wt 272 lb 3.2 oz (123.5 kg)   SpO2 96%   BMI 34.95 kg/m     Repeat blood pressure by me was 124/78  Wt Readings from Last 3 Encounters:  11/05/22 272 lb 3.2 oz (123.5 kg)  07/30/22 265 lb 9.6 oz (120.5 kg)  09/16/21 269 lb 3.2 oz (122.1 kg)    General: Alert, oriented, no distress.  Skin: normal turgor, no rashes, warm and dry HEENT: Normocephalic, atraumatic. Pupils equal round and reactive to light; sclera anicteric; extraocular muscles intact; Nose without nasal septal hypertrophy Mouth/Parynx benign; Mallinpatti scale 3/4 Neck: No JVD, no carotid bruits; normal carotid upstroke Lungs: clear to ausculatation and percussion; no wheezing or rales Chest wall: without tenderness to palpitation Heart: PMI not displaced, RRR, s1 s2 normal, 1/6 systolic murmur, no diastolic murmur, no rubs, gallops, thrills, or heaves Abdomen: soft, nontender; no hepatosplenomehaly, BS+; abdominal aorta nontender and not dilated by palpation. Back: no CVA tenderness Pulses 2+ Musculoskeletal: full range of motion, normal strength, no joint deformities Extremities: no clubbing cyanosis or edema, Homan's sign negative  Neurologic: grossly nonfocal; Cranial nerves grossly wnl Psychologic: Normal mood and  affect   Studies/Labs Reviewed:   I personally reviewed the ECG of July 30, 2022 which showed normal sinus rhythm at 76 bpm.  I personally reviewed the ECG of February 27, 2021 which revealed normal sinus rhythm at 72 with normal intervals and no ectopy.  I personally reviewed the ECG of January 31, 2020 which showed normal sinus rhythm at 78 without ectopy.  No ST segment changes, QTc interval 450 ms.  Recent Labs:    Latest Ref Rng & Units 06/08/2019    8:23 AM 01/24/2019   10:26 AM 06/30/2018   11:13 AM  BMP  Glucose 65 - 99 mg/dL 128  121  136   BUN 8 - 27  mg/dL 22  21  19    Creatinine 0.76 - 1.27 mg/dL 0.95  1.02  0.90   BUN/Creat Ratio 10 - 24 23  21     Sodium 134 - 144 mmol/L 141  140  141   Potassium 3.5 - 5.2 mmol/L 4.3  4.7  4.4   Chloride 96 - 106 mmol/L 103  102  108   CO2 20 - 29 mmol/L 23  23  25    Calcium 8.6 - 10.2 mg/dL 9.9  10.5  9.3         Latest Ref Rng & Units 06/08/2019    8:23 AM 01/24/2019   10:26 AM 07/06/2013   11:23 AM  Hepatic Function  Total Protein 6.0 - 8.5 g/dL 7.3  7.4  7.3   Albumin 3.8 - 4.8 g/dL 4.6  5.1  4.1   AST 0 - 40 IU/L 15  15  47   ALT 0 - 44 IU/L 24  15  18    Alk Phosphatase 39 - 117 IU/L 54  51  62   Total Bilirubin 0.0 - 1.2 mg/dL <0.2  0.2  0.2        Latest Ref Rng & Units 02/28/2021   11:11 AM 01/24/2019   10:26 AM 06/30/2018   11:13 AM  CBC  WBC 4.0 - 10.5 K/uL 4.5  7.1  6.7   Hemoglobin 13.0 - 17.0 g/dL 12.0  13.5  11.7   Hematocrit 39.0 - 52.0 % 37.4  41.7  37.7   Platelets 150 - 400 K/uL 307  420  281    Lab Results  Component Value Date   MCV 102.2 (H) 02/28/2021   MCV 99 (H) 01/24/2019   MCV 97.9 06/30/2018   No results found for: "TSH" No results found for: "HGBA1C"   BNP No results found for: "BNP"  ProBNP No results found for: "PROBNP"   Lipid Panel     Component Value Date/Time   CHOL 168 06/08/2019 0819   TRIG 207 (H) 06/08/2019 0819   HDL 35 (L) 06/08/2019 0819   CHOLHDL 4.8 06/08/2019 0819    LDLCALC 97 06/08/2019 0819   LABVLDL 36 06/08/2019 0819     RADIOLOGY: No results found.   Additional studies/ records that were reviewed today include:     March 10. 2019 CLINICAL INFORMATION The patient is referred for a CPAP titration to treat sleep apnea.   Date of NPSG: 10/06/2017: AHI 8.3/h; RDI 14.9/h; AHI during REM sleep 51.4/h; , oxygen desaturation to 81%.   SLEEP STUDY TECHNIQUE As per the AASM Manual for the Scoring of Sleep and Associated Events v2.3 (April 2016) with a hypopnea requiring 4% desaturations.   The channels recorded and monitored were frontal, central and occipital EEG, electrooculogram (EOG), submentalis EMG (chin), nasal and oral airflow, thoracic and abdominal wall motion, anterior tibialis EMG, snore microphone, electrocardiogram, and pulse oximetry. Continuous positive airway pressure (CPAP) was initiated at the beginning of the study and titrated to treat sleep-disordered breathing.   MEDICATIONS     Calcium Carb-Cholecalciferol (CALCIUM 1000 + D PO)         Choline Fenofibrate (TRILIPIX) 135 MG capsule         Coenzyme Q10 (CO Q 10) 100 MG CAPS         desipramine (NOPRAMIN) 100 MG tablet         enalapril (VASOTEC) 10 MG tablet         ferrous sulfate 325 (65 FE) MG tablet  Krill Oil 300 MG CAPS         leuprolide (LUPRON) 30 MG injection         megestrol (MEGACE) 20 MG tablet         meloxicam (MOBIC) 7.5 MG tablet         Multiple Vitamin (MULTIVITAMIN WITH MINERALS) TABS tablet         pantoprazole (PROTONIX) 40 MG tablet         simvastatin (ZOCOR) 40 MG tablet         venlafaxine XR (EFFEXOR-XR) 75 MG 24 hr capsule       Medications self-administered by patient taken the night of the study : N/A   TECHNICIAN COMMENTS Comments added by technician: CPAP therapy started at 5 cm of H20 and increased to 7 cm of H20 in lateral positions. Patient unable to obtain sleep in supine position. PLMs were noted throughout testing. Patient  tolerated CPAP very well. Suboptimal pressure obtained due to supine REM stage not observed. Oral breathing noticed throughout therapy   RESPIRATORY PARAMETERS Optimal PAP Pressure (cm):   AHI at Optimal Pressure (/hr):            N/A Overall Minimal O2 (%):         92.0     Supine % at Optimal Pressure (%):    N/A Minimal O2 at Optimal Pressure (%): 92.0        SLEEP ARCHITECTURE The study was initiated at 9:52:40 PM and ended at 5:51:32 AM.   Sleep onset time was 61.7 minutes and the sleep efficiency was 60.6%%. The total sleep time was 290.1 minutes.   The patient spent 8.4%% of the night in stage N1 sleep, 58.8%% in stage N2 sleep, 24.7%% in stage N3 and 8.10% in REM.Stage REM latency was 219.0 minutes   Wake after sleep onset was 127.0. Alpha intrusion was absent. Supine sleep was 0.00%.   CARDIAC DATA The 2 lead EKG demonstrated sinus rhythm. The mean heart rate was N/A beats per minute. Other EKG findings include: None.   LEG MOVEMENT DATA The total Periodic Limb Movements of Sleep (PLMS) were 0. The PLMS index was 0.0. A PLMS index of <15 is considered normal in adults.   IMPRESSIONS - An optimal PAP pressure could not be selected for this patient based on the available study data. - Central sleep apnea was not noted during this titration (CAI = 0.0/h). - Significant oxygen desaturations were not observed during this titration (min O2 = 92.0%). - The patient snored with loud snoring volume during this titration study. - No cardiac abnormalities were observed during this study. - Clinically significant periodic limb movements were not noted during this study. Arousals associated with PLMs were rare.   DIAGNOSIS - Obstructive Sleep Apnea (327.23 [G47.33 ICD-10])   RECOMMENDATIONS - Since the patient had previously documented severe sleep apnea during REM sleep whichwas not achieved on the present titration, recommend an initial trial of CPAP Auto with C-Flex/EPR of 3 at 6- 20  cm H2O with heated humidification and mask of choice. - Efforts should be made to maximize nasal and oral pharyngeal patency. - Avoid alcohol, sedatives and other CNS depressants that may worsen sleep apnea and disrupt normal sleep architecture. - Sleep hygiene should be reviewed to assess factors that may improve sleep quality. - Weight management and regular exercise should be initiated or continued. - Recommend adownload be obtained in 30 days and the patient return to sleep clinic for re-evaluation  after 4 weeks of therapy.    ASSESSMENT:    1. OSA (obstructive sleep apnea)   2. Essential hypertension   3. Diabetes mellitus type 2 in obese ([place])   4. Depression, unspecified depression type   5. COPD (chronic obstructive pulmonary disease) with chronic bronchitis   6. Mild obesity     PLAN:  Mr. Amro Discala is a 67 year-old gentleman who has a history of hypertension, hyperlipidemia, COPD, diabetes mellitus, obesity, metastatic prostate CA, as well as obstructive sleep apnea.  His diagnosis of OSA dates back to 2012.  Initially he had had an old Respironics CPAP machine and on December 14, 2017 he received a ResMed air sense 10 AutoSet unit.  His current machine is set at a pressure range of 10 to 20 cm.  He has had consistent compliance.  [employer] is his DME company.  I obtained a new download from October 06, 2022 through November 04, 2022 which showed an excellent AHI at 0.9 and his 95th percentile pressure 12.7 with maximum average pressure 14.2 cm of water.  He was using it with 100% compliance and averaging 7 hours and 32 minutes of CPAP use per night.  He is unaware of breakthrough snoring.  He does not have any residual daytime sleepiness with an Epworth scale calculated at 5 today.  His blood pressure today is stable when we repeated by me at 124/78 on his current regimen of enalapril 10 mg.  He is on rosuvastatin 40 mg for hyperlipidemia and is on Abilify and Effexor for anxiety/depression.   His diabetes is controlled with diet.  He will follow-up with Dr. Sallyanne Kuster.  I will see him in 1 year for follow-up sleep evaluation.   Medication Adjustments/Labs and Tests Ordered: Current medicines are reviewed at length with the patient today.  Concerns regarding medicines are outlined above.  Medication changes, Labs and Tests ordered today are listed in the Patient Instructions below. Patient Instructions  Medication Instructions:  Your physician recommends that you continue on your current medications as directed. Please refer to the Current Medication list given to you today.  *If you need a refill on your cardiac medications before your next appointment, please call your pharmacy*  Follow-Up: At [facility]: [facility], you and your health needs are our priority.  As part of our continuing mission to provide you with exceptional heart care, we have created designated Provider Care Teams.  These Care Teams include your primary Cardiologist (physician) and Advanced Practice Providers (APPs -  Physician Assistants and Nurse Practitioners) who all work together to provide you with the care you need, when you need it.  We recommend signing up for the patient portal called "MyChart".  Sign up information is provided on this After Visit Summary.  MyChart is used to connect with patients for Virtual Visits (Telemedicine).  Patients are able to view lab/test results, encounter notes, upcoming appointments, etc.  Non-urgent messages can be sent to your provider as well.   To learn more about what you can do with MyChart, go to [email or web address].    Your next appointment:   12 month(s)  Provider:   Dr. Claiborne Billings (sleep clinic)    Signed, Shelva Majestic, MD  11/10/2022 12:49 PM    [facility] [address], [place], [place], [place]  [address] Phone: [phone]

## 2022-11-05 NOTE — Patient Instructions (Signed)
Medication Instructions:  Your physician recommends that you continue on your current medications as directed. Please refer to the Current Medication list given to you today.  *If you need a refill on your cardiac medications before your next appointment, please call your pharmacy*  Follow-Up: At [facility], you and your health needs are our priority.  As part of our continuing mission to provide you with exceptional heart care, we have created designated Provider Care Teams.  These Care Teams include your primary Cardiologist (physician) and Advanced Practice Providers (APPs -  Physician Assistants and Nurse Practitioners) who all work together to provide you with the care you need, when you need it.  We recommend signing up for the patient portal called "MyChart".  Sign up information is provided on this After Visit Summary.  MyChart is used to connect with patients for Virtual Visits (Telemedicine).  Patients are able to view lab/test results, encounter notes, upcoming appointments, etc.  Non-urgent messages can be sent to your provider as well.   To learn more about what you can do with MyChart, go to [email or web address].    Your next appointment:   12 month(s)  Provider:   Dr. Kelly-sleep clinic 

## 2022-11-10 ENCOUNTER — Encounter: Payer: Self-pay | Admitting: Cardiovascular Disease

## 2022-11-26 ENCOUNTER — Encounter: Payer: Medicare Other | Admitting: Internal Medicine

## 2022-12-28 NOTE — Progress Notes (Unsigned)
GI Office Note    Referring Provider: [name], MD Primary Care Physician:  [name], MD Primary Gastroenterologist: [name].Rourk, MD  Date:  12/29/2022  ID:  [name], DOB 01-25-1956, MRN [id number]   Chief Complaint   Chief Complaint  Patient presents with   Dysphagia    Food is getting stuck in throat.    History of Present Illness  [name] is a 67 y.o. male with a history of adenomatous colon polyps, metastatic prostate cancer s/p peritoneal implants and radiation proctitis, Schatzki's rings s/p dilation, COPD, OSA with CPAP, tobacco use, hypercholesteremia, HTN, GERD, anxiety and depression presenting today with complaint of dysphagia.   Colonoscopy 07/05/18: -Five 4-6 mm polyps at the splenic flexure and cecum (sessile serrated polyps) -Mild sigmoid and descending diverticulosis -Repeat colonoscopy in 3 years  EGD 07/05/18: -Mild Schatzki's ring at GE junction s/p dilation -Small hiatal hernia -Normal stomach -Normal duodenum s/p biopsy -Advised no repeat upper endoscopy  Last office visit 12/18/20.  History of sleep apnea, using CPAP nightly.  Reported dysphagia had improved.  Reflux well-controlled on pantoprazole 40 mg daily.  Known history of metastatic prostate cancer with peritoneal implants and history of radiation proctitis.  Offered surveillance colonoscopy, ASA 4.  Advise continue pantoprazole 40 mg once daily.  Colonoscopy 03/07/21: -Nonbleeding internal hemorrhoids -two 4 to 6 mm polyps in the splenic flexure (tubular adenoma) -Pancolonic diverticulosis -Advised repeat colonoscopy in 5 years  Today: Having issues even with ice cream and burping it up. Has been going on or about 6 months and has been worsening and now occurring with every meal. Has a good appetite. No N/V, odynophagia. No weight loss. Food gets stuck at the base of his neck and is slow to go down. Has not had regurgitation.   Heartburn well controlled with  pantoprazole 40 mg once daily.   Denies constipation, diarrhea, melena, brpbr, abdominal pain. Denis chest pain, lower extremity. No worsening shortness of breath above his baseline COPD. Still using CPAP at night. Tacking iron daily.   Following with urology for prostate cancer - reports it is spreading.    Current Outpatient Medications  Medication Sig Dispense Refill   ARIPiprazole (ABILIFY) 10 MG tablet Take 10 mg by mouth at bedtime.     Choline Fenofibrate 135 MG capsule Take 135 mg by mouth at bedtime. trilipix     Coenzyme Q10 (CO Q 10) 100 MG CAPS Take 100 mg by mouth daily.      desipramine (NOPRAMIN) 100 MG tablet Take 100 mg by mouth at bedtime.   0   enalapril (VASOTEC) 10 MG tablet Take 10 mg by mouth every morning.      enzalutamide (XTANDI) 40 MG capsule Take 160 mg by mouth daily. Takes 4 capsules daily     ferrous sulfate 325 (65 FE) MG tablet Take 325 mg by mouth daily.   0   hydroxychloroquine (PLAQUENIL) 200 MG tablet Take 200 mg by mouth 2 (two) times daily.     Krill Oil 300 MG CAPS Take 300 mg by mouth daily.     meloxicam (MOBIC) 7.5 MG tablet Take 7.5 mg by mouth 2 (two) times daily.     Multiple Vitamin (MULTIVITAMIN WITH MINERALS) TABS tablet Take 1 tablet by mouth daily.     pantoprazole (PROTONIX) 40 MG tablet Take 1 tablet (40 mg total) by mouth daily. 30 minutes before breakfast 90 tablet 3   rosuvastatin (CRESTOR) 40 MG tablet TAKE 1 TABLET DAILY  90 tablet 3   venlafaxine XR (EFFEXOR-XR) 75 MG 24 hr capsule Take 75 mg by mouth daily.   0   No current facility-administered medications for this visit.    Past Medical History:  Diagnosis Date   Anemia    Anxiety    Arthritis    Bladder stone    Cancer (HCC) 2007   prostate CA   COPD (chronic obstructive pulmonary disease) (HCC)    Decreased pulse 08/16/2008   LE doppler - bilateral ABIs normal, bilateral PVRs normal waveform; bilateral common iliac arteries difficult to visualize however velocities  suggest less than 50% diameter reduction; bilateral LE normal velocities w/o evidence of diameter reduction   Depression    GERD (gastroesophageal reflux disease)    Hypercholesteremia    Hypertension    Mental disorder    PTSD   Pre-diabetes    Shortness of breath    Sleep apnea    wears CPAP nightly, settings at 14    Tobacco abuse    Urinary incontinence    due to prostate removal    Past Surgical History:  Procedure Laterality Date   ANTERIOR LAT LUMBAR FUSION N/A 11/17/2012   Procedure: XLIF L3 - L4 POSTERIOR L3 -L4 FUSION INSTRUMENTATION/L2 - L3 DECOMPRESSION (REVISION OF POSTERIOR HARDWARE) 1 LEVEL;  Surgeon: Venita Lick, MD;  Location: [facility];  Service: Orthopedics;  Laterality: N/A;  procedure #1: start 0923, end 1057.    BACK SURGERY  2010 / 2014   BLADDER SUSPENSION     x 2   CARDIAC CATHETERIZATION     no PCI   CARDIOVASCULAR STRESS TEST  08/16/2008   EF 57%; inferior region has diaphragmatic attenuation, otherwise normal perfusion w/o evidence of ischemia or infarct; LV normal in size; no scintographic evidence of inducible ischemia; no ECG changes, negative for ischemia; low risk scan   CERVICAL FUSION     COLON RESECTION N/A 07/12/2013   peritoneal biopsy only, evidence of metastatic prostate cancer   COLONOSCOPY     COLONOSCOPY N/A 05/27/2013   RMR: Extrinsic mass effect at the level of the appendiceal orifice likely represents an appendiceal or periappendiceal process. Neovascular changes of the rectal mucosa consistent with prior history of radiation treatment. Multiple 3-5 mm polyps removed, several tubular adenomas. Diverticulosis.nex tcs 2019   COLONOSCOPY WITH PROPOFOL N/A 07/05/2018   Procedure: COLONOSCOPY WITH PROPOFOL;  Surgeon: Corbin Ade, MD;  Location: [facility];  Service: Endoscopy;  Laterality: N/A;  9:30am   COLONOSCOPY WITH PROPOFOL N/A 03/07/2021   Procedure: COLONOSCOPY WITH PROPOFOL;  Surgeon: Corbin Ade, MD;  Location: [facility];  Service: Endoscopy;  Laterality: N/A;  2:30pm   CYSTOSCOPY W/ URETERAL STENT PLACEMENT N/A 10/03/2013   Procedure: CYSTOSCOPY AND REMOVAL OF BLADDER NECK STONE ;  Surgeon: Crecencio Mc, MD;  Location: [facility];  Service: Urology;  Laterality: N/A;  WITH REMOVAL OF BLADDER NECK STONE     ESOPHAGOGASTRODUODENOSCOPY (EGD) WITH ESOPHAGEAL DILATION N/A 05/27/2013   RMR: Mild erosive reflux esophagitis with soft noncritical appearing stricture status post Maloney dilation, antral erosions with reactive gastropathy but no H. pylori   ESOPHAGOGASTRODUODENOSCOPY (EGD) WITH PROPOFOL N/A 07/05/2018   Procedure: ESOPHAGOGASTRODUODENOSCOPY (EGD) WITH PROPOFOL;  Surgeon: Corbin Ade, MD;  [facility];  Service: Endoscopy;  Laterality: N/A;   HOLMIUM LASER APPLICATION N/A 10/03/2013   Procedure: HOLMIUM LASER APPLICATION;  Surgeon: Crecencio Mc, MD;  Location: [facility];  Service: Urology;  Laterality: N/A;   MALONEY  DILATION N/A 07/05/2018   Procedure: Elease Hashimoto DILATION;  Surgeon: Corbin Ade, MD;  [facility];  Service: Endoscopy;  Laterality: N/A;   POLYPECTOMY  07/05/2018   Procedure: POLYPECTOMY;  Surgeon: Corbin Ade, MD;  [facility];  Service: Endoscopy;;  cecal polyp , splenic flexure polyps x4   POLYPECTOMY  03/07/2021   Procedure: POLYPECTOMY;  Surgeon: Corbin Ade, MD;  Location: [facility];  Service: Endoscopy;;   PROSTATE SURGERY     TRANSTHORACIC ECHOCARDIOGRAM  08/16/2008   essentially normal for age   URINARY SPHINCTER IMPLANT N/A 01/31/2014   Procedure: IMPLANTATION ARTIFICIAL SPHINCTER  WITH CYSTOSCOPY;  Surgeon: Martina Sinner, MD;  Location: [facility];  Service: Urology;  Laterality: N/A;   WRIST SURGERY Right     Family History  Problem Relation Age of Onset   Diabetes Mother    Hypertension Mother    Heart disease Mother    Diabetes Father    Hypertension Father    Colon polyps Father        age 1   Hypertension Brother     Diabetes Brother    Colon cancer Neg Hx     Allergies as of 12/29/2022 - Review Complete 12/29/2022  Allergen Reaction Noted   Penicillins Anaphylaxis 09/30/2011    Social History   Socioeconomic History   Marital status: Married    Spouse name: Not on file   Number of children: Not on file   Years of education: Not on file   Highest education level: Not on file  Occupational History   Occupation: driving truck    Comment: retired  Tobacco Use   Smoking status: Former    Packs/day: 0.25    Years: 43.00    Additional pack years: 0.00    Total pack years: 10.75    Types: Cigarettes    Quit date: 02/28/2013    Years since quitting: 9.8   Smokeless tobacco: Former    Quit date: 06/30/1980  Vaping Use   Vaping Use: Never used  Substance and Sexual Activity   Alcohol use: No   Drug use: No   Sexual activity: Yes    Birth control/protection: None  Other Topics Concern   Not on file  Social History Narrative   Not on file   Social Determinants of Health   Financial Resource Strain: Not on file  Food Insecurity: Not on file  Transportation Needs: Not on file  Physical Activity: Not on file  Stress: Not on file  Social Connections: Not on file     Review of Systems   Gen: Denies fever, chills, anorexia. Denies fatigue, weakness, weight loss.  CV: Denies chest pain, palpitations, syncope, peripheral edema, and claudication. Resp: Denies dyspnea at rest, cough, wheezing, coughing up blood, and pleurisy. GI: See HPI Derm: Denies rash, itching, dry skin Psych: Denies depression, anxiety, memory loss, confusion. No homicidal or suicidal ideation.  Heme: Denies bruising, bleeding, and enlarged lymph nodes.  Physical Exam   BP 128/85 (BP Location: Right Arm, Patient Position: Sitting, Cuff Size: Large)   Pulse 81   Temp 97.7 F (36.5 C) (Temporal)   Ht 6\' 2"  (1.88 m)   Wt 268 lb (121.6 kg)   SpO2 94%   BMI 34.41 kg/m   General:   Alert and oriented. No distress  noted. Pleasant and cooperative.  Head:  Normocephalic and atraumatic. Eyes:  Conjuctiva clear without scleral icterus. Mouth:  Oral mucosa pink and moist. Good  dentition. No lesions. Lungs:  Clear to auscultation bilaterally. No wheezes, rales, or rhonchi. No distress.  Heart:  S1, S2 present without murmurs appreciated.  Abdomen:  +BS, soft, non-tender and non-distended. No rebound or guarding. No HSM or masses noted. Rectal: deferred Msk:  Symmetrical without gross deformities. Normal posture. Extremities:  Without edema. Neurologic:  Alert and  oriented x4 Psych:  Alert and cooperative. Normal mood and affect.   Assessment  [name] is a 67 y.o. male with a history of metastatic prostate cancer, anxiety, depression, GERD, Schatzki's ring, HTN, OSA, HLD, tobacco abuse presenting today for evaluation of dysphagia.  Dysphagia, GERD: Reflux symptoms well-controlled with pantoprazole 40 mg once daily.  Has been having increasing frequency of dysphagia, primarily with solids but at times even with softer liquids such as ice cream.  Has history of Schatzki's ring in the past with multiple dilations.  Symptoms have been worsening over the last 6 months.  Will schedule for repeat upper endoscopy with dilation to evaluate for recurrent Schatzki's ring, stricture, or esophageal web.  PLAN   Proceed with upper endoscopy with dilation with propofol by Dr. Jena Gauss in near future: the risks, benefits, and alternatives have been discussed with the patient in detail. The patient states understanding and desires to proceed. ASA 3 Continue pantoprazole 40 mg daily. Refilled today.  Continue iron daily.  Follow up in 4 months    Brooke Bonito, MSN, FNP-BC, AGACNP-BC [facility]

## 2022-12-29 ENCOUNTER — Encounter: Payer: Self-pay | Admitting: Gastroenterology

## 2022-12-29 ENCOUNTER — Telehealth: Payer: Self-pay | Admitting: *Deleted

## 2022-12-29 ENCOUNTER — Ambulatory Visit (INDEPENDENT_AMBULATORY_CARE_PROVIDER_SITE_OTHER): Payer: Medicare Other | Admitting: Gastroenterology

## 2022-12-29 VITALS — BP 128/85 | HR 81 | Temp 97.7°F | Ht 74.0 in | Wt 268.0 lb

## 2022-12-29 DIAGNOSIS — K219 Gastro-esophageal reflux disease without esophagitis: Secondary | ICD-10-CM | POA: Diagnosis not present

## 2022-12-29 DIAGNOSIS — R1319 Other dysphagia: Secondary | ICD-10-CM | POA: Diagnosis not present

## 2022-12-29 MED ORDER — PANTOPRAZOLE SODIUM 40 MG PO TBEC: 40.0000 mg | DELAYED_RELEASE_TABLET | Freq: Every day | ORAL | 3 refills | Status: DC

## 2022-12-29 NOTE — Patient Instructions (Signed)
We are scheduling for an upper endoscopy with dilation in the near future with Dr. Jena Gauss.  Please continue taking your pantoprazole 40 mg once daily.  I sent in a refill to [facility] for you today.  Continue taking your iron daily.  Will plan to follow-up in 4 months, sooner if needed to reassess your swallowing.  Please not hesitate to reach out if you have any questions or concerns.  It was a pleasure to meet you today!   I want to create trusting relationships with patients. If you receive a survey regarding your visit,  I greatly appreciate you taking time to fill this out on paper or through your MyChart. I value your feedback.  Brooke Bonito, MSN, FNP-BC, AGACNP-BC [facility]

## 2022-12-29 NOTE — Telephone Encounter (Signed)
[facility]  EGD/ED w/Dr.Rourk, asa 3

## 2022-12-30 ENCOUNTER — Encounter: Payer: Self-pay | Admitting: *Deleted

## 2023-01-30 ENCOUNTER — Other Ambulatory Visit: Payer: Self-pay | Admitting: Cardiovascular Disease

## 2023-02-02 NOTE — Patient Instructions (Signed)
[name]  02/02/2023     @PREFPERIOPPHARMACY @   Your procedure is scheduled on  02/05/2023.   Report to [facility] at  0930 A.M.   Call this number if you have problems the morning of surgery:  [phone]  If you experience any cold or flu symptoms such as cough, fever, chills, shortness of breath, etc. between now and your scheduled surgery, please notify us at the above number.   Remember:  Follow the diet instructions given to you by the office.     Take these medicines the morning of surgery with A SIP OF WATER          meloxicam (if needed), pantoprazole, effexor.     Do not wear jewelry, make-up or nail polish, including gel polish,  artificial nails, or any other type of covering on natural nails (fingers and  toes).  Do not wear lotions, powders, or perfumes, or deodorant.  Do not shave 48 hours prior to surgery.  Men may shave face and neck.  Do not bring valuables to the hospital.  [facility] is not responsible for any belongings or valuables.  Contacts, dentures or bridgework may not be worn into surgery.  Leave your suitcase in the car.  After surgery it may be brought to your room.  For patients admitted to the hospital, discharge time will be determined by your treatment team.  Patients discharged the day of surgery will not be allowed to drive home and must have someone with them for 24 hours.    Special instructions:   DO NOT smoke tobacco or vape for 24 hours before your procedure.  Please read over the following fact sheets that you were given. Anesthesia Post-op Instructions and Care and Recovery After Surgery        Upper Endoscopy, Adult, Care After After the procedure, it is common to have a sore throat. It is also common to have: Mild stomach pain or discomfort. Bloating. Nausea. Follow these instructions at home: The instructions below may help you care for yourself at home. Your health care provider may give you more  instructions. If you have questions, ask your health care provider. If you were given a sedative during the procedure, it can affect you for several hours. Do not drive or operate machinery until your health care provider says that it is safe. If you will be going home right after the procedure, plan to have a responsible adult: Take you home from the hospital or clinic. You will not be allowed to drive. Care for you for the time you are told. Follow instructions from your health care provider about what you may eat and drink. Return to your normal activities as told by your health care provider. Ask your health care provider what activities are safe for you. Take over-the-counter and prescription medicines only as told by your health care provider. Contact a health care provider if you: Have a sore throat that lasts longer than one day. Have trouble swallowing. Have a fever. Get help right away if you: Vomit blood or your vomit looks like coffee grounds. Have bloody, black, or tarry stools. Have a very bad sore throat or you cannot swallow. Have difficulty breathing or very bad pain in your chest or abdomen. These symptoms may be an emergency. Get help right away. Call 911. Do not wait to see if the symptoms will go away. Do not drive yourself to the hospital. Summary After the  procedure, it is common to have a sore throat, mild stomach discomfort, bloating, and nausea. If you were given a sedative during the procedure, it can affect you for several hours. Do not drive until your health care provider says that it is safe. Follow instructions from your health care provider about what you may eat and drink. Return to your normal activities as told by your health care provider. This information is not intended to replace advice given to you by your health care provider. Make sure you discuss any questions you have with your health care provider. Document Revised: 11/20/2021 Document Reviewed:  11/20/2021 Elsevier Patient Education  2024 [employer] Esophageal Dilatation Esophageal dilatation, also called esophageal dilation, is a procedure to widen or open a blocked or narrowed part of the esophagus. The esophagus is the part of the body that moves food and liquid from the mouth to the stomach. You may need this procedure if: You have a buildup of scar tissue in your esophagus that makes it difficult, painful, or impossible to swallow. This can be caused by gastroesophageal reflux disease (GERD). You have cancer of the esophagus. There is a problem with how food moves through your esophagus. In some cases, you may need this procedure repeated at a later time to dilate the esophagus gradually. Tell a health care provider about: Any allergies you have. All medicines you are taking, including vitamins, herbs, eye drops, creams, and over-the-counter medicines. Any problems you or family members have had with anesthetic medicines. Any blood disorders you have. Any surgeries you have had. Any medical conditions you have. Any antibiotic medicines you are required to take before dental procedures. Whether you are pregnant or may be pregnant. What are the risks? Generally, this is a safe procedure. However, problems may occur, including: Bleeding due to a tear in the lining of the esophagus. A hole, or perforation, in the esophagus. What happens before the procedure? Ask your health care provider about: Changing or stopping your regular medicines. This is especially important if you are taking diabetes medicines or blood thinners. Taking medicines such as aspirin and ibuprofen. These medicines can thin your blood. Do not take these medicines unless your health care provider tells you to take them. Taking over-the-counter medicines, vitamins, herbs, and supplements. Follow instructions from your health care provider about eating or drinking restrictions. Plan to have a responsible  adult take you home from the hospital or clinic. Plan to have a responsible adult care for you for the time you are told after you leave the hospital or clinic. This is important. What happens during the procedure? You may be given a medicine to help you relax (sedative). A numbing medicine may be sprayed into the back of your throat, or you may gargle the medicine. Your health care provider may perform the dilatation using various surgical instruments, such as: Simple dilators. This instrument is carefully placed in the esophagus to stretch it. Guided wire bougies. This involves using an endoscope to insert a wire into the esophagus. A dilator is passed over this wire to enlarge the esophagus. Then the wire is removed. Balloon dilators. An endoscope with a small balloon is inserted into the esophagus. The balloon is inflated to stretch the esophagus and open it up. The procedure may vary among health care providers and hospitals. What can I expect after the procedure? Your blood pressure, heart rate, breathing rate, and blood oxygen level will be monitored until you leave the hospital or clinic. Your throat  may feel slightly sore and numb. This will get better over time. You will not be allowed to eat or drink until your throat is no longer numb. When you are able to drink, urinate, and sit on the edge of the bed without nausea or dizziness, you may be able to return home. Follow these instructions at home: Take over-the-counter and prescription medicines only as told by your health care provider. If you were given a sedative during the procedure, it can affect you for several hours. Do not drive or operate machinery until your health care provider says that it is safe. Plan to have a responsible adult care for you for the time you are told. This is important. Follow instructions from your health care provider about any eating or drinking restrictions. Do not use any products that contain  nicotine or tobacco, such as cigarettes, e-cigarettes, and chewing tobacco. If you need help quitting, ask your health care provider. Keep all follow-up visits. This is important. Contact a health care provider if: You have a fever. You have pain that is not relieved by medicine. Get help right away if: You have chest pain. You have trouble breathing. You have trouble swallowing. You vomit blood. You have black, tarry, or bloody stools. These symptoms may represent a serious problem that is an emergency. Do not wait to see if the symptoms will go away. Get medical help right away. Call your local emergency services (911 in the [place] Do not drive yourself to the hospital. Summary Esophageal dilatation, also called esophageal dilation, is a procedure to widen or open a blocked or narrowed part of the esophagus. Plan to have a responsible adult take you home from the hospital or clinic. For this procedure, a numbing medicine may be sprayed into the back of your throat, or you may gargle the medicine. Do not drive or operate machinery until your health care provider says that it is safe. This information is not intended to replace advice given to you by your health care provider. Make sure you discuss any questions you have with your health care provider. Document Revised: 12/28/2019 Document Reviewed: 12/28/2019 Elsevier Patient Education  2024 Elsevier Inc. Monitored Anesthesia Care, Care After The following information offers guidance on how to care for yourself after your procedure. Your health care provider may also give you more specific instructions. If you have problems or questions, contact your health care provider. What can I expect after the procedure? After the procedure, it is common to have: Tiredness. Little or no memory about what happened during or after the procedure. Impaired judgment when it comes to making decisions. Nausea or vomiting. Some trouble with  balance. Follow these instructions at home: For the time period you were told by your health care provider:  Rest. Do not participate in activities where you could fall or become injured. Do not drive or use machinery. Do not drink alcohol. Do not take sleeping pills or medicines that cause drowsiness. Do not make important decisions or sign legal documents. Do not take care of children on your own. Medicines Take over-the-counter and prescription medicines only as told by your health care provider. If you were prescribed antibiotics, take them as told by your health care provider. Do not stop using the antibiotic even if you start to feel better. Eating and drinking Follow instructions from your health care provider about what you may eat and drink. Drink enough fluid to keep your urine pale yellow. If you vomit: Drink clear  fluids slowly and in small amounts as you are able. Clear fluids include water, ice chips, low-calorie sports drinks, and fruit juice that has water added to it (diluted fruit juice). Eat light and bland foods in small amounts as you are able. These foods include bananas, applesauce, rice, lean meats, toast, and crackers. General instructions  Have a responsible adult stay with you for the time you are told. It is important to have someone help care for you until you are awake and alert. If you have sleep apnea, surgery and some medicines can increase your risk for breathing problems. Follow instructions from your health care provider about wearing your sleep device: When you are sleeping. This includes during daytime naps. While taking prescription pain medicines, sleeping medicines, or medicines that make you drowsy. Do not use any products that contain nicotine or tobacco. These products include cigarettes, chewing tobacco, and vaping devices, such as e-cigarettes. If you need help quitting, ask your health care provider. Contact a health care provider if: You  feel nauseous or vomit every time you eat or drink. You feel light-headed. You are still sleepy or having trouble with balance after 24 hours. You get a rash. You have a fever. You have redness or swelling around the IV site. Get help right away if: You have trouble breathing. You have new confusion after you get home. These symptoms may be an emergency. Get help right away. Call 911. Do not wait to see if the symptoms will go away. Do not drive yourself to the hospital. This information is not intended to replace advice given to you by your health care provider. Make sure you discuss any questions you have with your health care provider. Document Revised: 01/06/2022 Document Reviewed: 01/06/2022 Elsevier Patient Education  2024 [employer].

## 2023-02-03 ENCOUNTER — Encounter (HOSPITAL_COMMUNITY)
Admission: RE | Admit: 2023-02-03 | Discharge: 2023-02-03 | Disposition: A | Discharge status: Home / Self Care | Payer: Medicare Other | Source: Ambulatory Visit | Attending: Internal Medicine | Admitting: Internal Medicine

## 2023-02-03 VITALS — BP 136/78 | HR 83 | Temp 97.8°F | Resp 18 | Ht 74.0 in | Wt 268.1 lb

## 2023-02-03 DIAGNOSIS — Z01812 Encounter for preprocedural laboratory examination: Secondary | ICD-10-CM | POA: Insufficient documentation

## 2023-02-03 DIAGNOSIS — R7303 Prediabetes: Secondary | ICD-10-CM | POA: Insufficient documentation

## 2023-02-03 DIAGNOSIS — D649 Anemia, unspecified: Secondary | ICD-10-CM | POA: Insufficient documentation

## 2023-02-03 LAB — BASIC METABOLIC PANEL
Anion gap: 10 (ref 5–15)
BUN: 15 mg/dL (ref 8–23)
CO2: 21 mmol/L — ABNORMAL LOW (ref 22–32)
Calcium: 9.1 mg/dL (ref 8.9–10.3)
Chloride: 103 mmol/L (ref 98–111)
Creatinine, Ser: 0.83 mg/dL (ref 0.61–1.24)
GFR, Estimated: >60 mL/min (ref >60)
Glucose, Bld: 143 mg/dL — ABNORMAL HIGH (ref 70–99)
Potassium: 3.7 mmol/L (ref 3.5–5.1)
Sodium: 134 mmol/L — ABNORMAL LOW (ref 135–145)

## 2023-02-03 LAB — CBC WITH DIFFERENTIAL/PLATELET
Abs Immature Granulocytes: 0.02 10*3/uL (ref 0.00–0.07)
Basophils Absolute: 0 10*3/uL (ref 0.0–0.1)
Basophils Relative: 0 %
Eosinophils Absolute: 0 10*3/uL (ref 0.0–0.5)
Eosinophils Relative: 0 %
HCT: 37.3 % — ABNORMAL LOW (ref 39.0–52.0)
Hemoglobin: 12.1 g/dL — ABNORMAL LOW (ref 13.0–17.0)
Immature Granulocytes: 0 %
Lymphocytes Relative: 32 %
Lymphs Abs: 1.8 10*3/uL (ref 0.7–4.0)
MCH: 31.4 pg (ref 26.0–34.0)
MCHC: 32.4 g/dL (ref 30.0–36.0)
MCV: 96.9 fL (ref 80.0–100.0)
Monocytes Absolute: 0.6 10*3/uL (ref 0.1–1.0)
Monocytes Relative: 10 %
Neutro Abs: 3.3 10*3/uL (ref 1.7–7.7)
Neutrophils Relative %: 58 %
Platelets: 281 10*3/uL (ref 150–400)
RBC: 3.85 MIL/uL — ABNORMAL LOW (ref 4.22–5.81)
RDW: 12.1 % (ref 11.5–15.5)
WBC: 5.7 10*3/uL (ref 4.0–10.5)
nRBC: 0 % (ref 0.0–0.2)

## 2023-02-05 ENCOUNTER — Ambulatory Visit (HOSPITAL_COMMUNITY)
Admission: RE | Admit: 2023-02-05 | Discharge: 2023-02-05 | Disposition: A | Discharge status: Home / Self Care | Payer: Medicare Other | Attending: Internal Medicine | Admitting: Internal Medicine

## 2023-02-05 ENCOUNTER — Encounter (HOSPITAL_COMMUNITY)
Admission: RE | Disposition: A | Discharge status: Home / Self Care | Payer: Self-pay | Source: Home / Self Care | Attending: Internal Medicine

## 2023-02-05 ENCOUNTER — Ambulatory Visit (HOSPITAL_COMMUNITY): Payer: Medicare Other | Admitting: Anesthesiology

## 2023-02-05 ENCOUNTER — Ambulatory Visit (HOSPITAL_BASED_OUTPATIENT_CLINIC_OR_DEPARTMENT_OTHER): Payer: Medicare Other | Admitting: Anesthesiology

## 2023-02-05 DIAGNOSIS — E119 Type 2 diabetes mellitus without complications: Secondary | ICD-10-CM | POA: Insufficient documentation

## 2023-02-05 DIAGNOSIS — R1310 Dysphagia, unspecified: Secondary | ICD-10-CM | POA: Insufficient documentation

## 2023-02-05 DIAGNOSIS — K449 Diaphragmatic hernia without obstruction or gangrene: Secondary | ICD-10-CM

## 2023-02-05 DIAGNOSIS — Z87891 Personal history of nicotine dependence: Secondary | ICD-10-CM | POA: Diagnosis not present

## 2023-02-05 DIAGNOSIS — Z7984 Long term (current) use of oral hypoglycemic drugs: Secondary | ICD-10-CM | POA: Diagnosis not present

## 2023-02-05 DIAGNOSIS — F419 Anxiety disorder, unspecified: Secondary | ICD-10-CM | POA: Diagnosis not present

## 2023-02-05 DIAGNOSIS — K222 Esophageal obstruction: Secondary | ICD-10-CM | POA: Insufficient documentation

## 2023-02-05 DIAGNOSIS — I10 Essential (primary) hypertension: Secondary | ICD-10-CM

## 2023-02-05 DIAGNOSIS — K219 Gastro-esophageal reflux disease without esophagitis: Secondary | ICD-10-CM | POA: Diagnosis not present

## 2023-02-05 DIAGNOSIS — M1990 Unspecified osteoarthritis, unspecified site: Secondary | ICD-10-CM | POA: Diagnosis not present

## 2023-02-05 DIAGNOSIS — Z8546 Personal history of malignant neoplasm of prostate: Secondary | ICD-10-CM | POA: Insufficient documentation

## 2023-02-05 DIAGNOSIS — G4730 Sleep apnea, unspecified: Secondary | ICD-10-CM | POA: Diagnosis not present

## 2023-02-05 DIAGNOSIS — Z79899 Other long term (current) drug therapy: Secondary | ICD-10-CM | POA: Insufficient documentation

## 2023-02-05 DIAGNOSIS — F32A Depression, unspecified: Secondary | ICD-10-CM | POA: Insufficient documentation

## 2023-02-05 DIAGNOSIS — F418 Other specified anxiety disorders: Secondary | ICD-10-CM

## 2023-02-05 DIAGNOSIS — R1319 Other dysphagia: Secondary | ICD-10-CM

## 2023-02-05 DIAGNOSIS — J449 Chronic obstructive pulmonary disease, unspecified: Secondary | ICD-10-CM

## 2023-02-05 HISTORY — PX: ESOPHAGOGASTRODUODENOSCOPY (EGD) WITH PROPOFOL: SHX5813

## 2023-02-05 HISTORY — PX: MALONEY DILATION: SHX5535

## 2023-02-05 SURGERY — ESOPHAGOGASTRODUODENOSCOPY (EGD) WITH PROPOFOL
Anesthesia: General

## 2023-02-05 MED ORDER — PHENYLEPHRINE HCL (PRESSORS) 10 MG/ML IV SOLN
INTRAVENOUS | Status: DC | PRN
  Administered 2023-02-05: 80 ug via INTRAVENOUS

## 2023-02-05 MED ORDER — PROPOFOL 500 MG/50ML IV EMUL
INTRAVENOUS | Status: DC | PRN
  Administered 2023-02-05: 125 ug/kg/min via INTRAVENOUS

## 2023-02-05 MED ORDER — PROPOFOL 10 MG/ML IV BOLUS
INTRAVENOUS | Status: DC | PRN
  Administered 2023-02-05: 80 mg via INTRAVENOUS

## 2023-02-05 MED ORDER — IPRATROPIUM-ALBUTEROL 0.5-2.5 (3) MG/3ML IN SOLN
RESPIRATORY_TRACT | Status: AC
  Filled 2023-02-05: qty 3

## 2023-02-05 MED ORDER — LACTATED RINGERS IV SOLN: INTRAVENOUS | Status: DC

## 2023-02-05 MED ORDER — PHENYLEPHRINE 80 MCG/ML (10ML) SYRINGE FOR IV PUSH (FOR BLOOD PRESSURE SUPPORT)
PREFILLED_SYRINGE | INTRAVENOUS | Status: AC
  Filled 2023-02-05: qty 10

## 2023-02-05 MED ORDER — LIDOCAINE HCL (CARDIAC) PF 100 MG/5ML IV SOSY
PREFILLED_SYRINGE | INTRAVENOUS | Status: DC | PRN
  Administered 2023-02-05: 60 mg via INTRAVENOUS

## 2023-02-05 MED ORDER — PROPOFOL 500 MG/50ML IV EMUL
INTRAVENOUS | Status: AC
  Filled 2023-02-05: qty 50

## 2023-02-05 MED ORDER — IPRATROPIUM-ALBUTEROL 0.5-2.5 (3) MG/3ML IN SOLN
3.0000 mL | Freq: Once | RESPIRATORY_TRACT | Status: AC
  Administered 2023-02-05: 3 mL via RESPIRATORY_TRACT

## 2023-02-05 MED ORDER — LIDOCAINE HCL (PF) 2 % IJ SOLN
INTRAMUSCULAR | Status: AC
  Filled 2023-02-05: qty 5

## 2023-02-05 NOTE — H&P (Signed)
@LOGO @   Primary Care Physician:  [name], MD Primary Gastroenterologist:  Dr. Jena Gauss  Pre-Procedure History & Physical: HPI:  [name] is a 67 y.o. male here for further evaluation of dysphagia.  History of Schatzki's ring 2019 dilated.  Past Medical History:  Diagnosis Date   Anemia    Anxiety    Arthritis    Bladder stone    Cancer (HCC) 2007   prostate CA   COPD (chronic obstructive pulmonary disease) (HCC)    Decreased pulse 08/16/2008   LE doppler - bilateral ABIs normal, bilateral PVRs normal waveform; bilateral common iliac arteries difficult to visualize however velocities suggest less than 50% diameter reduction; bilateral LE normal velocities w/o evidence of diameter reduction   Depression    GERD (gastroesophageal reflux disease)    Hypercholesteremia    Hypertension    Mental disorder    PTSD   Pre-diabetes    Shortness of breath    Sleep apnea    wears CPAP nightly, settings at 14    Tobacco abuse    Urinary incontinence    due to prostate removal    Past Surgical History:  Procedure Laterality Date   ANTERIOR LAT LUMBAR FUSION N/A 11/17/2012   Procedure: XLIF L3 - L4 POSTERIOR L3 -L4 FUSION INSTRUMENTATION/L2 - L3 DECOMPRESSION (REVISION OF POSTERIOR HARDWARE) 1 LEVEL;  Surgeon: Venita Lick, MD;  Location: [facility] OR;  Service: Orthopedics;  Laterality: N/A;  procedure #1: start 0923, end 1057.    BACK SURGERY  2010 / 2014   BLADDER SUSPENSION     x 2   CARDIAC CATHETERIZATION     no PCI   CARDIOVASCULAR STRESS TEST  08/16/2008   EF 57%; inferior region has diaphragmatic attenuation, otherwise normal perfusion w/o evidence of ischemia or infarct; LV normal in size; no scintographic evidence of inducible ischemia; no ECG changes, negative for ischemia; low risk scan   CERVICAL FUSION     COLON RESECTION N/A 07/12/2013   peritoneal biopsy only, evidence of metastatic prostate cancer   COLONOSCOPY     COLONOSCOPY N/A 05/27/2013   RMR: Extrinsic  mass effect at the level of the appendiceal orifice likely represents an appendiceal or periappendiceal process. Neovascular changes of the rectal mucosa consistent with prior history of radiation treatment. Multiple 3-5 mm polyps removed, several tubular adenomas. Diverticulosis.nex tcs 2019   COLONOSCOPY WITH PROPOFOL N/A 07/05/2018   Procedure: COLONOSCOPY WITH PROPOFOL;  Surgeon: Corbin Ade, MD;  Location: [facility];  Service: Endoscopy;  Laterality: N/A;  9:30am   COLONOSCOPY WITH PROPOFOL N/A 03/07/2021   Procedure: COLONOSCOPY WITH PROPOFOL;  Surgeon: Corbin Ade, MD;  Location: [facility];  Service: Endoscopy;  Laterality: N/A;  2:30pm   CYSTOSCOPY W/ URETERAL STENT PLACEMENT N/A 10/03/2013   Procedure: CYSTOSCOPY AND REMOVAL OF BLADDER NECK STONE ;  Surgeon: Crecencio Mc, MD;  Location: [facility] OR[facility];  Service: Urology;  Laterality: N/A;  WITH REMOVAL OF BLADDER NECK STONE     ESOPHAGOGASTRODUODENOSCOPY (EGD) WITH ESOPHAGEAL DILATION N/A 05/27/2013   RMR: Mild erosive reflux esophagitis with soft noncritical appearing stricture status post Maloney dilation, antral erosions with reactive gastropathy but no H. pylori   ESOPHAGOGASTRODUODENOSCOPY (EGD) WITH PROPOFOL N/A 07/05/2018   Procedure: ESOPHAGOGASTRODUODENOSCOPY (EGD) WITH PROPOFOL;  Surgeon: Corbin Ade, MD;  Location: [facility];  Service: Endoscopy;  Laterality: N/A;   HOLMIUM LASER APPLICATION N/A 10/03/2013   Procedure: HOLMIUM LASER APPLICATION;  Surgeon: Crecencio Mc, MD;  Location: [facility];  Service: Urology;  Laterality: N/A;   MALONEY DILATION N/A 07/05/2018   Procedure: Elease Hashimoto DILATION;  Surgeon: Corbin Ade, MD;  Location: [facility];  Service: Endoscopy;  Laterality: N/A;   POLYPECTOMY  07/05/2018   Procedure: POLYPECTOMY;  Surgeon: Corbin Ade, MD;  Location: [facility];  Service: Endoscopy;;  cecal polyp , splenic flexure polyps x4   POLYPECTOMY  03/07/2021   Procedure: POLYPECTOMY;  Surgeon: Corbin Ade, MD;  Location: [facility];  Service: Endoscopy;;   PROSTATE SURGERY     TRANSTHORACIC ECHOCARDIOGRAM  08/16/2008   essentially normal for age   URINARY SPHINCTER IMPLANT N/A 01/31/2014   Procedure: IMPLANTATION ARTIFICIAL SPHINCTER  WITH CYSTOSCOPY;  Surgeon: Martina Sinner, MD;  Location: [facility];  Service: Urology;  Laterality: N/A;   WRIST SURGERY Right     Prior to Admission medications   Medication Sig Start Date End Date Taking? Authorizing Provider  ARIPiprazole (ABILIFY) 10 MG tablet Take 10 mg by mouth at bedtime. 04/13/18  Yes [provider]  Choline Fenofibrate 135 MG capsule Take 135 mg by mouth at bedtime. trilipix   Yes [provider]  Coenzyme Q10 (CO Q 10) 100 MG CAPS Take 100 mg by mouth every evening.   Yes [provider]  desipramine (NOPRAMIN) 100 MG tablet Take 100 mg by mouth at bedtime.  09/04/14  Yes [provider]  enalapril (VASOTEC) 10 MG tablet Take 10 mg by mouth every morning.    Yes [provider]  enzalutamide (XTANDI) 40 MG capsule Take 160 mg by mouth daily. Takes 4 capsules daily   Yes [provider]  ferrous sulfate 325 (65 FE) MG tablet Take 325 mg by mouth daily.  06/30/14  Yes [provider]  hydroxychloroquine (PLAQUENIL) 200 MG tablet Take 200 mg by mouth 2 (two) times daily.   Yes [provider]  Boris Lown Oil 300 MG CAPS Take 300 mg by mouth daily.   Yes [provider]  meloxicam (MOBIC) 7.5 MG tablet Take 7.5 mg by mouth 2 (two) times daily. 04/18/19  Yes [provider]  Multiple Vitamin (MULTIVITAMIN WITH MINERALS) TABS tablet Take 1 tablet by mouth daily.   Yes [provider]  pantoprazole (PROTONIX) 40 MG tablet Take 1 tablet (40 mg total) by mouth daily. 30 minutes before breakfast 12/29/22  Yes Mahon, Courtney L, NP  rosuvastatin (CRESTOR) 40 MG tablet TAKE 1 TABLET DAILY 01/30/23  Yes Croitoru, Mihai, MD  venlafaxine XR (EFFEXOR-XR)  75 MG 24 hr capsule Take 225 mg by mouth daily. 10/03/14  Yes [provider]    Allergies as of 12/30/2022 - Review Complete 12/29/2022  Allergen Reaction Noted   Penicillins Anaphylaxis 09/30/2011    Family History  Problem Relation Age of Onset   Diabetes Mother    Hypertension Mother    Heart disease Mother    Diabetes Father    Hypertension Father    Colon polyps Father        age 66   Hypertension Brother    Diabetes Brother    Colon cancer Neg Hx     Social History   Socioeconomic History   Marital status: Married    Spouse name: Not on file   Number of children: Not on file   Years of education: Not on file   Highest education level: Not on file  Occupational History   Occupation: driving truck    Comment: retired  Tobacco Use  Smoking status: Former    Packs/day: 0.25    Years: 43.00    Additional pack years: 0.00    Total pack years: 10.75    Types: Cigarettes    Quit date: 02/28/2013    Years since quitting: 9.9   Smokeless tobacco: Former    Quit date: 06/30/1980  Vaping Use   Vaping Use: Never used  Substance and Sexual Activity   Alcohol use: No   Drug use: No   Sexual activity: Yes    Birth control/protection: None  Other Topics Concern   Not on file  Social History Narrative   Not on file   Social Determinants of Health   Financial Resource Strain: Not on file  Food Insecurity: Not on file  Transportation Needs: Not on file  Physical Activity: Not on file  Stress: Not on file  Social Connections: Not on file  Intimate Partner Violence: Not on file    Review of Systems: See HPI, otherwise negative ROS  Physical Exam: BP 112/87   Pulse 76   Temp 98.2 F (36.8 C)   Resp 18   SpO2 100%  General:   Alert,  Well-developed, well-nourished, pleasant and cooperative in NAD Mouth:  No deformity or lesions. Neck:  Supple; no masses or thyromegaly. No significant cervical adenopathy. Lungs:  Clear throughout to auscultation.    No wheezes, crackles, or rhonchi. No acute distress. Heart:  Regular rate and rhythm; no murmurs, clicks, rubs,  or gallops. Abdomen: Non-distended, normal bowel sounds.  Soft and nontender without appreciable mass or hepatosplenomegaly.   Impression/Plan: 67 year old gentleman with known Schatzki's ring presents with recurrent esophageal dysphagia.  He had a nice response to dilation previously.  I have offered the patient an EGD with esophageal dilation as feasible/appropriate today per plan.  The risks, benefits, limitations, alternatives and imponderables have been reviewed with the patient. Potential for esophageal dilation, biopsy, etc. have also been reviewed.  Questions have been answered. All parties agreeable.      Notice: This dictation was prepared with Dragon dictation along with smaller phrase technology. Any transcriptional errors that result from this process are unintentional and may not be corrected upon review.

## 2023-02-05 NOTE — Transfer of Care (Addendum)
Immediate Anesthesia Transfer of Care Note  Patient: [name]  Procedure(s) Performed: ESOPHAGOGASTRODUODENOSCOPY (EGD) WITH PROPOFOL MALONEY DILATION  Patient Location: PACU and Short Stay  Anesthesia Type:General  Level of Consciousness: awake and patient cooperative  Airway & Oxygen Therapy: Patient Spontanous Breathing  Post-op Assessment: Report given to RN and Post -op Vital signs reviewed and stable  Post vital signs: Reviewed and stable  Last Vitals:  Vitals Value Taken Time  BP  02/05/2023      Temp 36.4 02/05/2023    1041  Pulse 78 02/05/2023    1041  Resp 17 02/05/2023    1041  SpO2 100% 02/05/2023    1041    Last Pain:  Vitals:   02/05/23 1020  PainSc: 3          Complications: No notable events documented.

## 2023-02-05 NOTE — Anesthesia Preprocedure Evaluation (Addendum)
Anesthesia Evaluation  Patient identified by MRN, date of birth, ID band Patient awake    Reviewed: Allergy & Precautions, H&P , NPO status , Patient's Chart, lab work & pertinent test results  Airway Mallampati: II  TM Distance: >3 FB Neck ROM: Full   Comment: Cervical fusion Dental  (+) Edentulous Upper, Edentulous Lower   Pulmonary shortness of breath and with exertion, sleep apnea and Continuous Positive Airway Pressure Ventilation , COPD, former smoker    + wheezing      Cardiovascular METS: 3 - Mets hypertension, Pt. on medications + DOE  Normal cardiovascular exam Rhythm:Regular Rate:Normal     Neuro/Psych  PSYCHIATRIC DISORDERS Anxiety Depression    negative neurological ROS     GI/Hepatic Neg liver ROS,GERD  Medicated and Controlled,,  Endo/Other  diabetes, Well Controlled, Type 2, Oral Hypoglycemic Agents    Renal/GU negative Renal ROS Bladder dysfunction (prostate cancer)      Musculoskeletal  (+) Arthritis , Osteoarthritis,    Abdominal   Peds negative pediatric ROS (+)  Hematology  (+) Blood dyscrasia, anemia   Anesthesia Other Findings   Reproductive/Obstetrics negative OB ROS                             Anesthesia Physical Anesthesia Plan  ASA: 3  Anesthesia Plan: General   Post-op Pain Management: Minimal or no pain anticipated   Induction: Intravenous  PONV Risk Score and Plan: Propofol infusion  Airway Management Planned: Nasal Cannula and Natural Airway  Additional Equipment:   Intra-op Plan:   Post-operative Plan:   Informed Consent: I have reviewed the patients History and Physical, chart, labs and discussed the procedure including the risks, benefits and alternatives for the proposed anesthesia with the patient or authorized representative who has indicated his/her understanding and acceptance.     Dental advisory given  Plan Discussed with: CRNA  and Surgeon  Anesthesia Plan Comments: (Mild wheezing, as per patient, wheezes occasionally and he doesn't take any meds,  spo2 100%, will give duoneb nebulizer treatment before the procedure)        Anesthesia Quick Evaluation

## 2023-02-05 NOTE — Op Note (Signed)
[facility] Patient [name] Procedure Date: 02/05/2023 10:12 AM MRN: [id number] Date of Birth: 18-Jul-1956 Attending MD: Gennette Pac , MD, [id number] CSN: [id number] Age: 67 Admit Type: Outpatient Procedure:                Upper GI endoscopy Indications:              Dysphagia Providers:                Gennette Pac, MD, Sheran Fava,                            Kristine L. Jessee Avers, Technician, Zena Amos Referring MD:              Medicines:                Propofol per Anesthesia Complications:            No immediate complications. Estimated Blood Loss:     Estimated blood loss was minimal. Estimated blood                            loss was minimal. Procedure:                Pre-Anesthesia Assessment:                           - Prior to the procedure, a History and Physical                            was performed, and patient medications and                            allergies were reviewed. The patient's tolerance of                            previous anesthesia was also reviewed. The risks                            and benefits of the procedure and the sedation                            options and risks were discussed with the patient.                            All questions were answered, and informed consent                            was obtained. Prior Anticoagulants: The patient has                            taken no anticoagulant or antiplatelet agents. ASA                            Grade Assessment: III - A patient with severe  systemic disease. After reviewing the risks and                            benefits, the patient was deemed in satisfactory                            condition to undergo the procedure.                           After obtaining informed consent, the endoscope was                            passed under direct vision. Throughout the                            procedure,  the patient's blood pressure, pulse, and                            oxygen saturations were monitored continuously. The                            [id number]-[id number] ([id number]) scope was introduced through the                            mouth, and advanced to the second part of duodenum.                            The upper GI endoscopy was accomplished without                            difficulty. The patient tolerated the procedure                            well. Scope In: 10:23:46 AM Scope Out: 10:32:20 AM Total Procedure Duration: 0 hours 8 minutes 34 seconds  Findings:      A mild Schatzki ring was found at the gastroesophageal junction. No       esophagitis. No nodularity. No Barrett's epithelium. Stomach empty. and       showed moderate mucosal disruption. Estimated blood loss was minimal.      A small hiatal hernia was present. Gastric mucosa appeared normal.       Patent pylorus.      The duodenal bulb and second portion of the duodenum were normal. The       scope was withdrawn. Dilation was performed with a Maloney dilator with       no resistance at 54 Fr. The dilation site was examined following       endoscope reinsertion and showed no change. Estimated blood loss: none.       The scope was withdrawn. Dilation was performed with a Maloney dilator       with no resistance at 58 Fr. The dilation site was examined following       endoscope reinsertion and showed moderate mucosal disruption. Estimated       blood loss was minimal. Impression:               -  Mild Schatzki ring. Dilated.                           - Small hiatal hernia. Otherwise, normal stomach.                           - Normal duodenal bulb and second portion of the                            duodenum.                           - No specimens collected. Moderate Sedation:      Moderate (conscious) sedation was personally administered by an       anesthesia professional. The following parameters were monitored:  oxygen       saturation, heart rate, blood pressure, respiratory rate, EKG, adequacy       of pulmonary ventilation, and response to care. Recommendation:           - Patient has a contact number available for                            emergencies. The signs and symptoms of potential                            delayed complications were discussed with the                            patient. Return to normal activities tomorrow.                            Written discharge instructions were provided to the                            patient.                           - Advance diet as tolerated.                           - Continue present medications.                           - Return to my office in 1 year. Procedure Code(s):        --- Professional ---                           [phone], Esophagogastroduodenoscopy, flexible,                            transoral; diagnostic, including collection of                            specimen(s) by brushing or washing, when performed                            (separate procedure)  43450, Dilation of esophagus, by unguided sound or                            bougie, single or multiple passes Diagnosis Code(s):        --- Professional ---                           K22.2, Esophageal obstruction                           K44.9, Diaphragmatic hernia without obstruction or                            gangrene                           R13.10, Dysphagia, unspecified CPT copyright 2022 [employer]. All rights reserved. The codes documented in this report are preliminary and upon coder review may  be revised to meet current compliance requirements. [name]. [name], MD Gennette Pac, MD 02/05/2023 10:43:38 AM This report has been signed electronically. Number of Addenda: 0

## 2023-02-05 NOTE — Anesthesia Postprocedure Evaluation (Signed)
Anesthesia Post Note  Patient: [name]  Procedure(s) Performed: ESOPHAGOGASTRODUODENOSCOPY (EGD) WITH PROPOFOL MALONEY DILATION  Patient location during evaluation: Phase II Anesthesia Type: General Level of consciousness: awake and alert and oriented Pain management: pain level controlled Vital Signs Assessment: post-procedure vital signs reviewed and stable Respiratory status: spontaneous breathing, nonlabored ventilation and respiratory function stable Cardiovascular status: blood pressure returned to baseline and stable Postop Assessment: no apparent nausea or vomiting Anesthetic complications: no  No notable events documented.   Last Vitals:  Vitals:   02/05/23 0945 02/05/23 1041  BP:    Pulse: 76 78  Resp: 18 17  Temp:  36.4 C  SpO2: 100% 100%    Last Pain:  Vitals:   02/05/23 1041  TempSrc: Axillary  PainSc: 0-No pain                 [name] C Migdalia Olejniczak

## 2023-02-05 NOTE — Discharge Instructions (Addendum)
EGD Discharge instructions Please read the instructions outlined below and refer to this sheet in the next few weeks. These discharge instructions provide you with general information on caring for yourself after you leave the hospital. Your doctor may also give you specific instructions. While your treatment has been planned according to the most current medical practices available, unavoidable complications occasionally occur. If you have any problems or questions after discharge, please call your doctor. ACTIVITY You may resume your regular activity but move at a slower pace for the next 24 hours.  Take frequent rest periods for the next 24 hours.  Walking will help expel (get rid of) the air and reduce the bloated feeling in your abdomen.  No driving for 24 hours (because of the anesthesia (medicine) used during the test).  You may shower.  Do not sign any important legal documents or operate any machinery for 24 hours (because of the anesthesia used during the test).  NUTRITION Drink plenty of fluids.  You may resume your normal diet.  Begin with a light meal and progress to your normal diet.  Avoid alcoholic beverages for 24 hours or as instructed by your caregiver.  MEDICATIONS You may resume your normal medications unless your caregiver tells you otherwise.  WHAT YOU CAN EXPECT TODAY You may experience abdominal discomfort such as a feeling of fullness or "gas" pains.  FOLLOW-UP Your doctor will discuss the results of your test with you.  SEEK IMMEDIATE MEDICAL ATTENTION IF ANY OF THE FOLLOWING OCCUR: Excessive nausea (feeling sick to your stomach) and/or vomiting.  Severe abdominal pain and distention (swelling).  Trouble swallowing.  Temperature over 101 F (37.8 C).  Rectal bleeding or vomiting of blood.     Your esophagus was nicely dilated again today.  Plan for an office visit in 1 year  If any interim problems please do not hesitate to call  At patient request,  called Hortencia Conradi at [phone] -reviewed findings and recommendations

## 2023-02-11 ENCOUNTER — Encounter (HOSPITAL_COMMUNITY): Payer: Self-pay | Admitting: Internal Medicine

## 2023-04-21 ENCOUNTER — Encounter: Payer: Self-pay | Admitting: Internal Medicine

## 2023-04-29 DIAGNOSIS — C61 Malignant neoplasm of prostate: Secondary | ICD-10-CM | POA: Insufficient documentation

## 2023-04-29 DIAGNOSIS — C7951 Secondary malignant neoplasm of bone: Secondary | ICD-10-CM | POA: Insufficient documentation

## 2023-04-29 NOTE — Progress Notes (Addendum)
[facility] CONSULT NOTE  Patient Care Team: [name], MD as PCP - General (Internal Medicine) Thurmon Fair, MD as PCP - Cardiology (Cardiology) Lennette Bihari, MD as PCP - Sleep Medicine (Cardiology) [name], RN as Oncology Nurse Navigator  Addendum Outside records reviewed.  New testing showed negative for HRR mutation from [facility].   ASSESSMENT & PLAN:  Prostate cancer metastatic to bone [facility]) [name] is a very pleasant 67 year old male with history of hypertension, hyperlipidemia, COPD, prostate cancer came in with his wife today.  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 We discussed the management of M1 CRPC. [name] is progressing from novel hormone therapy. His PSADT was about 17 months back around the Spring time. There are several other options we can still explore. Pending additional testing, [name] has potential options are PARPi, chemotherapy, immunotherapy, Radium-223. If not eligible for PARPi, then pending his disease burden may consider chemotherapy if large disease volume vs Radium-223 in bone only disease. One may consider SBRT if low volume disease with few sites of oligometastases while continue ARPI with ADT. Lu-177 is approved post-taxane chemotherapy and can be considered in the future as well. I reviewed NCCN guideline with him and his wife today.   I recommend obtain repeat checking PSMA PET, PSA. Recommend attempt liquid biopsy for somatic mutations to identify somatic HRR mutation and referral for germline genetic testing. If additional imaging and testing concern for potential aggressive disease, consider biopsy to ensure no signs of high grade transformation.  He will return to see me to discuss the test results when they are available in 2 to 3 weeks.  I also connect him with our nurse navigator Marisue Ivan today who graciously took him down to our genetic counselor for appointment.  Order PSMA PET  CMP, CBC, PSA, testosterone, LDH with genetic  testings Referral for genetic testing for HRR mutation and MMR. [name] graciously open up an appointment to see him today. Somatic HRR testing send on blood today    At risk for side effect of medication Bone health Ordered bone density scan.  Taking vitamin D 1000 - 2000 IUs and calcium supplement 1000 mg-1200 mg daily Zometa can be considered after dental clearance.  Recommend routine dental care Aggressive cardiovascular risk management to control diabetes, hypertension and heart disease. He will continue his medications through PCP.   Normocytic anemia He is taking iron daily. Will check folate and b12 and replace if needed.   Orders Placed This Encounter  Procedures   NM PET (PSMA) SKULL TO MID THIGH    Standing Status:   Future    Standing Expiration Date:   04/28/2024    Order Specific Question:   If indicated for the ordered procedure, I authorize the administration of a radiopharmaceutical per Radiology protocol    Answer:   Yes    Order Specific Question:   Preferred imaging location?    Answer:   [facility]   DG Bone Density    Standing Status:   Future    Standing Expiration Date:   04/29/2024    Order Specific Question:   Reason for Exam (SYMPTOM  OR DIAGNOSIS REQUIRED)    Answer:   assess change in bone density and if there is osteopenia with ADT    Order Specific Question:   Preferred imaging location?    Answer:   [facility]   CBC with Differential/Platelet    Standing Status:   Standing    Number of Occurrences:  22    Standing Expiration Date:   04/29/2024   CMP (Cancer Center only)    Standing Status:   Future    Number of Occurrences:   1    Standing Expiration Date:   04/29/2024   Lactate dehydrogenase    Standing Status:   Future    Number of Occurrences:   1    Standing Expiration Date:   04/29/2024   PSA, total and free    Standing Status:   Future    Number of Occurrences:   1    Standing Expiration Date:   04/29/2024   Testosterone     Standing Status:   Future    Number of Occurrences:   1    Standing Expiration Date:   04/29/2024   Ambulatory referral to Genetics    Referral Priority:   Routine    Referral Type:   Consultation    Referral Reason:   Specialty Services Required    Number of Visits Requested:   1    Patient will return in 2-3 weeks after above testing.  All questions were answered. The patient knows to call the clinic with any problems, questions or concerns. No barriers to learning was detected.  [name], MD 9/5/202412:33 PM  CHIEF COMPLAINTS/PURPOSE OF CONSULTATION:  mCRPC with progression  HISTORY OF PRESENTING ILLNESS:  [name] 67 y.o. male is here because of prostate cancer. I have reviewed his chart and materials related to his cancer extensively and collaborated history with the patient. Summary of oncologic history is as follows: Oncology History  Prostate cancer metastatic to bone [facility])  2000 Initial Diagnosis   Initial diagnosis in [place], [place]   11/2005 Surgery   Robotic radical prostatectomy by Dr. Gaynelle Arabian. pT3a N0.  Positive margin Gleason 3+4 = 7 adenocarcinoma   07/2008 Progression   PSA 0.11. salvage radiation.  Completed in May 2010. (Dr. Kathrynn Running)   2014 Progression   Found in the appendiceal mass undergoing laparoscopy by general surgery.  Pathology showed solitary intraperitoneal metastases from prostate cancer   07/2013 - 04/2017 Chemotherapy   Started intermittent ADT   08/2018 Progression   PSA increased to 6.22 eventually. CT showed multiple sclerotic bone lesions in the spine, sacrum, pelvis and bone scan with uptake in the right ischium and multiple ribs.   08/2018 -  Chemotherapy   He started enzalutamide. PSA reached nadir <0.2.   03/15/2020 Imaging   Bone scan 1. Resolution of focal radiotracer activity in the right anterior fourth rib and left sacral ala since prior study. 2. Persistent asymmetric activity involving the distal clavicle right  greater than left, nonspecific. While metastatic disease cannot be excluded, degenerative changes could also give this pattern. 3. Degenerative type activity of the bilateral glenohumeral joints, cervical spine, and lumbar spine   04/05/2021 Tumor Marker   PSA 0.11   07/17/2021 Tumor Marker   PSA 0.19 Testosterone 13.3   2023 Progression   Rising PSA   09/20/2021 Tumor Marker   PSA 0.26 Testosterone 20.9   12/16/2021 Tumor Marker   PSA 0.57 Testosterone 25.3   03/26/2022 Tumor Marker   PSA 1.17 Testosterone 14.1   06/2022 Imaging   PSMA PET: Positive abdominal lymph nodes mildly improved compared to prior conventional imaging 2020.   06/25/2022 Tumor Marker   PSA 1.36 Testosterone 13.9   10/01/2022 Tumor Marker   PSA 3.7 Testosterone 16.1   01/05/2023 Tumor Marker   PSA 4.2 Testosterone 17.6  03/2023 Tumor Marker   PSA 7.18   04/29/2023 Cancer Staging   Staging form: Prostate, AJCC 8th Edition - Clinical: Stage IVB (cT3a, cN0, cM1b, Grade Group: 2) - Signed by [name], MD on 04/29/2023 Gleason score: 7 Histologic grading system: 5 grade system     MEDICAL HISTORY:  Past Medical History:  Diagnosis Date   Anemia    Anxiety    Arthritis    Bladder stone    Cancer (HCC) 2007   prostate CA   COPD (chronic obstructive pulmonary disease) (HCC)    Decreased pulse 08/16/2008   LE doppler - bilateral ABIs normal, bilateral PVRs normal waveform; bilateral common iliac arteries difficult to visualize however velocities suggest less than 50% diameter reduction; bilateral LE normal velocities w/o evidence of diameter reduction   Depression    GERD (gastroesophageal reflux disease)    Hypercholesteremia    Hypertension    Mental disorder    PTSD   Pre-diabetes    Shortness of breath    Sleep apnea    wears CPAP nightly, settings at 14    Tobacco abuse    Urinary incontinence    due to prostate removal    SURGICAL HISTORY: Past Surgical History:   Procedure Laterality Date   ANTERIOR LAT LUMBAR FUSION N/A 11/17/2012   Procedure: XLIF L3 - L4 POSTERIOR L3 -L4 FUSION INSTRUMENTATION/L2 - L3 DECOMPRESSION (REVISION OF POSTERIOR HARDWARE) 1 LEVEL;  Surgeon: Venita Lick, MD;  Location: [facility] OR;  Service: Orthopedics;  Laterality: N/A;  procedure #1: start 0923, end 1057.    BACK SURGERY  2010 / 2014   BLADDER SUSPENSION     x 2   CARDIAC CATHETERIZATION     no PCI   CARDIOVASCULAR STRESS TEST  08/16/2008   EF 57%; inferior region has diaphragmatic attenuation, otherwise normal perfusion w/o evidence of ischemia or infarct; LV normal in size; no scintographic evidence of inducible ischemia; no ECG changes, negative for ischemia; low risk scan   CERVICAL FUSION     COLON RESECTION N/A 07/12/2013   peritoneal biopsy only, evidence of metastatic prostate cancer   COLONOSCOPY     COLONOSCOPY N/A 05/27/2013   RMR: Extrinsic mass effect at the level of the appendiceal orifice likely represents an appendiceal or periappendiceal process. Neovascular changes of the rectal mucosa consistent with prior history of radiation treatment. Multiple 3-5 mm polyps removed, several tubular adenomas. Diverticulosis.nex tcs 2019   COLONOSCOPY WITH PROPOFOL N/A 07/05/2018   Procedure: COLONOSCOPY WITH PROPOFOL;  Surgeon: Corbin Ade, MD;  Location: [facility];  Service: Endoscopy;  Laterality: N/A;  9:30am   COLONOSCOPY WITH PROPOFOL N/A 03/07/2021   Procedure: COLONOSCOPY WITH PROPOFOL;  Surgeon: Corbin Ade, MD;  Location: [facility];  Service: Endoscopy;  Laterality: N/A;  2:30pm   CYSTOSCOPY W/ URETERAL STENT PLACEMENT N/A 10/03/2013   Procedure: CYSTOSCOPY AND REMOVAL OF BLADDER NECK STONE ;  Surgeon: Crecencio Mc, MD;  Location: [facility] ORS;  Service: Urology;  Laterality: N/A;  WITH REMOVAL OF BLADDER NECK STONE     ESOPHAGOGASTRODUODENOSCOPY (EGD) WITH ESOPHAGEAL DILATION N/A 05/27/2013   RMR: Mild erosive reflux esophagitis with soft noncritical  appearing stricture status post Maloney dilation, antral erosions with reactive gastropathy but no H. pylori   ESOPHAGOGASTRODUODENOSCOPY (EGD) WITH PROPOFOL N/A 07/05/2018   Procedure: ESOPHAGOGASTRODUODENOSCOPY (EGD) WITH PROPOFOL;  Surgeon: Corbin Ade, MD;  [facility];  Service: Endoscopy;  Laterality: N/A;   ESOPHAGOGASTRODUODENOSCOPY (EGD) WITH PROPOFOL N/A 02/05/2023  Procedure: ESOPHAGOGASTRODUODENOSCOPY (EGD) WITH PROPOFOL;  Surgeon: Corbin Ade, MD;  [facility];  Service: Endoscopy;  Laterality: N/A;  11:30 am, asa 3   HOLMIUM LASER APPLICATION N/A 10/03/2013   Procedure: HOLMIUM LASER APPLICATION;  Surgeon: Crecencio Mc, MD;  Location: [facility];  Service: Urology;  Laterality: N/A;   MALONEY DILATION N/A 07/05/2018   Procedure: Elease Hashimoto DILATION;  Surgeon: Corbin Ade, MD;  [facility];  Service: Endoscopy;  Laterality: N/A;   MALONEY DILATION N/A 02/05/2023   Procedure: Elease Hashimoto DILATION;  Surgeon: Corbin Ade, MD;  [facility];  Service: Endoscopy;  Laterality: N/A;   POLYPECTOMY  07/05/2018   Procedure: POLYPECTOMY;  Surgeon: Corbin Ade, MD;  [facility];  Service: Endoscopy;;  cecal polyp , splenic flexure polyps x4   POLYPECTOMY  03/07/2021   Procedure: POLYPECTOMY;  Surgeon: Corbin Ade, MD;  [facility];  Service: Endoscopy;;   PROSTATE SURGERY     TRANSTHORACIC ECHOCARDIOGRAM  08/16/2008   essentially normal for age   URINARY SPHINCTER IMPLANT N/A 01/31/2014   Procedure: IMPLANTATION ARTIFICIAL SPHINCTER  WITH CYSTOSCOPY;  Surgeon: Martina Sinner, MD;  Location: [facility];  Service: Urology;  Laterality: N/A;   WRIST SURGERY Right     SOCIAL HISTORY: Social History   Socioeconomic History   Marital status: Married    Spouse name: Not on file   Number of children: Not on file   Years of education: Not on file   Highest education level: Not on file  Occupational History    Occupation: driving truck    Comment: retired  Tobacco Use   Smoking status: Former    Current packs/day: 0.00    Average packs/day: 0.3 packs/day for 43.0 years (10.8 ttl pk-yrs)    Types: Cigarettes    Start date: 02/28/1970    Quit date: 02/28/2013    Years since quitting: 10.1   Smokeless tobacco: Former    Quit date: 06/30/1980  Vaping Use   Vaping status: Never Used  Substance and Sexual Activity   Alcohol use: No   Drug use: No   Sexual activity: Yes    Birth control/protection: None  Other Topics Concern   Not on file  Social History Narrative   Not on file   Social Determinants of Health   Financial Resource Strain: Not on file  Food Insecurity: Not on file  Transportation Needs: Not on file  Physical Activity: Not on file  Stress: Not on file  Social Connections: Not on file  Intimate Partner Violence: Not on file    FAMILY HISTORY: Family History  Problem Relation Age of Onset   Diabetes Mother    Hypertension Mother    Heart disease Mother    Diabetes Father    Hypertension Father    Colon polyps Father        age 22   Hypertension Brother    Diabetes Brother    Colon cancer Neg Hx     ALLERGIES:  is allergic to penicillins.  MEDICATIONS:  Current Outpatient Medications  Medication Sig Dispense Refill   ARIPiprazole (ABILIFY) 10 MG tablet Take 10 mg by mouth at bedtime.     Choline Fenofibrate 135 MG capsule Take 135 mg by mouth at bedtime. trilipix     Coenzyme Q10 (CO Q 10) 100 MG CAPS Take 100 mg by mouth every evening.     desipramine (NOPRAMIN) 100 MG tablet Take 100 mg  by mouth at bedtime.   0   enalapril (VASOTEC) 10 MG tablet Take 10 mg by mouth every morning.      enzalutamide (XTANDI) 40 MG capsule Take 160 mg by mouth daily. Takes 4 capsules daily     ferrous sulfate 325 (65 FE) MG tablet Take 325 mg by mouth daily.   0   hydroxychloroquine (PLAQUENIL) 200 MG tablet Take 200 mg by mouth 2 (two) times daily.     Krill Oil 300 MG CAPS  Take 300 mg by mouth daily.     meloxicam (MOBIC) 7.5 MG tablet Take 7.5 mg by mouth 2 (two) times daily.     Multiple Vitamin (MULTIVITAMIN WITH MINERALS) TABS tablet Take 1 tablet by mouth daily.     pantoprazole (PROTONIX) 40 MG tablet Take 1 tablet (40 mg total) by mouth daily. 30 minutes before breakfast 90 tablet 3   rosuvastatin (CRESTOR) 40 MG tablet TAKE 1 TABLET DAILY 90 tablet 3   venlafaxine XR (EFFEXOR-XR) 75 MG 24 hr capsule Take 225 mg by mouth daily.  0   No current facility-administered medications for this visit.    REVIEW OF SYSTEMS:   Constitutional: Denies loss of appetite Respiratory: Denies shortness of breath and wears only on excessive activity Cardiovascular: Denies chest pain, chest discomfort or lower extremity swelling Gastrointestinal:  Denies nausea, abdominal pain, vomiting, diarrhea or constipation Lymphatics: Denies lymphadenopathy MCN:OBSJGG any new bone pain or back pain.  Chronic back pain from previous surgeries All other systems were reviewed with the patient and are negative.  PHYSICAL EXAMINATION: ECOG PERFORMANCE STATUS: 1 - Symptomatic but completely ambulatory  Vitals:   04/30/23 0945  BP: (!) 147/78  Pulse: 87  Resp: (!) 22  Temp: (!) 97.3 F (36.3 C)  SpO2: 98%   Filed Weights   04/30/23 0945  Weight: 272 lb 8 oz (123.6 kg)    GENERAL:alert, no distress and comfortable NECK: supple, non-tender, without lymphadenopathy LYMPH:  no palpable lymphadenopathy in the cervical, axillary LUNGS: No respiratory distress ABDOMEN:abdomen soft, non-tender  LABORATORY DATA:  I have reviewed the data as listed Lab Results  Component Value Date   WBC 5.2 04/30/2023   HGB 12.3 (L) 04/30/2023   HCT 37.9 (L) 04/30/2023   MCV 96.9 04/30/2023   PLT 286 04/30/2023   Recent Labs    02/03/23 1115 04/30/23 1109  NA 134* 139  K 3.7 4.3  CL 103 104  CO2 21* 29  GLUCOSE 143* 101*  BUN 15 18  CREATININE 0.83 0.88  CALCIUM 9.1 9.9   GFRNONAA >60 >60  PROT  --  7.5  ALBUMIN  --  4.7  AST  --  21  ALT  --  17  ALKPHOS  --  52  BILITOT  --  0.3    RADIOGRAPHIC STUDIES: I have personally reviewed the radiological images as listed and agreed with the findings in the report. No results found.

## 2023-04-30 ENCOUNTER — Other Ambulatory Visit: Payer: Medicare Other

## 2023-04-30 ENCOUNTER — Inpatient Hospital Stay: Payer: Medicare Other

## 2023-04-30 ENCOUNTER — Other Ambulatory Visit: Payer: Self-pay | Admitting: Genetic Counselor

## 2023-04-30 ENCOUNTER — Inpatient Hospital Stay (HOSPITAL_BASED_OUTPATIENT_CLINIC_OR_DEPARTMENT_OTHER): Payer: Medicare Other | Admitting: Genetic Counselor

## 2023-04-30 ENCOUNTER — Other Ambulatory Visit: Payer: Self-pay | Admitting: *Deleted

## 2023-04-30 VITALS — BP 147/78 | HR 87 | Temp 97.3°F | Resp 22 | Wt 272.5 lb

## 2023-04-30 DIAGNOSIS — M8580 Other specified disorders of bone density and structure, unspecified site: Secondary | ICD-10-CM | POA: Diagnosis not present

## 2023-04-30 DIAGNOSIS — K4090 Unilateral inguinal hernia, without obstruction or gangrene, not specified as recurrent: Secondary | ICD-10-CM | POA: Diagnosis not present

## 2023-04-30 DIAGNOSIS — E119 Type 2 diabetes mellitus without complications: Secondary | ICD-10-CM | POA: Insufficient documentation

## 2023-04-30 DIAGNOSIS — C7951 Secondary malignant neoplasm of bone: Secondary | ICD-10-CM | POA: Insufficient documentation

## 2023-04-30 DIAGNOSIS — Z83719 Family history of colon polyps, unspecified: Secondary | ICD-10-CM | POA: Diagnosis not present

## 2023-04-30 DIAGNOSIS — Z9079 Acquired absence of other genital organ(s): Secondary | ICD-10-CM | POA: Diagnosis not present

## 2023-04-30 DIAGNOSIS — C61 Malignant neoplasm of prostate: Secondary | ICD-10-CM

## 2023-04-30 DIAGNOSIS — M810 Age-related osteoporosis without current pathological fracture: Secondary | ICD-10-CM

## 2023-04-30 DIAGNOSIS — E785 Hyperlipidemia, unspecified: Secondary | ICD-10-CM | POA: Insufficient documentation

## 2023-04-30 DIAGNOSIS — Z87891 Personal history of nicotine dependence: Secondary | ICD-10-CM | POA: Diagnosis not present

## 2023-04-30 DIAGNOSIS — C771 Secondary and unspecified malignant neoplasm of intrathoracic lymph nodes: Secondary | ICD-10-CM | POA: Diagnosis not present

## 2023-04-30 DIAGNOSIS — Z1379 Encounter for other screening for genetic and chromosomal anomalies: Secondary | ICD-10-CM

## 2023-04-30 DIAGNOSIS — I10 Essential (primary) hypertension: Secondary | ICD-10-CM | POA: Diagnosis not present

## 2023-04-30 DIAGNOSIS — J432 Centrilobular emphysema: Secondary | ICD-10-CM | POA: Insufficient documentation

## 2023-04-30 DIAGNOSIS — C786 Secondary malignant neoplasm of retroperitoneum and peritoneum: Secondary | ICD-10-CM | POA: Diagnosis not present

## 2023-04-30 DIAGNOSIS — Z833 Family history of diabetes mellitus: Secondary | ICD-10-CM | POA: Insufficient documentation

## 2023-04-30 DIAGNOSIS — K219 Gastro-esophageal reflux disease without esophagitis: Secondary | ICD-10-CM | POA: Diagnosis not present

## 2023-04-30 DIAGNOSIS — D649 Anemia, unspecified: Secondary | ICD-10-CM | POA: Insufficient documentation

## 2023-04-30 DIAGNOSIS — Z880 Allergy status to penicillin: Secondary | ICD-10-CM | POA: Insufficient documentation

## 2023-04-30 DIAGNOSIS — Z9189 Other specified personal risk factors, not elsewhere classified: Secondary | ICD-10-CM

## 2023-04-30 DIAGNOSIS — I2510 Atherosclerotic heart disease of native coronary artery without angina pectoris: Secondary | ICD-10-CM | POA: Diagnosis not present

## 2023-04-30 DIAGNOSIS — Z8249 Family history of ischemic heart disease and other diseases of the circulatory system: Secondary | ICD-10-CM | POA: Insufficient documentation

## 2023-04-30 DIAGNOSIS — J449 Chronic obstructive pulmonary disease, unspecified: Secondary | ICD-10-CM | POA: Diagnosis not present

## 2023-04-30 DIAGNOSIS — Z8042 Family history of malignant neoplasm of prostate: Secondary | ICD-10-CM | POA: Insufficient documentation

## 2023-04-30 DIAGNOSIS — Z79899 Other long term (current) drug therapy: Secondary | ICD-10-CM | POA: Insufficient documentation

## 2023-04-30 DIAGNOSIS — Z800 Family history of malignant neoplasm of digestive organs: Secondary | ICD-10-CM | POA: Diagnosis not present

## 2023-04-30 DIAGNOSIS — I700 Atherosclerosis of aorta: Secondary | ICD-10-CM | POA: Diagnosis not present

## 2023-04-30 DIAGNOSIS — Z8719 Personal history of other diseases of the digestive system: Secondary | ICD-10-CM | POA: Insufficient documentation

## 2023-04-30 LAB — CBC WITH DIFFERENTIAL/PLATELET
Abs Immature Granulocytes: 0.03 10*3/uL (ref 0.00–0.07)
Basophils Absolute: 0 10*3/uL (ref 0.0–0.1)
Basophils Relative: 1 %
Eosinophils Absolute: 0.1 10*3/uL (ref 0.0–0.5)
Eosinophils Relative: 1 %
HCT: 37.9 % — ABNORMAL LOW (ref 39.0–52.0)
Hemoglobin: 12.3 g/dL — ABNORMAL LOW (ref 13.0–17.0)
Immature Granulocytes: 1 %
Lymphocytes Relative: 28 %
Lymphs Abs: 1.4 10*3/uL (ref 0.7–4.0)
MCH: 31.5 pg (ref 26.0–34.0)
MCHC: 32.5 g/dL (ref 30.0–36.0)
MCV: 96.9 fL (ref 80.0–100.0)
Monocytes Absolute: 0.5 10*3/uL (ref 0.1–1.0)
Monocytes Relative: 9 %
Neutro Abs: 3.2 10*3/uL (ref 1.7–7.7)
Neutrophils Relative %: 60 %
Platelets: 286 10*3/uL (ref 150–400)
RBC: 3.91 MIL/uL — ABNORMAL LOW (ref 4.22–5.81)
RDW: 12.6 % (ref 11.5–15.5)
WBC: 5.2 10*3/uL (ref 4.0–10.5)
nRBC: 0 % (ref 0.0–0.2)

## 2023-04-30 LAB — CMP (CANCER CENTER ONLY)
ALT: 17 U/L (ref 0–44)
AST: 21 U/L (ref 15–41)
Albumin: 4.7 g/dL (ref 3.5–5.0)
Alkaline Phosphatase: 52 U/L (ref 38–126)
Anion gap: 6 (ref 5–15)
BUN: 18 mg/dL (ref 8–23)
CO2: 29 mmol/L (ref 22–32)
Calcium: 9.9 mg/dL (ref 8.9–10.3)
Chloride: 104 mmol/L (ref 98–111)
Creatinine: 0.88 mg/dL (ref 0.61–1.24)
GFR, Estimated: >60 mL/min (ref >60)
Glucose, Bld: 101 mg/dL — ABNORMAL HIGH (ref 70–99)
Potassium: 4.3 mmol/L (ref 3.5–5.1)
Sodium: 139 mmol/L (ref 135–145)
Total Bilirubin: 0.3 mg/dL (ref 0.3–1.2)
Total Protein: 7.5 g/dL (ref 6.5–8.1)

## 2023-04-30 LAB — FOLATE: Folate: 20.2 ng/mL (ref >5.9)

## 2023-04-30 LAB — GENETIC SCREENING ORDER

## 2023-04-30 LAB — VITAMIN B12: Vitamin B-12: 333 pg/mL (ref 180–914)

## 2023-04-30 LAB — MISCELLANEOUS TEST

## 2023-04-30 LAB — LACTATE DEHYDROGENASE: LDH: 158 U/L (ref 98–192)

## 2023-04-30 NOTE — Assessment & Plan Note (Addendum)
[name] is a very pleasant 67 year old male with history of hypertension, hyperlipidemia, COPD, prostate cancer came in with his wife today.  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 We discussed the management of M1 CRPC. [name] is progressing from novel hormone therapy. His PSADT was about 17 months back around the Spring time. There are several other options we can still explore. Pending additional testing, [name] has potential options are PARPi, chemotherapy, immunotherapy, Radium-223. If not eligible for PARPi, then pending his disease burden may consider chemotherapy if large disease volume vs Radium-223 in bone only disease. One may consider SBRT if low volume disease with few sites of oligometastases while continue ARPI with ADT. Lu-177 is approved post-taxane chemotherapy and can be considered in the future as well. I reviewed NCCN guideline with him and his wife today.   I recommend obtain repeat checking PSMA PET, PSA. Recommend attempt liquid biopsy for somatic mutations to identify somatic HRR mutation and referral for germline genetic testing. If additional imaging and testing concern for potential aggressive disease, consider biopsy to ensure no signs of high grade transformation.  He will return to see me to discuss the test results when they are available in 2 to 3 weeks.  I also connect him with our nurse navigator [name] today who graciously took him down to our genetic counselor for appointment.  Order PSMA PET  CMP, CBC, PSA, testosterone, LDH with genetic testings Referral for genetic testing for HRR mutation and MMR. [name] graciously open up an appointment to see him today. Somatic HRR testing send on blood today

## 2023-04-30 NOTE — Assessment & Plan Note (Addendum)
He is taking iron daily. Will check folate and b12 and replace if needed.

## 2023-04-30 NOTE — Patient Instructions (Addendum)
Dear [name],  Based on your prostate cancer history, you may have several options to consider.  We will need additional testing.  I recommend genetic testing and this will be taken from your blood.  Will check mutations to see if this will give [place] additional options prior to using traditional chemotherapy.  We should also obtain a PET scan to see how your prostate cancer may have spread out to the side of the body.  Sometimes radiation may still be possible if they are only had a few sites.  We will schedule you a lab appointment for genetic testing as well as other lab that are relevant to your prostate cancer.  You will get an appointment to return to see me after above testing results are available.  In the meantime, please continue with your Xtandi and Eligard.  For Bone health I have ordered bone density scan to be scheduled Taking vitamin D 1000 - 2000 IUs and calcium supplement 1000 mg-1200 mg daily Zometa can be started after dental clearance to delay fractures from cancer in the bones.  Please check in with your dentist and have any dental procedure done first before receiving Zometa in the future. Continue routine dental care Aggressive cardiovascular risk management to control diabetes, hypertension and heart disease.

## 2023-04-30 NOTE — Assessment & Plan Note (Addendum)
Bone health Ordered bone density scan.  Taking vitamin D 1000 - 2000 IUs and calcium supplement 1000 mg-1200 mg daily Zometa can be considered after dental clearance.  Recommend routine dental care Aggressive cardiovascular risk management to control diabetes, hypertension and heart disease. He will continue his medications through PCP.

## 2023-05-01 ENCOUNTER — Encounter: Payer: Self-pay | Admitting: Genetic Counselor

## 2023-05-01 LAB — TESTOSTERONE: Testosterone: 11 ng/dL — ABNORMAL LOW (ref 264–916)

## 2023-05-01 NOTE — Progress Notes (Signed)
Introduced myself to the patient,and his wife Raynelle Fanning, as the prostate nurse navigator.  He has met with Dr. Cherly Hensen to discuss his medical oncology treatment options.  Per MD recommendations patient will be scheduled for a PSMA PET, which is scheduled for 05/12/23 @ 2:30pm.  Pt aware of appointment time and date.    RN encouraged patient and his wife to reach out with any additional questions.  RN will continue to follow to ensure navigation needs are met.    Plan of care in progress.

## 2023-05-01 NOTE — Progress Notes (Signed)
REFERRING PROVIDER: Cherly Hensen. Nance Pear, MD  PRIMARY PROVIDER:  [name], MD  PRIMARY REASON FOR VISIT:  Encounter Diagnoses  Name Primary?   Prostate cancer metastatic to bone (HCC) Yes   Family history of prostate cancer    HISTORY OF PRESENT ILLNESS:   Mr. Bernick, a 67 y.o. male, was seen for a [facility] cancer genetics consultation at the request of Dr. Cherly Hensen due to a personal and family history of cancer.  Mr. Haislip presents to clinic today to discuss the possibility of a hereditary predisposition to cancer, to discuss genetic testing, and to further clarify his future cancer risks, as well as potential cancer risks for family members.   Mr. Arens was initially diagnosed with prostate cancer in 2000 but refused therapy at that time. In 2007, he had a prostatectomy (Gleason: 7). A peritoneum biopsy in 2014 showed metastatic prostate cancer.   CANCER HISTORY:  Oncology History  Prostate cancer metastatic to bone [facility])  2000 Initial Diagnosis   Initial diagnosis in [place], [place]   11/2005 Surgery   Robotic radical prostatectomy by Dr. Gaynelle Arabian. pT3a N0.  Positive margin Gleason 3+4 = 7 adenocarcinoma   07/2008 Progression   PSA 0.11. salvage radiation.  Completed in May 2010. (Dr. Kathrynn Running)   2014 Progression   Found in the appendiceal mass undergoing laparoscopy by general surgery.  Pathology showed solitary intraperitoneal metastases from prostate cancer   07/2013 - 04/2017 Chemotherapy   Started intermittent ADT   02/05/2018 Tumor Marker   PSA 3.77   05/19/2018 Tumor Marker   PSA 5.26   08/23/2018 Tumor Marker   PSA 6.22   08/2018 Progression   PSA increased to 6.22 eventually. CT showed multiple sclerotic bone lesions in the spine, sacrum, pelvis and bone scan with uptake in the right ischium and multiple ribs.   08/2018 -  Chemotherapy   He started enzalutamide. PSA reached nadir <0.2.   03/15/2020 Imaging   Bone scan 1. Resolution of focal radiotracer activity  in the right anterior fourth rib and left sacral ala since prior study. 2. Persistent asymmetric activity involving the distal clavicle right greater than left, nonspecific. While metastatic disease cannot be excluded, degenerative changes could also give this pattern. 3. Degenerative type activity of the bilateral glenohumeral joints, cervical spine, and lumbar spine   04/05/2021 Tumor Marker   PSA 0.11   07/17/2021 Tumor Marker   PSA 0.19 Testosterone 13.3   2023 Progression   Rising PSA   09/20/2021 Tumor Marker   PSA 0.26 Testosterone 20.9   12/16/2021 Tumor Marker   PSA 0.57 Testosterone 25.3   03/26/2022 Tumor Marker   PSA 1.17 Testosterone 14.1   06/2022 Imaging   PSMA PET: Positive abdominal lymph nodes mildly improved compared to prior conventional imaging 2020.   06/25/2022 Tumor Marker   PSA 1.36 Testosterone 13.9   10/01/2022 Tumor Marker   PSA 3.7 Testosterone 16.1   01/05/2023 Tumor Marker   PSA 4.2 Testosterone 17.6   03/2023 Tumor Marker   PSA 7.18   04/29/2023 Cancer Staging   Staging form: Prostate, AJCC 8th Edition - Clinical: Stage IVB (cT3a, cN0, cM1b, Grade Group: 2) - Signed by [name], MD on 04/29/2023 Gleason score: 7 Histologic grading system: 5 grade system      Past Medical History:  Diagnosis Date   Anemia    Anxiety    Arthritis    Bladder stone    Cancer (HCC) 2007   prostate CA   COPD (  chronic obstructive pulmonary disease) (HCC)    Decreased pulse 08/16/2008   LE doppler - bilateral ABIs normal, bilateral PVRs normal waveform; bilateral common iliac arteries difficult to visualize however velocities suggest less than 50% diameter reduction; bilateral LE normal velocities w/o evidence of diameter reduction   Depression    GERD (gastroesophageal reflux disease)    Hypercholesteremia    Hypertension    Mental disorder    PTSD   Pre-diabetes    Shortness of breath    Sleep apnea    wears CPAP nightly, settings at 14     Tobacco abuse    Urinary incontinence    due to prostate removal    Past Surgical History:  Procedure Laterality Date   ANTERIOR LAT LUMBAR FUSION N/A 11/17/2012   Procedure: XLIF L3 - L4 POSTERIOR L3 -L4 FUSION INSTRUMENTATION/L2 - L3 DECOMPRESSION (REVISION OF POSTERIOR HARDWARE) 1 LEVEL;  Surgeon: Venita Lick, MD;  Location: [facility] OR;  Service: Orthopedics;  Laterality: N/A;  procedure #1: start 0923, end 1057.    BACK SURGERY  2010 / 2014   BLADDER SUSPENSION     x 2   CARDIAC CATHETERIZATION     no PCI   CARDIOVASCULAR STRESS TEST  08/16/2008   EF 57%; inferior region has diaphragmatic attenuation, otherwise normal perfusion w/o evidence of ischemia or infarct; LV normal in size; no scintographic evidence of inducible ischemia; no ECG changes, negative for ischemia; low risk scan   CERVICAL FUSION     COLON RESECTION N/A 07/12/2013   peritoneal biopsy only, evidence of metastatic prostate cancer   COLONOSCOPY     COLONOSCOPY N/A 05/27/2013   RMR: Extrinsic mass effect at the level of the appendiceal orifice likely represents an appendiceal or periappendiceal process. Neovascular changes of the rectal mucosa consistent with prior history of radiation treatment. Multiple 3-5 mm polyps removed, several tubular adenomas. Diverticulosis.nex tcs 2019   COLONOSCOPY WITH PROPOFOL N/A 07/05/2018   Procedure: COLONOSCOPY WITH PROPOFOL;  Surgeon: Corbin Ade, MD;  [facility];  Service: Endoscopy;  Laterality: N/A;  9:30am   COLONOSCOPY WITH PROPOFOL N/A 03/07/2021   Procedure: COLONOSCOPY WITH PROPOFOL;  Surgeon: Corbin Ade, MD;  [facility]: [facility];  Service: Endoscopy;  Laterality: N/A;  2:30pm   CYSTOSCOPY W/ URETERAL STENT PLACEMENT N/A 10/03/2013   Procedure: CYSTOSCOPY AND REMOVAL OF BLADDER NECK STONE ;  Surgeon: Crecencio Mc, MD;  Location: [facility];  Service: Urology;  Laterality: N/A;  WITH REMOVAL OF BLADDER NECK STONE     ESOPHAGOGASTRODUODENOSCOPY (EGD) WITH  ESOPHAGEAL DILATION N/A 05/27/2013   RMR: Mild erosive reflux esophagitis with soft noncritical appearing stricture status post Maloney dilation, antral erosions with reactive gastropathy but no H. pylori   ESOPHAGOGASTRODUODENOSCOPY (EGD) WITH PROPOFOL N/A 07/05/2018   Procedure: ESOPHAGOGASTRODUODENOSCOPY (EGD) WITH PROPOFOL;  Surgeon: Corbin Ade, MD;  [facility];  Service: Endoscopy;  Laterality: N/A;   ESOPHAGOGASTRODUODENOSCOPY (EGD) WITH PROPOFOL N/A 02/05/2023   Procedure: ESOPHAGOGASTRODUODENOSCOPY (EGD) WITH PROPOFOL;  Surgeon: Corbin Ade, MD;  [facility];  Service: Endoscopy;  Laterality: N/A;  11:30 am, asa 3   HOLMIUM LASER APPLICATION N/A 10/03/2013   Procedure: HOLMIUM LASER APPLICATION;  Surgeon: Crecencio Mc, MD;  Location: [facility];  Service: Urology;  Laterality: N/A;   MALONEY DILATION N/A 07/05/2018   Procedure: Elease Hashimoto DILATION;  Surgeon: Corbin Ade, MD;  [facility];  Service: Endoscopy;  Laterality: N/A;   MALONEY DILATION N/A 02/05/2023  Procedure: MALONEY DILATION;  Surgeon: Corbin Ade, MD;  [facility];  Service: Endoscopy;  Laterality: N/A;   POLYPECTOMY  07/05/2018   Procedure: POLYPECTOMY;  Surgeon: Corbin Ade, MD;  [facility];  Service: Endoscopy;;  cecal polyp , splenic flexure polyps x4   POLYPECTOMY  03/07/2021   Procedure: POLYPECTOMY;  Surgeon: Corbin Ade, MD;  [facility];  Service: Endoscopy;;   PROSTATE SURGERY     TRANSTHORACIC ECHOCARDIOGRAM  08/16/2008   essentially normal for age   URINARY SPHINCTER IMPLANT N/A 01/31/2014   Procedure: IMPLANTATION ARTIFICIAL SPHINCTER  WITH CYSTOSCOPY;  Surgeon: Martina Sinner, MD;  Location: [facility];  Service: Urology;  Laterality: N/A;   WRIST SURGERY Right     Social History   Socioeconomic History   Marital status: Married    Spouse name: Not on file   Number of children: Not on file   Years of education: Not on  file   Highest education level: Not on file  Occupational History   Occupation: driving truck    Comment: retired  Tobacco Use   Smoking status: Former    Current packs/day: 0.00    Average packs/day: 0.3 packs/day for 43.0 years (10.8 ttl pk-yrs)    Types: Cigarettes    Start date: 02/28/1970    Quit date: 02/28/2013    Years since quitting: 10.1   Smokeless tobacco: Former    Quit date: 06/30/1980  Vaping Use   Vaping status: Never Used  Substance and Sexual Activity   Alcohol use: No   Drug use: No   Sexual activity: Yes    Birth control/protection: None  Other Topics Concern   Not on file  Social History Narrative   Not on file   Social Determinants of Health   Financial Resource Strain: Not on file  Food Insecurity: Not on file  Transportation Needs: Not on file  Physical Activity: Not on file  Stress: Not on file  Social Connections: Not on file     FAMILY HISTORY:  We obtained a detailed, 4-generation family history.  Significant diagnoses are listed below: Family History  Problem Relation Age of Onset   Diabetes Mother    Hypertension Mother    Heart disease Mother    Diabetes Father    Hypertension Father    Colon polyps Father        age 49   Colon polyps Sister    Hypertension Brother    Diabetes Brother    Cancer Paternal Uncle        unknown type   Prostate cancer Cousin 73       maternal first cousin, metastatic   Colon cancer Cousin        paternal first cousin      Mr. Greeley is unaware of previous family history of genetic testing for hereditary cancer risks. There is no reported Ashkenazi Jewish ancestry.   GENETIC COUNSELING ASSESSMENT: Mr. Locicero is a 67 y.o. male with a personal and family history of cancer which is somewhat suggestive of a hereditary predisposition to cancer given his personal history of metastatic prostate cancer. We, therefore, discussed and recommended the following at today's visit.   DISCUSSION: We discussed that 5  - 10% of cancer is hereditary, with most cases of prostate cancer associated with BRCA1/2.  There are other genes that can be associated with hereditary prostate cancer syndromes.  We discussed that testing is beneficial for several reasons including  knowing how to follow individuals after completing their treatment, identifying whether potential treatment options would be beneficial, and understanding if other family members could be at risk for cancer and allowing them to undergo genetic testing.   We reviewed the characteristics, features and inheritance patterns of hereditary cancer syndromes. We also discussed genetic testing, including the appropriate family members to test, the process of testing, insurance coverage and turn-around-time for results. We discussed the implications of a negative, positive, carrier and/or variant of uncertain significant result. We recommended Mr. Livesey pursue genetic testing for a panel that includes genes associated with cancer.   Mr. Hellmich elected to have [facility] CancerNext Panel. The CancerNext gene panel offered by [facility] [facility] includes sequencing, rearrangement analysis, and RNA analysis for the following 34 genes:   APC, ATM, AXIN2, BARD1, BMPR1A, BRCA1, BRCA2, BRIP1, CDH1, CDK4, CDKN2A, CHEK2, DICER1, HOXB13, EPCAM, GREM1, MLH1, MSH2, MSH3, MSH6, MUTYH, NF1, NTHL1, PALB2, PMS2, POLD1, POLE, PTEN, RAD51C, RAD51D, SMAD4, SMARCA4, STK11, and TP53.   Based on Mr. Bahar's personal and family history of cancer, he meets medical criteria for genetic testing. Despite that he meets criteria, he may still have an out of pocket cost. We discussed that if his out of pocket cost for testing is over $100, the laboratory will call and confirm whether he wants to proceed with testing.  If the out of pocket cost of testing is less than $100 he will be billed by the genetic testing laboratory.   PLAN: After considering the risks, benefits, and limitations, Mr. Roane provided  informed consent to pursue genetic testing and the blood sample was sent to [facility] for analysis of the CancerNext Panel. Results should be available within approximately 2-3 weeks' time, at which point they will be disclosed by telephone to Mr. Elena, as will any additional recommendations warranted by these results. Mr. Weipert will receive a summary of his genetic counseling visit and a copy of his results once available. This information will also be available in Epic.   Mr. Lax questions were answered to his satisfaction today. Our contact information was provided should additional questions or concerns arise. Thank you for the referral and allowing [place] to share in the care of your patient.   Lalla Brothers, MS, [facility] Genetic Counselor [email or web address] (P) [phone]  The patient was seen for a total of 25 minutes in face-to-face genetic counseling.  The patient brought his wife. Drs. Pamelia Hoit and/or Mosetta Putt were available to discuss this case as needed.   _______________________________________________________________________ For Office Staff:  Number of people involved in session: 2 Was an Intern/ student involved with case: no

## 2023-05-02 LAB — PSA, TOTAL AND FREE
PSA, Free Pct: 63.5 %
PSA, Free: 4.13 ng/mL
Prostate Specific Ag, Serum: 6.5 ng/mL — ABNORMAL HIGH (ref 0.0–4.0)

## 2023-05-04 ENCOUNTER — Telehealth: Payer: Self-pay

## 2023-05-04 NOTE — Telephone Encounter (Signed)
I called and spoke to patient about lab results.  Gave him an update of follow-up plan to see me after PET/CT, along with genetic testing results, and tumor board discussion on 9/20.  He understands.  Angelica Chessman would you please ask for follow-up appointment with me, for 30 minutes on Monday, September 23. If for some reason the genetic testing and [facility] results are not back on Friday, 9/20, would you let me and patient know and we will postpone his appointment? Thank you

## 2023-05-06 ENCOUNTER — Telehealth: Payer: Self-pay | Admitting: *Deleted

## 2023-05-06 NOTE — Telephone Encounter (Signed)
Dr Nelta Numbers office notes faxed to [facility]

## 2023-05-12 ENCOUNTER — Encounter: Payer: Self-pay | Admitting: Genetic Counselor

## 2023-05-12 ENCOUNTER — Ambulatory Visit (HOSPITAL_COMMUNITY)
Admission: RE | Admit: 2023-05-12 | Discharge: 2023-05-12 | Disposition: A | Discharge status: Home / Self Care | Payer: Medicare Other | Source: Ambulatory Visit

## 2023-05-12 ENCOUNTER — Telehealth: Payer: Self-pay | Admitting: Genetic Counselor

## 2023-05-12 DIAGNOSIS — C61 Malignant neoplasm of prostate: Secondary | ICD-10-CM | POA: Insufficient documentation

## 2023-05-12 DIAGNOSIS — Z1379 Encounter for other screening for genetic and chromosomal anomalies: Secondary | ICD-10-CM | POA: Insufficient documentation

## 2023-05-12 DIAGNOSIS — C7951 Secondary malignant neoplasm of bone: Secondary | ICD-10-CM | POA: Diagnosis present

## 2023-05-12 MED ORDER — FLOTUFOLASTAT F 18 GALLIUM 296-5846 MBQ/ML IV SOLN
7.8000 | Freq: Once | INTRAVENOUS | Status: AC
  Administered 2023-05-12: 7.8 via INTRAVENOUS

## 2023-05-12 NOTE — Telephone Encounter (Signed)
I contacted Mr. [name] to discuss his genetic testing results. No pathogenic variants were identified in the 34 genes analyzed. Detailed clinic note to follow.  The test report has been scanned into EPIC and is located under the Molecular Pathology section of the Results Review tab.  A portion of the result report is included below for reference.   Lalla Brothers, MS, [facility] Genetic Counselor [email or web address] (P) [phone]

## 2023-05-14 ENCOUNTER — Ambulatory Visit (HOSPITAL_BASED_OUTPATIENT_CLINIC_OR_DEPARTMENT_OTHER)
Admission: RE | Admit: 2023-05-14 | Discharge: 2023-05-14 | Disposition: A | Discharge status: Home / Self Care | Payer: Medicare Other | Source: Ambulatory Visit

## 2023-05-14 DIAGNOSIS — M810 Age-related osteoporosis without current pathological fracture: Secondary | ICD-10-CM | POA: Diagnosis present

## 2023-05-14 DIAGNOSIS — C7951 Secondary malignant neoplasm of bone: Secondary | ICD-10-CM | POA: Diagnosis present

## 2023-05-14 DIAGNOSIS — Z9189 Other specified personal risk factors, not elsewhere classified: Secondary | ICD-10-CM | POA: Diagnosis present

## 2023-05-14 DIAGNOSIS — C61 Malignant neoplasm of prostate: Secondary | ICD-10-CM | POA: Diagnosis present

## 2023-05-14 NOTE — Progress Notes (Signed)
RN placed request for STAT read of PSMA PET for upcoming Urology/Prostate Conference on 9/20.

## 2023-05-15 ENCOUNTER — Other Ambulatory Visit: Payer: Self-pay

## 2023-05-15 NOTE — Progress Notes (Signed)
Case discussed at tumor board.  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  Will refer to radiation oncology for evaluation. Will discuss with patient on Monday visit.

## 2023-05-18 ENCOUNTER — Inpatient Hospital Stay (HOSPITAL_BASED_OUTPATIENT_CLINIC_OR_DEPARTMENT_OTHER): Payer: Medicare Other

## 2023-05-18 ENCOUNTER — Other Ambulatory Visit: Payer: Self-pay

## 2023-05-18 VITALS — BP 144/81 | HR 90 | Temp 97.3°F | Resp 19 | Ht 74.0 in | Wt 266.0 lb

## 2023-05-18 DIAGNOSIS — C7951 Secondary malignant neoplasm of bone: Secondary | ICD-10-CM

## 2023-05-18 DIAGNOSIS — E538 Deficiency of other specified B group vitamins: Secondary | ICD-10-CM

## 2023-05-18 DIAGNOSIS — Z9189 Other specified personal risk factors, not elsewhere classified: Secondary | ICD-10-CM | POA: Diagnosis not present

## 2023-05-18 DIAGNOSIS — C61 Malignant neoplasm of prostate: Secondary | ICD-10-CM

## 2023-05-18 DIAGNOSIS — D649 Anemia, unspecified: Secondary | ICD-10-CM | POA: Diagnosis not present

## 2023-05-18 NOTE — Progress Notes (Signed)
[facility] CONSULT NOTE  Patient Care Team: [name], MD as PCP - General (Internal Medicine) Thurmon Fair, MD as PCP - Cardiology (Cardiology) Lennette Bihari, MD as PCP - Sleep Medicine (Cardiology) [name], RN as Oncology Nurse Navigator   ASSESSMENT & PLAN:  Prostate cancer metastatic to bone [facility]) Continue ADT with enzalutamide for now New testing showed negative for HRR mutation from [place] and [place]. Discussed at tumor. Board  His PET scan showed low burden disease.  Imaging showed about 5 sites of disease. Recommend radiation oncology evaluation for potential radiation. Referral to radiation oncology placed  Repeat PSA and PET/CT in 4 months postradiation  Normocytic anemia He is taking iron daily.  No signs of bone metastases causing anemia.  B12 low-normal level.  Will start B12 500 mcg daily.   At risk for side effect of medication Bone health Taking vitamin D 1000 - 2000 IUs and calcium supplement 1000 mg-1200 mg daily Zometa can be considered in the future if worsening bone density, or development of new bone metastases.  Recommend routine dental care Aggressive cardiovascular risk management to control diabetes, hypertension and heart disease. He will continue his medications through PCP.   Osteopenia Borderline Patient will continue routine dental care, and address any need for invasive procedures as he may need Zometa in the future. Daily vitamin D and calcium daily  Orders Placed This Encounter  Procedures   Ambulatory referral to Radiation Oncology    Referral Priority:   Routine    Referral Type:   Consultation    Referral Reason:   Specialty Services Required    Requested Specialty:   Radiation Oncology    Number of Visits Requested:   1    Patient will return in 2-3 weeks after above testing.  All questions were answered. The patient knows to call the clinic with any problems, questions or concerns. No barriers to  learning was detected.  [name], MD 9/23/20242:27 PM  CHIEF COMPLAINTS/PURPOSE OF CONSULTATION:  mCRPC with progression  HISTORY OF PRESENTING ILLNESS:   Interval history HARDEN PURGASON 67 y.o. male is here for follow-up for history of prostate cancer. I have reviewed his chart and materials related to his cancer extensively and collaborated history with the patient. Summary of oncologic history is as follows:  Oncology History  Prostate cancer metastatic to bone [facility])  2000 Initial Diagnosis   Initial diagnosis in [place], [place]   11/2005 Surgery   Robotic radical prostatectomy by Dr. Gaynelle Arabian. pT3a N0.  Positive margin Gleason 3+4 = 7 adenocarcinoma   07/2008 Progression   PSA 0.11. salvage radiation.  Completed in May 2010. (Dr. Kathrynn Running)   2014 Progression   Found in the appendiceal mass undergoing laparoscopy by general surgery.  Pathology showed solitary intraperitoneal metastases from prostate cancer   07/2013 - 04/2017 Chemotherapy   Started intermittent ADT   02/05/2018 Tumor Marker   PSA 3.77   05/19/2018 Tumor Marker   PSA 5.26   08/23/2018 Tumor Marker   PSA 6.22   08/2018 Progression   PSA increased to 6.22 eventually. CT showed multiple sclerotic bone lesions in the spine, sacrum, pelvis and bone scan with uptake in the right ischium and multiple ribs.   08/2018 -  Chemotherapy   He started enzalutamide. PSA reached nadir <0.2.   03/15/2020 Imaging   Bone scan 1. Resolution of focal radiotracer activity in the right anterior fourth rib and left sacral ala since prior study. 2. Persistent  asymmetric activity involving the distal clavicle right greater than left, nonspecific. While metastatic disease cannot be excluded, degenerative changes could also give this pattern. 3. Degenerative type activity of the bilateral glenohumeral joints, cervical spine, and lumbar spine   04/05/2021 Tumor Marker   PSA 0.11   07/17/2021 Tumor Marker   PSA  0.19 Testosterone 13.3   2023 Progression   Rising PSA   09/20/2021 Tumor Marker   PSA 0.26 Testosterone 20.9   12/16/2021 Tumor Marker   PSA 0.57 Testosterone 25.3   03/26/2022 Tumor Marker   PSA 1.17 Testosterone 14.1   06/2022 Imaging   PSMA PET: Positive abdominal lymph nodes mildly improved compared to prior conventional imaging 2020.   06/25/2022 Tumor Marker   PSA 1.36 Testosterone 13.9   10/01/2022 Tumor Marker   PSA 3.7 Testosterone 16.1   01/05/2023 Tumor Marker   PSA 4.2 Testosterone 17.6   03/2023 Tumor Marker   PSA 7.18   04/29/2023 Cancer Staging   Staging form: Prostate, AJCC 8th Edition - Clinical: Stage IVB (cT3a, cN0, cM1b, Grade Group: 2) - Signed by [name], MD on 04/29/2023 Gleason score: 7 Histologic grading system: 5 grade system    Genetic Testing   [place] testing. Negative for HRR mutation [place]: Negative for HRR mutation   05/12/2023 Imaging   PSMA PET A high left posterior mediastinal node measures 7 mm and a S.U.V. max of 3.6 on 54/4. This measured 5 mm and was not tracer avid previously. A subcarinal node measures 8 mm and a S.U.V. max of 3.4 on 83/3. Compare 7 mm and not tracer avid on the prior. Lymph nodes: Porta hepatis nodes of up to 1.7 cm and a S.U.V. max of 36.6 on 114/4. Compare 9 mm and a S.U.V. max of 21.4 on the prior. Left periaortic node measures 1.3 cm and a S.U.V. max of 35.3 on 134/4 versus 6 mm and a S.U.V. max of 4.5 on the prior. New tracer avid abdominal retroperitoneal node in the preaortic space at 4 mm and a S.U.V. max of 6.7 on 143/4.   Liver: No evidence of liver metastasis.  No focal activity to suggest skeletal metastasis. Lumbar spine fixation. Cervical spine fixation.     MEDICAL HISTORY:  Past Medical History:  Diagnosis Date   Anemia    Anxiety    Arthritis    Bladder stone    Cancer (HCC) 2007   prostate CA   COPD (chronic obstructive pulmonary disease) (HCC)    Decreased pulse  08/16/2008   LE doppler - bilateral ABIs normal, bilateral PVRs normal waveform; bilateral common iliac arteries difficult to visualize however velocities suggest less than 50% diameter reduction; bilateral LE normal velocities w/o evidence of diameter reduction   Depression    GERD (gastroesophageal reflux disease)    Hypercholesteremia    Hypertension    Mental disorder    PTSD   Pre-diabetes    Shortness of breath    Sleep apnea    wears CPAP nightly, settings at 14    Tobacco abuse    Urinary incontinence    due to prostate removal    SURGICAL HISTORY: Past Surgical History:  Procedure Laterality Date   ANTERIOR LAT LUMBAR FUSION N/A 11/17/2012   Procedure: XLIF L3 - L4 POSTERIOR L3 -L4 FUSION INSTRUMENTATION/L2 - L3 DECOMPRESSION (REVISION OF POSTERIOR HARDWARE) 1 LEVEL;  Surgeon: Venita Lick, MD;  Location: [facility] OR;  Service: Orthopedics;  Laterality: N/A;  procedure #1: start 0923, end 1057.  BACK SURGERY  2010 / 2014   BLADDER SUSPENSION     x 2   CARDIAC CATHETERIZATION     no PCI   CARDIOVASCULAR STRESS TEST  08/16/2008   EF 57%; inferior region has diaphragmatic attenuation, otherwise normal perfusion w/o evidence of ischemia or infarct; LV normal in size; no scintographic evidence of inducible ischemia; no ECG changes, negative for ischemia; low risk scan   CERVICAL FUSION     COLON RESECTION N/A 07/12/2013   peritoneal biopsy only, evidence of metastatic prostate cancer   COLONOSCOPY     COLONOSCOPY N/A 05/27/2013   RMR: Extrinsic mass effect at the level of the appendiceal orifice likely represents an appendiceal or periappendiceal process. Neovascular changes of the rectal mucosa consistent with prior history of radiation treatment. Multiple 3-5 mm polyps removed, several tubular adenomas. Diverticulosis.nex tcs 2019   COLONOSCOPY WITH PROPOFOL N/A 07/05/2018   Procedure: COLONOSCOPY WITH PROPOFOL;  Surgeon: Corbin Ade, MD;  Location: [facility];  Service:  Endoscopy;  Laterality: N/A;  9:30am   COLONOSCOPY WITH PROPOFOL N/A 03/07/2021   Procedure: COLONOSCOPY WITH PROPOFOL;  Surgeon: Corbin Ade, MD;  Location: [facility];  Service: Endoscopy;  Laterality: N/A;  2:30pm   CYSTOSCOPY W/ URETERAL STENT PLACEMENT N/A 10/03/2013   Procedure: CYSTOSCOPY AND REMOVAL OF BLADDER NECK STONE ;  Surgeon: Crecencio Mc, MD;  Location: [facility] ORS;  Service: Urology;  Laterality: N/A;  WITH REMOVAL OF BLADDER NECK STONE     ESOPHAGOGASTRODUODENOSCOPY (EGD) WITH ESOPHAGEAL DILATION N/A 05/27/2013   RMR: Mild erosive reflux esophagitis with soft noncritical appearing stricture status post Maloney dilation, antral erosions with reactive gastropathy but no H. pylori   ESOPHAGOGASTRODUODENOSCOPY (EGD) WITH PROPOFOL N/A 07/05/2018   Procedure: ESOPHAGOGASTRODUODENOSCOPY (EGD) WITH PROPOFOL;  Surgeon: Corbin Ade, MD;  [facility];  Service: Endoscopy;  Laterality: N/A;   ESOPHAGOGASTRODUODENOSCOPY (EGD) WITH PROPOFOL N/A 02/05/2023   Procedure: ESOPHAGOGASTRODUODENOSCOPY (EGD) WITH PROPOFOL;  Surgeon: Corbin Ade, MD;  [facility];  Service: Endoscopy;  Laterality: N/A;  11:30 am, asa 3   HOLMIUM LASER APPLICATION N/A 10/03/2013   Procedure: HOLMIUM LASER APPLICATION;  Surgeon: Crecencio Mc, MD;  Location: [facility];  Service: Urology;  Laterality: N/A;   MALONEY DILATION N/A 07/05/2018   Procedure: Elease Hashimoto DILATION;  Surgeon: Corbin Ade, MD;  [facility];  Service: Endoscopy;  Laterality: N/A;   MALONEY DILATION N/A 02/05/2023   Procedure: Elease Hashimoto DILATION;  Surgeon: Corbin Ade, MD;  [facility];  Service: Endoscopy;  Laterality: N/A;   POLYPECTOMY  07/05/2018   Procedure: POLYPECTOMY;  Surgeon: Corbin Ade, MD;  [facility];  Service: Endoscopy;;  cecal polyp , splenic flexure polyps x4   POLYPECTOMY  03/07/2021   Procedure: POLYPECTOMY;  Surgeon: Corbin Ade, MD;  [facility];   Service: Endoscopy;;   PROSTATE SURGERY     TRANSTHORACIC ECHOCARDIOGRAM  08/16/2008   essentially normal for age   URINARY SPHINCTER IMPLANT N/A 01/31/2014   Procedure: IMPLANTATION ARTIFICIAL SPHINCTER  WITH CYSTOSCOPY;  Surgeon: Martina Sinner, MD;  Location: [facility];  Service: Urology;  Laterality: N/A;   WRIST SURGERY Right     FAMILY HISTORY: Family History  Problem Relation Age of Onset   Diabetes Mother    Hypertension Mother    Heart disease Mother    Diabetes Father    Hypertension Father    Colon polyps Father  age 45   Colon polyps Sister    Hypertension Brother    Diabetes Brother    Cancer Paternal Uncle        unknown type   Prostate cancer Cousin 50       maternal first cousin, metastatic   Colon cancer Cousin        paternal first cousin    ALLERGIES:  is allergic to penicillins.  MEDICATIONS:  Current Outpatient Medications  Medication Sig Dispense Refill   ARIPiprazole (ABILIFY) 10 MG tablet Take 10 mg by mouth at bedtime.     Choline Fenofibrate 135 MG capsule Take 135 mg by mouth at bedtime. trilipix     Coenzyme Q10 (CO Q 10) 100 MG CAPS Take 100 mg by mouth every evening.     desipramine (NOPRAMIN) 100 MG tablet Take 100 mg by mouth at bedtime.   0   enalapril (VASOTEC) 10 MG tablet Take 10 mg by mouth every morning.      enzalutamide (XTANDI) 40 MG capsule Take 160 mg by mouth daily. Takes 4 capsules daily     ferrous sulfate 325 (65 FE) MG tablet Take 325 mg by mouth daily.   0   hydroxychloroquine (PLAQUENIL) 200 MG tablet Take 200 mg by mouth 2 (two) times daily.     Krill Oil 300 MG CAPS Take 300 mg by mouth daily.     meloxicam (MOBIC) 7.5 MG tablet Take 7.5 mg by mouth 2 (two) times daily.     Multiple Vitamin (MULTIVITAMIN WITH MINERALS) TABS tablet Take 1 tablet by mouth daily.     pantoprazole (PROTONIX) 40 MG tablet Take 1 tablet (40 mg total) by mouth daily. 30 minutes before breakfast 90 tablet 3   rosuvastatin (CRESTOR) 40 MG  tablet TAKE 1 TABLET DAILY 90 tablet 3   venlafaxine XR (EFFEXOR-XR) 75 MG 24 hr capsule Take 225 mg by mouth daily.  0   No current facility-administered medications for this visit.    REVIEW OF SYSTEMS:   Constitutional: Denies loss of appetite Respiratory: Denies shortness of breath and wears only on excessive activity Cardiovascular: Denies chest pain, chest discomfort or lower extremity swelling Gastrointestinal:  Denies nausea, abdominal pain, vomiting, diarrhea or constipation Lymphatics: Denies lymphadenopathy UXL:[id number] any new bone pain or back pain.  Chronic back pain from previous surgeries All other systems were reviewed with the patient and are negative.  PHYSICAL EXAMINATION: ECOG PERFORMANCE STATUS: 1 - Symptomatic but completely ambulatory  Vitals:   05/18/23 1319  BP: (!) 144/81  Pulse: 90  Resp: 19  Temp: (!) 97.3 F (36.3 C)  SpO2: 100%   Filed Weights   05/18/23 1319  Weight: 266 lb (120.7 kg)    GENERAL:alert, no distress and comfortable NECK: supple, non-tender, without lymphadenopathy LYMPH:  no palpable lymphadenopathy in the cervical, axillary LUNGS: No respiratory distress ABDOMEN:abdomen soft, non-tender  LABORATORY DATA:  I have reviewed the data as listed Lab Results  Component Value Date   WBC 5.2 04/30/2023   HGB 12.3 (L) 04/30/2023   HCT 37.9 (L) 04/30/2023   MCV 96.9 04/30/2023   PLT 286 04/30/2023   Recent Labs    02/03/23 1115 04/30/23 1109  NA 134* 139  K 3.7 4.3  CL 103 104  CO2 21* 29  GLUCOSE 143* 101*  BUN 15 18  CREATININE 0.83 0.88  CALCIUM 9.1 9.9  GFRNONAA >60 >60  PROT  --  7.5  ALBUMIN  --  4.7  AST  --  21  ALT  --  17  ALKPHOS  --  52  BILITOT  --  0.3    RADIOGRAPHIC STUDIES: I have personally reviewed the radiological images as listed and agreed with the findings in the report. NM PET (PSMA) SKULL TO MID THIGH  Result Date: 05/14/2023 CLINICAL DATA:  Prostate cancer with bone metastasis.  Radiation therapy. Surgery in 2007. EXAM: NUCLEAR MEDICINE PET SKULL BASE TO THIGH TECHNIQUE: 7.8 mCi Flotufolastat (Posluma) was injected intravenously. Full-ring PET imaging was performed from the skull base to thigh after the radiotracer. CT data was obtained and used for attenuation correction and anatomic localization. COMPARISON:  06/27/2022 FINDINGS: NECK No radiotracer activity in neck lymph nodes. Incidental CT finding: No cervical adenopathy. CHEST A high left posterior mediastinal node measures 7 mm and a S.U.V. max of 3.6 on 54/4. This measured 5 mm and was not tracer avid previously. A subcarinal node measures 8 mm and a S.U.V. max of 3.4 on 83/3. Compare 7 mm and not tracer avid on the prior. No pulmonary parenchymal tracer affinity. Incidental CT finding: Mild cardiomegaly. Aortic and coronary artery calcification. Centrilobular emphysema. The questioned lingular and left apical pulmonary nodules are not readily apparent. ABDOMEN/PELVIS Prostate: No focal activity in the prostate bed. Lymph nodes: Porta hepatis nodes of up to 1.7 cm and a S.U.V. max of 36.6 on 114/4. Compare 9 mm and a S.U.V. max of 21.4 on the prior. Left periaortic node measures 1.3 cm and a S.U.V. max of 35.3 on 134/4 versus 6 mm and a S.U.V. max of 4.5 on the prior. New tracer avid abdominal retroperitoneal node in the preaortic space at 4 mm and a S.U.V. max of 6.7 on 143/4. Liver: No evidence of liver metastasis. Incidental CT finding: Aortic atherosclerosis. Penile implant with reservoir in the right anterior pelvis. Prostatectomy. Fat containing left inguinal hernia. SKELETON No focal activity to suggest skeletal metastasis. Lumbar spine fixation. Cervical spine fixation. IMPRESSION: 1. Progression of abdominal nodal metastasis. 2. Small mediastinal nodes have enlarged and demonstrate new mild tracer affinity, suspicious for early metastasis. 3. Previously described left-sided pulmonary nodules are no longer identified on this  nondedicated, minimally motion degraded exam. 4. Incidental findings, including: Coronary artery atherosclerosis. Aortic Atherosclerosis (ICD10-I70.0). Emphysema (ICD10-J43.9). Electronically Signed   By: [name] M.D.   On: 05/14/2023 13:39   DG Bone Density  Result Date: 05/14/2023 EXAM: DUAL X-RAY ABSORPTIOMETRY (DXA) FOR BONE MINERAL DENSITY IMPRESSION: Patient: [name] [name] Birth Date: Dec 21, 1955 [age] Patient ID: [id number] Height: 71.0 in. Weight: 272.5 lbs. Measured: 05/14/2023 10:20:11 AM (18 SP 3) Sex: Male Ethnicity: White Analyzed: 05/14/2023 10:26:50 AM (18 SP 3) FRAX* Based on femoral neck BMD: DualFemur (Left) 10-year Probability of Fracture Major Osteoporotic Fracture: 4.7 % Hip Fracture:                0.2 % Population:                  [place] (Caucasian) Risk Factors:                Rheumatoid Arthritis *FRAX is a Armed forces logistics/support/administrative officer of the [employer] for Metabolic Bone Diseases, a World Science writer (WHO) [employer] [phone]). Referring Physician:  [name] Your patient completed a bone mineral density test using GE Lunar iDXA system (analysis version: 16). Technologist: [name] PATIENT: Name: Geovani, Isa Patient ID: [id number] Birth Date:  1955-10-16 Height: 71.0 in. Sex: Male Measured: 05/14/2023 Weight: 272.5 lbs. Indications: Abilfy, Caucasian, COPD, High Risk Medication, Pantoprazole, Plaquenil, Prostate Cancer, Rheumatoid Arthritis, Xtandi Fractures: Treatments: Calcium, Vitamin D ASSESSMENT: The BMD measured at Left Forearm Radius 33% is 0.862 g/cm2 with a T-score of -1.3. This patient is considered osteopenic according to the World Health Organization [facility]) criteria. The scan qaulity is good. Lumbar Spine excluded due to surgical fusions. Site Region Measured Date Measured Age YA BMD Significant CHANGE T-score DualFemur Total Right 05/14/2023 67.9 -0.1 1.081 g/cm2 Left Forearm  Radius 33% 05/14/2023 67.9 -1.3 0.862 g/cm2 DualFemur Total Mean 05/14/2023 67.9 0.1 1.109 g/cm2 World Health Organization [facility]) criteria for post-menopausal, Caucasian Women: Normal       T-score at or above -1 SD Osteopenia   T-score between -1 and -2.5 SD Osteoporosis T-score at or below -2.5 SD RECOMMENDATION: Mild to aggressive therapies are available in the form of Hormone replacement therapy (HRT), bisphosphonates, Calcitonin, and SERMs. Additionally, all patients should ensure an adequate intake of dietary calcium (1200 mg/d) and vitamin D (400-800 IU daily). FOLLOW-UP: People with diagnosed cases of osteoporosis or osteopenia should be regularly tested for bone mineral density. For patients eligible for Medicare, routine testing is allowed once every 2 years. The testing frequency can be increased to one year for patients who have rapidly progressing disease, or for those who are receiving medical therapy to restore bone mass. I have reviewed this study and agree with the findings. [facility] Electronically Signed   By: Frederico Hamman M.D.   On: 05/14/2023 10:11

## 2023-05-18 NOTE — Assessment & Plan Note (Addendum)
Continue ADT with enzalutamide for now New testing showed negative for HRR mutation from [place] and [place]. Discussed at tumor. Board  His PET scan showed low burden disease.  Imaging showed about 5 sites of disease. Recommend radiation oncology evaluation for potential radiation. Referral to radiation oncology placed  Repeat PSA in 4 months postradiation.

## 2023-05-18 NOTE — Patient Instructions (Addendum)
For prostate cancer, will continue your Xtandi and shot of Eligard. See Dr. Kathrynn Running for evaluation for radiation. See me in 4 months with PSA.  Continue vitamin D and calcium daily and also exercise, weight bearing if possible.  See your dentist. Let them know you may need osteoporosis medicine in the future called Zometa.  Continue see your PCP to optimally manage your cholesterol, blood pressure, prevent diabetes.

## 2023-05-18 NOTE — Assessment & Plan Note (Signed)
Bone health Taking vitamin D 1000 - 2000 IUs and calcium supplement 1000 mg-1200 mg daily Zometa can be considered in the future if worsening bone density, or development of new bone metastases.  Recommend routine dental care Aggressive cardiovascular risk management to control diabetes, hypertension and heart disease. He will continue his medications through PCP.

## 2023-05-18 NOTE — Assessment & Plan Note (Signed)
He is taking iron daily.  No signs of bone metastases causing anemia.  B12 low-normal level.  Will start B12 500 mcg daily.

## 2023-05-19 ENCOUNTER — Encounter: Payer: Self-pay | Admitting: Genetic Counselor

## 2023-05-19 ENCOUNTER — Ambulatory Visit: Payer: Self-pay | Admitting: Genetic Counselor

## 2023-05-19 DIAGNOSIS — Z1379 Encounter for other screening for genetic and chromosomal anomalies: Secondary | ICD-10-CM

## 2023-05-19 NOTE — Progress Notes (Signed)
HPI:   Mr. Hefferon was previously seen in the [facility] due to a personal and family history of cancer and concerns regarding a hereditary predisposition to cancer. Please refer to our prior cancer genetics clinic note for more information regarding our discussion, assessment and recommendations, at the time. Mr. Fillmore recent genetic test results were disclosed to him, as were recommendations warranted by these results. These results and recommendations are discussed in more detail below.  CANCER HISTORY:  Oncology History  Prostate cancer metastatic to bone [facility])  2000 Initial Diagnosis   Initial diagnosis in [place], [place]   11/2005 Surgery   Robotic radical prostatectomy by Dr. Gaynelle Arabian. pT3a N0.  Positive margin Gleason 3+4 = 7 adenocarcinoma   07/2008 Progression   PSA 0.11. salvage radiation.  Completed in May 2010. (Dr. Kathrynn Running)   2014 Progression   Found in the appendiceal mass undergoing laparoscopy by general surgery.  Pathology showed solitary intraperitoneal metastases from prostate cancer   07/2013 - 04/2017 Chemotherapy   Started intermittent ADT   02/05/2018 Tumor Marker   PSA 3.77   05/19/2018 Tumor Marker   PSA 5.26   08/23/2018 Tumor Marker   PSA 6.22   08/2018 Progression   PSA increased to 6.22 eventually. CT showed multiple sclerotic bone lesions in the spine, sacrum, pelvis and bone scan with uptake in the right ischium and multiple ribs.   08/2018 -  Chemotherapy   He started enzalutamide. PSA reached nadir <0.2.   03/15/2020 Imaging   Bone scan 1. Resolution of focal radiotracer activity in the right anterior fourth rib and left sacral ala since prior study. 2. Persistent asymmetric activity involving the distal clavicle right greater than left, nonspecific. While metastatic disease cannot be excluded, degenerative changes could also give this pattern. 3. Degenerative type activity of the bilateral glenohumeral joints, cervical  spine, and lumbar spine   04/05/2021 Tumor Marker   PSA 0.11   07/17/2021 Tumor Marker   PSA 0.19 Testosterone 13.3   2023 Progression   Rising PSA   09/20/2021 Tumor Marker   PSA 0.26 Testosterone 20.9   12/16/2021 Tumor Marker   PSA 0.57 Testosterone 25.3   03/26/2022 Tumor Marker   PSA 1.17 Testosterone 14.1   06/2022 Imaging   PSMA PET: Positive abdominal lymph nodes mildly improved compared to prior conventional imaging 2020.   06/25/2022 Tumor Marker   PSA 1.36 Testosterone 13.9   10/01/2022 Tumor Marker   PSA 3.7 Testosterone 16.1   01/05/2023 Tumor Marker   PSA 4.2 Testosterone 17.6   03/2023 Tumor Marker   PSA 7.18   04/29/2023 Cancer Staging   Staging form: Prostate, AJCC 8th Edition - Clinical: Stage IVB (cT3a, cN0, cM1b, Grade Group: 2) - Signed by [name], MD on 04/29/2023 Gleason score: 7 Histologic grading system: 5 grade system    Genetic Testing   [facility] testing. Negative for HRR mutation Tempus Liquid: Negative for HRR mutation   05/12/2023 Imaging   PSMA PET A high left posterior mediastinal node measures 7 mm and a S.U.V. max of 3.6 on 54/4. This measured 5 mm and was not tracer avid previously. A subcarinal node measures 8 mm and a S.U.V. max of 3.4 on 83/3. Compare 7 mm and not tracer avid on the prior. Lymph nodes: Porta hepatis nodes of up to 1.7 cm and a S.U.V. max of 36.6 on 114/4. Compare 9 mm and a S.U.V. max of 21.4 on the prior. Left periaortic node measures  1.3 cm and a S.U.V. max of 35.3 on 134/4 versus 6 mm and a S.U.V. max of 4.5 on the prior. New tracer avid abdominal retroperitoneal node in the preaortic space at 4 mm and a S.U.V. max of 6.7 on 143/4.   Liver: No evidence of liver metastasis.  No focal activity to suggest skeletal metastasis. Lumbar spine fixation. Cervical spine fixation.     FAMILY HISTORY:  We obtained a detailed, 4-generation family history.  Significant diagnoses are listed below:      Family  History  Problem Relation Age of Onset   Diabetes Mother     Hypertension Mother     Heart disease Mother     Diabetes Father     Hypertension Father     Colon polyps Father          age 79   Colon polyps Sister     Hypertension Brother     Diabetes Brother     Cancer Paternal Uncle          unknown type   Prostate cancer Cousin 33        maternal first cousin, metastatic   Colon cancer Cousin          paternal first cousin             Mr. Wayman is unaware of previous family history of genetic testing for hereditary cancer risks. There is no reported Ashkenazi Jewish ancestry.   GENETIC TEST RESULTS:  The [employer] CancerNext Panel found no pathogenic mutations.   The CancerNext gene panel offered by [employer] includes sequencing, rearrangement analysis, and RNA analysis for the following 34 genes:   APC, ATM, AXIN2, BARD1, BMPR1A, BRCA1, BRCA2, BRIP1, CDH1, CDK4, CDKN2A, CHEK2, DICER1, HOXB13, EPCAM, GREM1, MLH1, MSH2, MSH3, MSH6, MUTYH, NF1, NTHL1, PALB2, PMS2, POLD1, POLE, PTEN, RAD51C, RAD51D, SMAD4, SMARCA4, STK11, and TP53.    The test report has been scanned into EPIC and is located under the Molecular Pathology section of the Results Review tab.  A portion of the result report is included below for reference. Genetic testing reported out on 05/11/2023.       Even though a pathogenic variant was not identified, possible explanations for his personal history of cancer may include: There may be no hereditary risk for cancer in the family. The cancers in Mr. Fetting and/or his family may be due to other genetic or environmental factors. There may be a gene mutation in one of these genes that current testing methods cannot detect, but that chance is small. There could be another gene that has not yet been discovered, or that we have not yet tested, that is responsible for the cancer diagnoses in the family.   Therefore, it is important to remain in touch with cancer  genetics in the future so that we can continue to offer Mr. Sisemore the most up to date genetic testing.   ADDITIONAL GENETIC TESTING:  We discussed with Mr. Rummer that his genetic testing was fairly extensive.  If there are genes identified to increase cancer risk that can be analyzed in the future, we would be happy to discuss and coordinate this testing at that time.    CANCER SCREENING RECOMMENDATIONS:  Mr. Bibeault test result is considered negative (normal).  This means that we have not identified a hereditary cause for his personal and family history of cancer at this time. Most cancers happen by chance and this negative test suggests that his cancer may fall into  this category.    An individual's cancer risk and medical management are not determined by genetic test results alone. Overall cancer risk assessment incorporates additional factors, including personal medical history, family history, and any available genetic information that may result in a personalized plan for cancer prevention and surveillance. Therefore, it is recommended he continue to follow the cancer management and screening guidelines provided by his oncology and primary healthcare provider.  RECOMMENDATIONS FOR FAMILY MEMBERS:   Since he did not inherit a mutation in a cancer predisposition gene included on this panel, his grandchildren could not have inherited a mutation from him in one of these genes.  FOLLOW-UP:  Cancer genetics is a rapidly advancing field and it is possible that new genetic tests will be appropriate for him and/or his family members in the future. We encouraged him to remain in contact with cancer genetics on an annual basis so we can update his personal and family histories and let him know of advances in cancer genetics that may benefit this family.   Our contact number was provided. Mr. Rametta questions were answered to his satisfaction, and he knows he is welcome to call us at anytime with  additional questions or concerns.   Lalla Brothers, MS, [facility] Genetic Counselor [email or web address] (P) [phone]

## 2023-05-20 ENCOUNTER — Encounter: Payer: Self-pay | Admitting: Gastroenterology

## 2023-05-20 ENCOUNTER — Ambulatory Visit (INDEPENDENT_AMBULATORY_CARE_PROVIDER_SITE_OTHER): Payer: Medicare Other | Admitting: Gastroenterology

## 2023-05-20 VITALS — BP 122/79 | HR 84 | Temp 98.8°F | Ht 74.0 in | Wt 268.4 lb

## 2023-05-20 DIAGNOSIS — R1319 Other dysphagia: Secondary | ICD-10-CM | POA: Diagnosis not present

## 2023-05-20 DIAGNOSIS — K219 Gastro-esophageal reflux disease without esophagitis: Secondary | ICD-10-CM | POA: Diagnosis not present

## 2023-05-20 MED ORDER — PANTOPRAZOLE SODIUM 20 MG PO TBEC: 20.0000 mg | DELAYED_RELEASE_TABLET | Freq: Every day | ORAL | 0 refills | Status: DC

## 2023-05-20 NOTE — Patient Instructions (Signed)
I have sent in pantoprazole 20 mg tablets for you.  You can take 1 tablet daily, 30 minutes prior to breakfast.  This will be in place of your 40 mg tablets.  While taking the 20 mg if you have any recurrence of reflux symptoms or significant breakthrough symptoms then you can stop the 20 mg and resume 40 mg daily.  Follow a GERD diet:  Avoid fried, fatty, greasy, spicy, citrus foods. Avoid caffeine and carbonated beverages. Avoid chocolate. Try eating 4-6 small meals a day rather than 3 large meals. Do not eat within 3 hours of laying down. Prop head of bed up on wood or bricks to create a 6 inch incline.  We will have you follow-up in 1 year, sooner if needed.  Please feel free to reach out if you have any other questions or concerns.  It was a pleasure to see you today. I want to create trusting relationships with patients. If you receive a survey regarding your visit,  I greatly appreciate you taking time to fill this out on paper or through your MyChart. I value your feedback.  Brooke Bonito, MSN, FNP-BC, AGACNP-BC [facility]

## 2023-05-20 NOTE — Progress Notes (Signed)
GI Office Note    Referring Provider: [name], MD Primary Care Physician:  [name], MD Primary Gastroenterologist: [name].Rourk, MD  Date:  05/20/2023  ID:  [name], DOB 1956/04/13, MRN [id number]   Chief Complaint   Chief Complaint  Patient presents with   Follow-up    Follow up on GERD. Pt is better   History of Present Illness  [name] is a 67 y.o. male with a history of metastatic prostate cancer s/p peritoneal implants and radiation proctitis, adenomatous colon polyps, Schatzki's rings s/p dilation, COPD, OSA with CPAP, tobacco use, hypercholesteremia, HTN, GERD, anxiety and depression presenting today for follow-up post procedure.   Colonoscopy 07/05/18: -Five 4-6 mm polyps at the splenic flexure and cecum (sessile serrated polyps) -Mild sigmoid and descending diverticulosis -Repeat colonoscopy in 3 years   EGD 07/05/18: -Mild Schatzki's ring at GE junction s/p dilation -Small hiatal hernia -Normal stomach -Normal duodenum s/p biopsy -Advised no repeat upper endoscopy   Colonoscopy 03/07/21: -Nonbleeding internal hemorrhoids -two 4 to 6 mm polyps in the splenic flexure (tubular adenoma) -Pancolonic diverticulosis -Advised repeat colonoscopy in 5 years  Last office visit 12/29/22. Having issues with a lot of burping with ice cream.  Symptoms going on for about 6 months and recently worsening and occurring with every meal.  Good appetite denied any nausea vomiting or odynophagia.  Felt like food getting stuck at the base of his neck and slow to go down without regurgitation.  Actual heartburn controlled with pantoprazole 40 mg once daily.  Denied any lower GI concerns.  Scheduled with upper endoscopy with dilation with Dr. Jena Gauss.  Encouraged daily PPI and iron.  EGD 02/05/2023: -Mild Schatzki's ring s/p dilation -Small hiatal hernia -Normal duodenum -Return to office in 1 year, continue current medications  Today:  Has been on  pantoprazole for many many years.   Dysphagia improved but still has some sensation of something in his throat but related to scar tissue from prior surgery/Adam's apple.  Symptoms significantly improved from prior to dilation.  No longer having choking feeling with swallowing solids.  Denies any nausea, vomiting, lack of appetite, or abdominal pain.  No lower GI concerns today.  About to resume radiation for prostate cancer. Has appointment 10/7 and then will set up treatments. Has had radiation before in 2010.    Current Outpatient Medications  Medication Sig Dispense Refill   ARIPiprazole (ABILIFY) 10 MG tablet Take 10 mg by mouth at bedtime.     Choline Fenofibrate 135 MG capsule Take 135 mg by mouth at bedtime. trilipix     Coenzyme Q10 (CO Q 10) 100 MG CAPS Take 100 mg by mouth every evening.     desipramine (NOPRAMIN) 100 MG tablet Take 100 mg by mouth at bedtime.   0   enalapril (VASOTEC) 10 MG tablet Take 10 mg by mouth every morning.      enzalutamide (XTANDI) 40 MG capsule Take 160 mg by mouth daily. Takes 4 capsules daily     ferrous sulfate 325 (65 FE) MG tablet Take 325 mg by mouth daily.   0   hydroxychloroquine (PLAQUENIL) 200 MG tablet Take 200 mg by mouth 2 (two) times daily.     Krill Oil 300 MG CAPS Take 300 mg by mouth daily.     meloxicam (MOBIC) 7.5 MG tablet Take 7.5 mg by mouth 2 (two) times daily.     Multiple Vitamin (MULTIVITAMIN WITH MINERALS) TABS tablet Take 1 tablet by mouth  daily.     pantoprazole (PROTONIX) 20 MG tablet Take 1 tablet (20 mg total) by mouth daily. 30 tablet 0   pantoprazole (PROTONIX) 40 MG tablet Take 1 tablet (40 mg total) by mouth daily. 30 minutes before breakfast 90 tablet 3   rosuvastatin (CRESTOR) 40 MG tablet TAKE 1 TABLET DAILY 90 tablet 3   venlafaxine XR (EFFEXOR-XR) 75 MG 24 hr capsule Take 225 mg by mouth daily.  0   No current facility-administered medications for this visit.    Past Medical History:  Diagnosis Date    Anemia    Anxiety    Arthritis    Bladder stone    Cancer (HCC) 2007   prostate CA   COPD (chronic obstructive pulmonary disease) (HCC)    Decreased pulse 08/16/2008   LE doppler - bilateral ABIs normal, bilateral PVRs normal waveform; bilateral common iliac arteries difficult to visualize however velocities suggest less than 50% diameter reduction; bilateral LE normal velocities w/o evidence of diameter reduction   Depression    GERD (gastroesophageal reflux disease)    Hypercholesteremia    Hypertension    Mental disorder    PTSD   Pre-diabetes    Shortness of breath    Sleep apnea    wears CPAP nightly, settings at 14    Tobacco abuse    Urinary incontinence    due to prostate removal    Past Surgical History:  Procedure Laterality Date   ANTERIOR LAT LUMBAR FUSION N/A 11/17/2012   Procedure: XLIF L3 - L4 POSTERIOR L3 -L4 FUSION INSTRUMENTATION/L2 - L3 DECOMPRESSION (REVISION OF POSTERIOR HARDWARE) 1 LEVEL;  Surgeon: Venita Lick, MD;  Location: [facility] OR;  Service: Orthopedics;  Laterality: N/A;  procedure #1: start 0923, end 1057.    BACK SURGERY  2010 / 2014   BLADDER SUSPENSION     x 2   CARDIAC CATHETERIZATION     no PCI   CARDIOVASCULAR STRESS TEST  08/16/2008   EF 57%; inferior region has diaphragmatic attenuation, otherwise normal perfusion w/o evidence of ischemia or infarct; LV normal in size; no scintographic evidence of inducible ischemia; no ECG changes, negative for ischemia; low risk scan   CERVICAL FUSION     COLON RESECTION N/A 07/12/2013   peritoneal biopsy only, evidence of metastatic prostate cancer   COLONOSCOPY     COLONOSCOPY N/A 05/27/2013   RMR: Extrinsic mass effect at the level of the appendiceal orifice likely represents an appendiceal or periappendiceal process. Neovascular changes of the rectal mucosa consistent with prior history of radiation treatment. Multiple 3-5 mm polyps removed, several tubular adenomas. Diverticulosis.nex tcs 2019    COLONOSCOPY WITH PROPOFOL N/A 07/05/2018   Procedure: COLONOSCOPY WITH PROPOFOL;  Surgeon: Corbin Ade, MD;  Location: [facility];  Service: Endoscopy;  Laterality: N/A;  9:30am   COLONOSCOPY WITH PROPOFOL N/A 03/07/2021   Procedure: COLONOSCOPY WITH PROPOFOL;  Surgeon: Corbin Ade, MD;  Location: [facility];  Service: Endoscopy;  Laterality: N/A;  2:30pm   CYSTOSCOPY W/ URETERAL STENT PLACEMENT N/A 10/03/2013   Procedure: CYSTOSCOPY AND REMOVAL OF BLADDER NECK STONE ;  Surgeon: Crecencio Mc, MD;  Location: [facility];  Service: Urology;  Laterality: N/A;  WITH REMOVAL OF BLADDER NECK STONE     ESOPHAGOGASTRODUODENOSCOPY (EGD) WITH ESOPHAGEAL DILATION N/A 05/27/2013   RMR: Mild erosive reflux esophagitis with soft noncritical appearing stricture status post Maloney dilation, antral erosions with reactive gastropathy but no H. pylori   ESOPHAGOGASTRODUODENOSCOPY (EGD) WITH PROPOFOL N/A 07/05/2018  Procedure: ESOPHAGOGASTRODUODENOSCOPY (EGD) WITH PROPOFOL;  Surgeon: Corbin Ade, MD;  [facility];  Service: Endoscopy;  Laterality: N/A;   ESOPHAGOGASTRODUODENOSCOPY (EGD) WITH PROPOFOL N/A 02/05/2023   Procedure: ESOPHAGOGASTRODUODENOSCOPY (EGD) WITH PROPOFOL;  Surgeon: Corbin Ade, MD;  [facility];  Service: Endoscopy;  Laterality: N/A;  11:30 am, asa 3   HOLMIUM LASER APPLICATION N/A 10/03/2013   Procedure: HOLMIUM LASER APPLICATION;  Surgeon: Crecencio Mc, MD;  Location: [facility];  Service: Urology;  Laterality: N/A;   MALONEY DILATION N/A 07/05/2018   Procedure: Elease Hashimoto DILATION;  Surgeon: Corbin Ade, MD;  [facility]: [facility];  Service: Endoscopy;  Laterality: N/A;   MALONEY DILATION N/A 02/05/2023   Procedure: Elease Hashimoto DILATION;  Surgeon: Corbin Ade, MD;  [facility];  Service: Endoscopy;  Laterality: N/A;   POLYPECTOMY  07/05/2018   Procedure: POLYPECTOMY;  Surgeon: Corbin Ade, MD;  [facility];  Service: Endoscopy;;  cecal  polyp , splenic flexure polyps x4   POLYPECTOMY  03/07/2021   Procedure: POLYPECTOMY;  Surgeon: Corbin Ade, MD;  [facility]: [facility];  Service: Endoscopy;;   PROSTATE SURGERY     TRANSTHORACIC ECHOCARDIOGRAM  08/16/2008   essentially normal for age   URINARY SPHINCTER IMPLANT N/A 01/31/2014   Procedure: IMPLANTATION ARTIFICIAL SPHINCTER  WITH CYSTOSCOPY;  Surgeon: Martina Sinner, MD;  Location: [facility];  Service: Urology;  Laterality: N/A;   WRIST SURGERY Right     Family History  Problem Relation Age of Onset   Diabetes Mother    Hypertension Mother    Heart disease Mother    Diabetes Father    Hypertension Father    Colon polyps Father        age 24   Colon polyps Sister    Hypertension Brother    Diabetes Brother    Cancer Paternal Uncle        unknown type   Prostate cancer Cousin 43       maternal first cousin, metastatic   Colon cancer Cousin        paternal first cousin    Allergies as of 05/20/2023 - Review Complete 05/20/2023  Allergen Reaction Noted   Penicillins Anaphylaxis 09/30/2011    Social History   Socioeconomic History   Marital status: Married    Spouse name: Not on file   Number of children: Not on file   Years of education: Not on file   Highest education level: Not on file  Occupational History   Occupation: driving truck    Comment: retired  Tobacco Use   Smoking status: Former    Current packs/day: 0.00    Average packs/day: 0.3 packs/day for 43.0 years (10.8 ttl pk-yrs)    Types: Cigarettes    Start date: 02/28/1970    Quit date: 02/28/2013    Years since quitting: 10.2   Smokeless tobacco: Former    Quit date: 06/30/1980  Vaping Use   Vaping status: Never Used  Substance and Sexual Activity   Alcohol use: No   Drug use: No   Sexual activity: Yes    Birth control/protection: None  Other Topics Concern   Not on file  Social History Narrative   Not on file   Social Determinants of Health   Financial Resource Strain: Not  on file  Food Insecurity: Not on file  Transportation Needs: Not on file  Physical Activity: Not on file  Stress: Not on file  Social  Connections: Not on file     Review of Systems   Gen: Denies fever, chills, anorexia. Denies fatigue, weakness, weight loss.  CV: Denies chest pain, palpitations, syncope, peripheral edema, and claudication. Resp: Denies dyspnea at rest, cough, wheezing, coughing up blood, and pleurisy. GI: See HPI Derm: Denies rash, itching, dry skin Psych: Denies depression, anxiety, memory loss, confusion. No homicidal or suicidal ideation.  Heme: Denies bruising, bleeding, and enlarged lymph nodes.   Physical Exam   BP 122/79   Pulse 84   Temp 98.8 F (37.1 C)   Ht 6\' 2"  (1.88 m)   Wt 268 lb 6.4 oz (121.7 kg)   BMI 34.46 kg/m   General:   Alert and oriented. No distress noted. Pleasant and cooperative.  Head:  Normocephalic and atraumatic. Eyes:  Conjuctiva clear without scleral icterus. Abdomen:  +BS, soft, non-tender and non-distended. No rebound or guarding. No HSM or masses noted. Msk:  Symmetrical without gross deformities. Normal posture. Extremities:  Without edema. Neurologic:  Alert and  oriented x4 Psych:  Alert and cooperative. Normal mood and affect.  Assessment  [name] is a 67 y.o. male with a history of metastatic prostate cancer s/p peritoneal implants and radiation proctitis (soon to resume radiation), adenomatous colon polyps, Schatzki's rings s/p dilation, COPD, OSA with CPAP, tobacco use, hypercholesteremia, HTN, GERD, anxiety and depression presenting today for follow-up post procedure.   GERD, dysphagia: Reflux symptoms have been well-controlled with pantoprazole 40 mg once daily.  Recent EGD with Schatzki's ring s/p dilation and small hiatal hernia without any evidence of gastritis or esophagitis.  Given he has been on pantoprazole 40 mg daily long-term without evidence of Barrett's esophagus we will attempt at weaning  pantoprazole to 20 mg daily to see if this still provides symptom relief.  Will trial 30-day supply and if this works well we will continue (and send in prescription be [facility]) otherwise he will resume 40 mg daily if he has any breakthrough symptoms.  PLAN   Wean pantoprazole to 20 mg daily. (New prescription provided).  If recurrence of symptoms then resume pantoprazole 40 mg daily.  GERD diet Follow up in 1 year.     Brooke Bonito, MSN, FNP-BC, AGACNP-BC [facility]

## 2023-05-26 NOTE — Progress Notes (Signed)
Histology and Location of Primary Cancer: Prostate Ca (2000)(biochemical recurrent)  PSA  4.13 on 04/2023 PSA  0.04 on 08/2015 PSA  0.04 on 04/2015 PSA  0.04 on 12/2014 PSA  0.06 on 08/2014 PSA  0.07 on 03/2014 PSA  0.17 on 11/2013 PSA 10.90 on 07/2013 PSA  8.23 on 03/2013 PSA  5.33 on 10/2012 PSA  3.92 on 06/2012  Prostatectomy (11/2025) by Dr. Darvin Neighbours  Sites of Visceral and Bony Metastatic Disease: Spine, sacrum, and pelvis  Location(s) of Symptomatic Metastases: Spine, sacrum, and pelvis  05/12/2023 Dr. Geanie Berlin NM ([facility]) Skull to Mid Thigh CLINICAL DATA:  Prostate cancer with bone metastasis. Radiation therapy. Surgery in 2007.  IMPRESSION: 1. Progression of abdominal nodal metastasis. 2. Small mediastinal nodes have enlarged and demonstrate new mild tracer affinity, suspicious for early metastasis. 3. Previously described left-sided pulmonary nodules are no longer identified on this nondedicated, minimally motion degraded exam. 4. Incidental findings, including: Coronary artery atherosclerosis. Aortic Atherosclerosis (ICD10-I70.0). Emphysema (ICD10-J43.9).   06/27/2022 Dr. Heloise Purpura NM PET ([facility]) Skull to Mid Thigh CLINICAL DATA:  Prostate cancer, status post prostatectomy 16 years ago. PSA of 1.1.  IMPRESSION: 1. Tracer avid abdominal nodes, consistent with metastatic disease. 2. Possible left-sided pulmonary nodules, suboptimally evaluated on motion degraded CT imaging. Consider dedicated chest CT. 3. Incidental findings, including: Aortic atherosclerosis (ICD10-I70.0), coronary artery atherosclerosis and emphysema (ICD10-J43.9). Esophageal air fluid level suggests dysmotility or gastroesophageal reflux.  Past/Anticipated chemotherapy by medical oncology, if any:   04/30/2023 Dr. Geanie Berlin   Pain on a scale of 0-10 is:  4/10 lower back  IPSS: 13   If Spine Met(s), symptoms, if any, include: Bowel/Bladder retention or incontinence (please  describe): No Numbness or weakness in extremities (please describe): no Current Decadron regimen, if applicable: No  Ambulatory status? Walker? Wheelchair?:  Independent  SAFETY ISSUES: Prior radiation? Yes, prostate 68.4 Gy (Dr. Kathrynn Running April 2010) Pacemaker/ICD? No Possible current pregnancy? Male Is the patient on methotrexate? No  Current Complaints / other details:

## 2023-06-01 ENCOUNTER — Encounter: Payer: Self-pay | Admitting: Radiation Oncology

## 2023-06-01 ENCOUNTER — Ambulatory Visit
Admission: RE | Admit: 2023-06-01 | Discharge: 2023-06-01 | Disposition: A | Discharge status: Home / Self Care | Payer: Medicare Other | Source: Ambulatory Visit | Attending: Radiation Oncology | Admitting: Radiation Oncology

## 2023-06-01 VITALS — BP 140/82 | HR 83 | Temp 97.8°F | Resp 20 | Ht 74.0 in | Wt 271.2 lb

## 2023-06-01 DIAGNOSIS — Z791 Long term (current) use of non-steroidal anti-inflammatories (NSAID): Secondary | ICD-10-CM | POA: Diagnosis not present

## 2023-06-01 DIAGNOSIS — C61 Malignant neoplasm of prostate: Secondary | ICD-10-CM | POA: Insufficient documentation

## 2023-06-01 DIAGNOSIS — Z79899 Other long term (current) drug therapy: Secondary | ICD-10-CM | POA: Insufficient documentation

## 2023-06-01 DIAGNOSIS — Z923 Personal history of irradiation: Secondary | ICD-10-CM | POA: Insufficient documentation

## 2023-06-01 DIAGNOSIS — C7951 Secondary malignant neoplasm of bone: Secondary | ICD-10-CM | POA: Diagnosis not present

## 2023-06-01 DIAGNOSIS — J432 Centrilobular emphysema: Secondary | ICD-10-CM | POA: Diagnosis not present

## 2023-06-01 DIAGNOSIS — E7800 Pure hypercholesterolemia, unspecified: Secondary | ICD-10-CM | POA: Insufficient documentation

## 2023-06-01 DIAGNOSIS — Z809 Family history of malignant neoplasm, unspecified: Secondary | ICD-10-CM | POA: Insufficient documentation

## 2023-06-01 DIAGNOSIS — C778 Secondary and unspecified malignant neoplasm of lymph nodes of multiple regions: Secondary | ICD-10-CM | POA: Diagnosis present

## 2023-06-01 DIAGNOSIS — I2510 Atherosclerotic heart disease of native coronary artery without angina pectoris: Secondary | ICD-10-CM | POA: Diagnosis not present

## 2023-06-01 DIAGNOSIS — I10 Essential (primary) hypertension: Secondary | ICD-10-CM | POA: Insufficient documentation

## 2023-06-01 DIAGNOSIS — G4730 Sleep apnea, unspecified: Secondary | ICD-10-CM | POA: Diagnosis not present

## 2023-06-01 DIAGNOSIS — Z87891 Personal history of nicotine dependence: Secondary | ICD-10-CM | POA: Diagnosis not present

## 2023-06-01 NOTE — Progress Notes (Signed)
attenuation correction and anatomic localization. COMPARISON:  06/27/2022 FINDINGS: NECK No radiotracer activity in neck lymph nodes. Incidental CT finding: No cervical adenopathy. CHEST A high left posterior mediastinal node measures 7 mm and a S.U.V. max of 3.6 on 54/4. This measured 5 mm and was not tracer avid previously. A subcarinal node measures 8 mm and a S.U.V. max of 3.4 on 83/3. Compare 7 mm and not tracer avid on the prior. No pulmonary parenchymal tracer affinity. Incidental CT finding: Mild cardiomegaly. Aortic and coronary artery calcification. Centrilobular emphysema. The questioned lingular  and left apical pulmonary nodules are not readily apparent. ABDOMEN/PELVIS Prostate: No focal activity in the prostate bed. Lymph nodes: Porta hepatis nodes of up to 1.7 cm and a S.U.V. max of 36.6 on 114/4. Compare 9 mm and a S.U.V. max of 21.4 on the prior. Left periaortic node measures 1.3 cm and a S.U.V. max of 35.3 on 134/4 versus 6 mm and a S.U.V. max of 4.5 on the prior. New tracer avid abdominal retroperitoneal node in the preaortic space at 4 mm and a S.U.V. max of 6.7 on 143/4. Liver: No evidence of liver metastasis. Incidental CT finding: Aortic atherosclerosis. Penile implant with reservoir in the right anterior pelvis. Prostatectomy. Fat containing left inguinal hernia. SKELETON No focal activity to suggest skeletal metastasis. Lumbar spine fixation. Cervical spine fixation. IMPRESSION: 1. Progression of abdominal nodal metastasis. 2. Small mediastinal nodes have enlarged and demonstrate new mild tracer affinity, suspicious for early metastasis. 3. Previously described left-sided pulmonary nodules are no longer identified on this nondedicated, minimally motion degraded exam. 4. Incidental findings, including: Coronary artery atherosclerosis. Aortic Atherosclerosis (ICD10-I70.0). Emphysema (ICD10-J43.9). Electronically Signed   By: [name] M.D.   On: 05/14/2023 13:39   DG Bone Density  Result Date: 05/14/2023 EXAM: DUAL X-RAY ABSORPTIOMETRY (DXA) FOR BONE MINERAL DENSITY IMPRESSION: [name]: [name] Referring Physician: Melven [name] Birth Date: Aug 07, 1956 [age]       [age] [name] ID: [id number] Height: 71.0 in. Weight: 272.5 lbs. Measured: 05/14/2023 10:20:11 AM (18 SP 3) Sex: Male Ethnicity: White Analyzed: 05/14/2023 10:26:50 AM (18 SP 3) FRAX* Based on femoral neck BMD: DualFemur (Left) 10-year Probability of Fracture Major Osteoporotic Fracture: 4.7 % Hip Fracture:                0.2 % Population:                  [place] (Caucasian) Risk Factors:                Rheumatoid  Arthritis *FRAX is a Armed forces logistics/support/administrative officer of the [employer] for Metabolic Bone Diseases, a World Science writer (WHO) [employer] [phone]). Referring Physician:  Melven [name] Your [name] completed a bone mineral density test using GE Lunar iDXA system (analysis version: 16). Technologist: [name]: [name], [name] [name] ID: [id number] Birth Date: December 09, 1955 Height: 71.0 in. Sex: Male Measured: 05/14/2023 Weight: 272.5 lbs. Indications: Abilfy, Caucasian, COPD, High Risk Medication, Pantoprazole, Plaquenil, Prostate Cancer, Rheumatoid Arthritis, Xtandi Fractures: Treatments: Calcium, Vitamin D ASSESSMENT: The BMD measured at Left Forearm Radius 33% is 0.862 g/cm2 with a T-score of -1.3. This [name] is considered osteopenic according to the World Health Organization [facility]) criteria. The scan qaulity is good. Lumbar Spine excluded due to surgical fusions. Site Region Measured Date Measured Age YA BMD Significant CHANGE T-score DualFemur Total Right 05/14/2023 67.9 -0.1 1.081 g/cm2 Left Forearm Radius 33% 05/14/2023 67.9 -1.3 0.862 g/cm2 DualFemur Total  Radiation Oncology         [phone] ________________________________  Initial outpatient Consultation  [name] MRN: [id number]  Date of Service: 06/01/2023 DOB: 23-Jul-1956  CC:Michael Nevins, MD  Michael Sartorius, MD   [name], MD  DIAGNOSIS: 67 yo gentleman with oligometastatic prostate adenocarcinoma to the mediastinal and abdominal lymph nodes.     ICD-10-CM   1. Prostate cancer metastatic to intraabdominal lymph node (HCC)  C61    C77.2     2. Prostate cancer metastatic to intrathoracic lymph node [facility])  C61    C77.1       HISTORY OF PRESENT ILLNESS: [name] is a 67 y.o. male seen at the request of [name]. In summary, he was diagnosed with prostate cancer and proceeded with prostatectomy in 11/2005 under Dr. Earlene [name]. Pathology showed stage pT3aN0 Gleason 3+4 prostate adenocarcinoma. His PSA rose postoperatively, reaching 0.11 by 07/2008. He was referred to Dr. Kathrynn [name] and completed a course of salvage radiation therapy in 12/2008. His PSA rose again, prompting a restaging CT A/P in 03/2013 showing a possible nodule adjacent to cecum. He was taken for laparoscopy under [name] in 06/2013, and pathology confirmed a solitary intraperitoneal metastasis from prostate cancer. He was subsequently started on ADT, which he received intermittently through 04/2017. His PSA rose off treatment and reached 6.22 in 07/2018. A restaging CT and bone scan revealed multiple sclerotic bone metastases in spine, sacrum, pelvis, right ischium, and multiple ribs. He was started on enzalutamide, and his PSA reached a nadir of <0.2.   More recently, his PSA has risen again to 7.18 in 03/2023. He was referred to [name] on 04/30/23 for discussion of potential systemic treatment. He underwent a restaging PSMA PET scan on 05/12/23 showing: progression of abdominal nodal metastasis; enlargement of small mediastinal nodes with new mild tracer affinity. Given the low  burden of disease, the recommendation was to continue ADT with enzalutamide and consider radiation therapy to the five sites of disease seen on PET scan.   PREVIOUS RADIATION THERAPY: Yes  2010: Prostatic fossa  PAST MEDICAL HISTORY:  Past Medical History:  Diagnosis Date   Anemia    Anxiety    Arthritis    Bladder stone    Cancer (HCC) 2007   prostate CA   COPD (chronic obstructive pulmonary disease) (HCC)    Decreased [name] 08/16/2008   LE doppler - bilateral ABIs normal, bilateral PVRs normal waveform; bilateral common iliac arteries difficult to visualize however velocities suggest less than 50% diameter reduction; bilateral LE normal velocities w/o evidence of diameter reduction   Depression    GERD (gastroesophageal reflux disease)    Hypercholesteremia    Hypertension    Mental disorder    PTSD   Pre-diabetes    Shortness of breath    Sleep apnea    wears CPAP nightly, settings at 14    Tobacco abuse    Urinary incontinence    due to prostate removal      PAST SURGICAL HISTORY: Past Surgical History:  Procedure Laterality Date   ANTERIOR LAT LUMBAR FUSION N/A 11/17/2012   Procedure: XLIF L3 - L4 POSTERIOR L3 -L4 FUSION INSTRUMENTATION/L2 - L3 DECOMPRESSION (REVISION OF POSTERIOR HARDWARE) 1 LEVEL;  Surgeon: Venita Lick, MD;  Location: [facility] OR;  Service: Orthopedics;  Laterality: N/A;  procedure #1: start 0923, end 1057.    BACK SURGERY  2010 / 2014   BLADDER SUSPENSION  Radiation Oncology         [phone] ________________________________  Initial outpatient Consultation  [name] MRN: [id number]  Date of Service: 06/01/2023 DOB: 23-Jul-1956  CC:Michael Nevins, MD  Michael Sartorius, MD   [name], MD  DIAGNOSIS: 67 yo gentleman with oligometastatic prostate adenocarcinoma to the mediastinal and abdominal lymph nodes.     ICD-10-CM   1. Prostate cancer metastatic to intraabdominal lymph node (HCC)  C61    C77.2     2. Prostate cancer metastatic to intrathoracic lymph node [facility])  C61    C77.1       HISTORY OF PRESENT ILLNESS: [name] is a 67 y.o. male seen at the request of [name]. In summary, he was diagnosed with prostate cancer and proceeded with prostatectomy in 11/2005 under Dr. Earlene [name]. Pathology showed stage pT3aN0 Gleason 3+4 prostate adenocarcinoma. His PSA rose postoperatively, reaching 0.11 by 07/2008. He was referred to Dr. Kathrynn [name] and completed a course of salvage radiation therapy in 12/2008. His PSA rose again, prompting a restaging CT A/P in 03/2013 showing a possible nodule adjacent to cecum. He was taken for laparoscopy under [name] in 06/2013, and pathology confirmed a solitary intraperitoneal metastasis from prostate cancer. He was subsequently started on ADT, which he received intermittently through 04/2017. His PSA rose off treatment and reached 6.22 in 07/2018. A restaging CT and bone scan revealed multiple sclerotic bone metastases in spine, sacrum, pelvis, right ischium, and multiple ribs. He was started on enzalutamide, and his PSA reached a nadir of <0.2.   More recently, his PSA has risen again to 7.18 in 03/2023. He was referred to [name] on 04/30/23 for discussion of potential systemic treatment. He underwent a restaging PSMA PET scan on 05/12/23 showing: progression of abdominal nodal metastasis; enlargement of small mediastinal nodes with new mild tracer affinity. Given the low  burden of disease, the recommendation was to continue ADT with enzalutamide and consider radiation therapy to the five sites of disease seen on PET scan.   PREVIOUS RADIATION THERAPY: Yes  2010: Prostatic fossa  PAST MEDICAL HISTORY:  Past Medical History:  Diagnosis Date   Anemia    Anxiety    Arthritis    Bladder stone    Cancer (HCC) 2007   prostate CA   COPD (chronic obstructive pulmonary disease) (HCC)    Decreased [name] 08/16/2008   LE doppler - bilateral ABIs normal, bilateral PVRs normal waveform; bilateral common iliac arteries difficult to visualize however velocities suggest less than 50% diameter reduction; bilateral LE normal velocities w/o evidence of diameter reduction   Depression    GERD (gastroesophageal reflux disease)    Hypercholesteremia    Hypertension    Mental disorder    PTSD   Pre-diabetes    Shortness of breath    Sleep apnea    wears CPAP nightly, settings at 14    Tobacco abuse    Urinary incontinence    due to prostate removal      PAST SURGICAL HISTORY: Past Surgical History:  Procedure Laterality Date   ANTERIOR LAT LUMBAR FUSION N/A 11/17/2012   Procedure: XLIF L3 - L4 POSTERIOR L3 -L4 FUSION INSTRUMENTATION/L2 - L3 DECOMPRESSION (REVISION OF POSTERIOR HARDWARE) 1 LEVEL;  Surgeon: Venita Lick, MD;  Location: [facility] OR;  Service: Orthopedics;  Laterality: N/A;  procedure #1: start 0923, end 1057.    BACK SURGERY  2010 / 2014   BLADDER SUSPENSION  attenuation correction and anatomic localization. COMPARISON:  06/27/2022 FINDINGS: NECK No radiotracer activity in neck lymph nodes. Incidental CT finding: No cervical adenopathy. CHEST A high left posterior mediastinal node measures 7 mm and a S.U.V. max of 3.6 on 54/4. This measured 5 mm and was not tracer avid previously. A subcarinal node measures 8 mm and a S.U.V. max of 3.4 on 83/3. Compare 7 mm and not tracer avid on the prior. No pulmonary parenchymal tracer affinity. Incidental CT finding: Mild cardiomegaly. Aortic and coronary artery calcification. Centrilobular emphysema. The questioned lingular  and left apical pulmonary nodules are not readily apparent. ABDOMEN/PELVIS Prostate: No focal activity in the prostate bed. Lymph nodes: Porta hepatis nodes of up to 1.7 cm and a S.U.V. max of 36.6 on 114/4. Compare 9 mm and a S.U.V. max of 21.4 on the prior. Left periaortic node measures 1.3 cm and a S.U.V. max of 35.3 on 134/4 versus 6 mm and a S.U.V. max of 4.5 on the prior. New tracer avid abdominal retroperitoneal node in the preaortic space at 4 mm and a S.U.V. max of 6.7 on 143/4. Liver: No evidence of liver metastasis. Incidental CT finding: Aortic atherosclerosis. Penile implant with reservoir in the right anterior pelvis. Prostatectomy. Fat containing left inguinal hernia. SKELETON No focal activity to suggest skeletal metastasis. Lumbar spine fixation. Cervical spine fixation. IMPRESSION: 1. Progression of abdominal nodal metastasis. 2. Small mediastinal nodes have enlarged and demonstrate new mild tracer affinity, suspicious for early metastasis. 3. Previously described left-sided pulmonary nodules are no longer identified on this nondedicated, minimally motion degraded exam. 4. Incidental findings, including: Coronary artery atherosclerosis. Aortic Atherosclerosis (ICD10-I70.0). Emphysema (ICD10-J43.9). Electronically Signed   By: [name] M.D.   On: 05/14/2023 13:39   DG Bone Density  Result Date: 05/14/2023 EXAM: DUAL X-RAY ABSORPTIOMETRY (DXA) FOR BONE MINERAL DENSITY IMPRESSION: [name]: [name] Referring Physician: Melven [name] Birth Date: Aug 07, 1956 [age]:       [age] [name] ID: [id number] Height: 71.0 in. Weight: 272.5 lbs. Measured: 05/14/2023 10:20:11 AM (18 SP 3) Sex: Male Ethnicity: White Analyzed: 05/14/2023 10:26:50 AM (18 SP 3) FRAX* Based on femoral neck BMD: DualFemur (Left) 10-year Probability of Fracture Major Osteoporotic Fracture: 4.7 % Hip Fracture:                0.2 % Population:                  [place] (Caucasian) Risk Factors:                Rheumatoid  Arthritis *FRAX is a Armed forces logistics/support/administrative officer of the [employer] for Metabolic Bone Diseases, a World Science writer (WHO) [employer] [phone]). Referring Physician:  Melven [name] Your [name] completed a bone mineral density test using GE Lunar iDXA system (analysis version: 16). Technologist: [name]: [name], [name] [name] ID: [id number] Birth Date: December 09, 1955 Height: 71.0 in. Sex: Male Measured: 05/14/2023 Weight: 272.5 lbs. Indications: Abilfy, Caucasian, COPD, High Risk Medication, Pantoprazole, Plaquenil, Prostate Cancer, Rheumatoid Arthritis, Xtandi Fractures: Treatments: Calcium, Vitamin D ASSESSMENT: The BMD measured at Left Forearm Radius 33% is 0.862 g/cm2 with a T-score of -1.3. This [name] is considered osteopenic according to the World Health Organization [facility]) criteria. The scan qaulity is good. Lumbar Spine excluded due to surgical fusions. Site Region Measured Date Measured Age YA BMD Significant CHANGE T-score DualFemur Total Right 05/14/2023 67.9 -0.1 1.081 g/cm2 Left Forearm Radius 33% 05/14/2023 67.9 -1.3 0.862 g/cm2 DualFemur Total  attenuation correction and anatomic localization. COMPARISON:  06/27/2022 FINDINGS: NECK No radiotracer activity in neck lymph nodes. Incidental CT finding: No cervical adenopathy. CHEST A high left posterior mediastinal node measures 7 mm and a S.U.V. max of 3.6 on 54/4. This measured 5 mm and was not tracer avid previously. A subcarinal node measures 8 mm and a S.U.V. max of 3.4 on 83/3. Compare 7 mm and not tracer avid on the prior. No pulmonary parenchymal tracer affinity. Incidental CT finding: Mild cardiomegaly. Aortic and coronary artery calcification. Centrilobular emphysema. The questioned lingular  and left apical pulmonary nodules are not readily apparent. ABDOMEN/PELVIS Prostate: No focal activity in the prostate bed. Lymph nodes: Porta hepatis nodes of up to 1.7 cm and a S.U.V. max of 36.6 on 114/4. Compare 9 mm and a S.U.V. max of 21.4 on the prior. Left periaortic node measures 1.3 cm and a S.U.V. max of 35.3 on 134/4 versus 6 mm and a S.U.V. max of 4.5 on the prior. New tracer avid abdominal retroperitoneal node in the preaortic space at 4 mm and a S.U.V. max of 6.7 on 143/4. Liver: No evidence of liver metastasis. Incidental CT finding: Aortic atherosclerosis. Penile implant with reservoir in the right anterior pelvis. Prostatectomy. Fat containing left inguinal hernia. SKELETON No focal activity to suggest skeletal metastasis. Lumbar spine fixation. Cervical spine fixation. IMPRESSION: 1. Progression of abdominal nodal metastasis. 2. Small mediastinal nodes have enlarged and demonstrate new mild tracer affinity, suspicious for early metastasis. 3. Previously described left-sided pulmonary nodules are no longer identified on this nondedicated, minimally motion degraded exam. 4. Incidental findings, including: Coronary artery atherosclerosis. Aortic Atherosclerosis (ICD10-I70.0). Emphysema (ICD10-J43.9). Electronically Signed   By: [name] M.D.   On: 05/14/2023 13:39   DG Bone Density  Result Date: 05/14/2023 EXAM: DUAL X-RAY ABSORPTIOMETRY (DXA) FOR BONE MINERAL DENSITY IMPRESSION: [name]: [name] Referring Physician: Melven [name] Birth Date: Aug 07, 1956 [age]       [age] [name] ID: [id number] Height: 71.0 in. Weight: 272.5 lbs. Measured: 05/14/2023 10:20:11 AM (18 SP 3) Sex: Male Ethnicity: White Analyzed: 05/14/2023 10:26:50 AM (18 SP 3) FRAX* Based on femoral neck BMD: DualFemur (Left) 10-year Probability of Fracture Major Osteoporotic Fracture: 4.7 % Hip Fracture:                0.2 % Population:                  [place] (Caucasian) Risk Factors:                Rheumatoid  Arthritis *FRAX is a Armed forces logistics/support/administrative officer of the [employer] for Metabolic Bone Diseases, a World Science writer (WHO) [employer] [phone]). Referring Physician:  Melven [name] Your [name] completed a bone mineral density test using GE Lunar iDXA system (analysis version: 16). Technologist: [name]: [name], [name] [name] ID: [id number] Birth Date: December 09, 1955 Height: 71.0 in. Sex: Male Measured: 05/14/2023 Weight: 272.5 lbs. Indications: Abilfy, Caucasian, COPD, High Risk Medication, Pantoprazole, Plaquenil, Prostate Cancer, Rheumatoid Arthritis, Xtandi Fractures: Treatments: Calcium, Vitamin D ASSESSMENT: The BMD measured at Left Forearm Radius 33% is 0.862 g/cm2 with a T-score of -1.3. This [name] is considered osteopenic according to the World Health Organization [facility]) criteria. The scan qaulity is good. Lumbar Spine excluded due to surgical fusions. Site Region Measured Date Measured Age YA BMD Significant CHANGE T-score DualFemur Total Right 05/14/2023 67.9 -0.1 1.081 g/cm2 Left Forearm Radius 33% 05/14/2023 67.9 -1.3 0.862 g/cm2 DualFemur Total  Radiation Oncology         [phone] ________________________________  Initial outpatient Consultation  [name] MRN: [id number]  Date of Service: 06/01/2023 DOB: 23-Jul-1956  CC:Michael Nevins, MD  Michael Sartorius, MD   [name], MD  DIAGNOSIS: 67 yo gentleman with oligometastatic prostate adenocarcinoma to the mediastinal and abdominal lymph nodes.     ICD-10-CM   1. Prostate cancer metastatic to intraabdominal lymph node (HCC)  C61    C77.2     2. Prostate cancer metastatic to intrathoracic lymph node [facility])  C61    C77.1       HISTORY OF PRESENT ILLNESS: [name] is a 67 y.o. male seen at the request of [name]. In summary, he was diagnosed with prostate cancer and proceeded with prostatectomy in 11/2005 under Dr. Earlene [name]. Pathology showed stage pT3aN0 Gleason 3+4 prostate adenocarcinoma. His PSA rose postoperatively, reaching 0.11 by 07/2008. He was referred to Dr. Kathrynn [name] and completed a course of salvage radiation therapy in 12/2008. His PSA rose again, prompting a restaging CT A/P in 03/2013 showing a possible nodule adjacent to cecum. He was taken for laparoscopy under [name] in 06/2013, and pathology confirmed a solitary intraperitoneal metastasis from prostate cancer. He was subsequently started on ADT, which he received intermittently through 04/2017. His PSA rose off treatment and reached 6.22 in 07/2018. A restaging CT and bone scan revealed multiple sclerotic bone metastases in spine, sacrum, pelvis, right ischium, and multiple ribs. He was started on enzalutamide, and his PSA reached a nadir of <0.2.   More recently, his PSA has risen again to 7.18 in 03/2023. He was referred to [name] on 04/30/23 for discussion of potential systemic treatment. He underwent a restaging PSMA PET scan on 05/12/23 showing: progression of abdominal nodal metastasis; enlargement of small mediastinal nodes with new mild tracer affinity. Given the low  burden of disease, the recommendation was to continue ADT with enzalutamide and consider radiation therapy to the five sites of disease seen on PET scan.   PREVIOUS RADIATION THERAPY: Yes  2010: Prostatic fossa  PAST MEDICAL HISTORY:  Past Medical History:  Diagnosis Date   Anemia    Anxiety    Arthritis    Bladder stone    Cancer (HCC) 2007   prostate CA   COPD (chronic obstructive pulmonary disease) (HCC)    Decreased [name] 08/16/2008   LE doppler - bilateral ABIs normal, bilateral PVRs normal waveform; bilateral common iliac arteries difficult to visualize however velocities suggest less than 50% diameter reduction; bilateral LE normal velocities w/o evidence of diameter reduction   Depression    GERD (gastroesophageal reflux disease)    Hypercholesteremia    Hypertension    Mental disorder    PTSD   Pre-diabetes    Shortness of breath    Sleep apnea    wears CPAP nightly, settings at 14    Tobacco abuse    Urinary incontinence    due to prostate removal      PAST SURGICAL HISTORY: Past Surgical History:  Procedure Laterality Date   ANTERIOR LAT LUMBAR FUSION N/A 11/17/2012   Procedure: XLIF L3 - L4 POSTERIOR L3 -L4 FUSION INSTRUMENTATION/L2 - L3 DECOMPRESSION (REVISION OF POSTERIOR HARDWARE) 1 LEVEL;  Surgeon: Venita Lick, MD;  Location: [facility] OR;  Service: Orthopedics;  Laterality: N/A;  procedure #1: start 0923, end 1057.    BACK SURGERY  2010 / 2014   BLADDER SUSPENSION

## 2023-06-02 ENCOUNTER — Ambulatory Visit
Admission: RE | Admit: 2023-06-02 | Discharge: 2023-06-02 | Disposition: A | Discharge status: Home / Self Care | Payer: Medicare Other | Source: Ambulatory Visit | Attending: Radiation Oncology | Admitting: Radiation Oncology

## 2023-06-02 DIAGNOSIS — Z510 Encounter for antineoplastic radiation therapy: Secondary | ICD-10-CM | POA: Insufficient documentation

## 2023-06-02 DIAGNOSIS — C778 Secondary and unspecified malignant neoplasm of lymph nodes of multiple regions: Secondary | ICD-10-CM | POA: Diagnosis not present

## 2023-06-02 DIAGNOSIS — C7951 Secondary malignant neoplasm of bone: Secondary | ICD-10-CM | POA: Diagnosis not present

## 2023-06-02 DIAGNOSIS — C61 Malignant neoplasm of prostate: Secondary | ICD-10-CM | POA: Insufficient documentation

## 2023-06-02 NOTE — Progress Notes (Signed)
Radiation Oncology         [phone]) [phone] ________________________________  [name] MRN: [id number]  Date: 06/02/2023  DOB: 07-08-1956  ULTRAHYPOFRACTIONATED RADIOTHERAPY  SIMULATION AND TREATMENT PLANNING NOTE    ICD-10-CM   1. Prostate cancer metastatic to intrathoracic lymph node (HCC)  C61    C77.1       DIAGNOSIS:  67 yo gentleman with oligometastatic prostate adenocarcinoma to mediastinal and abdominal lymph nodes.   NARRATIVE:  The patient was brought to the CT Simulation planning suite.  Identity was confirmed.  All relevant records and images related to the planned course of therapy were reviewed.  The patient freely provided informed written consent to proceed with treatment after reviewing the details related to the planned course of therapy. The consent form was witnessed and verified by the simulation staff.  Then, the patient was set-up in a stable reproducible  supine position for radiation therapy.  A BodyFix immobilization pillow was fabricated for reproducible positioning.  Surface markings were placed.  The CT images were loaded into the planning software.  The gross target volumes (GTV) and planning target volumes (PTV) were delinieated, and avoidance structures were contoured.  Treatment planning then occurred.  The radiation prescription was entered and confirmed.  A total of two complex treatment devices were fabricated in the form of the BodyFix immobilization pillow and a neck accuform cushion.  I have requested : 3D Simulation  I have requested a DVH of the following structures: targets and all normal structures near the target including lungs, heart, spinal cord, kidneys and others as noted on the radiation plan to maintain doses in adherence with established limits  SPECIAL TREATMENT PROCEDURE:  The planned course of therapy using radiation constitutes a special treatment procedure. Special care is required in the management of this patient for the  following reasons. High dose per fraction requiring special monitoring for increased toxicities of treatment including daily imaging..  The special nature of the planned course of radiotherapy will require increased physician supervision and oversight to ensure patient's safety with optimal treatment outcomes.    This requires extended time and effort.    PLAN:  The patient will receive 50 Gy in 10 fractions to PET Avid nodes and 30 Gy in 10 fractions to involved nodal echelons.  ________________________________  Artist Pais Kathrynn Running, M.D.

## 2023-06-16 ENCOUNTER — Ambulatory Visit: Payer: Medicare Other | Admitting: Radiation Oncology

## 2023-06-19 DIAGNOSIS — Z510 Encounter for antineoplastic radiation therapy: Secondary | ICD-10-CM | POA: Diagnosis not present

## 2023-06-22 ENCOUNTER — Other Ambulatory Visit: Payer: Self-pay

## 2023-06-22 ENCOUNTER — Ambulatory Visit
Admission: RE | Admit: 2023-06-22 | Discharge: 2023-06-22 | Disposition: A | Discharge status: Home / Self Care | Payer: Medicare Other | Source: Ambulatory Visit | Attending: Radiation Oncology | Admitting: Radiation Oncology

## 2023-06-22 DIAGNOSIS — Z510 Encounter for antineoplastic radiation therapy: Secondary | ICD-10-CM | POA: Diagnosis not present

## 2023-06-22 LAB — RAD ONC ARIA SESSION SUMMARY
Course Elapsed Days: 0
Plan Fractions Treated to Date: 1
Plan Fractions Treated to Date: 1
Plan Prescribed Dose Per Fraction: 5 Gy
Plan Prescribed Dose Per Fraction: 5 Gy
Plan Total Fractions Prescribed: 10
Plan Total Fractions Prescribed: 10
Plan Total Prescribed Dose: 50 Gy
Plan Total Prescribed Dose: 50 Gy
Reference Point Dosage Given to Date: 5 Gy
Reference Point Dosage Given to Date: 5 Gy
Reference Point Session Dosage Given: 1.9164 Gy
Reference Point Session Dosage Given: 5 Gy
Session Number: 1

## 2023-06-23 ENCOUNTER — Ambulatory Visit
Admission: RE | Admit: 2023-06-23 | Discharge: 2023-06-23 | Disposition: A | Discharge status: Home / Self Care | Payer: Medicare Other | Source: Ambulatory Visit | Attending: Radiation Oncology | Admitting: Radiation Oncology

## 2023-06-23 ENCOUNTER — Other Ambulatory Visit: Payer: Self-pay

## 2023-06-23 DIAGNOSIS — Z510 Encounter for antineoplastic radiation therapy: Secondary | ICD-10-CM | POA: Diagnosis not present

## 2023-06-23 LAB — RAD ONC ARIA SESSION SUMMARY
Course Elapsed Days: 1
Plan Fractions Treated to Date: 1
Plan Fractions Treated to Date: 2
Plan Prescribed Dose Per Fraction: 5 Gy
Plan Prescribed Dose Per Fraction: 5 Gy
Plan Total Fractions Prescribed: 10
Plan Total Fractions Prescribed: 9
Plan Total Prescribed Dose: 45 Gy
Plan Total Prescribed Dose: 50 Gy
Reference Point Dosage Given to Date: 10 Gy
Reference Point Dosage Given to Date: 10 Gy
Reference Point Session Dosage Given: 5 Gy
Reference Point Session Dosage Given: 5 Gy
Session Number: 2

## 2023-06-24 ENCOUNTER — Other Ambulatory Visit: Payer: Self-pay

## 2023-06-24 ENCOUNTER — Ambulatory Visit
Admission: RE | Admit: 2023-06-24 | Discharge: 2023-06-24 | Disposition: A | Discharge status: Home / Self Care | Payer: Medicare Other | Source: Ambulatory Visit | Attending: Radiation Oncology | Admitting: Radiation Oncology

## 2023-06-24 DIAGNOSIS — Z510 Encounter for antineoplastic radiation therapy: Secondary | ICD-10-CM | POA: Diagnosis not present

## 2023-06-24 LAB — RAD ONC ARIA SESSION SUMMARY
Course Elapsed Days: 2
Plan Fractions Treated to Date: 2
Plan Fractions Treated to Date: 3
Plan Prescribed Dose Per Fraction: 5 Gy
Plan Prescribed Dose Per Fraction: 5 Gy
Plan Total Fractions Prescribed: 10
Plan Total Fractions Prescribed: 9
Plan Total Prescribed Dose: 45 Gy
Plan Total Prescribed Dose: 50 Gy
Reference Point Dosage Given to Date: 15 Gy
Reference Point Dosage Given to Date: 15 Gy
Reference Point Session Dosage Given: 5 Gy
Reference Point Session Dosage Given: 5 Gy
Session Number: 3

## 2023-06-25 ENCOUNTER — Ambulatory Visit
Admission: RE | Admit: 2023-06-25 | Discharge: 2023-06-25 | Discharge status: Home / Self Care | Payer: Medicare Other | Source: Ambulatory Visit | Attending: Radiation Oncology

## 2023-06-25 ENCOUNTER — Other Ambulatory Visit: Payer: Self-pay

## 2023-06-25 DIAGNOSIS — Z510 Encounter for antineoplastic radiation therapy: Secondary | ICD-10-CM | POA: Diagnosis not present

## 2023-06-25 LAB — RAD ONC ARIA SESSION SUMMARY
Course Elapsed Days: 3
Plan Fractions Treated to Date: 3
Plan Fractions Treated to Date: 4
Plan Prescribed Dose Per Fraction: 5 Gy
Plan Prescribed Dose Per Fraction: 5 Gy
Plan Total Fractions Prescribed: 10
Plan Total Fractions Prescribed: 9
Plan Total Prescribed Dose: 45 Gy
Plan Total Prescribed Dose: 50 Gy
Reference Point Dosage Given to Date: 20 Gy
Reference Point Dosage Given to Date: 20 Gy
Reference Point Session Dosage Given: 5 Gy
Reference Point Session Dosage Given: 5 Gy
Session Number: 4

## 2023-06-26 ENCOUNTER — Ambulatory Visit
Admission: RE | Admit: 2023-06-26 | Discharge: 2023-06-26 | Disposition: A | Discharge status: Home / Self Care | Payer: Medicare Other | Source: Ambulatory Visit | Attending: Radiation Oncology | Admitting: Radiation Oncology

## 2023-06-26 ENCOUNTER — Other Ambulatory Visit: Payer: Self-pay

## 2023-06-26 ENCOUNTER — Other Ambulatory Visit: Payer: Self-pay | Admitting: Radiation Oncology

## 2023-06-26 DIAGNOSIS — C7951 Secondary malignant neoplasm of bone: Secondary | ICD-10-CM | POA: Diagnosis not present

## 2023-06-26 DIAGNOSIS — Z510 Encounter for antineoplastic radiation therapy: Secondary | ICD-10-CM | POA: Diagnosis present

## 2023-06-26 DIAGNOSIS — C778 Secondary and unspecified malignant neoplasm of lymph nodes of multiple regions: Secondary | ICD-10-CM | POA: Insufficient documentation

## 2023-06-26 DIAGNOSIS — C61 Malignant neoplasm of prostate: Secondary | ICD-10-CM | POA: Diagnosis not present

## 2023-06-26 LAB — RAD ONC ARIA SESSION SUMMARY
Course Elapsed Days: 4
Plan Fractions Treated to Date: 4
Plan Fractions Treated to Date: 5
Plan Prescribed Dose Per Fraction: 5 Gy
Plan Prescribed Dose Per Fraction: 5 Gy
Plan Total Fractions Prescribed: 10
Plan Total Fractions Prescribed: 9
Plan Total Prescribed Dose: 45 Gy
Plan Total Prescribed Dose: 50 Gy
Reference Point Dosage Given to Date: 25 Gy
Reference Point Dosage Given to Date: 25 Gy
Reference Point Session Dosage Given: 5 Gy
Reference Point Session Dosage Given: 5 Gy
Session Number: 5

## 2023-06-26 MED ORDER — PROCHLORPERAZINE MALEATE 10 MG PO TABS: 10.0000 mg | ORAL_TABLET | Freq: Four times a day (QID) | ORAL | 2 refills | Status: DC | PRN

## 2023-06-29 ENCOUNTER — Ambulatory Visit
Admission: RE | Admit: 2023-06-29 | Discharge: 2023-06-29 | Disposition: A | Discharge status: Home / Self Care | Payer: Medicare Other | Source: Ambulatory Visit | Attending: Radiation Oncology | Admitting: Radiation Oncology

## 2023-06-29 ENCOUNTER — Other Ambulatory Visit: Payer: Self-pay

## 2023-06-29 DIAGNOSIS — Z510 Encounter for antineoplastic radiation therapy: Secondary | ICD-10-CM | POA: Diagnosis not present

## 2023-06-29 LAB — RAD ONC ARIA SESSION SUMMARY
Course Elapsed Days: 7
Plan Fractions Treated to Date: 5
Plan Fractions Treated to Date: 6
Plan Prescribed Dose Per Fraction: 5 Gy
Plan Prescribed Dose Per Fraction: 5 Gy
Plan Total Fractions Prescribed: 10
Plan Total Fractions Prescribed: 9
Plan Total Prescribed Dose: 45 Gy
Plan Total Prescribed Dose: 50 Gy
Reference Point Dosage Given to Date: 30 Gy
Reference Point Dosage Given to Date: 30 Gy
Reference Point Session Dosage Given: 5 Gy
Reference Point Session Dosage Given: 5 Gy
Session Number: 6

## 2023-06-30 ENCOUNTER — Ambulatory Visit
Admission: RE | Admit: 2023-06-30 | Discharge: 2023-06-30 | Disposition: A | Discharge status: Home / Self Care | Payer: Medicare Other | Source: Ambulatory Visit | Attending: Radiation Oncology | Admitting: Radiation Oncology

## 2023-06-30 ENCOUNTER — Other Ambulatory Visit: Payer: Self-pay

## 2023-06-30 DIAGNOSIS — Z510 Encounter for antineoplastic radiation therapy: Secondary | ICD-10-CM | POA: Diagnosis not present

## 2023-06-30 LAB — RAD ONC ARIA SESSION SUMMARY
Course Elapsed Days: 8
Plan Fractions Treated to Date: 6
Plan Fractions Treated to Date: 7
Plan Prescribed Dose Per Fraction: 5 Gy
Plan Prescribed Dose Per Fraction: 5 Gy
Plan Total Fractions Prescribed: 10
Plan Total Fractions Prescribed: 9
Plan Total Prescribed Dose: 45 Gy
Plan Total Prescribed Dose: 50 Gy
Reference Point Dosage Given to Date: 35 Gy
Reference Point Dosage Given to Date: 35 Gy
Reference Point Session Dosage Given: 5 Gy
Reference Point Session Dosage Given: 5 Gy
Session Number: 7

## 2023-07-01 ENCOUNTER — Ambulatory Visit
Admission: RE | Admit: 2023-07-01 | Discharge: 2023-07-01 | Disposition: A | Discharge status: Home / Self Care | Payer: Medicare Other | Source: Ambulatory Visit | Attending: Radiation Oncology | Admitting: Radiation Oncology

## 2023-07-01 ENCOUNTER — Other Ambulatory Visit: Payer: Self-pay

## 2023-07-01 DIAGNOSIS — Z510 Encounter for antineoplastic radiation therapy: Secondary | ICD-10-CM | POA: Diagnosis not present

## 2023-07-01 LAB — RAD ONC ARIA SESSION SUMMARY
Course Elapsed Days: 9
Plan Fractions Treated to Date: 7
Plan Fractions Treated to Date: 8
Plan Prescribed Dose Per Fraction: 5 Gy
Plan Prescribed Dose Per Fraction: 5 Gy
Plan Total Fractions Prescribed: 10
Plan Total Fractions Prescribed: 9
Plan Total Prescribed Dose: 45 Gy
Plan Total Prescribed Dose: 50 Gy
Reference Point Dosage Given to Date: 40 Gy
Reference Point Dosage Given to Date: 40 Gy
Reference Point Session Dosage Given: 5 Gy
Reference Point Session Dosage Given: 5 Gy
Session Number: 8

## 2023-07-02 ENCOUNTER — Other Ambulatory Visit: Payer: Self-pay

## 2023-07-02 ENCOUNTER — Ambulatory Visit
Admission: RE | Admit: 2023-07-02 | Discharge: 2023-07-02 | Disposition: A | Discharge status: Home / Self Care | Payer: Medicare Other | Source: Ambulatory Visit | Attending: Radiation Oncology | Admitting: Radiation Oncology

## 2023-07-02 DIAGNOSIS — Z510 Encounter for antineoplastic radiation therapy: Secondary | ICD-10-CM | POA: Diagnosis not present

## 2023-07-02 LAB — RAD ONC ARIA SESSION SUMMARY
Course Elapsed Days: 10
Plan Fractions Treated to Date: 8
Plan Fractions Treated to Date: 9
Plan Prescribed Dose Per Fraction: 5 Gy
Plan Prescribed Dose Per Fraction: 5 Gy
Plan Total Fractions Prescribed: 10
Plan Total Fractions Prescribed: 9
Plan Total Prescribed Dose: 45 Gy
Plan Total Prescribed Dose: 50 Gy
Reference Point Dosage Given to Date: 45 Gy
Reference Point Dosage Given to Date: 45 Gy
Reference Point Session Dosage Given: 5 Gy
Reference Point Session Dosage Given: 5 Gy
Session Number: 9

## 2023-07-03 ENCOUNTER — Ambulatory Visit
Admission: RE | Admit: 2023-07-03 | Discharge: 2023-07-03 | Disposition: A | Discharge status: Home / Self Care | Payer: Medicare Other | Source: Ambulatory Visit | Attending: Radiation Oncology | Admitting: Radiation Oncology

## 2023-07-03 ENCOUNTER — Other Ambulatory Visit: Payer: Self-pay

## 2023-07-03 DIAGNOSIS — C61 Malignant neoplasm of prostate: Secondary | ICD-10-CM

## 2023-07-03 DIAGNOSIS — Z510 Encounter for antineoplastic radiation therapy: Secondary | ICD-10-CM | POA: Diagnosis not present

## 2023-07-03 LAB — RAD ONC ARIA SESSION SUMMARY
Course Elapsed Days: 11
Plan Fractions Treated to Date: 10
Plan Fractions Treated to Date: 9
Plan Prescribed Dose Per Fraction: 5 Gy
Plan Prescribed Dose Per Fraction: 5 Gy
Plan Total Fractions Prescribed: 10
Plan Total Fractions Prescribed: 9
Plan Total Prescribed Dose: 45 Gy
Plan Total Prescribed Dose: 50 Gy
Reference Point Dosage Given to Date: 50 Gy
Reference Point Dosage Given to Date: 50 Gy
Reference Point Session Dosage Given: 5 Gy
Reference Point Session Dosage Given: 5 Gy
Session Number: 10

## 2023-07-06 NOTE — Radiation Completion Notes (Addendum)
  Radiation Oncology         [phone] ________________________________  [name] MRN: [id number]  Date: 07/03/2023  DOB: Aug 03, 1956  Referring Physician: PAULETTA CHIHUAHUA, M.D. Date of Service: 2023-07-06 Radiation Oncologist: Adina Barge, M.D. [facility]     RADIATION ONCOLOGY END OF TREATMENT NOTE     Diagnosis: 67 yo gentleman with oligometastatic prostate adenocarcinoma to the mediastinal and abdominal lymph nodes.   Intent: Curative     ==========DELIVERED PLANS==========  First Treatment Date: 2023-06-22 - Last Treatment Date: 2023-07-03   Plan Name: Lung_UHRT Site: Mediastinum Technique: IMRT Mode: Photon Dose Per Fraction: 5 Gy Prescribed Dose (Delivered / Prescribed): 5 Gy / 50 Gy Prescribed Fxs (Delivered / Prescribed): 1 / 10   Plan Name: LungUHRT_New Site: Mediastinum Technique: IMRT Mode: Photon Dose Per Fraction: 5 Gy Prescribed Dose (Delivered / Prescribed): 45 Gy / 45 Gy Prescribed Fxs (Delivered / Prescribed): 9 / 9   Plan Name: Abd_UHRT Site: Abdomen Technique: IMRT Mode: Photon Dose Per Fraction: 5 Gy Prescribed Dose (Delivered / Prescribed): 50 Gy / 50 Gy Prescribed Fxs (Delivered / Prescribed): 10 / 10     ==========ON TREATMENT VISIT DATES========== 2023-06-26, 2023-07-03   See weekly On Treatment Notes in Epic for details.  He tolerated the radiation treatments relatively well with only modest fatigue.  The patient will receive a call in about one month from the radiation oncology department. He will continue follow up with Dr. CHIHUAHUA as well.  ------------------------------------------------   Donnice Barge, MD [facility]  Radiation Oncology Direct Dial: [phone]  Fax: [phone] [email or web address]  Skype  LinkedIn

## 2023-07-07 ENCOUNTER — Other Ambulatory Visit: Payer: Self-pay | Admitting: Urology

## 2023-07-07 ENCOUNTER — Telehealth: Payer: Self-pay

## 2023-07-07 MED ORDER — SUCRALFATE 1 G PO TABS: 1.0000 g | ORAL_TABLET | Freq: Three times a day (TID) | ORAL | 1 refills | Status: DC

## 2023-07-07 NOTE — Telephone Encounter (Signed)
RN called patient per Marcello Fennel, PA-C to inform him of medication prescribed Carafate will be sent into his pharmacy [facility]  Directions to crush pill and mix with 4 oz. of water and swallow.  RN advised patient to increase water intake as to prevent dehydration and eat soft foods, try supplement drink (Boost [place]) to help with nourishment regularly until able to eat solid foods again.  He was advised if develops signs of dehydration (lightheadedness, dizziness, and severe fatigue)to see symptom management for evaluation or ED if after hours for IV fluids.  Patient was appreciative of the call and verbalized understanding.

## 2023-07-07 NOTE — Telephone Encounter (Signed)
[name] completed 10 fractions of UHRT to the intraabdominal and mediastinal lymph nodes on 07/03/2023.  He called with side effects of esophagitis difficulty swallowing and sharp pain with swallowing 6/10.  Reports a white film on tongue possible thrush and acid reflux discomfort.  Hasn't been able to eat or swallow much past couple days.  RN suggested some medications that may be prescribed like Carafate, Hycet, Diflucan, or magic mouthwash.  [name], PA-C was made aware and once medication is prescribed RN will call patient.

## 2023-07-08 ENCOUNTER — Telehealth: Payer: Self-pay

## 2023-07-08 NOTE — Telephone Encounter (Signed)
Patient called wasn't able to pick up prescription due to misunderstanding with pharmacy about what medication he had available.  RN called [facility] pharmacy to make sure medication was available for pick up and pharmacist reply that it was.  RN to call patient back and inform him that medication is filled and ready for pick up anytime to go and get medication to start today.

## 2023-07-10 ENCOUNTER — Other Ambulatory Visit: Payer: Self-pay | Admitting: Urology

## 2023-07-10 ENCOUNTER — Other Ambulatory Visit: Payer: Self-pay | Admitting: Radiation Oncology

## 2023-07-10 ENCOUNTER — Telehealth: Payer: Self-pay

## 2023-07-10 MED ORDER — HYDROCODONE-ACETAMINOPHEN 7.5-325 MG/15ML PO SOLN: 10.0000 mL | Freq: Four times a day (QID) | ORAL | 0 refills | Status: AC | PRN

## 2023-07-10 MED ORDER — HYDROCODONE-ACETAMINOPHEN 7.5-325 MG/15ML PO SOLN: 10.0000 mL | Freq: Four times a day (QID) | ORAL | 0 refills | Status: DC | PRN

## 2023-07-10 NOTE — Telephone Encounter (Signed)
[name] completed 10 fractions of UHRT to the intraabdominal and mediastinal lymph nodes on 07/03/2023.  [name] called back after only trying Carafate for esophagitis for day with continue complaints of sore throat 8/10 sharp pain, dysphagia, and now diarrhea.  RN told him that it will take couple weeks for treated area to heal to continue taking the Carafate as directed. And to take Imodium AD for the diarrhea that he can take up to 8 tablets in 24 hours.  [name], PA-C has sent script in for hycet to take for pain.  He was advised to increase his Protonix to BID instead of daily for GERD which is aggravating the healing of the esophagus.  He was told to call us back on Monday if pain is persistent and not improving.  Consideration for prescribing diflucan at that point for possible candidal esophagitis.  [name] verbalized understanding and that he would try but requested magic mouthwash per a retire nurse friend who thinks that might be helpful.  [name],PA-C was made aware of this request.

## 2023-07-30 ENCOUNTER — Other Ambulatory Visit: Payer: Self-pay

## 2023-07-30 ENCOUNTER — Telehealth: Payer: Self-pay

## 2023-07-30 MED ORDER — HYDROCODONE-ACETAMINOPHEN 7.5-325 MG/15ML PO SOLN: 10.0000 mL | Freq: Four times a day (QID) | ORAL | 0 refills | Status: DC | PRN

## 2023-07-30 MED ORDER — LIDOCAINE VISCOUS HCL 2 % MT SOLN: OROMUCOSAL | 0 refills | Status: DC

## 2023-07-30 NOTE — Telephone Encounter (Signed)
TC from pt to report that he is having esophageal and stomach pain after being finished w/ radiation treatments (last one in mid-November). He reports that Dr. Kathrynn Running prescribed Carafate and Hycet, but he needs these refilled and is unable to get in touch with Dr. Kathrynn Running. He is interested in trying magic mouthwash, stating a doctor had mentioned this in the past. Pt also reports problems w/ taste. Discussed all the above w/ Dr. Cherly Hensen. He states he has ordered Hycet and lidocaine oral solution (to combine w/ Maalox), and he wants to f/u with pt on 08/24/23. Notified pt of Rx's and f/u visit. He verbalizes understanding, and states he cannot see Dr. Cherly Hensen until 08/31/23 due to being out of town.

## 2023-07-30 NOTE — Progress Notes (Signed)
Refilled [facility]et [facility] his [facility].  Prescribed lidocaine viscous solution to [facility] pharmacy.  Direction: Mix 1 to 1 ratio with over-the-counter Maalox to use for mouth pain/sores, use about 15 mL every 4 hours as needed.  Please schedule follow up with me on 10/30 to see if symptoms improved for follow up.

## 2023-07-30 NOTE — Telephone Encounter (Signed)
Patient is aware of scheduled appointment times/dates for follow up

## 2023-08-11 ENCOUNTER — Ambulatory Visit: Payer: Medicare Other | Attending: Cardiovascular Disease | Admitting: Cardiovascular Disease

## 2023-08-11 ENCOUNTER — Encounter: Payer: Self-pay | Admitting: Cardiovascular Disease

## 2023-08-11 VITALS — BP 122/70 | HR 81 | Ht 74.0 in | Wt 259.0 lb

## 2023-08-11 DIAGNOSIS — E1169 Type 2 diabetes mellitus with other specified complication: Secondary | ICD-10-CM | POA: Insufficient documentation

## 2023-08-11 DIAGNOSIS — E782 Mixed hyperlipidemia: Secondary | ICD-10-CM | POA: Diagnosis not present

## 2023-08-11 DIAGNOSIS — J4489 Other specified chronic obstructive pulmonary disease: Secondary | ICD-10-CM | POA: Diagnosis not present

## 2023-08-11 DIAGNOSIS — M06049 Rheumatoid arthritis without rheumatoid factor, unspecified hand: Secondary | ICD-10-CM | POA: Insufficient documentation

## 2023-08-11 DIAGNOSIS — G4733 Obstructive sleep apnea (adult) (pediatric): Secondary | ICD-10-CM | POA: Insufficient documentation

## 2023-08-11 DIAGNOSIS — I10 Essential (primary) hypertension: Secondary | ICD-10-CM | POA: Diagnosis not present

## 2023-08-11 DIAGNOSIS — E669 Obesity, unspecified: Secondary | ICD-10-CM | POA: Diagnosis present

## 2023-08-11 DIAGNOSIS — C771 Secondary and unspecified malignant neoplasm of intrathoracic lymph nodes: Secondary | ICD-10-CM | POA: Insufficient documentation

## 2023-08-11 DIAGNOSIS — C61 Malignant neoplasm of prostate: Secondary | ICD-10-CM | POA: Insufficient documentation

## 2023-08-11 NOTE — Progress Notes (Signed)
Cardiology office note   Date:  08/11/2023   ID:  [name], DOB [birth year], MRN [id number] PCP:  [name], MD  Cardiologist:  Xin Klawitter Electrophysiologist:  None   Evaluation Performed:  Follow-Up Visit  Chief Complaint: Dyspnea  History of Present Illness:    [name] is a 67 y.o. male with multiple obesity related complications including type 2 diabetes mellitus, hypercholesterolemia, essential hypertension, OSA,, also with COPD related to previous smoking (quit 2014) and widespread metastatic prostate cancer controlled on antiandrogen therapy for many years, now requiring XRT to mediastinal and abdominal lymph nodes, seronegative rheumatoid arthritis (positive CCP antibodies). Angiography in 2011 showed normal coronaries.  He has aortic atherosclerosis on imaging studies.  He recently completed radiation therapy for metastatic lymphadenopathy and this has caused a lot of problems with fatigue, dysphagia, nausea and worsening cough producing clear mucoid sputum.  Symptoms seem to be improving slowly.  He occasionally has chest pain after a bout of coughing but he does not have any exertional angina.  He denies shortness of breath at rest or with activity.  His joint stiffness and pain has improved after he started treatment with hydroxychloroquine.  He denies dizziness, palpitations, syncope, orthopnea, PND or lower extremity edema.  He has not had any bleeding problems.  Echocardiogram performed last March 15, 2021 showed normal left ventricular systolic and diastolic function, no serious valvular problems.  The right ventricle was also described as normal in size and function.  PAP could not be estimated.  Reports 100% compliance with CPAP and denies daytime hypersomnolence.  He has fair metabolic control with a hemoglobin A1c of 6.3% and LDL cholesterol of 94.  HDL is chronically low at 42 and triglycerides are borderline elevated at 185.  Past Medical History:   Diagnosis Date   Anemia    Anxiety    Arthritis    Bladder stone    Cancer (HCC) 2007   prostate CA   COPD (chronic obstructive pulmonary disease) (HCC)    Decreased pulse 08/16/2008   LE doppler - bilateral ABIs normal, bilateral PVRs normal waveform; bilateral common iliac arteries difficult to visualize however velocities suggest less than 50% diameter reduction; bilateral LE normal velocities w/o evidence of diameter reduction   Depression    GERD (gastroesophageal reflux disease)    Hypercholesteremia    Hypertension    Mental disorder    PTSD   Pre-diabetes    Shortness of breath    Sleep apnea    wears CPAP nightly, settings at 14    Tobacco abuse    Urinary incontinence    due to prostate removal   Past Surgical History:  Procedure Laterality Date   ANTERIOR LAT LUMBAR FUSION N/A 11/17/2012   Procedure: XLIF L3 - L4 POSTERIOR L3 -L4 FUSION INSTRUMENTATION/L2 - L3 DECOMPRESSION (REVISION OF POSTERIOR HARDWARE) 1 LEVEL;  Surgeon: Venita Lick, MD;  Location: [facility] OR;  Service: Orthopedics;  Laterality: N/A;  procedure #1: start 0923, end 1057.    BACK SURGERY  2010 / 2014   BLADDER SUSPENSION     x 2   CARDIAC CATHETERIZATION     no PCI   CARDIOVASCULAR STRESS TEST  08/16/2008   EF 57%; inferior region has diaphragmatic attenuation, otherwise normal perfusion w/o evidence of ischemia or infarct; LV normal in size; no scintographic evidence of inducible ischemia; no ECG changes, negative for ischemia; low risk scan   CERVICAL FUSION     COLON RESECTION N/A 07/12/2013   peritoneal  biopsy only, evidence of metastatic prostate cancer   COLONOSCOPY     COLONOSCOPY N/A 05/27/2013   RMR: Extrinsic mass effect at the level of the appendiceal orifice likely represents an appendiceal or periappendiceal process. Neovascular changes of the rectal mucosa consistent with prior history of radiation treatment. Multiple 3-5 mm polyps removed, several tubular adenomas. Diverticulosis.nex  tcs 2019   COLONOSCOPY WITH PROPOFOL N/A 07/05/2018   Procedure: COLONOSCOPY WITH PROPOFOL;  Surgeon: Corbin Ade, MD;  Location: [facility];  Service: Endoscopy;  Laterality: N/A;  9:30am   COLONOSCOPY WITH PROPOFOL N/A 03/07/2021   Procedure: COLONOSCOPY WITH PROPOFOL;  Surgeon: Corbin Ade, MD;  Location: [facility];  Service: Endoscopy;  Laterality: N/A;  2:30pm   CYSTOSCOPY W/ URETERAL STENT PLACEMENT N/A 10/03/2013   Procedure: CYSTOSCOPY AND REMOVAL OF BLADDER NECK STONE ;  Surgeon: Crecencio Mc, MD;  Location: [facility];  Service: Urology;  Laterality: N/A;  WITH REMOVAL OF BLADDER NECK STONE     ESOPHAGOGASTRODUODENOSCOPY (EGD) WITH ESOPHAGEAL DILATION N/A 05/27/2013   RMR: Mild erosive reflux esophagitis with soft noncritical appearing stricture status post Maloney dilation, antral erosions with reactive gastropathy but no H. pylori   ESOPHAGOGASTRODUODENOSCOPY (EGD) WITH PROPOFOL N/A 07/05/2018   Procedure: ESOPHAGOGASTRODUODENOSCOPY (EGD) WITH PROPOFOL;  Surgeon: Corbin Ade, MD;  [facility]: [facility];  Service: Endoscopy;  Laterality: N/A;   ESOPHAGOGASTRODUODENOSCOPY (EGD) WITH PROPOFOL N/A 02/05/2023   Procedure: ESOPHAGOGASTRODUODENOSCOPY (EGD) WITH PROPOFOL;  Surgeon: Corbin Ade, MD;  Location: [facility];  Service: Endoscopy;  Laterality: N/A;  11:30 am, asa 3   HOLMIUM LASER APPLICATION N/A 10/03/2013   Procedure: HOLMIUM LASER APPLICATION;  Surgeon: Crecencio Mc, MD;  Location: [facility];  Service: Urology;  Laterality: N/A;   MALONEY DILATION N/A 07/05/2018   Procedure: Elease Hashimoto DILATION;  Surgeon: Corbin Ade, MD;  [facility]: [facility];  Service: Endoscopy;  Laterality: N/A;   MALONEY DILATION N/A 02/05/2023   Procedure: Elease Hashimoto DILATION;  Surgeon: Corbin Ade, MD;  [facility]: [facility];  Service: Endoscopy;  Laterality: N/A;   POLYPECTOMY  07/05/2018   Procedure: POLYPECTOMY;  Surgeon: Corbin Ade, MD;  [facility];  Service:  Endoscopy;;  cecal polyp , splenic flexure polyps x4   POLYPECTOMY  03/07/2021   Procedure: POLYPECTOMY;  Surgeon: Corbin Ade, MD;  [facility]: [facility];  Service: Endoscopy;;   PROSTATE SURGERY     TRANSTHORACIC ECHOCARDIOGRAM  08/16/2008   essentially normal for age   URINARY SPHINCTER IMPLANT N/A 01/31/2014   Procedure: IMPLANTATION ARTIFICIAL SPHINCTER  WITH CYSTOSCOPY;  Surgeon: Martina Sinner, MD;  Location: [facility];  Service: Urology;  Laterality: N/A;   WRIST SURGERY Right      Current Meds  Medication Sig   ARIPiprazole (ABILIFY) 10 MG tablet Take 10 mg by mouth at bedtime.   Choline Fenofibrate 135 MG capsule Take 135 mg by mouth at bedtime. trilipix   Coenzyme Q10 (CO Q 10) 100 MG CAPS Take 100 mg by mouth every evening.   desipramine (NOPRAMIN) 100 MG tablet Take 100 mg by mouth at bedtime.    enalapril (VASOTEC) 10 MG tablet Take 10 mg by mouth every morning.    enzalutamide (XTANDI) 40 MG capsule Take 160 mg by mouth daily. Takes 4 capsules daily   ferrous sulfate 325 (65 FE) MG tablet Take 325 mg by mouth daily.    HYDROcodone-acetaminophen (HYCET) 7.5-325 mg/15 ml solution Take 10 mLs by mouth 4 (  four) times daily as needed for moderate pain (pain score 4-6). Please advise on constipation precautions   hydroxychloroquine (PLAQUENIL) 200 MG tablet Take 200 mg by mouth 2 (two) times daily.   Krill Oil 300 MG CAPS Take 300 mg by mouth daily.   lidocaine (XYLOCAINE) 2 % solution Mix 1 to 1 ratio with Maalox to use for mouth pain/sores, use about 15 mL every 4 hours as needed   meloxicam (MOBIC) 7.5 MG tablet Take 7.5 mg by mouth 2 (two) times daily.   Multiple Vitamin (MULTIVITAMIN WITH MINERALS) TABS tablet Take 1 tablet by mouth daily.   pantoprazole (PROTONIX) 20 MG tablet Take 1 tablet (20 mg total) by mouth daily.   pantoprazole (PROTONIX) 40 MG tablet Take 1 tablet (40 mg total) by mouth daily. 30 minutes before breakfast   prochlorperazine (COMPAZINE) 10 MG  tablet Take 1 tablet (10 mg total) by mouth every 6 (six) hours as needed for nausea (Take 30 min before radiation).   rosuvastatin (CRESTOR) 40 MG tablet TAKE 1 TABLET DAILY   sucralfate (CARAFATE) 1 g tablet Take 1 tablet (1 g total) by mouth 4 (four) times daily -  with meals and at bedtime.   venlafaxine XR (EFFEXOR-XR) 75 MG 24 hr capsule Take 225 mg by mouth daily.     Allergies:   Penicillins   Social History   Tobacco Use   Smoking status: Former    Current packs/day: 0.00    Average packs/day: 0.3 packs/day for 43.0 years (10.8 ttl pk-yrs)    Types: Cigarettes    Start date: 02/28/1970    Quit date: 02/28/2013    Years since quitting: 10.4   Smokeless tobacco: Former    Quit date: 06/30/1980  Vaping Use   Vaping status: Never Used  Substance Use Topics   Alcohol use: No   Drug use: No     Family Hx: The patient's family history includes Cancer in his paternal uncle; Colon cancer in his cousin; Colon polyps in his father and sister; Diabetes in his brother, father, and mother; Heart disease in his mother; Hypertension in his brother, father, and mother; Prostate cancer (age of onset: 53) in his cousin.  ROS:   Please see the history of present illness.    All other systems are reviewed and are negative.   Prior CV studies:   The following studies were reviewed today:  ECHO 03/15/2021 1. Left ventricular ejection fraction, by estimation, is 60 to 65%. The left ventricle has normal function. The left ventricle has no regional wall motion abnormalities. There is mild left ventricular hypertrophy. Left ventricular diastolic parameters were normal. 2. Right ventricular systolic function is normal. The right ventricular size is normal. Tricuspid regurgitation signal is inadequate for assessing PA pressure. 3. The mitral valve is grossly normal, mildly thickened and calcified. Trivial mitral valve regurgitation. 4. The aortic valve is tricuspid. Aortic valve regurgitation is  not visualized. 5. The inferior vena cava is normal in size with greater than 50% respiratory variability, suggesting right atrial pressure of 3 mmHg.  Labs/Other Tests and Data Reviewed:    EKG:   EKG Interpretation Date/Time:  Tuesday August 11 2023 10:00:43 EST Ventricular Rate:  81 PR Interval:  138 QRS Duration:  82 QT Interval:  404 QTC Calculation: 469 R Axis:   19  Text Interpretation: Normal sinus rhythm Normal ECG When compared with ECG of 30-Jun-2018 11:12, No significant change was found Confirmed by Nels Munn ([id number]) on 08/11/2023 10:09:42 AM  Recent Labs: 04/30/2023: ALT 17; BUN 18; Creatinine 0.88; Hemoglobin 12.3; Platelets 286; Potassium 4.3; Sodium 139  02/22/2021 Hemoglobin 12.9, TSH 1.5, creatinine 0.6.  Most recent hemoglobin A1c was 6.4%.  06/25/2022 Creatinine 0.7, potassium 3.9, ALT 25, TSH 1.71  07/21/2023 creatinine 0.7, potassium 4.1, ALT 9   Recent Lipid Panel  Lipid Panel     Component Value Date/Time   CHOL 168 06/08/2019 0819   TRIG 207 (H) 06/08/2019 0819   HDL 35 (L) 06/08/2019 0819   CHOLHDL 4.8 06/08/2019 0819   LDLCALC 97 06/08/2019 0819   LABVLDL 36 06/08/2019 0819   05/29/2022 Cholesterol 144, triglycerides 131, HDL 40, LDL 81 05/28/2023 cholesterol 168, triglycerides 185, HDL 42, LDL 94, hemoglobin A1c 6.3%, hemoglobin 13.5  Wt Readings from Last 3 Encounters:  08/11/23 259 lb (117.5 kg)  06/01/23 271 lb 3.2 oz (123 kg)  05/20/23 268 lb 6.4 oz (121.7 kg)     Objective:    Vital Signs:  BP 122/70 (BP Location: Left Arm, Patient Position: Sitting, Cuff Size: Normal)   Pulse 81   Ht 6\' 2"  (1.88 m)   Wt 259 lb (117.5 kg)   SpO2 96%   BMI 33.25 kg/m      General: Alert, oriented x3, no distress, moderately obese Head: no evidence of trauma, PERRL, EOMI, no exophtalmos or lid lag, no myxedema, no xanthelasma; normal ears, nose and oropharynx Neck: normal jugular venous pulsations and no hepatojugular  reflux; brisk carotid pulses without delay and no carotid bruits Chest: clear to auscultation, no signs of consolidation by percussion or palpation, normal fremitus, symmetrical and full respiratory excursions Cardiovascular: normal position and quality of the apical impulse, regular rhythm, normal first and second heart sounds, no murmurs, rubs or gallops Abdomen: no tenderness or distention, no masses by palpation, no abnormal pulsatility or arterial bruits, normal bowel sounds, no hepatosplenomegaly Extremities: no clubbing, cyanosis or edema; 2+ radial, ulnar and brachial pulses bilaterally; 2+ right femoral, posterior tibial and dorsalis pedis pulses; 2+ left femoral, posterior tibial and dorsalis pedis pulses; no subclavian or femoral bruits He appears to have more prominent swelling in the MCP and PIP joints of both hands, although there is no redness or warmth.  Severely restricted ability to flex his fingers in both hands.  No rheumatoid nodules found. Neurological: grossly nonfocal Psych: Normal mood and affect     ASSESSMENT & PLAN:    1. COPD (chronic obstructive pulmonary disease) with chronic bronchitis (HCC)   2. Essential hypertension   3. Mixed hyperlipidemia   4. Type 2 diabetes mellitus with obesity (HCC)   5. OSA (obstructive sleep apnea)   6. Mild obesity   7. Seronegative rheumatoid arthritis of hand, unspecified laterality (HCC)   8. Prostate cancer metastatic to intrathoracic lymph node (HCC)        Chronic bronchitis: He had normal left ventricular systolic and diastolic parameters and no significant echo abnormalities.  Shortness of breath is primarily due to obesity as well as some component of chronic bronchitis, even though he quit smoking 10 years ago.  Current cough seems to be aggravated by recent radiation therapy, but no evidence of active pulmonary infection. HTN: Excellent control. HLP: LDL less than 100 seems to be a reasonable target for him.   Triglycerides are slight above target range, but no change in medications is indicated.  He is less physically active than before which could explain the changes.  Continue rosuvastatin and fenofibrate. DM: Adequate control without any glucose lowering  medications. OSA: Reports 100% compliance with CPAP and denies daytime hypersomnolence. Moderate obesity: Remains mildly obese, although he he has lost more than 10 pounds since initiating radiation therapy. RA: Improved on hydroxychloroquine Metastatic prostate cancer: On chronic antiandrogen therapy and recent XRT.     Medication Adjustments/Labs and Tests Ordered: Current medicines are reviewed at length with the patient today.  Concerns regarding medicines are outlined above.   Tests Ordered: Orders Placed This Encounter  Procedures   EKG 12-Lead     Medication Changes: No orders of the defined types were placed in this encounter.   Patient Instructions  Medication Instructions:  No changes *If you need a refill on your cardiac medications before your next appointment, please call your pharmacy*  Follow-Up: At [facility], you and your health needs are our priority.  As part of our continuing mission to provide you with exceptional heart care, we have created designated Provider Care Teams.  These Care Teams include your primary Cardiologist (physician) and Advanced Practice Providers (APPs -  Physician Assistants and Nurse Practitioners) who all work together to provide you with the care you need, when you need it.  We recommend signing up for the patient portal called "MyChart".  Sign up information is provided on this After Visit Summary.  MyChart is used to connect with patients for Virtual Visits (Telemedicine).  Patients are able to view lab/test results, encounter notes, upcoming appointments, etc.  Non-urgent messages can be sent to your provider as well.   To learn more about what you can do with MyChart, go to  [email or web address].    Your next appointment:   1 year(s)  Provider:   Thurmon Fair, MD           Signed, Thurmon Fair, MD  08/11/2023 10:52 AM    [facility]

## 2023-08-11 NOTE — Patient Instructions (Signed)

## 2023-08-13 ENCOUNTER — Other Ambulatory Visit: Payer: Self-pay | Admitting: Radiation Oncology

## 2023-08-24 ENCOUNTER — Ambulatory Visit: Payer: Medicare Other

## 2023-08-24 ENCOUNTER — Other Ambulatory Visit: Payer: Medicare Other

## 2023-08-30 NOTE — Assessment & Plan Note (Addendum)
 PSA, T, CMP and CBC today Continue ADT with enzalutamide for now If rising PSA will repeat PET

## 2023-08-30 NOTE — Progress Notes (Signed)
 Patient Care Team: Michael Satterfield, MD as PCP - General (Internal Medicine) Michael Headland, MD as PCP - Cardiology (Cardiology) [name] LABOR, MD as PCP - Sleep Medicine (Cardiology) Michael Pont, RN as Oncology Nurse Navigator  Clinic Day:  08/31/2023  Referring physician: Bertell Satterfield, MD  ASSESSMENT & PLAN:   Assessment & Plan: [name] is a very pleasant 68 year old male with history of hypertension, hyperlipidemia, COPD, prostate cancer came in with his wife today.  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 We discussed the management of M1 CRPC. [name] is progressing from novel hormone therapy. His PSADT was about 17 months back around the Spring time of 2024..   Diagnosis: M1 CRPC Germline: Ambry. NEG Somatic: TMB 2.7 m/MB. MSI high not detected. No actionable mutation. Previous treatment:  2007 prostatectomy  May 2010 salvage radiation.  Completed in  08/2018- current ADT/ARPI.  10-06/2023 Palliative radiation to oliogoprogression   Completed palliative radiation in November.  Discussed timing of PET again.  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  No teeth left. Aggressive cardiovascular risk management to control diabetes, hypertension and heart disease. He will continue his medications through PCP.  Normocytic anemia He is taking iron daily.  No signs of bone metastases causing anemia.  B12 low-normal level.  Continue B12 500 mcg daily.   Low serum vitamin B12 Continue B12 500 mcg daily Lab obtained today   Follow up in about 3 months. Repeat labs and visit. The patient understands the plans discussed today and is in agreement  with them.  He knows to contact our office if he develops concerns prior to his next appointment.  Michael JAYSON Chihuahua, MD  [facility] - A DEPT OF [facility] [address] [place] [address] Dept: [phone] Dept Fax: [phone]   No orders of the defined types were placed in this encounter.     CHIEF COMPLAINT:  CC: mCRPC  Current Treatment:  APRI/ADT  INTERVAL HISTORY:  [name] is here today for repeat clinical assessment.  No more pain or heartburn anymore. It lasted a month and a half. PPI and carafate  helped. No nausea, vomiting, diarrhea.  No bloody stool. No new bone or back pain. No more difficulty with urinating. No hematuria.  I have reviewed the past medical history, past surgical history, social history and family history with the patient and they are unchanged from previous note.  ALLERGIES:  is allergic to penicillins.  MEDICATIONS:  Current Outpatient Medications  Medication Sig Dispense Refill   ARIPiprazole  (ABILIFY ) 10 MG tablet Take 10 mg by mouth at bedtime.     Choline Fenofibrate  135 MG capsule Take 135 mg by mouth at bedtime. trilipix     Coenzyme Q10 (CO Q 10) 100 MG CAPS Take 100 mg by mouth every evening.     desipramine (NOPRAMIN) 100 MG tablet Take 100 mg by mouth at bedtime.   0   enalapril  (VASOTEC ) 10 MG tablet Take 10 mg by mouth every morning.      enzalutamide  (XTANDI ) 40 MG capsule Take 160 mg by mouth daily. Takes 4 capsules  daily     ferrous sulfate  325 (65 FE) MG tablet Take 325 mg by mouth daily.   0   HYDROcodone -acetaminophen  (HYCET) 7.5-325 mg/15 ml solution Take 10 mLs by mouth 4 (four) times daily as needed for moderate pain (pain score 4-6). Please advise on constipation precautions 120 mL 0   hydroxychloroquine (PLAQUENIL) 200 MG tablet Take 200 mg by mouth 2 (two) times daily.     Krill Oil 300 MG CAPS Take 300 mg by mouth daily.     lidocaine  (XYLOCAINE ) 2 %  solution Mix 1 to 1 ratio with Maalox to use for mouth pain/sores, use about 15 mL every 4 hours as needed 100 mL 0   meloxicam (MOBIC) 7.5 MG tablet Take 7.5 mg by mouth 2 (two) times daily.     Multiple Vitamin (MULTIVITAMIN WITH MINERALS) TABS tablet Take 1 tablet by mouth daily.     pantoprazole  (PROTONIX ) 20 MG tablet Take 1 tablet (20 mg total) by mouth daily. 30 tablet 0   pantoprazole  (PROTONIX ) 40 MG tablet Take 1 tablet (40 mg total) by mouth daily. 30 minutes before breakfast 90 tablet 3   prochlorperazine  (COMPAZINE ) 10 MG tablet TAKE 1 TABLET BY MOUTH EVERY 6 HOURS AS NEEDED FOR NAUSEA (TAKE 30 MINUTES BEFORE radiation) 30 tablet 2   rosuvastatin  (CRESTOR ) 40 MG tablet TAKE 1 TABLET DAILY 90 tablet 3   sucralfate  (CARAFATE ) 1 g tablet Take 1 tablet (1 g total) by mouth 4 (four) times daily -  with meals and at bedtime. 40 tablet 1   venlafaxine XR (EFFEXOR-XR) 75 MG 24 hr capsule Take 225 mg by mouth daily.  0   No current facility-administered medications for this visit.    HISTORY OF PRESENT ILLNESS:   Oncology History  Prostate cancer metastatic to intrathoracic lymph node (HCC)  2000 Initial Diagnosis   Initial diagnosis in [place], [place]   11/2005 Surgery   Robotic radical prostatectomy by Dr. Tanda [name]. pT3a N0.  Positive margin Gleason 3+4 = 7 adenocarcinoma   07/2008 Progression   PSA 0.11. salvage radiation.  Completed in May 2010. (Dr. Patrcia)   2014 Progression   Found in the appendiceal mass undergoing laparoscopy by general surgery.  Pathology showed solitary intraperitoneal metastases from prostate cancer   07/2013 - 04/2017 Chemotherapy   Started intermittent ADT   02/05/2018 Tumor Marker   PSA 3.77   05/19/2018 Tumor Marker   PSA 5.26   08/23/2018 Tumor Marker   PSA 6.22   08/2018 Progression   PSA increased to 6.22 eventually. CT showed multiple sclerotic bone lesions in the spine, sacrum, pelvis and bone scan with uptake in the right ischium and  multiple ribs.   08/2018 -  Chemotherapy   He started enzalutamide . PSA reached nadir <0.2.   03/15/2020 Imaging   Bone scan 1. Resolution of focal radiotracer activity in the right anterior fourth rib and left sacral ala since prior study. 2. Persistent asymmetric activity involving the distal clavicle right greater than left, nonspecific. While metastatic disease cannot be excluded, degenerative changes could also give this pattern. 3. Degenerative type activity of the bilateral glenohumeral joints, cervical spine, and lumbar spine   04/05/2021 Tumor Marker   PSA 0.11   07/17/2021 Tumor Marker   PSA 0.19 Testosterone  13.3   2023 Progression   Rising PSA   09/20/2021 Tumor Marker   PSA 0.26 Testosterone  20.9   12/16/2021 Tumor Marker   PSA 0.57 Testosterone  25.3   03/26/2022 Tumor Marker  PSA 1.17 Testosterone  14.1   06/2022 Imaging   PSMA PET: Positive abdominal lymph nodes mildly improved compared to prior conventional imaging 2020.   06/25/2022 Tumor Marker   PSA 1.36 Testosterone  13.9   10/01/2022 Tumor Marker   PSA 3.7 Testosterone  16.1   01/05/2023 Tumor Marker   PSA 4.2 Testosterone  17.6   03/2023 Tumor Marker   PSA 7.18   04/29/2023 Cancer Staging   Staging form: Prostate, AJCC 8th Edition - Clinical: Stage IVB (cT3a, cN0, cM1b, Grade Group: 2) - Signed by Tina Michael BROCKS, MD on 04/29/2023 Gleason score: 7 Histologic grading system: 5 grade system    Genetic Testing   Ambry testing. Negative for HRR mutation Tempus Liquid: Negative for HRR mutation   05/12/2023 Imaging   PSMA PET A high left posterior mediastinal node measures 7 mm and a S.U.V. max of 3.6 on 54/4. This measured 5 mm and was not tracer avid previously. A subcarinal node measures 8 mm and a S.U.V. max of 3.4 on 83/3. Compare 7 mm and not tracer avid on the prior. Lymph nodes: Porta hepatis nodes of up to 1.7 cm and a S.U.V. max of 36.6 on 114/4. Compare 9 mm and a S.U.V. max of 21.4 on  the prior. Left periaortic node measures 1.3 cm and a S.U.V. max of 35.3 on 134/4 versus 6 mm and a S.U.V. max of 4.5 on the prior. New tracer avid abdominal retroperitoneal node in the preaortic space at 4 mm and a S.U.V. max of 6.7 on 143/4.   Liver: No evidence of liver metastasis.  No focal activity to suggest skeletal metastasis. Lumbar spine fixation. Cervical spine fixation.   06/22/2023 - 07/03/2023 Radiation Therapy   ultrahypofractionated radiotherapy (UHRT) mediastinal and intraabdominal LNs   07/29/2023 -  Chemotherapy   Eligard  22.5 mg  PSA 15.8       REVIEW OF SYSTEMS:   All relevant systems were reviewed with the patient and are negative.   VITALS:  Blood pressure (!) 128/90, pulse 100, temperature (!) 97.2 F (36.2 C), temperature source Temporal, resp. rate 18, weight 258 lb 4.8 oz (117.2 kg), SpO2 98%.  Wt Readings from Last 3 Encounters:  08/31/23 258 lb 4.8 oz (117.2 kg)  08/11/23 259 lb (117.5 kg)  06/01/23 271 lb 3.2 oz (123 kg)    Body mass index is 33.16 kg/m.  Performance status (ECOG): 1 - Symptomatic but completely ambulatory  PHYSICAL EXAM:   GENERAL:alert, no distress and comfortable SKIN: skin color normal, no rashes  EYES: normal, sclera clear OROPHARYNX: no exudate, no erythema    NECK: supple,  non-tender, without nodularity LYMPH:  no palpable cervical lymphadenopathy LUNGS: clear to auscultation with normal breathing effort.  No wheeze or rales HEART: regular rate & rhythm and no murmurs and no lower extremity edema ABDOMEN: abdomen soft, non-tender and nondistended Musculoskeletal: no edema NEURO: alert, fluent speech, no focal motor/sensory deficits.  Strength and sensation equal bilaterally.  LABORATORY DATA:  I have reviewed the data as listed    Component Value Date/Time   NA 138 08/31/2023 1209   NA 141 06/08/2019 0823   K 4.1 08/31/2023 1209   CL 105 08/31/2023 1209   CO2 27 08/31/2023 1209   GLUCOSE 124 (H) 08/31/2023  1209   BUN 17 08/31/2023 1209   BUN 22 06/08/2019 0823   CREATININE 0.76 08/31/2023 1209   CALCIUM  10.1 08/31/2023 1209   PROT 7.5 08/31/2023 1209   PROT 7.3 06/08/2019 0823   ALBUMIN  4.5 08/31/2023 1209   ALBUMIN 4.6 06/08/2019 0823   AST 17 08/31/2023 1209   ALT 13 08/31/2023 1209   ALKPHOS 46 08/31/2023 1209   BILITOT 0.4 08/31/2023 1209   GFRNONAA >60 08/31/2023 1209   GFRAA 97 06/08/2019 0823    No results found for: SPEP, UPEP  Lab Results  Component Value Date   WBC 6.7 08/31/2023   NEUTROABS 4.3 08/31/2023   HGB 11.3 (L) 08/31/2023   HCT 34.0 (L) 08/31/2023   MCV 99.1 08/31/2023   PLT 260 08/31/2023      Chemistry      Component Value Date/Time   NA 138 08/31/2023 1209   NA 141 06/08/2019 0823   K 4.1 08/31/2023 1209   CL 105 08/31/2023 1209   CO2 27 08/31/2023 1209   BUN 17 08/31/2023 1209   BUN 22 06/08/2019 0823   CREATININE 0.76 08/31/2023 1209      Component Value Date/Time   CALCIUM  10.1 08/31/2023 1209   ALKPHOS 46 08/31/2023 1209   AST 17 08/31/2023 1209   ALT 13 08/31/2023 1209   BILITOT 0.4 08/31/2023 1209       RADIOGRAPHIC STUDIES: I have personally reviewed the radiological images as listed and agreed with the findings in the report. No results found.

## 2023-08-31 ENCOUNTER — Inpatient Hospital Stay (HOSPITAL_BASED_OUTPATIENT_CLINIC_OR_DEPARTMENT_OTHER): Payer: Medicare Other

## 2023-08-31 ENCOUNTER — Inpatient Hospital Stay: Payer: Medicare Other

## 2023-08-31 VITALS — BP 128/90 | HR 100 | Temp 97.2°F | Resp 18 | Wt 258.3 lb

## 2023-08-31 DIAGNOSIS — Z9189 Other specified personal risk factors, not elsewhere classified: Secondary | ICD-10-CM | POA: Diagnosis not present

## 2023-08-31 DIAGNOSIS — R109 Unspecified abdominal pain: Secondary | ICD-10-CM | POA: Insufficient documentation

## 2023-08-31 DIAGNOSIS — E785 Hyperlipidemia, unspecified: Secondary | ICD-10-CM | POA: Insufficient documentation

## 2023-08-31 DIAGNOSIS — E119 Type 2 diabetes mellitus without complications: Secondary | ICD-10-CM | POA: Insufficient documentation

## 2023-08-31 DIAGNOSIS — C7951 Secondary malignant neoplasm of bone: Secondary | ICD-10-CM | POA: Diagnosis not present

## 2023-08-31 DIAGNOSIS — E538 Deficiency of other specified B group vitamins: Secondary | ICD-10-CM

## 2023-08-31 DIAGNOSIS — C61 Malignant neoplasm of prostate: Secondary | ICD-10-CM

## 2023-08-31 DIAGNOSIS — D649 Anemia, unspecified: Secondary | ICD-10-CM | POA: Diagnosis not present

## 2023-08-31 DIAGNOSIS — Z9221 Personal history of antineoplastic chemotherapy: Secondary | ICD-10-CM | POA: Insufficient documentation

## 2023-08-31 DIAGNOSIS — Z79899 Other long term (current) drug therapy: Secondary | ICD-10-CM | POA: Insufficient documentation

## 2023-08-31 DIAGNOSIS — C771 Secondary and unspecified malignant neoplasm of intrathoracic lymph nodes: Secondary | ICD-10-CM | POA: Diagnosis not present

## 2023-08-31 DIAGNOSIS — Z791 Long term (current) use of non-steroidal anti-inflammatories (NSAID): Secondary | ICD-10-CM | POA: Diagnosis not present

## 2023-08-31 DIAGNOSIS — J449 Chronic obstructive pulmonary disease, unspecified: Secondary | ICD-10-CM | POA: Diagnosis not present

## 2023-08-31 DIAGNOSIS — I10 Essential (primary) hypertension: Secondary | ICD-10-CM | POA: Diagnosis not present

## 2023-08-31 LAB — CBC WITH DIFFERENTIAL (CANCER CENTER ONLY)
Abs Immature Granulocytes: 0.03 10*3/uL (ref 0.00–0.07)
Basophils Absolute: 0 10*3/uL (ref 0.0–0.1)
Basophils Relative: 0 %
Eosinophils Absolute: 0.5 10*3/uL (ref 0.0–0.5)
Eosinophils Relative: 7 %
HCT: 34 % — ABNORMAL LOW (ref 39.0–52.0)
Hemoglobin: 11.3 g/dL — ABNORMAL LOW (ref 13.0–17.0)
Immature Granulocytes: 0 %
Lymphocytes Relative: 18 %
Lymphs Abs: 1.2 10*3/uL (ref 0.7–4.0)
MCH: 32.9 pg (ref 26.0–34.0)
MCHC: 33.2 g/dL (ref 30.0–36.0)
MCV: 99.1 fL (ref 80.0–100.0)
Monocytes Absolute: 0.7 10*3/uL (ref 0.1–1.0)
Monocytes Relative: 10 %
Neutro Abs: 4.3 10*3/uL (ref 1.7–7.7)
Neutrophils Relative %: 65 %
Platelet Count: 260 10*3/uL (ref 150–400)
RBC: 3.43 MIL/uL — ABNORMAL LOW (ref 4.22–5.81)
RDW: 14.2 % (ref 11.5–15.5)
WBC Count: 6.7 10*3/uL (ref 4.0–10.5)
nRBC: 0 % (ref 0.0–0.2)

## 2023-08-31 LAB — CMP (CANCER CENTER ONLY)
ALT: 13 U/L (ref 0–44)
AST: 17 U/L (ref 15–41)
Albumin: 4.5 g/dL (ref 3.5–5.0)
Alkaline Phosphatase: 46 U/L (ref 38–126)
Anion gap: 6 (ref 5–15)
BUN: 17 mg/dL (ref 8–23)
CO2: 27 mmol/L (ref 22–32)
Calcium: 10.1 mg/dL (ref 8.9–10.3)
Chloride: 105 mmol/L (ref 98–111)
Creatinine: 0.76 mg/dL (ref 0.61–1.24)
GFR, Estimated: >60 mL/min (ref >60)
Glucose, Bld: 124 mg/dL — ABNORMAL HIGH (ref 70–99)
Potassium: 4.1 mmol/L (ref 3.5–5.1)
Sodium: 138 mmol/L (ref 135–145)
Total Bilirubin: 0.4 mg/dL (ref 0.0–1.2)
Total Protein: 7.5 g/dL (ref 6.5–8.1)

## 2023-08-31 LAB — VITAMIN B12: Vitamin B-12: 391 pg/mL (ref 180–914)

## 2023-08-31 LAB — LACTATE DEHYDROGENASE: LDH: 159 U/L (ref 98–192)

## 2023-08-31 NOTE — Assessment & Plan Note (Signed)
 He is taking iron daily.  No signs of bone metastases causing anemia.  B12 low-normal level.  Continue B12 500 mcg daily.

## 2023-08-31 NOTE — Assessment & Plan Note (Addendum)
 Bone health Taking vitamin D 1000 - 2000 IUs and calcium supplement 1000 mg-1200 mg daily Zometa can be considered in the future if worsening bone density, or development of new bone metastases.  No teeth left. Aggressive cardiovascular risk management to control diabetes, hypertension and heart disease. He will continue his medications through PCP.

## 2023-08-31 NOTE — Assessment & Plan Note (Signed)
 Continue B12 500 mcg daily Lab obtained today

## 2023-09-01 ENCOUNTER — Telehealth: Payer: Self-pay

## 2023-09-01 ENCOUNTER — Telehealth: Payer: Self-pay | Admitting: Pharmacy Technician

## 2023-09-01 ENCOUNTER — Other Ambulatory Visit: Payer: Self-pay

## 2023-09-01 ENCOUNTER — Other Ambulatory Visit (HOSPITAL_COMMUNITY): Payer: Self-pay

## 2023-09-01 DIAGNOSIS — C61 Malignant neoplasm of prostate: Secondary | ICD-10-CM

## 2023-09-01 LAB — PSA, TOTAL AND FREE
PSA, Free Pct: 50.4 %
PSA, Free: 3.78 ng/mL
Prostate Specific Ag, Serum: 7.5 ng/mL — ABNORMAL HIGH (ref 0.0–4.0)

## 2023-09-01 LAB — TESTOSTERONE: Testosterone: 14 ng/dL — ABNORMAL LOW (ref 264–916)

## 2023-09-01 NOTE — Telephone Encounter (Signed)
 Oral Oncology Patient Advocate Encounter  Was successful in securing patient a $8,000 grant from [employer] to provide copayment coverage for Xtandi .  This will keep the out of pocket expense at $0.     [employer] ID: [id number]  I have spoken with the patient.   The billing information is as follows and has been shared with [facility].    RxBin: [id number] PCN: [id number] Member ID: [id number] Group ID: [id number] Dates of Eligibility: 06/21/23 through 06/19/24  Fund:  Prostate  Estefana Moellers, CPhT-Adv Oncology Pharmacy Patient Advocate [facility] Direct Number: [phone]  Fax: [phone]

## 2023-09-07 ENCOUNTER — Encounter (HOSPITAL_COMMUNITY)
Admission: RE | Admit: 2023-09-07 | Discharge: 2023-09-07 | Disposition: A | Discharge status: Home / Self Care | Payer: Medicare Other | Source: Ambulatory Visit

## 2023-09-07 DIAGNOSIS — C61 Malignant neoplasm of prostate: Secondary | ICD-10-CM | POA: Diagnosis present

## 2023-09-07 DIAGNOSIS — C771 Secondary and unspecified malignant neoplasm of intrathoracic lymph nodes: Secondary | ICD-10-CM | POA: Diagnosis present

## 2023-09-07 MED ORDER — FLOTUFOLASTAT F 18 GALLIUM 296-5846 MBQ/ML IV SOLN
8.0760 | Freq: Once | INTRAVENOUS | Status: AC
  Administered 2023-09-07: 8.076 via INTRAVENOUS

## 2023-09-08 NOTE — Telephone Encounter (Signed)
 Discussed with MD, patient to be getting a new PET scan prior to sending in new prescription of medication. Will sign off until prescription is sent.   [name], PharmD Hematology/Oncology Clinical Pharmacist [name] [phone]

## 2023-09-10 ENCOUNTER — Telehealth: Payer: Self-pay

## 2023-09-14 ENCOUNTER — Telehealth: Payer: Self-pay

## 2023-09-15 ENCOUNTER — Inpatient Hospital Stay (HOSPITAL_BASED_OUTPATIENT_CLINIC_OR_DEPARTMENT_OTHER): Payer: Medicare Other

## 2023-09-15 DIAGNOSIS — C7951 Secondary malignant neoplasm of bone: Secondary | ICD-10-CM

## 2023-09-15 DIAGNOSIS — C61 Malignant neoplasm of prostate: Secondary | ICD-10-CM | POA: Diagnosis not present

## 2023-09-15 DIAGNOSIS — Z9189 Other specified personal risk factors, not elsewhere classified: Secondary | ICD-10-CM

## 2023-09-15 DIAGNOSIS — E538 Deficiency of other specified B group vitamins: Secondary | ICD-10-CM | POA: Diagnosis not present

## 2023-09-15 NOTE — Progress Notes (Signed)
START OFF PATHWAY REGIMEN - Prostate   [id number]:Sipuleucel-T IV D1 q14 Days x 3 Cycles:   A cycle is every 14 days:     Sipuleucel-T   **Always confirm dose/schedule in your pharmacy ordering system**  Patient Characteristics: Adenocarcinoma, Recurrent/New Systemic Disease (Including Biochemical Recurrence), Castration Resistant, M1, Prior Novel Hormonal Agent, No Molecular Alteration or Targeted Therapy Exhausted, No Prior Docetaxel Histology: Adenocarcinoma Therapeutic Status: Recurrent/New Systemic Disease (Including Biochemical Recurrence) Intent of Therapy: Non-Curative / Palliative Intent, Discussed with Patient

## 2023-09-15 NOTE — Assessment & Plan Note (Signed)
Continue B12 500 mcg daily

## 2023-09-15 NOTE — Assessment & Plan Note (Signed)
Bone health Taking vitamin D 1000 - 2000 IUs and calcium supplement 1000 mg-1200 mg daily Zometa can be considered in the future if worsening bone density, or development of new bone metastases.  No teeth left. Aggressive cardiovascular risk management to control diabetes, hypertension and heart disease. He will continue his medications through PCP.

## 2023-09-15 NOTE — Assessment & Plan Note (Signed)
Continue ADT with enzalutamide  Sipuleucel-T

## 2023-09-15 NOTE — Progress Notes (Signed)
Patient Care Team: [name], MD as PCP - General (Internal Medicine) Thurmon Fair, MD as PCP - Cardiology (Cardiology) Lennette Bihari, MD as PCP - Sleep Medicine (Cardiology) [name], RN as Oncology Nurse Navigator  Clinic Day:  09/15/2023  Referring physician: [name], MD  ASSESSMENT & PLAN:  Location of the provider: [facility] Location of the patient: home Telephone only encounter  Assessment & Plan: [name] is a very pleasant 68 year old male with history of hypertension, hyperlipidemia, COPD, prostate cancer being follow-up for prostate cancer.  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 We discussed the management of M1 CRPC. Dieon is progressing from novel hormone therapy. New PET showed one  new site of bone metastasis.  Diagnosis: M1 CRPC Germline: Ambry. NEG Somatic: TMB 2.7 m/MB. MSI high not detected. No actionable mutation. Previous treatment:  2007 prostatectomy  May 2010 salvage radiation.  Completed in  08/2018- current ADT/ARPI.  10-06/2023 Palliative radiation to oliogoprogression to lymph nodes in the mediastinum and abdomen  Discussed sipuleucel-T.  Discussed potential risks of infusion reaction, fatigue, flu like symptoms, fatigue, headache, nausea, vomiting, musculoskeletal pain.  Small risk of infection, thrombosis may be associated with line placement.  After discussion, he expressed understanding and feels fine to proceed.  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  No teeth left. Aggressive cardiovascular risk management to control diabetes, hypertension and heart disease. He will continue his medications through PCP.  Low serum vitamin  B12 Continue B12 500 mcg daily    Total time 33 minutes.  The patient understands the plans discussed today and is in agreement with them.  He knows to contact our office if he develops concerns prior to his next appointment.  [name], MD  [facility] - A DEPT OF [facility] [address] [place] [address] Dept: [phone] Dept Fax: [phone]   No orders of the defined types were placed in this encounter.     CHIEF COMPLAINT:  CC: mCRPC  Current Treatment:  ADT/Enza  INTERVAL HISTORY:  [name] is here today for repeat clinical assessment. He is being follow-up to discuss PET/CT results.  Report of right-sided abdominal pain.  Normal bowel movement.  No hip pain or new back pain.  I have reviewed the past medical history, past surgical history, social history and family history with the patient and they are unchanged from previous note.  ALLERGIES:  is allergic to penicillins.  MEDICATIONS:  Current Outpatient Medications  Medication Sig Dispense Refill   ARIPiprazole (ABILIFY) 10 MG tablet Take 10 mg by mouth at bedtime.     Choline Fenofibrate 135 MG capsule Take 135 mg by mouth at bedtime. trilipix     Coenzyme Q10 (CO Q 10) 100 MG CAPS Take 100 mg by mouth every evening.     desipramine (NOPRAMIN) 100 MG tablet Take 100 mg by mouth at bedtime.   0   enalapril (VASOTEC) 10 MG tablet Take 10 mg by mouth every morning.      enzalutamide (XTANDI) 40 MG capsule Take 160 mg by mouth daily. Takes 4 capsules daily     ferrous sulfate 325 (65 FE)  MG tablet Take 325 mg by mouth daily.   0   HYDROcodone-acetaminophen (HYCET) 7.5-325 mg/15 ml solution Take 10 mLs by mouth 4 (four) times daily as needed for moderate pain (pain score 4-6). Please advise on constipation precautions 120 mL 0   hydroxychloroquine (PLAQUENIL) 200 MG tablet Take 200 mg by mouth 2 (two) times daily.     Krill Oil 300 MG CAPS  Take 300 mg by mouth daily.     lidocaine (XYLOCAINE) 2 % solution Mix 1 to 1 ratio with Maalox to use for mouth pain/sores, use about 15 mL every 4 hours as needed 100 mL 0   meloxicam (MOBIC) 7.5 MG tablet Take 7.5 mg by mouth 2 (two) times daily.     Multiple Vitamin (MULTIVITAMIN WITH MINERALS) TABS tablet Take 1 tablet by mouth daily.     pantoprazole (PROTONIX) 20 MG tablet Take 1 tablet (20 mg total) by mouth daily. 30 tablet 0   pantoprazole (PROTONIX) 40 MG tablet Take 1 tablet (40 mg total) by mouth daily. 30 minutes before breakfast 90 tablet 3   prochlorperazine (COMPAZINE) 10 MG tablet TAKE 1 TABLET BY MOUTH EVERY 6 HOURS AS NEEDED FOR NAUSEA (TAKE 30 MINUTES BEFORE radiation) 30 tablet 2   rosuvastatin (CRESTOR) 40 MG tablet TAKE 1 TABLET DAILY 90 tablet 3   sucralfate (CARAFATE) 1 g tablet Take 1 tablet (1 g total) by mouth 4 (four) times daily -  with meals and at bedtime. 40 tablet 1   venlafaxine XR (EFFEXOR-XR) 75 MG 24 hr capsule Take 225 mg by mouth daily.  0   No current facility-administered medications for this visit.    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 Gaynelle Arabian. pT3a N0.  Positive margin Gleason 3+4 = 7 adenocarcinoma   07/2008 Progression   PSA 0.11. salvage radiation.  Completed in May 2010. (Dr. Kathrynn Running)   2014 Progression   Found in the appendiceal mass undergoing laparoscopy by general surgery.  Pathology showed solitary intraperitoneal metastases from prostate cancer   07/2013 - 04/2017 Chemotherapy   Started intermittent ADT   02/05/2018 Tumor Marker   PSA 3.77   05/19/2018 Tumor Marker   PSA 5.26   08/23/2018 Tumor Marker   PSA 6.22   08/2018 Progression   PSA increased to 6.22 eventually. CT showed multiple sclerotic bone lesions in the spine, sacrum,  pelvis and bone scan with uptake in the right ischium and multiple ribs.   08/2018 -  Chemotherapy   He started enzalutamide. PSA reached nadir <0.2.   03/15/2020 Imaging   Bone scan 1. Resolution of focal radiotracer activity in the right anterior fourth rib and left sacral ala since prior study. 2. Persistent asymmetric activity involving the distal clavicle right greater than left, nonspecific. While metastatic disease cannot be excluded, degenerative changes could also give this pattern. 3. Degenerative type activity of the bilateral glenohumeral joints, cervical spine, and lumbar spine   04/05/2021 Tumor Marker   PSA 0.11   07/17/2021 Tumor Marker   PSA 0.19 Testosterone 13.3   2023 Progression   Rising PSA   09/20/2021 Tumor Marker   PSA 0.26 Testosterone 20.9   12/16/2021 Tumor Marker   PSA 0.57 Testosterone 25.3   03/26/2022 Tumor Marker   PSA 1.17 Testosterone 14.1   06/2022 Imaging  PSMA PET: Positive abdominal lymph nodes mildly improved compared to prior conventional imaging 2020.   06/25/2022 Tumor Marker   PSA 1.36 Testosterone 13.9   10/01/2022 Tumor Marker   PSA 3.7 Testosterone 16.1   01/05/2023 Tumor Marker   PSA 4.2 Testosterone 17.6   03/2023 Tumor Marker   PSA 7.18   04/29/2023 Cancer Staging   Staging form: Prostate, AJCC 8th Edition - Clinical: Stage IVB (cT3a, cN0, cM1b, Grade Group: 2) - Signed by [name], MD on 04/29/2023 Gleason score: 7 Histologic grading system: 5 grade system    Genetic Testing   Ambry testing. Negative for HRR mutation Tempus Liquid: Negative for HRR mutation   05/12/2023 Imaging   PSMA PET A high left posterior mediastinal node measures 7 mm and a S.U.V. max of 3.6 on 54/4. This measured 5 mm and was not tracer avid previously. A subcarinal node measures 8 mm and a S.U.V. max of 3.4 on 83/3. Compare 7 mm and not tracer avid on the prior. Lymph nodes: Porta hepatis nodes of up to 1.7 cm and a S.U.V. max of  36.6 on 114/4. Compare 9 mm and a S.U.V. max of 21.4 on the prior. Left periaortic node measures 1.3 cm and a S.U.V. max of 35.3 on 134/4 versus 6 mm and a S.U.V. max of 4.5 on the prior. New tracer avid abdominal retroperitoneal node in the preaortic space at 4 mm and a S.U.V. max of 6.7 on 143/4.   Liver: No evidence of liver metastasis.  No focal activity to suggest skeletal metastasis. Lumbar spine fixation. Cervical spine fixation.   06/22/2023 - 07/03/2023 Radiation Therapy   ultrahypofractionated radiotherapy (UHRT) mediastinal and intraabdominal LNs   07/29/2023 -  Chemotherapy   Eligard 22.5 mg  PSA 15.8   09/07/2023 PET scan   PSMA PET 1. Stable intensely radiotracer avid retroperitoneal and periportal lymph nodes consistent with prostate carcinoma nodal metastasis. No interval change. 2. No evidence of local recurrence in the prostatectomy bed. 3. No evidence of visceral metastasis. 4. Single new small focus of radiotracer activity in the RIGHT acetabulum is concerning oligometastatic skeletal metastasis.       REVIEW OF SYSTEMS:   All relevant systems were reviewed with the patient and are negative.   VITALS:  There were no vitals taken for this visit.  Wt Readings from Last 3 Encounters:  08/31/23 258 lb 4.8 oz (117.2 kg)  08/11/23 259 lb (117.5 kg)  06/01/23 271 lb 3.2 oz (123 kg)    There is no height or weight on file to calculate BMI.  Performance status (ECOG): 1 - Symptomatic but completely ambulatory  PHYSICAL EXAM:  NA  LABORATORY DATA:  I have reviewed the data as listed    Component Value Date/Time   NA 138 08/31/2023 1209   NA 141 06/08/2019 0823   K 4.1 08/31/2023 1209   CL 105 08/31/2023 1209   CO2 27 08/31/2023 1209   GLUCOSE 124 (H) 08/31/2023 1209   BUN 17 08/31/2023 1209   BUN 22 06/08/2019 0823   CREATININE 0.76 08/31/2023 1209   CALCIUM 10.1 08/31/2023 1209   PROT 7.5 08/31/2023 1209   PROT 7.3 06/08/2019 0823   ALBUMIN 4.5  08/31/2023 1209   ALBUMIN 4.6 06/08/2019 0823   AST 17 08/31/2023 1209   ALT 13 08/31/2023 1209   ALKPHOS 46 08/31/2023 1209   BILITOT 0.4 08/31/2023 1209   GFRNONAA >60 08/31/2023 1209   GFRAA 97 06/08/2019 0823  No results found for: "SPEP", "UPEP"  Lab Results  Component Value Date   WBC 6.7 08/31/2023   NEUTROABS 4.3 08/31/2023   HGB 11.3 (L) 08/31/2023   HCT 34.0 (L) 08/31/2023   MCV 99.1 08/31/2023   PLT 260 08/31/2023      Chemistry      Component Value Date/Time   NA 138 08/31/2023 1209   NA 141 06/08/2019 0823   K 4.1 08/31/2023 1209   CL 105 08/31/2023 1209   CO2 27 08/31/2023 1209   BUN 17 08/31/2023 1209   BUN 22 06/08/2019 0823   CREATININE 0.76 08/31/2023 1209      Component Value Date/Time   CALCIUM 10.1 08/31/2023 1209   ALKPHOS 46 08/31/2023 1209   AST 17 08/31/2023 1209   ALT 13 08/31/2023 1209   BILITOT 0.4 08/31/2023 1209       RADIOGRAPHIC STUDIES: I have personally reviewed the radiological images as listed and agreed with the findings in the report. NM PET (PSMA) SKULL TO MID THIGH Result Date: 09/11/2023 CLINICAL DATA:  Prostate carcinoma.  Mesenteric metastasis. EXAM: NUCLEAR MEDICINE PET SKULL BASE TO THIGH TECHNIQUE: 8.1 mCi Flotufolastat (Posluma) was injected intravenously. Full-ring PET imaging was performed from the skull base to thigh after the radiotracer. CT data was obtained and used for attenuation correction and anatomic localization. COMPARISON:  None Available. FINDINGS: NECK No radiotracer activity in neck lymph nodes. Incidental CT finding: None. CHEST No radiotracer accumulation within mediastinal or hilar lymph nodes. No suspicious pulmonary nodules on the CT scan. Incidental CT finding: None. ABDOMEN/PELVIS Prostate: No focal activity in prostatectomy bed. Lymph nodes: No radiotracer avid lymph nodes in the pelvis. Again demonstrated intense radiotracer avid periaortic retroperitoneal lymph nodes not changed from prior. For  example 11 mm node on image 137 with SUV max equal 31.4 unchanged in size are intensity from comparison exam. Similar small radiotracer avid lymph nodes in the central mesentery at the takeoff of the SMV. More intensely radiotracer avid enlarged periportal lymph node deep to the pancreas with SUV max equal 42 also unchanged from prior. (Image 115. No new radiotracer avid or enlarged lymph nodes. Liver: No evidence of liver metastasis. Incidental CT finding: None. SKELETON Single new very small focus radiotracer activity anterior aspect of the RIGHT acetabulum SUV max equal 9.3 on image 191. There is a subtle sclerotic lesion measuring 5 mm. IMPRESSION: 1. Stable intensely radiotracer avid retroperitoneal and periportal lymph nodes consistent with prostate carcinoma nodal metastasis. No interval change. 2. No evidence of local recurrence in the prostatectomy bed. 3. No evidence of visceral metastasis. 4. Single new small focus of radiotracer activity in the RIGHT acetabulum is concerning oligometastatic skeletal metastasis. Electronically Signed   By: Genevive Bi M.D.   On: 09/11/2023 11:36

## 2023-09-17 ENCOUNTER — Other Ambulatory Visit: Payer: Self-pay

## 2023-09-17 NOTE — Progress Notes (Signed)
RN faxed enrollment form for [employer].  Once [employer] receives [employer], they will proceed with patient's benefit reviews/treatment.     RN spoke with patient and advised to check email for [facility] information.  Verbalized understanding.  Will continue to follow.

## 2023-09-18 NOTE — Progress Notes (Signed)
Received verification of benefits from [employer].  Apheresis order form faxed to [facility] for review of venous assessment.

## 2023-09-21 NOTE — Progress Notes (Signed)
Patient will proceed with vein assessment at the [facility] tomorrow, 1/28 at 2pm.   RN spoke with patient and provided education on next steps after assessment.  All questions answered.

## 2023-09-22 NOTE — Progress Notes (Signed)
Patient proceed with vein assessment today and has acceptable peripheral access for apheresis.    RN spoke with scheduling at [name] and they are reviewing availability with [facility].

## 2023-09-24 NOTE — Progress Notes (Signed)
Patient is set up for cell collection at the [facility] on 2/10; 2/24; and 3/10.   RN placed request to review if infusion is available for 2/13; 2/27; 3/13 after 11:15 am.

## 2023-09-25 ENCOUNTER — Telehealth: Payer: Self-pay

## 2023-09-25 NOTE — Telephone Encounter (Signed)
Scheduled appointment per 1/31 scheduling message. Patient is aware of the made appointment and will be mailed a reminder.

## 2023-10-01 ENCOUNTER — Inpatient Hospital Stay: Payer: Medicare Other

## 2023-10-01 DIAGNOSIS — C7951 Secondary malignant neoplasm of bone: Secondary | ICD-10-CM | POA: Insufficient documentation

## 2023-10-01 DIAGNOSIS — C61 Malignant neoplasm of prostate: Secondary | ICD-10-CM | POA: Insufficient documentation

## 2023-10-01 DIAGNOSIS — Z5111 Encounter for antineoplastic chemotherapy: Secondary | ICD-10-CM | POA: Insufficient documentation

## 2023-10-01 NOTE — Progress Notes (Signed)
 RN left message for patient to call back to review final infusion schedule for [employer] .

## 2023-10-01 NOTE — Patient Instructions (Signed)
D

## 2023-10-01 NOTE — Progress Notes (Signed)
 RN spoke with patient and reviewed all upcoming appointments for apheresis and infusions.  Provided patient with calendar of all dates via mychart.

## 2023-10-02 ENCOUNTER — Other Ambulatory Visit (HOSPITAL_COMMUNITY): Payer: Self-pay

## 2023-10-05 ENCOUNTER — Other Ambulatory Visit: Payer: Self-pay

## 2023-10-05 DIAGNOSIS — C61 Malignant neoplasm of prostate: Secondary | ICD-10-CM

## 2023-10-05 NOTE — Addendum Note (Signed)
 Addended by: Verlena Glenn on: 10/05/2023 03:03 PM   Modules accepted: Orders

## 2023-10-05 NOTE — Progress Notes (Signed)
 Patient presented to [employer] today for cell collection.   Unfortunately they were unable to obtain good vein access for collection and have requested that a CVC be placed.    Order placed for CVC, message to IR scheduler sent to ensure correct order/placement.  Infusion for Thursday will be canceled.    RN left message with patient to review next steps.

## 2023-10-06 ENCOUNTER — Other Ambulatory Visit: Payer: Self-pay | Admitting: Radiology

## 2023-10-06 ENCOUNTER — Encounter (HOSPITAL_COMMUNITY): Payer: Self-pay | Admitting: Emergency Medicine

## 2023-10-06 ENCOUNTER — Other Ambulatory Visit: Payer: Self-pay

## 2023-10-06 DIAGNOSIS — C61 Malignant neoplasm of prostate: Secondary | ICD-10-CM

## 2023-10-07 NOTE — H&P (Signed)
Chief Complaint: Metastatic prostate cancer; referred for tunneled apheresis/dialysis catheter placement for leukapheresis/immunotherapy  Referring Provider(s): Chang,R  Supervising Physician: Gilmer Mor  Patient Status: [facility] - Out-pt  History of Present Illness: [name] is a 68 y.o. male with past medical history significant for anemia, anxiety, arthritis, depression, GERD, COPD, hypertension, hyperlipidemia, posttraumatic stress disorder, prediabetes, sleep apnea, urinary incontinence, tobacco abuse and metastatic prostate cancer with recent PET scan showing one new site of bone metastasis.  He is scheduled today for tunneled apheresis/dialysis catheter placement for leukapheresis/immunotherapy.  *** Patient is Full Code  Past Medical History:  Diagnosis Date   Anemia    Anxiety    Arthritis    Bladder stone    Cancer (HCC) 2007   prostate CA   COPD (chronic obstructive pulmonary disease) (HCC)    Decreased pulse 08/16/2008   LE doppler - bilateral ABIs normal, bilateral PVRs normal waveform; bilateral common iliac arteries difficult to visualize however velocities suggest less than 50% diameter reduction; bilateral LE normal velocities w/o evidence of diameter reduction   Depression    GERD (gastroesophageal reflux disease)    Hypercholesteremia    Hypertension    Mental disorder    PTSD   Pre-diabetes    Shortness of breath    Sleep apnea    wears CPAP nightly, settings at 14    Tobacco abuse    Urinary incontinence    due to prostate removal    Past Surgical History:  Procedure Laterality Date   ANTERIOR LAT LUMBAR FUSION N/A 11/17/2012   Procedure: XLIF L3 - L4 POSTERIOR L3 -L4 FUSION INSTRUMENTATION/L2 - L3 DECOMPRESSION (REVISION OF POSTERIOR HARDWARE) 1 LEVEL;  Surgeon: Venita Lick, MD;  Location: [facility] OR;  Service: Orthopedics;  Laterality: N/A;  procedure #1: start 0923, end 1057.    BACK SURGERY  2010 / 2014   BLADDER SUSPENSION     x 2    CARDIAC CATHETERIZATION     no PCI   CARDIOVASCULAR STRESS TEST  08/16/2008   EF 57%; inferior region has diaphragmatic attenuation, otherwise normal perfusion w/o evidence of ischemia or infarct; LV normal in size; no scintographic evidence of inducible ischemia; no ECG changes, negative for ischemia; low risk scan   CERVICAL FUSION     COLON RESECTION N/A 07/12/2013   peritoneal biopsy only, evidence of metastatic prostate cancer   COLONOSCOPY     COLONOSCOPY N/A 05/27/2013   RMR: Extrinsic mass effect at the level of the appendiceal orifice likely represents an appendiceal or periappendiceal process. Neovascular changes of the rectal mucosa consistent with prior history of radiation treatment. Multiple 3-5 mm polyps removed, several tubular adenomas. Diverticulosis.nex tcs 2019   COLONOSCOPY WITH PROPOFOL N/A 07/05/2018   Procedure: COLONOSCOPY WITH PROPOFOL;  Surgeon: Corbin Ade, MD;  Location: [facility];  Service: Endoscopy;  Laterality: N/A;  9:30am   COLONOSCOPY WITH PROPOFOL N/A 03/07/2021   Procedure: COLONOSCOPY WITH PROPOFOL;  Surgeon: Corbin Ade, MD;  Location: [facility];  Service: Endoscopy;  Laterality: N/A;  2:30pm   CYSTOSCOPY W/ URETERAL STENT PLACEMENT N/A 10/03/2013   Procedure: CYSTOSCOPY AND REMOVAL OF BLADDER NECK STONE ;  Surgeon: Crecencio Mc, MD;  Location: [facility] ORS;  Service: Urology;  Laterality: N/A;  WITH REMOVAL OF BLADDER NECK STONE     ESOPHAGOGASTRODUODENOSCOPY (EGD) WITH ESOPHAGEAL DILATION N/A 05/27/2013   RMR: Mild erosive reflux esophagitis with soft noncritical appearing stricture status post Maloney dilation, antral erosions with reactive gastropathy but no  H. pylori   ESOPHAGOGASTRODUODENOSCOPY (EGD) WITH PROPOFOL N/A 07/05/2018   Procedure: ESOPHAGOGASTRODUODENOSCOPY (EGD) WITH PROPOFOL;  Surgeon: Corbin Ade, MD;  [facility]: [facility];  Service: Endoscopy;  Laterality: N/A;   ESOPHAGOGASTRODUODENOSCOPY (EGD) WITH PROPOFOL N/A 02/05/2023    Procedure: ESOPHAGOGASTRODUODENOSCOPY (EGD) WITH PROPOFOL;  Surgeon: Corbin Ade, MD;  [facility]: [facility];  Service: Endoscopy;  Laterality: N/A;  11:30 am, asa 3   HOLMIUM LASER APPLICATION N/A 10/03/2013   Procedure: HOLMIUM LASER APPLICATION;  Surgeon: Crecencio Mc, MD;  Location: [facility];  Service: Urology;  Laterality: N/A;   MALONEY DILATION N/A 07/05/2018   Procedure: Elease Hashimoto DILATION;  Surgeon: Corbin Ade, MD;  Location: [facility];  Service: Endoscopy;  Laterality: N/A;   MALONEY DILATION N/A 02/05/2023   Procedure: Elease Hashimoto DILATION;  Surgeon: Corbin Ade, MD;  [facility]: [facility];  Service: Endoscopy;  Laterality: N/A;   POLYPECTOMY  07/05/2018   Procedure: POLYPECTOMY;  Surgeon: Corbin Ade, MD;  [facility]: [facility];  Service: Endoscopy;;  cecal polyp , splenic flexure polyps x4   POLYPECTOMY  03/07/2021   Procedure: POLYPECTOMY;  Surgeon: Corbin Ade, MD;  [facility]: [facility];  Service: Endoscopy;;   PROSTATE SURGERY     TRANSTHORACIC ECHOCARDIOGRAM  08/16/2008   essentially normal for age   URINARY SPHINCTER IMPLANT N/A 01/31/2014   Procedure: IMPLANTATION ARTIFICIAL SPHINCTER  WITH CYSTOSCOPY;  Surgeon: Martina Sinner, MD;  Location: [facility];  Service: Urology;  Laterality: N/A;   WRIST SURGERY Right     Allergies: Penicillins  Medications: Prior to Admission medications   Medication Sig Start Date End Date Taking? Authorizing Provider  ARIPiprazole (ABILIFY) 10 MG tablet Take 10 mg by mouth at bedtime. 04/13/18   [provider]  Choline Fenofibrate 135 MG capsule Take 135 mg by mouth at bedtime. trilipix    [provider]  Coenzyme Q10 (CO Q 10) 100 MG CAPS Take 100 mg by mouth every evening.    [provider]  desipramine (NOPRAMIN) 100 MG tablet Take 100 mg by mouth at bedtime.  09/04/14   [provider]  enalapril (VASOTEC) 10 MG tablet Take 10 mg by mouth every morning.     [provider]  enzalutamide [name]) 40 MG capsule Take 160 mg by mouth daily. Takes 4 capsules daily    [provider]  ferrous sulfate 325 (65 FE) MG tablet Take 325 mg by mouth daily.  06/30/14   [provider]  HYDROcodone-acetaminophen (HYCET) 7.5-325 mg/15 ml solution Take 10 mLs by mouth 4 (four) times daily as needed for moderate pain (pain score 4-6). Please advise on constipation precautions 07/30/23   [name], MD  hydroxychloroquine (PLAQUENIL) 200 MG tablet Take 200 mg by mouth 2 (two) times daily.    [provider]  Boris Lown Oil 300 MG CAPS Take 300 mg by mouth daily.    [provider]  lidocaine (XYLOCAINE) 2 % solution Mix 1 to 1 ratio with Maalox to use for mouth pain/sores, use about 15 mL every 4 hours as needed 07/30/23   [name], MD  meloxicam (MOBIC) 7.5 MG tablet Take 7.5 mg by mouth 2 (two) times daily. 04/18/19   [provider]  Multiple Vitamin (MULTIVITAMIN WITH MINERALS) TABS tablet Take 1 tablet by mouth daily.    [provider]  pantoprazole (PROTONIX) 20 MG tablet Take 1 tablet (20 mg total) by mouth daily. 05/20/23  Aida Raider, NP  pantoprazole (PROTONIX) 40 MG tablet Take 1 tablet (40 mg total) by mouth daily. 30 minutes before breakfast 12/29/22   Brooke Bonito L, NP  prochlorperazine (COMPAZINE) 10 MG tablet TAKE 1 TABLET BY MOUTH EVERY 6 HOURS AS NEEDED FOR NAUSEA (TAKE 30 MINUTES BEFORE radiation) 08/13/23   Margaretmary Dys, MD  rosuvastatin (CRESTOR) 40 MG tablet TAKE 1 TABLET DAILY 01/30/23   Croitoru, Rachelle Hora, MD  sucralfate (CARAFATE) 1 g tablet Take 1 tablet (1 g total) by mouth 4 (four) times daily -  with meals and at bedtime. 07/07/23   Bruning, Ashlyn, PA-C  venlafaxine XR (EFFEXOR-XR) 75 MG 24 hr capsule Take 225 mg by mouth daily. 10/03/14   [provider]     Family History  Problem Relation Age of Onset   Diabetes Mother    Hypertension Mother    Heart disease Mother     Diabetes Father    Hypertension Father    Colon polyps Father        age 35   Colon polyps Sister    Hypertension Brother    Diabetes Brother    Cancer Paternal Uncle        unknown type   Prostate cancer Cousin 13       maternal first cousin, metastatic   Colon cancer Cousin        paternal first cousin    Social History   Socioeconomic History   Marital status: Married    Spouse name: Not on file   Number of children: Not on file   Years of education: Not on file   Highest education level: Not on file  Occupational History   Occupation: driving truck    Comment: retired  Tobacco Use   Smoking status: Former    Current packs/day: 0.00    Average packs/day: 0.3 packs/day for 43.0 years (10.8 ttl pk-yrs)    Types: Cigarettes    Start date: 02/28/1970    Quit date: 02/28/2013    Years since quitting: 10.6   Smokeless tobacco: Former    Quit date: 06/30/1980  Vaping Use   Vaping status: Never Used  Substance and Sexual Activity   Alcohol use: No   Drug use: No   Sexual activity: Yes    Birth control/protection: None  Other Topics Concern   Not on file  Social History Narrative   Not on file   Social Drivers of Health   Financial Resource Strain: Not on file  Food Insecurity: No Food Insecurity (06/01/2023)   Hunger Vital Sign    Worried About Running Out of Food in the Last Year: Never true    Ran Out of Food in the Last Year: Never true  Transportation Needs: No Transportation Needs (06/01/2023)   PRAPARE - Administrator, Civil Service (Medical): No    Lack of Transportation (Non-Medical): No  Physical Activity: Not on file  Stress: Not on file  Social Connections: Not on file      Review of Systems  Vital Signs:   Advance Care Plan: .  Physical Exam  Imaging: NM PET (PSMA) SKULL TO MID THIGH Result Date: 09/11/2023 CLINICAL DATA:  Prostate carcinoma.  Mesenteric metastasis. EXAM: NUCLEAR MEDICINE PET SKULL BASE TO THIGH TECHNIQUE: 8.1  mCi Flotufolastat (Posluma) was injected intravenously. Full-ring PET imaging was performed from the skull base to thigh after the radiotracer. CT data was obtained and used for attenuation correction and anatomic localization. COMPARISON:  None  Available. FINDINGS: NECK No radiotracer activity in neck lymph nodes. Incidental CT finding: None. CHEST No radiotracer accumulation within mediastinal or hilar lymph nodes. No suspicious pulmonary nodules on the CT scan. Incidental CT finding: None. ABDOMEN/PELVIS Prostate: No focal activity in prostatectomy bed. Lymph nodes: No radiotracer avid lymph nodes in the pelvis. Again demonstrated intense radiotracer avid periaortic retroperitoneal lymph nodes not changed from prior. For example 11 mm node on image 137 with SUV max equal 31.4 unchanged in size are intensity from comparison exam. Similar small radiotracer avid lymph nodes in the central mesentery at the takeoff of the SMV. More intensely radiotracer avid enlarged periportal lymph node deep to the pancreas with SUV max equal 42 also unchanged from prior. (Image 115. No new radiotracer avid or enlarged lymph nodes. Liver: No evidence of liver metastasis. Incidental CT finding: None. SKELETON Single new very small focus radiotracer activity anterior aspect of the RIGHT acetabulum SUV max equal 9.3 on image 191. There is a subtle sclerotic lesion measuring 5 mm. IMPRESSION: 1. Stable intensely radiotracer avid retroperitoneal and periportal lymph nodes consistent with prostate carcinoma nodal metastasis. No interval change. 2. No evidence of local recurrence in the prostatectomy bed. 3. No evidence of visceral metastasis. 4. Single new small focus of radiotracer activity in the RIGHT acetabulum is concerning oligometastatic skeletal metastasis. Electronically Signed   By: Genevive Bi M.D.   On: 09/11/2023 11:36    Labs:  CBC: Recent Labs    02/03/23 1115 04/30/23 1109 08/31/23 1209  WBC 5.7 5.2 6.7   HGB 12.1* 12.3* 11.3*  HCT 37.3* 37.9* 34.0*  PLT 281 286 260    COAGS: No results for input(s): "INR", "APTT" in the last 8760 hours.  BMP: Recent Labs    02/03/23 1115 04/30/23 1109 08/31/23 1209  NA 134* 139 138  K 3.7 4.3 4.1  CL 103 104 105  CO2 21* 29 27  GLUCOSE 143* 101* 124*  BUN 15 18 17   CALCIUM 9.1 9.9 10.1  CREATININE 0.83 0.88 0.76  GFRNONAA >60 >60 >60    LIVER FUNCTION TESTS: Recent Labs    04/30/23 1109 08/31/23 1209  BILITOT 0.3 0.4  AST 21 17  ALT 17 13  ALKPHOS 52 46  PROT 7.5 7.5  ALBUMIN 4.7 4.5    TUMOR MARKERS: No results for input(s): "AFPTM", "CEA", "CA199", "CHROMGRNA" in the last 8760 hours.  Assessment and Plan: 68 y.o. male with past medical history significant for anemia, anxiety, arthritis, depression, GERD, COPD, hypertension, hyperlipidemia, posttraumatic stress disorder, prediabetes, sleep apnea, urinary incontinence, tobacco abuse and metastatic prostate cancer with recent PET scan showing one new site of bone metastasis.  He is scheduled today for tunneled apheresis/dialysis catheter placement for leukapheresis/immunotherapy.  Details/risks of procedure, including but not limited to, internal bleeding, infection, injury to adjacent structures discussed with patient with his understanding and consent.   Thank you for allowing our service to participate in [name] 's care.  Electronically Signed: D. Jeananne Rama, PA-C   10/07/2023, 2:30 PM      I spent a total of  15 Minutes   in face to face in clinical consultation, greater than 50% of which was counseling/coordinating care for tunneled central venous catheter placement

## 2023-10-08 ENCOUNTER — Other Ambulatory Visit: Payer: Self-pay

## 2023-10-08 ENCOUNTER — Ambulatory Visit: Payer: Medicare Other

## 2023-10-08 ENCOUNTER — Ambulatory Visit (HOSPITAL_COMMUNITY)
Admission: RE | Admit: 2023-10-08 | Discharge: 2023-10-08 | Discharge status: Home / Self Care | Payer: Medicare Other | Source: Ambulatory Visit | Attending: Internal Medicine

## 2023-10-08 ENCOUNTER — Other Ambulatory Visit (HOSPITAL_COMMUNITY): Payer: Self-pay

## 2023-10-08 ENCOUNTER — Ambulatory Visit (HOSPITAL_COMMUNITY)
Admission: RE | Admit: 2023-10-08 | Discharge: 2023-10-08 | Disposition: A | Discharge status: Home / Self Care | Payer: Medicare Other | Source: Ambulatory Visit

## 2023-10-08 ENCOUNTER — Encounter (HOSPITAL_COMMUNITY): Payer: Self-pay

## 2023-10-08 DIAGNOSIS — C61 Malignant neoplasm of prostate: Secondary | ICD-10-CM

## 2023-10-08 DIAGNOSIS — Z8042 Family history of malignant neoplasm of prostate: Secondary | ICD-10-CM | POA: Insufficient documentation

## 2023-10-08 DIAGNOSIS — Z800 Family history of malignant neoplasm of digestive organs: Secondary | ICD-10-CM | POA: Diagnosis not present

## 2023-10-08 DIAGNOSIS — Z87891 Personal history of nicotine dependence: Secondary | ICD-10-CM | POA: Diagnosis not present

## 2023-10-08 DIAGNOSIS — C7951 Secondary malignant neoplasm of bone: Secondary | ICD-10-CM | POA: Diagnosis not present

## 2023-10-08 HISTORY — PX: IR US GUIDE VASC ACCESS RIGHT: IMG2390

## 2023-10-08 HISTORY — PX: IR FLUORO GUIDE CV LINE RIGHT: IMG2283

## 2023-10-08 LAB — CBC WITH DIFFERENTIAL/PLATELET
Abs Immature Granulocytes: 0.02 10*3/uL (ref 0.00–0.07)
Basophils Absolute: 0 10*3/uL (ref 0.0–0.1)
Basophils Relative: 0 %
Eosinophils Absolute: 0.4 10*3/uL (ref 0.0–0.5)
Eosinophils Relative: 11 %
HCT: 30.6 % — ABNORMAL LOW (ref 39.0–52.0)
Hemoglobin: 9.9 g/dL — ABNORMAL LOW (ref 13.0–17.0)
Immature Granulocytes: 1 %
Lymphocytes Relative: 15 %
Lymphs Abs: 0.6 10*3/uL — ABNORMAL LOW (ref 0.7–4.0)
MCH: 34 pg (ref 26.0–34.0)
MCHC: 32.4 g/dL (ref 30.0–36.0)
MCV: 105.2 fL — ABNORMAL HIGH (ref 80.0–100.0)
Monocytes Absolute: 0.5 10*3/uL (ref 0.1–1.0)
Monocytes Relative: 12 %
Neutro Abs: 2.3 10*3/uL (ref 1.7–7.7)
Neutrophils Relative %: 61 %
Platelets: 242 10*3/uL (ref 150–400)
RBC: 2.91 MIL/uL — ABNORMAL LOW (ref 4.22–5.81)
RDW: 12.6 % (ref 11.5–15.5)
WBC: 3.8 10*3/uL — ABNORMAL LOW (ref 4.0–10.5)
nRBC: 0 % (ref 0.0–0.2)

## 2023-10-08 LAB — BASIC METABOLIC PANEL
Anion gap: 8 (ref 5–15)
BUN: 20 mg/dL (ref 8–23)
CO2: 24 mmol/L (ref 22–32)
Calcium: 9.3 mg/dL (ref 8.9–10.3)
Chloride: 106 mmol/L (ref 98–111)
Creatinine, Ser: 0.7 mg/dL (ref 0.61–1.24)
GFR, Estimated: >60 mL/min (ref >60)
Glucose, Bld: 127 mg/dL — ABNORMAL HIGH (ref 70–99)
Potassium: 4 mmol/L (ref 3.5–5.1)
Sodium: 138 mmol/L (ref 135–145)

## 2023-10-08 LAB — PROTIME-INR
INR: 1 (ref 0.8–1.2)
Prothrombin Time: 13.7 s (ref 11.4–15.2)

## 2023-10-08 LAB — GLUCOSE, CAPILLARY: Glucose-Capillary: 123 mg/dL — ABNORMAL HIGH (ref 70–99)

## 2023-10-08 MED ORDER — LIDOCAINE-EPINEPHRINE 1 %-1:100000 IJ SOLN
20.0000 mL | Freq: Once | INTRAMUSCULAR | Status: AC
  Administered 2023-10-08: 10 mL via INTRADERMAL

## 2023-10-08 MED ORDER — VANCOMYCIN HCL IN DEXTROSE 1-5 GM/200ML-% IV SOLN: 1000.0000 mg | INTRAVENOUS | Status: DC

## 2023-10-08 MED ORDER — MIDAZOLAM HCL 2 MG/2ML IJ SOLN
INTRAMUSCULAR | Status: AC
  Filled 2023-10-08: qty 2

## 2023-10-08 MED ORDER — LIDOCAINE-EPINEPHRINE 1 %-1:100000 IJ SOLN
INTRAMUSCULAR | Status: AC
  Filled 2023-10-08: qty 1

## 2023-10-08 MED ORDER — SODIUM CHLORIDE 0.9 % IV SOLN: INTRAVENOUS | Status: DC

## 2023-10-08 MED ORDER — VANCOMYCIN HCL IN DEXTROSE 1-5 GM/200ML-% IV SOLN
INTRAVENOUS | Status: AC
  Filled 2023-10-08: qty 200

## 2023-10-08 MED ORDER — HEPARIN SOD (PORK) LOCK FLUSH 100 UNIT/ML IV SOLN
INTRAVENOUS | Status: AC
  Filled 2023-10-08: qty 5

## 2023-10-08 MED ORDER — GELATIN ABSORBABLE 12-7 MM EX MISC
1.0000 | Freq: Once | CUTANEOUS | Status: AC
  Administered 2023-10-08: 1

## 2023-10-08 MED ORDER — VANCOMYCIN HCL IN DEXTROSE 1-5 GM/200ML-% IV SOLN
INTRAVENOUS | Status: DC | PRN
  Administered 2023-10-08: 1000 mg via INTRAVENOUS

## 2023-10-08 MED ORDER — MIDAZOLAM HCL 2 MG/2ML IJ SOLN
INTRAMUSCULAR | Status: DC | PRN
Start: 2023-10-08 — End: 2023-10-09
  Administered 2023-10-08: .5 mg via INTRAVENOUS
  Administered 2023-10-08: 1 mg via INTRAVENOUS

## 2023-10-08 MED ORDER — FENTANYL CITRATE (PF) 100 MCG/2ML IJ SOLN
INTRAMUSCULAR | Status: DC | PRN
  Administered 2023-10-08: 25 ug via INTRAVENOUS
  Administered 2023-10-08: 50 ug via INTRAVENOUS

## 2023-10-08 MED ORDER — ENZALUTAMIDE 40 MG PO CAPS
160.0000 mg | ORAL_CAPSULE | Freq: Every day | ORAL | 11 refills | Status: DC
  Filled 2023-10-20: qty 120, 30d supply, fill #0
  Filled 2023-11-18: qty 120, 30d supply, fill #1

## 2023-10-08 MED ORDER — HEPARIN SOD (PORK) LOCK FLUSH 100 UNIT/ML IV SOLN
500.0000 [IU] | Freq: Once | INTRAVENOUS | Status: AC
  Administered 2023-10-08: 320 [IU] via INTRAVENOUS

## 2023-10-08 MED ORDER — FENTANYL CITRATE (PF) 100 MCG/2ML IJ SOLN
INTRAMUSCULAR | Status: AC
  Filled 2023-10-08: qty 2

## 2023-10-08 NOTE — Discharge Instructions (Addendum)
Please call Interventional Radiology clinic [phone] with any questions or concerns.  You may remove your dressing and shower tomorrow.  DO NOT use EMLA cream on your port site for 2 weeks as this cream will remove surgical glue on your incision. No creams, ointments, or lotions to site area until site is healed.

## 2023-10-08 NOTE — Progress Notes (Signed)
Xtandi refills signed

## 2023-10-08 NOTE — Procedures (Signed)
Interventional Radiology Procedure Note  Procedure: Placement of a right IJ approach HD cath, for plasmapheresis. Tip is positioned at the superior cavoatrial junction and catheter is ready for immediate use.   Complications: None  Recommendations:  - Ok to use - Do not submerge - Routine line care - advance diet - ok to restart all home meds   Signed,  Yvone Neu. Loreta Ave, DO

## 2023-10-09 NOTE — Progress Notes (Addendum)
Patient had tunneled apheresis/dialysis catheter placement on 2/13 for access for upcoming leukapheresis/immunotherapy.   [facility].   RN spoke with patient and reviewed/confirmed the following appointments in detail:   2/24 Cell Collection at the [facility] 2/27 Infusion of Provenge  3/3 Cell Collection at the [facility] 3/6 MD Follow up and Infusion of Provenge  3/10 Cell Collection at the [facility] 3/13 Infusion of Provenge   All questions answered.  RN encouraged patient to call with any questions or barriers that may arise prior to infusion.

## 2023-10-15 NOTE — Progress Notes (Addendum)

## 2023-10-16 ENCOUNTER — Emergency Department (HOSPITAL_COMMUNITY): Payer: Medicare Other

## 2023-10-16 ENCOUNTER — Emergency Department (HOSPITAL_COMMUNITY)
Admission: EM | Admit: 2023-10-16 | Discharge: 2023-10-16 | Disposition: A | Discharge status: Home / Self Care | Payer: Medicare Other | Attending: Emergency Medicine | Admitting: Emergency Medicine

## 2023-10-16 ENCOUNTER — Other Ambulatory Visit: Payer: Self-pay

## 2023-10-16 DIAGNOSIS — L0390 Cellulitis, unspecified: Secondary | ICD-10-CM | POA: Insufficient documentation

## 2023-10-16 MED ORDER — DOXYCYCLINE HYCLATE 100 MG PO CAPS: 100.0000 mg | ORAL_CAPSULE | Freq: Two times a day (BID) | ORAL | 0 refills | Status: DC

## 2023-10-16 NOTE — ED Provider Notes (Signed)
 [facility] EMERGENCY DEPARTMENT AT [facility] Provider Note   CSN: [id number] Arrival date & time: 10/16/23  1804     History  Chief Complaint  Patient presents with   port not flushing    [name] is a 68 y.o. male.  68 year old male presents with concern for possible port site infection.  Patient has a vascular access catheter on the right side of his chest due to the immunotherapy.  Denies any fever or chills.  States he otherwise feels at his baseline.  Was seen by home health nurse who thought it might be infected and patient sent here for further evaluation       Home Medications Prior to Admission medications   Medication Sig Start Date End Date Taking? Authorizing Provider  ARIPiprazole (ABILIFY) 10 MG tablet Take 10 mg by mouth at bedtime. 04/13/18   [provider]  Choline Fenofibrate 135 MG capsule Take 135 mg by mouth at bedtime. trilipix    [provider]  Coenzyme Q10 (CO Q 10) 100 MG CAPS Take 100 mg by mouth every evening.    [provider]  desipramine (NOPRAMIN) 100 MG tablet Take 100 mg by mouth at bedtime.  09/04/14   [provider]  enalapril (VASOTEC) 10 MG tablet Take 10 mg by mouth every morning.     [provider]  enzalutamide [name]) 40 MG capsule Take 4 capsules (160 mg total) by mouth daily. Takes 4 capsules daily 10/08/23   [name], MD  ferrous sulfate 325 (65 FE) MG tablet Take 325 mg by mouth daily.  06/30/14   [provider]  HYDROcodone-acetaminophen (HYCET) 7.5-325 mg/15 ml solution Take 10 mLs by mouth 4 (four) times daily as needed for moderate pain (pain score 4-6). Please advise on constipation precautions 07/30/23   [name], MD  hydroxychloroquine (PLAQUENIL) 200 MG tablet Take 200 mg by mouth 2 (two) times daily.    [provider]  Boris Lown Oil 300 MG CAPS Take 300 mg by mouth daily.    [provider]  lidocaine (XYLOCAINE) 2 %  solution Mix 1 to 1 ratio with Maalox to use for mouth pain/sores, use about 15 mL every 4 hours as needed 07/30/23   [name], MD  meloxicam (MOBIC) 7.5 MG tablet Take 7.5 mg by mouth 2 (two) times daily. 04/18/19   [provider]  Multiple Vitamin (MULTIVITAMIN WITH MINERALS) TABS tablet Take 1 tablet by mouth daily.    [provider]  pantoprazole (PROTONIX) 20 MG tablet Take 1 tablet (20 mg total) by mouth daily. 05/20/23   Aida Raider, NP  pantoprazole (PROTONIX) 40 MG tablet Take 1 tablet (40 mg total) by mouth daily. 30 minutes before breakfast 12/29/22   Brooke Bonito L, NP  prochlorperazine (COMPAZINE) 10 MG tablet TAKE 1 TABLET BY MOUTH EVERY 6 HOURS AS NEEDED FOR NAUSEA (TAKE 30 MINUTES BEFORE radiation) 08/13/23   Margaretmary Dys, MD  rosuvastatin (CRESTOR) 40 MG tablet TAKE 1 TABLET DAILY 01/30/23   Croitoru, Rachelle Hora, MD  sucralfate (CARAFATE) 1 g tablet Take 1 tablet (1 g total) by mouth 4 (four) times daily -  with meals and at bedtime. 07/07/23   Bruning, Ashlyn, PA-C  venlafaxine XR (EFFEXOR-XR) 75 MG 24 hr capsule Take 225 mg by mouth daily. 10/03/14   [provider]      Allergies    Penicillins    Review of Systems   Review of Systems  All other systems reviewed and are negative.   Physical Exam Updated Vital Signs BP (!) 158/93   Pulse 99   Temp 98.4 F (36.9 C) (Oral)   Resp 16   Wt 115 kg   SpO2 97%   BMI 32.55 kg/m  Physical Exam Vitals and nursing note reviewed.  Constitutional:      General: He is not in acute distress.    Appearance: Normal appearance. He is well-developed. He is not toxic-appearing.  HENT:     Head: Normocephalic and atraumatic.  Eyes:     General: Lids are normal.     Conjunctiva/sclera: Conjunctivae normal.     Pupils: Pupils are equal, round, and reactive to light.  Neck:     Thyroid: No thyroid mass.     Trachea: No tracheal deviation.  Cardiovascular:     Rate and Rhythm: Normal rate and  regular rhythm.     Heart sounds: Normal heart sounds. No murmur heard.    No gallop.  Pulmonary:     Effort: Pulmonary effort is normal. No respiratory distress.     Breath sounds: Normal breath sounds. No stridor. No decreased breath sounds, wheezing, rhonchi or rales.  Chest:    Abdominal:     General: There is no distension.     Palpations: Abdomen is soft.     Tenderness: There is no abdominal tenderness. There is no rebound.  Musculoskeletal:        General: No tenderness. Normal range of motion.     Cervical back: Normal range of motion and neck supple.  Skin:    General: Skin is warm and dry.     Findings: No abrasion or rash.  Neurological:     Mental Status: He is alert and oriented to person, place, and time. Mental status is at baseline.     GCS: GCS eye subscore is 4. GCS verbal subscore is 5. GCS motor subscore is 6.     Cranial Nerves: No cranial nerve deficit.     Sensory: No sensory deficit.     Motor: Motor function is intact.  Psychiatric:        Attention and Perception: Attention normal.        Speech: Speech normal.        Behavior: Behavior normal.     ED Results / Procedures / Treatments   Labs (all labs ordered are listed, but only abnormal results are displayed) Labs Reviewed - No data to display  EKG None  Radiology No results found.  Procedures Procedures    Medications Ordered in ED Medications - No data to display  ED Course/ Medical Decision Making/ A&P                                 Medical Decision Making Amount and/or Complexity of Data Reviewed Radiology: ordered.   Patient will likely begin cellulitis and will place patient on doxycycline        Final Clinical Impression(s) / ED Diagnoses Final diagnoses:  None    Rx / DC Orders ED Discharge Orders     None         Lorre Nick, MD 10/16/23 1954

## 2023-10-16 NOTE — ED Triage Notes (Signed)
 Pt arrived via POV. C/o newly placed CVC not flushing, [facility] RN concerned for site infection- no redness, swelling, or drainage noted through tegaderm.

## 2023-10-19 ENCOUNTER — Other Ambulatory Visit: Payer: Medicare Other

## 2023-10-19 ENCOUNTER — Inpatient Hospital Stay: Payer: Medicare Other

## 2023-10-19 VITALS — BP 108/76 | HR 90 | Temp 98.9°F | Resp 20

## 2023-10-19 DIAGNOSIS — C61 Malignant neoplasm of prostate: Secondary | ICD-10-CM | POA: Diagnosis not present

## 2023-10-19 DIAGNOSIS — C7951 Secondary malignant neoplasm of bone: Secondary | ICD-10-CM | POA: Diagnosis not present

## 2023-10-19 DIAGNOSIS — Z5111 Encounter for antineoplastic chemotherapy: Secondary | ICD-10-CM | POA: Diagnosis present

## 2023-10-19 MED ORDER — HEPARIN SODIUM (PORCINE) 1000 UNIT/ML IJ SOLN: 1600.0000 [IU] | Freq: Once | INTRAMUSCULAR | Status: DC

## 2023-10-19 MED ORDER — ALTEPLASE 2 MG IJ SOLR
2.0000 mg | Freq: Once | INTRAMUSCULAR | Status: AC
Start: 2023-10-19 — End: 2023-10-19
  Administered 2023-10-19: 2 mg
  Filled 2023-10-19: qty 2

## 2023-10-19 NOTE — Progress Notes (Signed)
 Patient was seen at [facility] on Friday, 10/16/23 for scheduled dressing change. Per report, staff was unable to obtain blood return from catheter. Patient was instructed to go to ED for evaluation. Mr. Saxton was seen in the ED and prescribed doxycycline. Per the patient, the ED staff did not attempt to obtain blood return. Mr. Godley came to [facility] this am for evaluation and possible treatment of non-functioning catheter. The dressing over his site was not intact. Dressing change completed. Brisk blood return obtained from blue limb. No blood return from red limb. Alteplase instilled via red limb only. Brisk blood return from the red limb after initial dwell time. Flushed both limbs with normal saline. Obtained heparin (10,000 units/10 mL vial) from main hospital pharmacy. Warning present on vial - "NOT for lock flush." Called main hospital pharmacy for direction. The pharmacist was unable to provide clarification. [name], RN spoke with staff at [facility] who advised to send patient to them without heparinizing. Patient was discharged in stable condition with no complaints. He and his wife are aware they are to proceed to [facility] at time of discharge.

## 2023-10-19 NOTE — Patient Instructions (Signed)
 Today, we changed the dressing over your catheter and gave you a medication to restore function to your catheter. Both of these were successful. Please go to the [employer] for the cell collection process as soon as you leave our facility. If you experience any concerns, please do not hesitate to call the Cancer Center. See below for number and further instructions.   [facility] - A DEPT OF [facility]  Discharge Instructions: Thank you for choosing [facility] to provide your oncology and hematology care.   If you have a lab appointment with the Cancer Center, please go directly to the Cancer Center and check in at the registration area.   Wear comfortable clothing and clothing appropriate for easy access to your apheresis catheter.   We strive to give you quality time with your provider. You may need to reschedule your appointment if you arrive late (15 or more minutes).  Arriving late affects you and other patients whose appointments are after yours.  Also, if you miss three or more appointments without notifying the office, you may be dismissed from the clinic at the provider's discretion.      For prescription refill requests, have your pharmacy contact our office and allow 72 hours for refills to be completed.    BELOW ARE SYMPTOMS THAT SHOULD BE REPORTED IMMEDIATELY: *FEVER GREATER THAN 100.4 F (38 C) OR HIGHER *CHILLS OR SWEATING *NAUSEA AND VOMITING THAT IS NOT CONTROLLED WITH YOUR NAUSEA MEDICATION *UNUSUAL SHORTNESS OF BREATH *UNUSUAL BRUISING OR BLEEDING *URINARY PROBLEMS (pain or burning when urinating, or frequent urination) *BOWEL PROBLEMS (unusual diarrhea, constipation, pain near the anus) TENDERNESS IN MOUTH AND THROAT WITH OR WITHOUT PRESENCE OF ULCERS (sore throat, sores in mouth, or a toothache) UNUSUAL RASH, SWELLING OR PAIN  UNUSUAL VAGINAL DISCHARGE OR ITCHING   Items with * indicate a potential emergency and  should be followed up as soon as possible or go to the Emergency Department if any problems should occur.  Should you have questions after your visit or need to cancel or reschedule your appointment, please contact [facility] - A DEPT OF [facility]  Dept: [phone]  and follow the prompts.  Office hours are 8:00 a.m. to 4:30 p.m. Monday - Friday. Please note that voicemails left after 4:00 p.m. may not be returned until the following business day.  We are closed weekends and major holidays. You have access to a nurse at all times for urgent questions. Please call the main number to the clinic Dept: [phone] and follow the prompts.   For any non-urgent questions, you may also contact your provider using MyChart. We now offer e-Visits for anyone 33 and older to request care online for non-urgent symptoms. For details visit [email or web address].   Also download the MyChart app! Go to the app store, search "MyChart", open the app, select [facility], and log in with your MyChart username and password.  Central Line in Adults: What to Expect A central line is a soft tube that is put into a vein so that it goes to a large vein above your heart. It can be used to: Give you medicine or fluids. Give you nutrients. Take blood or give you blood for testing or treatments. Give chemotherapy or dialysis. Provide IV access if veins in your hands or arms are difficult to use. Types of central lines There are four main types of central  lines: Peripherally inserted central catheter (PICC) line. This type is usually put in the upper arm and goes up the arm to the vein above your heart. This is used for one week or longer. Tunneled central line. This type is placed in a large vein in the neck, chest, or groin. It is tunneled under the skin and brought out through a small cut in your skin. Non-tunneled central line. This type is used for a shorter time than other  types, usually for 7 days at the most. It is put in the neck, chest, or groin. Implanted port. This type can stay in place longer than other types of central lines. It is normally put in the upper chest but can also be placed in the upper arm or the belly. Surgery is needed to put it in and take it out. The type of central line you get will depend on how long you need it and your medical condition. Tell a doctor about: Any allergies you have. All medicines you take. These include vitamins, herbs, eye drops, and creams. Any problems you or family members have had with anesthesia. Any bleeding problems you have. Any surgeries you have had. Any medical conditions you have. Whether you're pregnant or may be pregnant. What are the risks? Your doctor will talk with you about risks. These may include: Infection. A blood clot. Bleeding. Getting a hole or crack in the central line. If this happens, the central line will need to be replaced. The catheter moving or coming out of place. A collapsed lung. Damage to other structures or organs. What happens before the procedure? Medicines Ask about changing or stopping: Any medicines you take. Any vitamins, herbs, or supplements you take. Do not take aspirin or ibuprofen unless you're told to. Surgery safety For your safety, you may: Need to wash your skin with a soap that kills germs. Get antibiotics. Have your procedure site marked. Have hair removed at the procedure site. General instructions Eat and drink as told. Plan to have a responsible adult take you home from the hospital or clinic. Ask if you'll be staying overnight in the hospital. If you'll be going home right after the procedure, plan to have a responsible adult: Drive you home from the hospital or clinic. You won't be allowed to drive. Stay with you for the time you're told. What happens during the procedure? An IV will be put into one of your veins. You may be given: A  sedative to help you relax. Anesthesia to keep you from feeling pain. Your skin will be cleaned with a soap that kills germs, and you may be covered with clean sheet called a drape. Your blood pressure, heart rate, breathing rate, and blood oxygen level will be checked during the procedure. The central line will be put into the vein and moved through it to the correct spot. The doctor may use X-ray equipment to help guide the central line to the right place. A bandage will be placed over the insertion area. These steps may vary. Ask what you can expect. What can I expect after the procedure? You will watched closely until you leave. This includes checking your pain level, blood pressure, heart rate, and breathing rate. Caps may be put on the ends of the central line tubes. If you were given a sedative, do not drive or use machines until you're told it's safe. A sedative can make you sleepy. Follow these instructions at home: Caring for the tube  Follow  instructions from your doctor about: Flushing the tube. Cleaning the tube and the area around it. Only use germ-free, also called sterile, supplies to flush the central line. The supplies should be from your doctor, a pharmacy, or another place that your doctor recommends. Before you flush the tube or clean the area around it: Wash your hands with soap and water for at least 20 seconds. If you can't use soap and water, use hand sanitizer. Clean the central line hub with rubbing alcohol. To do this: Clean the central line hub with a new alcohol wipe. Scrub it using a twisting motion and rub for 10 to 15 seconds or for 30 twists. Follow the manufacturer's instructions. Be sure you scrub the top of the hub, not just the sides. Let the hub dry before use. Keep it from touching anything while drying. Do not re-use alcohol pads. Caring for your skin Check the skin around the central line every day for signs of infection. Check for: Redness,  swelling, or pain. Fluid or blood. Warmth. Pus or a bad smell. Keep the area where the tube was put in clean and dry. Change bandages only as told by your doctor. Keep your bandage dry. If a bandage gets wet, have it changed right away. Storing and throwing away supplies Keep your supplies in a clean, dry location. Throw away any used syringes in a container that is only for sharp items, called a sharps container. You can buy a sharps container from a pharmacy, or you can make one by using an empty hard plastic bottle with a cover. Place any used bandages or infusion bags into a plastic bag. Throw that bag in the trash. General instructions Follow instructions from your doctor for the type of device that you have. Keep the tube clamped unless it's being used. If you or someone else accidentally pulls on the tube, make sure: The bandage is OK. There is no bleeding. The tube has not been pulled out. Do not use scissors or sharp objects near the tube. Do not take baths, swim, or use a hot tub until you're told it's OK. Ask if you can shower. Ask your doctor what activities are safe for you. Your doctor may tell you not to lift anything or move your arm too much on the side of your central line. Contact a doctor if: You have signs of an infection where the tube was put in. The skin is irritated near the bandage. You catheter appears to be getting longer. This may mean it is coming out of the vein. Get help right away if: You have: A fever or chills. Shortness of breath. Pain in your chest. A fast heartbeat. Swelling in your neck, face, chest, or arm. You feel dizzy or you faint. There are red lines coming from where the tube was put in. The area where the tube was put in is bleeding and the bleeding won't stop. Your tube is hard to flush. You do not get a blood return from the tube. The tube gets loose or comes out. The tube has a hole or a tear. The tube leaks. This information  is not intended to replace advice given to you by your health care provider. Make sure you discuss any questions you have with your health care provider. Document Revised: 03/09/2023 Document Reviewed: 03/09/2023 Elsevier Patient Education  2024 [employer].

## 2023-10-19 NOTE — Progress Notes (Signed)
 Patient presented to [facility] on 2/21 for concerns of needing a dressing change to his dialysis cath.   Per [employer] RN's patient sent to ED for assessment of possible infection and they were unable to obtain blood return from cath.  Patient started doxycycline per ED notes.  RN reviewed with clinical nurse educator and patient will be assessed this morning for blood return prior to upcoming [facility] appointment for apheresis.

## 2023-10-19 NOTE — Progress Notes (Signed)
 Per Dr. Cherly Hensen, patient should not take doxycycline since he has not received it from his mail order pharmacy yet and there is no evidence of infection. [name], RN will notify the patient that he does not need to take the doxycycline.

## 2023-10-19 NOTE — Addendum Note (Signed)
 Addended by: Cristopher Peru on: 10/19/2023 05:15 PM   Modules accepted: Orders

## 2023-10-20 ENCOUNTER — Other Ambulatory Visit: Payer: Self-pay | Admitting: Pharmacy Technician

## 2023-10-20 ENCOUNTER — Other Ambulatory Visit (HOSPITAL_COMMUNITY): Payer: Self-pay

## 2023-10-20 ENCOUNTER — Ambulatory Visit: Payer: Medicare Other

## 2023-10-20 ENCOUNTER — Other Ambulatory Visit: Payer: Self-pay

## 2023-10-20 NOTE — Progress Notes (Signed)
 RN spoke with patient and informed per MD recommendations no need to start doxycycline based on site assessment of cath on 2/24.  Patient verbalized understanding.    Patient had successful apheresis at the [facility] on 2/24, and will present for first infusion of Provenge Thursday 2/27.     No additional needs at this time, plan of care in progress.

## 2023-10-20 NOTE — Progress Notes (Signed)
 Specialty Pharmacy Initiation Note   [name] is a 68 y.o. male who will be followed by the specialty pharmacy service for [facility] Oncology    Review of administration, indication, effectiveness, safety, potential side effects, storage/disposable, and missed dose instructions occurred today for patient's specialty medication(s) Enzalutamide [name])     Patient/Caregiver did not have any additional questions or concerns.   Patient's therapy is appropriate to: Continue Patient has been receiving therapy from [facility] and requested to receive through [facility] specialty pharmacy.    Goals Addressed             This Visit's Progress    Maintain optimal adherence to therapy       Patient is on track. Patient will maintain adherence        Bethel Born, PharmD Hematology/Oncology Clinical Pharmacist [facility] [phone]

## 2023-10-20 NOTE — Progress Notes (Signed)
 Specialty Pharmacy Initial Fill Coordination Note  [name] is a 68 y.o. male contacted today regarding refills of specialty medication(s) Enzalutamide [name]) .  Patient requested Delivery  on 11/02/23  to verified address [address]  [place] [address]   Medication will be filled on 10/30/23.   Patient is aware of $0 copayment.

## 2023-10-22 ENCOUNTER — Inpatient Hospital Stay: Payer: Medicare Other

## 2023-10-22 VITALS — BP 122/79 | HR 89 | Temp 98.7°F | Resp 18 | Wt 255.5 lb

## 2023-10-22 DIAGNOSIS — C61 Malignant neoplasm of prostate: Secondary | ICD-10-CM

## 2023-10-22 DIAGNOSIS — Z5111 Encounter for antineoplastic chemotherapy: Secondary | ICD-10-CM | POA: Diagnosis not present

## 2023-10-22 MED ORDER — HEPARIN SODIUM (PORCINE) 1000 UNIT/ML IJ SOLN
1.6000 mL | Freq: Once | INTRAMUSCULAR | Status: AC
Start: 2023-10-22 — End: 2023-10-22
  Administered 2023-10-22: 1600 [IU]
  Filled 2023-10-22: qty 1.6

## 2023-10-22 MED ORDER — SODIUM CHLORIDE 0.9% FLUSH
10.0000 mL | Freq: Once | INTRAVENOUS | Status: AC
  Administered 2023-10-22: 10 mL via INTRAVENOUS

## 2023-10-22 MED ORDER — SIPULEUCEL-T IV INFUSION
250.0000 mL | Freq: Once | INTRAVENOUS | Status: AC
  Administered 2023-10-22: 250 mL via INTRAVENOUS
  Filled 2023-10-22: qty 250

## 2023-10-22 MED ORDER — DIPHENHYDRAMINE HCL 50 MG/ML IJ SOLN
50.0000 mg | Freq: Once | INTRAMUSCULAR | Status: DC
  Filled 2023-10-22: qty 1

## 2023-10-22 MED ORDER — SODIUM CHLORIDE 0.9 % IV SOLN
INTRAVENOUS | Status: DC
Start: 2023-10-22 — End: 2023-10-22

## 2023-10-22 MED ORDER — DIPHENHYDRAMINE HCL 25 MG PO CAPS
25.0000 mg | ORAL_CAPSULE | Freq: Once | ORAL | Status: AC
  Administered 2023-10-22: 25 mg via ORAL
  Filled 2023-10-22: qty 1

## 2023-10-22 MED ORDER — ACETAMINOPHEN 325 MG PO TABS
650.0000 mg | ORAL_TABLET | Freq: Once | ORAL | Status: AC
  Administered 2023-10-22: 650 mg via ORAL
  Filled 2023-10-22: qty 2

## 2023-10-22 NOTE — Progress Notes (Signed)
 Pt reported to infusion today for first time Provenge. Pt had c/o new onset rash with itchiness around apheresis catheter site. This RN assessed site and found rash to be red, and patchy under Pt's dressing. Pt stated, "I put hydrocortisone cream on it, and it helped a lot". This RN made Chyrl Civatte RN aware. Chyrl Civatte RN assessed site. Tim N RN changed Pt's catheter dressing prior to tx today.

## 2023-10-22 NOTE — Patient Instructions (Signed)
 Sipuleucel-T injection What is this medication? SIPULEUCEL-T (SI pu LOO sel - tee) is a vaccine for the treatment of prostate cancer. It is made from cells in your immune system. This medicine may be used for other purposes; ask your health care provider or pharmacist if you have questions. This medicine may be used for other purposes; ask your health care provider or pharmacist if you have questions. COMMON BRAND NAME(S): Provenge What should I tell my care team before I take this medication? They need to know if you have any of these conditions: heart disease lung or breathing disease, like asthma an unusual or allergic reaction to sipuleucel-T, other medicines, foods, dyes, or preservatives pregnant or trying to get pregnant breast-feeding How should I use this medication? This medicine is for infusion into a vein. It is given by a health care professional in a hospital or clinic setting. This medicine is made from your own immune cells. Your cells will be collected at a cell collection center approximately 3 days before each infusion of this medicine. Your collected cells are then sent to a special center where they are mixed with a protein to get them ready for your infusion. Talk to your pediatrician regarding the use of this medicine in children. Special care may be needed. Overdosage: If you think you have taken too much of this medicine contact a poison control center or emergency room at once. NOTE: This medicine is only for you. Do not share this medicine with others. Overdosage: If you think you have taken too much of this medicine contact a poison control center or emergency room at once. NOTE: This medicine is only for you. Do not share this medicine with others. What if I miss a dose? It is important not to miss your dose. Call your doctor or health care professional if you are unable to keep an appointment. If you miss an appointment for your infusion of this medicine, your dose  will not be able to be used. What may interact with this medication? medicines that suppress your immune system like some medicines for cancer steroid medicines like prednisone or cortisone This list may not describe all possible interactions. Give your health care provider a list of all the medicines, herbs, non-prescription drugs, or dietary supplements you use. Also tell them if you smoke, drink alcohol, or use illegal drugs. Some items may interact with your medicine. This list may not describe all possible interactions. Give your health care provider a list of all the medicines, herbs, non-prescription drugs, or dietary supplements you use. Also tell them if you smoke, drink alcohol, or use illegal drugs. Some items may interact with your medicine. What should I watch for while using this medication? Report any side effects that you notice during your treatment right away, such as changes in your breathing, fever, or chills. These may also occur after your infusion. Tell your doctor or health care professional if you have any other reaction or unusual symptom after getting this medicine. What side effects may I notice from receiving this medication? Side effects that you should report to your doctor or health care professional as soon as possible: allergic reactions like skin rash, itching or hives, swelling of the face, lips, or tongue breathing problems chest pain or palpitations dizziness fast,irregular heartbeat fever nausea, vomiting Side effects that usually do not require medical attention (report to your doctor or health care professional if they continue or are bothersome): back pain fatigue headache joint pain This list  may not describe all possible side effects. Call your doctor for medical advice about side effects. You may report side effects to FDA at 1-800-FDA-1088. This list may not describe all possible side effects. Call your doctor for medical advice about side  effects. You may report side effects to FDA at 1-800-FDA-1088. Where should I keep my medication? This drug is only given in a hospital or clinic and will not be stored at home. NOTE: This sheet is a summary. It may not cover all possible information. If you have questions about this medicine, talk to your doctor, pharmacist, or health care provider.  2024 Elsevier/Gold Standard (2015-09-13 00:00:00)

## 2023-10-22 NOTE — Progress Notes (Signed)
 Pt observed for 30 minutes post Provenge infusion. Pt tolerated Tx well w/out incident. VSS at discharge.  Ambulatory w/ walker to lobby.

## 2023-10-23 NOTE — Telephone Encounter (Signed)
 Called pt to see how he did with his recent treatment.  He reports doing well & denies any problems.  He report apheresis cath is ok & site looks better.  He will be in on Monday for new treatment & knows to call with any problems.

## 2023-10-23 NOTE — Telephone Encounter (Signed)
-----   Message from Nurse Reece Levy sent at 10/22/2023  3:04 PM EST ----- Regarding: Dr. Cherly Hensen 1st time Provenge f/u tol well Dr. Cherly Hensen 1st time Provenge infusion. Pt tolerated tx well without incident. Pt call back due, can you please ask Pt about irritation under catheter dressing, see progress note for details, sensitive dressing was placed on 10/22/23.

## 2023-10-26 ENCOUNTER — Telehealth: Payer: Self-pay

## 2023-10-26 NOTE — Telephone Encounter (Signed)
 [name]

## 2023-10-27 ENCOUNTER — Inpatient Hospital Stay: Payer: Medicare Other

## 2023-10-27 ENCOUNTER — Other Ambulatory Visit: Payer: Self-pay

## 2023-10-28 ENCOUNTER — Telehealth: Payer: Self-pay

## 2023-10-28 NOTE — Progress Notes (Unsigned)
 Patient Care Team: Michael Nevins, MD as PCP - General (Internal Medicine) Michael Fair, MD as PCP - Cardiology (Cardiology) Michael Bihari, MD as PCP - Sleep Medicine (Cardiology) Michael Cushing, RN as Oncology Nurse Navigator  Clinic Day:  10/29/2023  Referring physician: [name], MD  ASSESSMENT & PLAN:   Assessment & Plan: [name] is a very pleasant 68 year old male with history of hypertension, hyperlipidemia, COPD, prostate cancer being follow-up for prostate cancer.  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   Small volume progression after recent radiation. Started sipuleucel-T.  Tolerating well so far.  May proceed weekly, remove central line after third infusion.  Diagnosis: M1 CRPC Germline: Ambry. NEG Somatic: TMB 2.7 m/MB. MSI high not detected. No actionable mutation. Previous treatment:  2007 prostatectomy  May 2010 salvage radiation.   08/2018- current ADT/ARPI.  10-06/2023 Palliative radiation to oliogoprogression to lymph nodes in the mediastinum and abdomen   Prostate cancer metastatic to bone [facility]) Continue ADT with enzalutamide  Sipuleucel-T Repeat lab next Month, ordered.  Low serum vitamin B12 Continue B12 500 mcg daily  Repeat next month    The patient understands the plans discussed today and is in agreement with them.  He knows to contact our office if he develops concerns prior to his next appointment.  Michael Sartorius, MD  [name] - A DEPT OF [facility] [address] [place] [address] Dept: [phone] Dept Fax: [phone]   Orders Placed This Encounter  Procedures   CBC with Differential (Cancer Center Only)    Standing Status:   Future    Expiration Date:   10/28/2024   CMP (Cancer Center only)    Standing Status:   Future    Expiration Date:   10/28/2024   Testosterone    Standing Status:   Future    Expiration Date:   10/28/2024   Prostate-Specific AG, Serum     Standing Status:   Future    Expiration Date:   10/28/2024   Vitamin B12    Standing Status:   Future    Expiration Date:   10/28/2024        INTERVAL HISTORY:  [name] is here today for repeat clinical assessment. He denies fevers or chills. He denies pain.  He denies clinical changes.  He tolerated first dose of sipuleucel-T without any side effects.  Central line is in place.  No signs of infection.  Review of system was negative.  I have reviewed the past medical history, past surgical history, social history and family history with the patient and they are unchanged from previous note.  ALLERGIES:  is allergic to penicillins.  MEDICATIONS:  Current Outpatient Medications  Medication Sig Dispense Refill   ARIPiprazole (ABILIFY) 10 MG tablet Take 10 mg by mouth at bedtime.     Choline Fenofibrate 135 MG capsule Take 135 mg by mouth at bedtime. trilipix     Coenzyme Q10 (CO Q 10) 100 MG CAPS Take 100 mg by mouth every evening.     desipramine (NOPRAMIN) 100 MG tablet Take 100 mg by mouth at bedtime.   0   enalapril (VASOTEC) 10 MG tablet Take 10 mg by mouth every morning.      enzalutamide (XTANDI) 40 MG capsule Take 4 capsules (160 mg total) by mouth daily. Takes 4 capsules daily 120 capsule 11   ferrous sulfate 325 (65 FE) MG tablet Take 325 mg by mouth daily.  0   hydroxychloroquine (PLAQUENIL) 200 MG tablet Take 200 mg by mouth 2 (two) times daily.     Krill Oil 300 MG CAPS Take 300 mg by mouth daily.     meloxicam (MOBIC) 7.5 MG tablet Take 7.5 mg by mouth 2 (two) times daily.     Multiple Vitamin (MULTIVITAMIN WITH MINERALS) TABS tablet Take 1 tablet by mouth daily.     pantoprazole (PROTONIX) 40 MG tablet Take 1 tablet (40 mg total) by mouth daily. 30 minutes before breakfast 90 tablet 3   prochlorperazine (COMPAZINE) 10 MG tablet TAKE 1 TABLET BY MOUTH EVERY 6 HOURS AS NEEDED FOR NAUSEA (TAKE 30 MINUTES BEFORE radiation) 30 tablet 2   rosuvastatin (CRESTOR) 40 MG tablet TAKE 1  TABLET DAILY 90 tablet 3   sucralfate (CARAFATE) 1 g tablet Take 1 tablet (1 g total) by mouth 4 (four) times daily -  with meals and at bedtime. 40 tablet 1   venlafaxine XR (EFFEXOR-XR) 75 MG 24 hr capsule Take 225 mg by mouth daily.  0   No current facility-administered medications for this visit.    HISTORY OF PRESENT ILLNESS:   Oncology History  Prostate cancer metastatic to bone [name])  2000 Initial Diagnosis   Initial diagnosis in [place], [place]   11/2005 Surgery   Robotic radical prostatectomy by Dr. Gaynelle [name]. pT3a N0.  Positive margin Gleason 3+4 = 7 adenocarcinoma   07/2008 Progression   PSA 0.11. salvage radiation.  Completed in May 2010. (Dr. Kathrynn [name])   2014 Progression   Found in the appendiceal mass undergoing laparoscopy by general surgery.  Pathology showed solitary intraperitoneal metastases from prostate cancer   07/2013 - 04/2017 Chemotherapy   Started intermittent ADT   02/05/2018 Tumor Marker   PSA 3.77   05/19/2018 Tumor Marker   PSA 5.26   08/23/2018 Tumor Marker   PSA 6.22   08/2018 Progression   PSA increased to 6.22 eventually. CT showed multiple sclerotic bone lesions in the spine, sacrum, pelvis and bone scan with uptake in the right ischium and multiple ribs.   08/2018 -  Chemotherapy   He started enzalutamide. PSA reached nadir <0.2.   03/15/2020 Imaging   Bone scan 1. Resolution of focal radiotracer activity in the right anterior fourth rib and left sacral ala since prior study. 2. Persistent asymmetric activity involving the distal clavicle right greater than left, nonspecific. While metastatic disease cannot be excluded, degenerative changes could also give this pattern. 3. Degenerative type activity of the bilateral glenohumeral joints, cervical spine, and lumbar spine   04/05/2021 Tumor Marker   PSA 0.11   07/17/2021 Tumor Marker   PSA 0.19 Testosterone 13.3   2023 Progression   Rising PSA   09/20/2021 Tumor Marker   PSA  0.26 Testosterone 20.9   12/16/2021 Tumor Marker   PSA 0.57 Testosterone 25.3   03/26/2022 Tumor Marker   PSA 1.17 Testosterone 14.1   06/2022 Imaging   PSMA PET: Positive abdominal lymph nodes mildly improved compared to prior conventional imaging 2020.   06/25/2022 Tumor Marker   PSA 1.36 Testosterone 13.9   10/01/2022 Tumor Marker   PSA 3.7 Testosterone 16.1   01/05/2023 Tumor Marker   PSA 4.2 Testosterone 17.6   03/2023 Tumor Marker   PSA 7.18   04/29/2023 Cancer Staging   Staging form: Prostate, AJCC 8th Edition - Clinical: Stage IVB (cT3a, cN0, cM1b, Grade Group: 2) - Signed by Michael Sartorius, MD on 04/29/2023 Gleason score: 7 Histologic grading system:  5 grade system    Genetic Testing   Ambry testing. Negative for HRR mutation Tempus Liquid: Negative for HRR mutation   05/12/2023 Imaging   PSMA PET A high left posterior mediastinal node measures 7 mm and a S.U.V. max of 3.6 on 54/4. This measured 5 mm and was not tracer avid previously. A subcarinal node measures 8 mm and a S.U.V. max of 3.4 on 83/3. Compare 7 mm and not tracer avid on the prior. Lymph nodes: Porta hepatis nodes of up to 1.7 cm and a S.U.V. max of 36.6 on 114/4. Compare 9 mm and a S.U.V. max of 21.4 on the prior. Left periaortic node measures 1.3 cm and a S.U.V. max of 35.3 on 134/4 versus 6 mm and a S.U.V. max of 4.5 on the prior. New tracer avid abdominal retroperitoneal node in the preaortic space at 4 mm and a S.U.V. max of 6.7 on 143/4.   Liver: No evidence of liver metastasis.  No focal activity to suggest skeletal metastasis. Lumbar spine fixation. Cervical spine fixation.   06/22/2023 - 07/03/2023 Radiation Therapy   ultrahypofractionated radiotherapy (UHRT) mediastinal and intraabdominal LNs   07/29/2023 -  Chemotherapy   Eligard 22.5 mg  PSA 15.8   09/07/2023 PET scan   PSMA PET 1. Stable intensely radiotracer avid retroperitoneal and periportal lymph nodes consistent with prostate  carcinoma nodal metastasis. No interval change. 2. No evidence of local recurrence in the prostatectomy bed. 3. No evidence of visceral metastasis. 4. Single new small focus of radiotracer activity in the RIGHT acetabulum is concerning oligometastatic skeletal metastasis.   10/22/2023 -  Chemotherapy   Patient is on Treatment Plan : PROSTATE Sipuleucel-T q14d         REVIEW OF SYSTEMS:   All relevant systems were reviewed with the patient and are negative.   VITALS:  Blood pressure (!) 128/97, pulse 83, temperature (!) 97.5 F (36.4 C), temperature source Temporal, resp. rate 18, weight 259 lb 4.8 oz (117.6 kg), SpO2 100%.  Wt Readings from Last 3 Encounters:  10/29/23 259 lb 4.8 oz (117.6 kg)  10/22/23 255 lb 8 oz (115.9 kg)  10/16/23 253 lb 8.5 oz (115 kg)    Body mass index is 33.29 kg/m.  Performance status (ECOG): 2 - Symptomatic, <50% confined to bed  PHYSICAL EXAM:   GENERAL: alert, no distress and comfortable SKIN: skin color normal, no rashes and no erythema around the central line LUNGS: clear to auscultation with normal breathing effort.  No wheeze or rales HEART: regular rate & rhythm and no murmurs ABDOMEN: abdomen soft, non-tender and nondistended  LABORATORY DATA:  I have reviewed the data as listed    Component Value Date/Time   NA 138 10/08/2023 1000   NA 141 06/08/2019 0823   K 4.0 10/08/2023 1000   CL 106 10/08/2023 1000   CO2 24 10/08/2023 1000   GLUCOSE 127 (H) 10/08/2023 1000   BUN 20 10/08/2023 1000   BUN 22 06/08/2019 0823   CREATININE 0.70 10/08/2023 1000   CREATININE 0.76 08/31/2023 1209   CALCIUM 9.3 10/08/2023 1000   PROT 7.5 08/31/2023 1209   PROT 7.3 06/08/2019 0823   ALBUMIN 4.5 08/31/2023 1209   ALBUMIN 4.6 06/08/2019 0823   AST 17 08/31/2023 1209   ALT 13 08/31/2023 1209   ALKPHOS 46 08/31/2023 1209   BILITOT 0.4 08/31/2023 1209   GFRNONAA >60 10/08/2023 1000   GFRNONAA >60 08/31/2023 1209   GFRAA 97 06/08/2019 0823  No results found for: "SPEP", "UPEP"  Lab Results  Component Value Date   WBC 4.0 10/29/2023   NEUTROABS 2.6 10/29/2023   HGB 9.0 (L) 10/29/2023   HCT 27.4 (L) 10/29/2023   MCV 104.6 (H) 10/29/2023   PLT 210 10/29/2023      Chemistry      Component Value Date/Time   NA 138 10/08/2023 1000   NA 141 06/08/2019 0823   K 4.0 10/08/2023 1000   CL 106 10/08/2023 1000   CO2 24 10/08/2023 1000   BUN 20 10/08/2023 1000   BUN 22 06/08/2019 0823   CREATININE 0.70 10/08/2023 1000   CREATININE 0.76 08/31/2023 1209      Component Value Date/Time   CALCIUM 9.3 10/08/2023 1000   ALKPHOS 46 08/31/2023 1209   AST 17 08/31/2023 1209   ALT 13 08/31/2023 1209   BILITOT 0.4 08/31/2023 1209       RADIOGRAPHIC STUDIES: I have personally reviewed the radiological images as listed and agreed with the findings in the report. DG Chest Port 1 View Result Date: 10/16/2023 CLINICAL DATA:  Shortness of breath EXAM: PORTABLE CHEST 1 VIEW COMPARISON:  Chest x-ray 01/24/2014 FINDINGS: Right-sided central venous catheter tip projects over the distal SVC. The heart size and mediastinal contours are within normal limits. Both lungs are clear. The visualized skeletal structures are unremarkable. IMPRESSION: No active disease. Electronically Signed   By: Darliss Cheney M.D.   On: 10/16/2023 20:24   IR Fluoro Guide CV Line Right Result Date: 10/08/2023 INDICATION: 68 year old male referred for catheter placement for plasmapheresis EXAM: TUNNELED CENTRAL VENOUS HEMODIALYSIS CATHETER PLACEMENT WITH ULTRASOUND AND FLUOROSCOPIC GUIDANCE MEDICATIONS: 2 g Ancef. The antibiotic was given in an appropriate time interval prior to skin puncture. ANESTHESIA/SEDATION: Moderate (conscious) sedation was employed during this procedure. A total of Versed 1.5 mg and Fentanyl 75 mcg was administered intravenously by the radiology nurse. Total intra-service moderate Sedation Time: 5 11 minutes. The patient's level of  consciousness and vital signs were monitored continuously by radiology nursing throughout the procedure under my direct supervision. FLUOROSCOPY: Radiation Exposure Index (as provided by the fluoroscopic device): 1 mGy Kerma COMPLICATIONS: None PROCEDURE: Informed written consent was obtained from the patient after a discussion of the risks, benefits, and alternatives to treatment. Questions regarding the procedure were encouraged and answered. The right neck and chest were prepped with chlorhexidine in a sterile fashion, and a sterile drape was applied covering the operative field. Maximum barrier sterile technique with sterile gowns and gloves were used for the procedure. A timeout was performed prior to the initiation of the procedure. Ultrasound survey was performed. The right internal jugular vein was confirmed to be patent, with images stored and sent to PACS. Micropuncture kit was utilized to access the right internal jugular vein under direct, real-time ultrasound guidance after the overlying soft tissues were anesthetized with 1% lidocaine with epinephrine. Stab incision was made with 11 blade scalpel. Microwire was passed centrally. The microwire was then marked to measure appropriate internal catheter length. External tunneled length was estimated. A total tip to cuff length of 19 cm was selected. 035 guidewire was advanced to the level of the IVC. Skin and subcutaneous tissues of chest wall below the clavicle were generously infiltrated with 1% lidocaine for local anesthesia. A small stab incision was made with 11 blade scalpel. The selected hemodialysis catheter was tunneled in a retrograde fashion from the anterior chest wall to the venotomy incision. Serial dilation was performed and then a peel-away  sheath was placed. The catheter was then placed through the peel-away sheath with tips ultimately positioned within the superior aspect of the right atrium. Final catheter positioning was confirmed and  documented with a spot radiographic image. The catheter aspirates and flushes normally. The catheter was flushed with appropriate volume heparin dwells. The catheter exit site was secured with a 0-Prolene retention suture. Gel-Foam slurry was infused into the soft tissue tract. The venotomy incision was closed Derma bond and sterile dressing. Dressings were applied at the chest wall. Patient tolerated the procedure well and remained hemodynamically stable throughout. No complications were encountered and no significant blood loss encountered. IMPRESSION: Status post right IJ tunneled hemodialysis catheter for plasmapheresis purpose. Signed, Yvone Neu. Miachel Roux, RPVI [facility] Electronically Signed   By: Gilmer Mor D.O.   On: 10/08/2023 13:40   IR US Guide Vasc Access Right Result Date: 10/08/2023 INDICATION: 68 year old male referred for catheter placement for plasmapheresis EXAM: TUNNELED CENTRAL VENOUS HEMODIALYSIS CATHETER PLACEMENT WITH ULTRASOUND AND FLUOROSCOPIC GUIDANCE MEDICATIONS: 2 g Ancef. The antibiotic was given in an appropriate time interval prior to skin puncture. ANESTHESIA/SEDATION: Moderate (conscious) sedation was employed during this procedure. A total of Versed 1.5 mg and Fentanyl 75 mcg was administered intravenously by the radiology nurse. Total intra-service moderate Sedation Time: 5 11 minutes. The patient's level of consciousness and vital signs were monitored continuously by radiology nursing throughout the procedure under my direct supervision. FLUOROSCOPY: Radiation Exposure Index (as provided by the fluoroscopic device): 1 mGy Kerma COMPLICATIONS: None PROCEDURE: Informed written consent was obtained from the patient after a discussion of the risks, benefits, and alternatives to treatment. Questions regarding the procedure were encouraged and answered. The right neck and chest were prepped with chlorhexidine in a  sterile fashion, and a sterile drape was applied covering the operative field. Maximum barrier sterile technique with sterile gowns and gloves were used for the procedure. A timeout was performed prior to the initiation of the procedure. Ultrasound survey was performed. The right internal jugular vein was confirmed to be patent, with images stored and sent to PACS. Micropuncture kit was utilized to access the right internal jugular vein under direct, real-time ultrasound guidance after the overlying soft tissues were anesthetized with 1% lidocaine with epinephrine. Stab incision was made with 11 blade scalpel. Microwire was passed centrally. The microwire was then marked to measure appropriate internal catheter length. External tunneled length was estimated. A total tip to cuff length of 19 cm was selected. 035 guidewire was advanced to the level of the IVC. Skin and subcutaneous tissues of chest wall below the clavicle were generously infiltrated with 1% lidocaine for local anesthesia. A small stab incision was made with 11 blade scalpel. The selected hemodialysis catheter was tunneled in a retrograde fashion from the anterior chest wall to the venotomy incision. Serial dilation was performed and then a peel-away sheath was placed. The catheter was then placed through the peel-away sheath with tips ultimately positioned within the superior aspect of the right atrium. Final catheter positioning was confirmed and documented with a spot radiographic image. The catheter aspirates and flushes normally. The catheter was flushed with appropriate volume heparin dwells. The catheter exit site was secured with a 0-Prolene retention suture. Gel-Foam slurry was infused into the soft tissue tract. The venotomy incision was closed Derma bond and sterile dressing. Dressings were applied at the chest wall. Patient tolerated the procedure well and remained hemodynamically stable throughout. No complications were encountered  and no  significant blood loss encountered. IMPRESSION: Status post right IJ tunneled hemodialysis catheter for plasmapheresis purpose. Signed, Yvone Neu. Miachel Roux, RPVI [facility] Inter[facility] Radiology Special[facility] Radiology Electronically Signed   By: Gilmer Mor D.O.   On: 10/08/2023 13:40

## 2023-10-28 NOTE — Assessment & Plan Note (Signed)
 Continue ADT with enzalutamide  Sipuleucel-T Repeat lab next Month, ordered.

## 2023-10-29 ENCOUNTER — Other Ambulatory Visit: Payer: Self-pay

## 2023-10-29 ENCOUNTER — Inpatient Hospital Stay (HOSPITAL_BASED_OUTPATIENT_CLINIC_OR_DEPARTMENT_OTHER)

## 2023-10-29 ENCOUNTER — Ambulatory Visit: Payer: Medicare Other

## 2023-10-29 ENCOUNTER — Inpatient Hospital Stay: Payer: Medicare Other

## 2023-10-29 ENCOUNTER — Other Ambulatory Visit

## 2023-10-29 ENCOUNTER — Inpatient Hospital Stay

## 2023-10-29 VITALS — BP 116/65 | HR 78 | Temp 98.0°F | Resp 16

## 2023-10-29 VITALS — BP 128/97 | HR 83 | Temp 97.5°F | Resp 18 | Wt 259.3 lb

## 2023-10-29 DIAGNOSIS — Z95828 Presence of other vascular implants and grafts: Secondary | ICD-10-CM | POA: Insufficient documentation

## 2023-10-29 DIAGNOSIS — I10 Essential (primary) hypertension: Secondary | ICD-10-CM | POA: Diagnosis not present

## 2023-10-29 DIAGNOSIS — C7951 Secondary malignant neoplasm of bone: Secondary | ICD-10-CM | POA: Insufficient documentation

## 2023-10-29 DIAGNOSIS — J449 Chronic obstructive pulmonary disease, unspecified: Secondary | ICD-10-CM | POA: Diagnosis not present

## 2023-10-29 DIAGNOSIS — Z791 Long term (current) use of non-steroidal anti-inflammatories (NSAID): Secondary | ICD-10-CM | POA: Diagnosis not present

## 2023-10-29 DIAGNOSIS — C61 Malignant neoplasm of prostate: Secondary | ICD-10-CM | POA: Diagnosis not present

## 2023-10-29 DIAGNOSIS — Z5111 Encounter for antineoplastic chemotherapy: Secondary | ICD-10-CM | POA: Diagnosis present

## 2023-10-29 DIAGNOSIS — E538 Deficiency of other specified B group vitamins: Secondary | ICD-10-CM | POA: Diagnosis not present

## 2023-10-29 DIAGNOSIS — Z923 Personal history of irradiation: Secondary | ICD-10-CM | POA: Insufficient documentation

## 2023-10-29 DIAGNOSIS — Z79899 Other long term (current) drug therapy: Secondary | ICD-10-CM | POA: Insufficient documentation

## 2023-10-29 DIAGNOSIS — E785 Hyperlipidemia, unspecified: Secondary | ICD-10-CM | POA: Diagnosis not present

## 2023-10-29 LAB — CBC WITH DIFFERENTIAL (CANCER CENTER ONLY)
Abs Immature Granulocytes: 0.04 10*3/uL (ref 0.00–0.07)
Basophils Absolute: 0 10*3/uL (ref 0.0–0.1)
Basophils Relative: 1 %
Eosinophils Absolute: 0.4 10*3/uL (ref 0.0–0.5)
Eosinophils Relative: 9 %
HCT: 27.4 % — ABNORMAL LOW (ref 39.0–52.0)
Hemoglobin: 9 g/dL — ABNORMAL LOW (ref 13.0–17.0)
Immature Granulocytes: 1 %
Lymphocytes Relative: 11 %
Lymphs Abs: 0.4 10*3/uL — ABNORMAL LOW (ref 0.7–4.0)
MCH: 34.4 pg — ABNORMAL HIGH (ref 26.0–34.0)
MCHC: 32.8 g/dL (ref 30.0–36.0)
MCV: 104.6 fL — ABNORMAL HIGH (ref 80.0–100.0)
Monocytes Absolute: 0.6 10*3/uL (ref 0.1–1.0)
Monocytes Relative: 14 %
Neutro Abs: 2.6 10*3/uL (ref 1.7–7.7)
Neutrophils Relative %: 64 %
Platelet Count: 210 10*3/uL (ref 150–400)
RBC: 2.62 MIL/uL — ABNORMAL LOW (ref 4.22–5.81)
RDW: 12.6 % (ref 11.5–15.5)
WBC Count: 4 10*3/uL (ref 4.0–10.5)
nRBC: 0 % (ref 0.0–0.2)

## 2023-10-29 MED ORDER — HEPARIN SODIUM (PORCINE) 1000 UNIT/ML DIALYSIS
1600.0000 [IU] | Freq: Once | INTRAMUSCULAR | Status: AC
  Administered 2023-10-29: 1600 [IU]
  Filled 2023-10-29: qty 1.6

## 2023-10-29 MED ORDER — ACETAMINOPHEN 325 MG PO TABS
650.0000 mg | ORAL_TABLET | Freq: Once | ORAL | Status: AC
  Administered 2023-10-29: 650 mg via ORAL
  Filled 2023-10-29: qty 2

## 2023-10-29 MED ORDER — SODIUM CHLORIDE 0.9% FLUSH
10.0000 mL | INTRAVENOUS | Status: DC | PRN
  Administered 2023-10-29: 10 mL via INTRAVENOUS

## 2023-10-29 MED ORDER — SODIUM CHLORIDE 0.9% FLUSH
10.0000 mL | Freq: Once | INTRAVENOUS | Status: AC
  Administered 2023-10-29: 10 mL via INTRAVENOUS

## 2023-10-29 MED ORDER — SODIUM CHLORIDE 0.9 % IV SOLN: INTRAVENOUS | Status: DC

## 2023-10-29 MED ORDER — DIPHENHYDRAMINE HCL 25 MG PO CAPS
25.0000 mg | ORAL_CAPSULE | Freq: Once | ORAL | Status: AC
  Administered 2023-10-29: 25 mg via ORAL
  Filled 2023-10-29: qty 1

## 2023-10-29 MED ORDER — SIPULEUCEL-T IV INFUSION
250.0000 mL | Freq: Once | INTRAVENOUS | Status: AC
  Administered 2023-10-29: 250 mL via INTRAVENOUS
  Filled 2023-10-29: qty 250

## 2023-10-29 MED ORDER — LEUPROLIDE ACETATE (3 MONTH) 22.5 MG IM KIT
22.5000 mg | PACK | Freq: Once | INTRAMUSCULAR | Status: AC
  Administered 2023-10-29: 22.5 mg via INTRAMUSCULAR
  Filled 2023-10-29: qty 22.5

## 2023-10-29 NOTE — Assessment & Plan Note (Signed)
 Continue B12 500 mcg daily  Repeat next month

## 2023-10-29 NOTE — Progress Notes (Signed)
 Pt observed for 30 minutes post Provenge infusion. Pt tolerated Tx well w/out incident. VSS at discharge.  Ambulatory w/ walker to lobby.

## 2023-10-29 NOTE — Progress Notes (Signed)
 Last Provenge tx on 10/22/23.  OK to give 2nd tx today per Dr Verl Bangs

## 2023-10-29 NOTE — Patient Instructions (Signed)
 [facility]  Discharge Instructions: Thank you for choosing [facility] to provide your oncology and hematology care.   If you have a lab appointment with the Cancer Center, please go directly to the Cancer Center and check in at the registration area.   Wear comfortable clothing and clothing appropriate for easy access to any Portacath or PICC line.   We strive to give you quality time with your provider. You may need to reschedule your appointment if you arrive late (15 or more minutes).  Arriving late affects you and other patients whose appointments are after yours.  Also, if you miss three or more appointments without notifying the office, you may be dismissed from the clinic at the provider's discretion.      For prescription refill requests, have your pharmacy contact our office and allow 72 hours for refills to be completed.    Today you received the following chemotherapy and/or immunotherapy agents: Provenge      To help prevent nausea and vomiting after your treatment, we encourage you to take your nausea medication as directed.  BELOW ARE SYMPTOMS THAT SHOULD BE REPORTED IMMEDIATELY: *FEVER GREATER THAN 100.4 F (38 C) OR HIGHER *CHILLS OR SWEATING *NAUSEA AND VOMITING THAT IS NOT CONTROLLED WITH YOUR NAUSEA MEDICATION *UNUSUAL SHORTNESS OF BREATH *UNUSUAL BRUISING OR BLEEDING *URINARY PROBLEMS (pain or burning when urinating, or frequent urination) *BOWEL PROBLEMS (unusual diarrhea, constipation, pain near the anus) TENDERNESS IN MOUTH AND THROAT WITH OR WITHOUT PRESENCE OF ULCERS (sore throat, sores in mouth, or a toothache) UNUSUAL RASH, SWELLING OR PAIN  UNUSUAL VAGINAL DISCHARGE OR ITCHING   Items with * indicate a potential emergency and should be followed up as soon as possible or go to the Emergency Department if any problems should occur.  Please show the CHEMOTHERAPY ALERT CARD or IMMUNOTHERAPY  ALERT CARD at check-in to the Emergency Department and triage nurse.  Should you have questions after your visit or need to cancel or reschedule your appointment, please contact [facility] - A DEPT OF [facility]  Dept: [phone]  and follow the prompts.  Office hours are 8:00 a.m. to 4:30 p.m. Monday - Friday. Please note that voicemails left after 4:00 p.m. may not be returned until the following business day.  We are closed weekends and major holidays. You have access to a nurse at all times for urgent questions. Please call the main number to the clinic Dept: [phone] and follow the prompts.   For any non-urgent questions, you may also contact your provider using MyChart. We now offer e-Visits for anyone 2 and older to request care online for non-urgent symptoms. For details visit [email or web address].   Also download the MyChart app! Go to the app store, search "MyChart", open the app, select , and log in with your MyChart username and password.

## 2023-10-30 ENCOUNTER — Other Ambulatory Visit: Payer: Self-pay

## 2023-11-02 NOTE — Progress Notes (Unsigned)
 Patient presented to the [facility] for cell collection for his on going treatment of Provenge.   Staff at [facility] were unable to establish blood return from patient's hemodialysis catheter.    RN spoke with our clinical nurse educator and we will bring patient in for assessment on Wednesday 3/12 at 9:30am.   RN has reached out to [facility] to coordinate for cell collection on 3/17.  We will bring patient in prior to cell collection with assessment of cath at [facility].   Patient aware, will work on coordinating new dates and confirm with patient.

## 2023-11-04 ENCOUNTER — Inpatient Hospital Stay

## 2023-11-04 DIAGNOSIS — C61 Malignant neoplasm of prostate: Secondary | ICD-10-CM | POA: Diagnosis not present

## 2023-11-04 DIAGNOSIS — Z95828 Presence of other vascular implants and grafts: Secondary | ICD-10-CM

## 2023-11-04 DIAGNOSIS — C7951 Secondary malignant neoplasm of bone: Secondary | ICD-10-CM

## 2023-11-04 DIAGNOSIS — E538 Deficiency of other specified B group vitamins: Secondary | ICD-10-CM

## 2023-11-04 DIAGNOSIS — Z5111 Encounter for antineoplastic chemotherapy: Secondary | ICD-10-CM | POA: Diagnosis not present

## 2023-11-04 LAB — CBC WITH DIFFERENTIAL (CANCER CENTER ONLY)
Abs Immature Granulocytes: 0.06 10*3/uL (ref 0.00–0.07)
Basophils Absolute: 0 10*3/uL (ref 0.0–0.1)
Basophils Relative: 1 %
Eosinophils Absolute: 0.4 10*3/uL (ref 0.0–0.5)
Eosinophils Relative: 8 %
HCT: 26.2 % — ABNORMAL LOW (ref 39.0–52.0)
Hemoglobin: 8.9 g/dL — ABNORMAL LOW (ref 13.0–17.0)
Immature Granulocytes: 1 %
Lymphocytes Relative: 9 %
Lymphs Abs: 0.5 10*3/uL — ABNORMAL LOW (ref 0.7–4.0)
MCH: 34.2 pg — ABNORMAL HIGH (ref 26.0–34.0)
MCHC: 34 g/dL (ref 30.0–36.0)
MCV: 100.8 fL — ABNORMAL HIGH (ref 80.0–100.0)
Monocytes Absolute: 0.7 10*3/uL (ref 0.1–1.0)
Monocytes Relative: 12 %
Neutro Abs: 3.8 10*3/uL (ref 1.7–7.7)
Neutrophils Relative %: 69 %
Platelet Count: 228 10*3/uL (ref 150–400)
RBC: 2.6 MIL/uL — ABNORMAL LOW (ref 4.22–5.81)
RDW: 12.6 % (ref 11.5–15.5)
WBC Count: 5.4 10*3/uL (ref 4.0–10.5)
nRBC: 0 % (ref 0.0–0.2)

## 2023-11-04 LAB — CMP (CANCER CENTER ONLY)
ALT: 10 U/L (ref 0–44)
AST: 16 U/L (ref 15–41)
Albumin: 4 g/dL (ref 3.5–5.0)
Alkaline Phosphatase: 48 U/L (ref 38–126)
Anion gap: 6 (ref 5–15)
BUN: 17 mg/dL (ref 8–23)
CO2: 26 mmol/L (ref 22–32)
Calcium: 9.4 mg/dL (ref 8.9–10.3)
Chloride: 105 mmol/L (ref 98–111)
Creatinine: 0.67 mg/dL (ref 0.61–1.24)
GFR, Estimated: >60 mL/min (ref >60)
Glucose, Bld: 121 mg/dL — ABNORMAL HIGH (ref 70–99)
Potassium: 3.8 mmol/L (ref 3.5–5.1)
Sodium: 137 mmol/L (ref 135–145)
Total Bilirubin: 0.2 mg/dL (ref 0.0–1.2)
Total Protein: 6.6 g/dL (ref 6.5–8.1)

## 2023-11-04 LAB — VITAMIN B12: Vitamin B-12: 513 pg/mL (ref 180–914)

## 2023-11-04 MED ORDER — HEPARIN SODIUM (PORCINE) 1000 UNIT/ML IJ SOLN
1600.0000 [IU] | Freq: Once | INTRAMUSCULAR | Status: AC
  Administered 2023-11-04: 1600 [IU]
  Filled 2023-11-04: qty 10

## 2023-11-04 MED ORDER — SODIUM CHLORIDE 0.9% FLUSH
10.0000 mL | INTRAVENOUS | Status: DC | PRN
Start: 2023-11-04 — End: 2023-11-04
  Administered 2023-11-04: 10 mL via INTRAVENOUS

## 2023-11-04 MED ORDER — HEPARIN SODIUM (PORCINE) 1000 UNIT/ML IJ SOLN
1600.0000 [IU] | Freq: Once | INTRAMUSCULAR | Status: AC
Start: 2023-11-04 — End: 2023-11-04
  Administered 2023-11-04: 1600 [IU]
  Filled 2023-11-04: qty 10

## 2023-11-04 NOTE — Progress Notes (Signed)
 Patient here for assessment of apheresis catheter after [employer] [facility] had difficulty obtaining blood return during apheresis treatment. Extremely sluggish blood return noted from both lumens. Unable to withdraw heparin locking solution. Both lumens then flushed with 10 mL normal saline and then very brisk blood return obtained from both lumens. Cathflo not indicated. Both lumens flushed with additional saline using pulsatile flushing technique. Heparin 1600 units locking solution then instilled into both lumens. Patient will return Monday morning for catheter assessment prior to going to [facility]. Patient stable for discharge.

## 2023-11-05 ENCOUNTER — Other Ambulatory Visit: Payer: Self-pay | Admitting: *Deleted

## 2023-11-05 ENCOUNTER — Inpatient Hospital Stay

## 2023-11-05 ENCOUNTER — Other Ambulatory Visit: Payer: Self-pay

## 2023-11-05 ENCOUNTER — Inpatient Hospital Stay: Payer: Medicare Other

## 2023-11-05 ENCOUNTER — Ambulatory Visit: Payer: Medicare Other

## 2023-11-05 ENCOUNTER — Ambulatory Visit

## 2023-11-05 DIAGNOSIS — C61 Malignant neoplasm of prostate: Secondary | ICD-10-CM

## 2023-11-05 DIAGNOSIS — C7951 Secondary malignant neoplasm of bone: Secondary | ICD-10-CM

## 2023-11-05 LAB — PROSTATE-SPECIFIC AG, SERUM (LABCORP): Prostate Specific Ag, Serum: 5.3 ng/mL — ABNORMAL HIGH (ref 0.0–4.0)

## 2023-11-05 LAB — TESTOSTERONE: Testosterone: 6 ng/dL — ABNORMAL LOW (ref 264–916)

## 2023-11-09 ENCOUNTER — Inpatient Hospital Stay

## 2023-11-09 VITALS — BP 116/68 | HR 89 | Temp 98.7°F | Resp 18

## 2023-11-09 DIAGNOSIS — C61 Malignant neoplasm of prostate: Secondary | ICD-10-CM

## 2023-11-09 DIAGNOSIS — Z95828 Presence of other vascular implants and grafts: Secondary | ICD-10-CM

## 2023-11-09 DIAGNOSIS — C7951 Secondary malignant neoplasm of bone: Secondary | ICD-10-CM | POA: Diagnosis not present

## 2023-11-09 DIAGNOSIS — Z5111 Encounter for antineoplastic chemotherapy: Secondary | ICD-10-CM | POA: Diagnosis not present

## 2023-11-09 LAB — CBC WITH DIFFERENTIAL (CANCER CENTER ONLY)
Abs Immature Granulocytes: 0.03 10*3/uL (ref 0.00–0.07)
Basophils Absolute: 0 10*3/uL (ref 0.0–0.1)
Basophils Relative: 1 %
Eosinophils Absolute: 0.5 10*3/uL (ref 0.0–0.5)
Eosinophils Relative: 8 %
HCT: 29 % — ABNORMAL LOW (ref 39.0–52.0)
Hemoglobin: 9.6 g/dL — ABNORMAL LOW (ref 13.0–17.0)
Immature Granulocytes: 1 %
Lymphocytes Relative: 10 %
Lymphs Abs: 0.6 10*3/uL — ABNORMAL LOW (ref 0.7–4.0)
MCH: 34 pg (ref 26.0–34.0)
MCHC: 33.1 g/dL (ref 30.0–36.0)
MCV: 102.8 fL — ABNORMAL HIGH (ref 80.0–100.0)
Monocytes Absolute: 0.7 10*3/uL (ref 0.1–1.0)
Monocytes Relative: 12 %
Neutro Abs: 4.1 10*3/uL (ref 1.7–7.7)
Neutrophils Relative %: 68 %
Platelet Count: 263 10*3/uL (ref 150–400)
RBC: 2.82 MIL/uL — ABNORMAL LOW (ref 4.22–5.81)
RDW: 12.6 % (ref 11.5–15.5)
WBC Count: 6 10*3/uL (ref 4.0–10.5)
nRBC: 0 % (ref 0.0–0.2)

## 2023-11-09 LAB — CMP (CANCER CENTER ONLY)
ALT: 11 U/L (ref 0–44)
AST: 17 U/L (ref 15–41)
Albumin: 4.3 g/dL (ref 3.5–5.0)
Alkaline Phosphatase: 55 U/L (ref 38–126)
Anion gap: 7 (ref 5–15)
BUN: 18 mg/dL (ref 8–23)
CO2: 26 mmol/L (ref 22–32)
Calcium: 9.4 mg/dL (ref 8.9–10.3)
Chloride: 103 mmol/L (ref 98–111)
Creatinine: 0.68 mg/dL (ref 0.61–1.24)
GFR, Estimated: >60 mL/min (ref >60)
Glucose, Bld: 120 mg/dL — ABNORMAL HIGH (ref 70–99)
Potassium: 3.9 mmol/L (ref 3.5–5.1)
Sodium: 136 mmol/L (ref 135–145)
Total Bilirubin: 0.2 mg/dL (ref 0.0–1.2)
Total Protein: 7.3 g/dL (ref 6.5–8.1)

## 2023-11-09 MED ORDER — HEPARIN SODIUM (PORCINE) 1000 UNIT/ML IJ SOLN
1.6000 mL | Freq: Once | INTRAMUSCULAR | Status: AC
Start: 2023-11-09 — End: 2023-11-09
  Administered 2023-11-09: 1600 [IU] via INTRAVENOUS

## 2023-11-09 MED ORDER — SODIUM CHLORIDE 0.9% FLUSH
10.0000 mL | INTRAVENOUS | Status: DC | PRN
  Administered 2023-11-09: 10 mL via INTRAVENOUS

## 2023-11-09 MED ORDER — HEPARIN SOD (PORK) LOCK FLUSH 100 UNIT/ML IV SOLN: 500.0000 [IU] | Freq: Once | INTRAVENOUS | Status: DC

## 2023-11-10 ENCOUNTER — Ambulatory Visit: Payer: Medicare Other

## 2023-11-10 ENCOUNTER — Other Ambulatory Visit (HOSPITAL_COMMUNITY)

## 2023-11-10 LAB — PROSTATE-SPECIFIC AG, SERUM (LABCORP): Prostate Specific Ag, Serum: 5.9 ng/mL — ABNORMAL HIGH (ref 0.0–4.0)

## 2023-11-10 LAB — TESTOSTERONE: Testosterone: 5 ng/dL — ABNORMAL LOW (ref 264–916)

## 2023-11-12 ENCOUNTER — Other Ambulatory Visit: Payer: Self-pay

## 2023-11-12 ENCOUNTER — Other Ambulatory Visit: Payer: Self-pay | Admitting: Radiology

## 2023-11-12 ENCOUNTER — Inpatient Hospital Stay

## 2023-11-12 VITALS — BP 107/65 | HR 86 | Temp 98.5°F | Resp 16 | Wt 252.5 lb

## 2023-11-12 DIAGNOSIS — C7951 Secondary malignant neoplasm of bone: Secondary | ICD-10-CM | POA: Diagnosis not present

## 2023-11-12 DIAGNOSIS — C61 Malignant neoplasm of prostate: Secondary | ICD-10-CM

## 2023-11-12 DIAGNOSIS — Z5111 Encounter for antineoplastic chemotherapy: Secondary | ICD-10-CM | POA: Diagnosis not present

## 2023-11-12 MED ORDER — HEPARIN SODIUM (PORCINE) 1000 UNIT/ML IJ SOLN
1600.0000 [IU] | Freq: Once | INTRAMUSCULAR | Status: DC
  Filled 2023-11-12: qty 1.6

## 2023-11-12 MED ORDER — SODIUM CHLORIDE 0.9% FLUSH
10.0000 mL | Freq: Once | INTRAVENOUS | Status: AC
  Administered 2023-11-12: 10 mL via INTRAVENOUS

## 2023-11-12 MED ORDER — SODIUM CHLORIDE 0.9 % IV SOLN: INTRAVENOUS | Status: DC

## 2023-11-12 MED ORDER — DIPHENHYDRAMINE HCL 25 MG PO CAPS
25.0000 mg | ORAL_CAPSULE | Freq: Once | ORAL | Status: AC
Start: 2023-11-12 — End: 2023-11-12
  Administered 2023-11-12: 25 mg via ORAL
  Filled 2023-11-12: qty 1

## 2023-11-12 MED ORDER — ACETAMINOPHEN 325 MG PO TABS
650.0000 mg | ORAL_TABLET | Freq: Once | ORAL | Status: AC
  Administered 2023-11-12: 650 mg via ORAL
  Filled 2023-11-12: qty 2

## 2023-11-12 MED ORDER — SIPULEUCEL-T IV INFUSION
250.0000 mL | Freq: Once | INTRAVENOUS | Status: AC
  Administered 2023-11-12: 250 mL via INTRAVENOUS
  Filled 2023-11-12: qty 250

## 2023-11-12 MED ORDER — SODIUM CHLORIDE 0.9% FLUSH
10.0000 mL | INTRAVENOUS | Status: DC | PRN
Start: 2023-11-12 — End: 2023-11-12
  Administered 2023-11-12: 10 mL

## 2023-11-12 NOTE — Patient Instructions (Signed)
 [facility]  Discharge Instructions: Thank you for choosing [facility] to provide your oncology and hematology care.   If you have a lab appointment with the Cancer Center, please go directly to the Cancer Center and check in at the registration area.   Wear comfortable clothing and clothing appropriate for easy access to any Portacath or PICC line.   We strive to give you quality time with your provider. You may need to reschedule your appointment if you arrive late (15 or more minutes).  Arriving late affects you and other patients whose appointments are after yours.  Also, if you miss three or more appointments without notifying the office, you may be dismissed from the clinic at the provider's discretion.      For prescription refill requests, have your pharmacy contact our office and allow 72 hours for refills to be completed.    Today you received the following chemotherapy and/or immunotherapy agents: Provenge      To help prevent nausea and vomiting after your treatment, we encourage you to take your nausea medication as directed.  BELOW ARE SYMPTOMS THAT SHOULD BE REPORTED IMMEDIATELY: *FEVER GREATER THAN 100.4 F (38 C) OR HIGHER *CHILLS OR SWEATING *NAUSEA AND VOMITING THAT IS NOT CONTROLLED WITH YOUR NAUSEA MEDICATION *UNUSUAL SHORTNESS OF BREATH *UNUSUAL BRUISING OR BLEEDING *URINARY PROBLEMS (pain or burning when urinating, or frequent urination) *BOWEL PROBLEMS (unusual diarrhea, constipation, pain near the anus) TENDERNESS IN MOUTH AND THROAT WITH OR WITHOUT PRESENCE OF ULCERS (sore throat, sores in mouth, or a toothache) UNUSUAL RASH, SWELLING OR PAIN  UNUSUAL VAGINAL DISCHARGE OR ITCHING   Items with * indicate a potential emergency and should be followed up as soon as possible or go to the Emergency Department if any problems should occur.  Please show the CHEMOTHERAPY ALERT CARD or IMMUNOTHERAPY  ALERT CARD at check-in to the Emergency Department and triage nurse.  Should you have questions after your visit or need to cancel or reschedule your appointment, please contact [facility] - A DEPT OF [facility]  Dept: [phone]  and follow the prompts.  Office hours are 8:00 a.m. to 4:30 p.m. Monday - Friday. Please note that voicemails left after 4:00 p.m. may not be returned until the following business day.  We are closed weekends and major holidays. You have access to a nurse at all times for urgent questions. Please call the main number to the clinic Dept: [phone] and follow the prompts.   For any non-urgent questions, you may also contact your provider using MyChart. We now offer e-Visits for anyone 2 and older to request care online for non-urgent symptoms. For details visit [email or web address].   Also download the MyChart app! Go to the app store, search "MyChart", open the app, select , and log in with your MyChart username and password.

## 2023-11-12 NOTE — Progress Notes (Signed)
 While assessing Pt's apheresis catheter. This RN noted no blood return from blue line and good blood return from the red line. This RN made Chyrl Civatte RN aware. Chyrl Civatte RN assessed Pt's apheresis catheter. No blood return noted from blue line, per Chyrl Civatte RN OK to proceed with tx using red line.  Per Chyrl Civatte RN and Bernette Redbird RN d/t Pt's apheresis catheter being removed tomorrow, 11/13/23, and no blood return from blue line, HOLD heparin flush today for both lines, instead saline flush and saline lock Pt's line prior to discharge.

## 2023-11-12 NOTE — Progress Notes (Signed)
 Pt observed for 30 minutes post Provenge infusion. Pt tolerated Tx well w/out incident. VSS at discharge.  Ambulatory w/ walker to lobby.

## 2023-11-13 ENCOUNTER — Other Ambulatory Visit: Payer: Self-pay

## 2023-11-13 ENCOUNTER — Ambulatory Visit (HOSPITAL_COMMUNITY)
Admission: RE | Admit: 2023-11-13 | Discharge: 2023-11-13 | Disposition: A | Discharge status: Home / Self Care | Source: Ambulatory Visit

## 2023-11-13 DIAGNOSIS — C7951 Secondary malignant neoplasm of bone: Secondary | ICD-10-CM

## 2023-11-13 DIAGNOSIS — Z452 Encounter for adjustment and management of vascular access device: Secondary | ICD-10-CM | POA: Diagnosis present

## 2023-11-13 DIAGNOSIS — C61 Malignant neoplasm of prostate: Secondary | ICD-10-CM | POA: Diagnosis not present

## 2023-11-13 HISTORY — PX: IR REMOVAL TUN CV CATH W/O FL: IMG2289

## 2023-11-13 MED ORDER — LIDOCAINE-EPINEPHRINE 1 %-1:100000 IJ SOLN
INTRAMUSCULAR | Status: AC
  Filled 2023-11-13: qty 1

## 2023-11-13 MED ORDER — LIDOCAINE-EPINEPHRINE 1 %-1:100000 IJ SOLN
20.0000 mL | Freq: Once | INTRAMUSCULAR | Status: AC
  Administered 2023-11-13: 4 mL via INTRADERMAL

## 2023-11-13 NOTE — Procedures (Signed)
 Interventional Radiology Procedure Note  Procedure: Removal of a right IJ approach tunneled hd cath .  Complications: None  Recommendations:    - Routine wound care   Signed,  Yvone Neu. Loreta Ave, DO

## 2023-11-15 ENCOUNTER — Other Ambulatory Visit: Payer: Self-pay

## 2023-11-16 ENCOUNTER — Inpatient Hospital Stay

## 2023-11-17 ENCOUNTER — Other Ambulatory Visit: Payer: Self-pay

## 2023-11-18 ENCOUNTER — Encounter: Payer: Self-pay | Admitting: Cardiovascular Disease

## 2023-11-18 ENCOUNTER — Ambulatory Visit: Payer: Medicare Other | Attending: Cardiovascular Disease | Admitting: Cardiovascular Disease

## 2023-11-18 ENCOUNTER — Other Ambulatory Visit: Payer: Self-pay

## 2023-11-18 DIAGNOSIS — J4489 Other specified chronic obstructive pulmonary disease: Secondary | ICD-10-CM | POA: Insufficient documentation

## 2023-11-18 DIAGNOSIS — F32A Depression, unspecified: Secondary | ICD-10-CM | POA: Diagnosis present

## 2023-11-18 DIAGNOSIS — E1169 Type 2 diabetes mellitus with other specified complication: Secondary | ICD-10-CM | POA: Insufficient documentation

## 2023-11-18 DIAGNOSIS — E669 Obesity, unspecified: Secondary | ICD-10-CM | POA: Diagnosis present

## 2023-11-18 DIAGNOSIS — I10 Essential (primary) hypertension: Secondary | ICD-10-CM | POA: Insufficient documentation

## 2023-11-18 DIAGNOSIS — G4733 Obstructive sleep apnea (adult) (pediatric): Secondary | ICD-10-CM | POA: Insufficient documentation

## 2023-11-18 NOTE — Progress Notes (Signed)
 Specialty Pharmacy Refill Coordination Note  [name] is a 68 y.o. male contacted today regarding refills of specialty medication(s) Enzalutamide [name])   Patient requested Delivery   Delivery date: 11/25/23   Verified address: [address]  [place] [address]   Medication will be filled on 04.01.25.

## 2023-11-18 NOTE — Progress Notes (Unsigned)
 Cardiology Office Note    Date:  11/19/2023   ID:  [name], DOB 12-08-1955, MRN [id number]  PCP:  [name], MD  Cardiologist:  Nicki Guadalajara, MD (sleep); Dr. Sabino Niemann  34-month follow-up sleep evaluation  History of Present Illness:  [name] is a 68 y.o. male who has a history of hypertension, hyperlipidemia, COPD, type 2 diabetes mellitus, obesity, as well as metastatic prostate CA on anti-antigen therapy.  He has a history of obstructive sleep apnea that dates back to 2012.  He had an old Respironics unit which is an auto unit which is set at 4 -20.  He brought his machine with him to the office today.  He has not been followed by a sleep physician only saw Dr. Park Breed once after initiation of treatment.  In 2012.  Recently, he has not been sleeping well.  He is not getting adequate pressures.  The machine tends to cut off at times.  A download reveals a 30 day sleep average of 8 hours and 30 minutes.  Days greater than 4 hours were 29 out of 30.  AHI was 3.0.  He has noticed breakthrough snoring.  He wakes up at least 3 times per night for nocturia.  He is unaware of restless legs.  He denies bruxism.  An Epworth Sleepiness Scale score was calculated in the office today and this endorsed at 7 with a slight chance of dozing while sitting and reading, as a passenger in a car for an hour without a break, and while sitting quietly after lunch.  There is a moderate chance of dozing while watching television, and lying down to rest in the afternoon.   When I saw him for initial sleep evaluation in January 2019 his machine was beginning to malfunction and he was feeling that he was not getting adequate treatment.  Since it is been 7 to 8 years since his initial evaluation I recommended a repeat evaluation.  Apparently he had this done at [facility] sleep lab.  His diagnostic study on October 06, 2017 with mild sleep apnea overall with an AHI of 8.3, but his RDI was 14.9.  However, he had  severe sleep apnea with rem sleep with an AHI 51.4 and significant oxygen desaturation to 81%.  He also had frequent periodic limb movement disorder of sleep.  On that night, he did not meet split night criteria and returned in March for CPAP titration.  This was an inadequate titration.  Consequently he was set up with a CPAP auto unit with a minimum pressure of 6 a maximum of 20.  I saw him in May 2019 at which time he was 100% compliant since reinitiation December 14, 2017.  He is averaging 8 hours and 18 minutes of sleep per night.  AHI is excellent at 1.2.  There was only few days with mask leak.  His 95th percentile pressure was 13.5 with maximum average pressure 15.5.  Typically he goes to bed arouind 10 p.m. and wakes up at 8 am.  He notices significant improvement with this new machine.  It is much quieter.  He is sleeping well.  He does not have any breakthrough snoring. He denies any issues with painful restless legs despite being documented to have increased limb movement disorder of sleep.   I evaluated him on 01/26/2019 in a telemedicine visit at which time he had continued to do well since his prior evaluation.  I was able to a new download  from Dec 27, 2018 through January 25, 2019.  He is 100% compliant.  Average sleep duration is 9 hours and 13 minutes.  CPAP is set at a pressure range of 10 to 20 cm of water.  AHI is excellent at 0.9.  There is no leak.  His 95th percentile pressure is 12.6 with a maximum average pressure at 14.4.  He has a ResMed F 20 fullface mask.  He admits to not being very active particularly during this COVID-19 pandemic.  He denies any chest pain PND orthopnea or palpitations.  He continues to be on therapy for prostate CA followed by Dr. Laverle Patter.   I last evaluated him on February 02, 2020 in a telemedicine evaluation.  At that time, he continued to do well and had seen Dr. Rubie Maid for a yearly follow-up evaluation.  From a sleep perspective, he has felt well and has continued to  use CPAP.  I obtained a new download from May 9 through January 30, 2020.  This continues to show excellent compliance.  He has used his machine every night except to when he was in [place].  Average sleep duration is 10 hours and 44 minutes.  His CPAP auto unit is set at a pressure range of 10 to 20 cm.  95th percentile pressure is 13.9 with a maximum average pressure of 15.6 cm of water.  AHI is excellent at 1.4/h.  There is no leak.  He was using a ResMed AirFit F20 fullface mask.  He is unaware of any breakthrough snoring.  He denies residual daytime sleepiness.  [employer] is his DME company.  He continues to follow with Dr. Laverle Patter for his prostate CA.  His blood pressure has been stable on enalapril 10 mg daily.  He continues to take Effexor and Abilify for depression.  He is on pantoprazole for GERD.  He has not had significant success with weight loss.  He continues to be on rosuvastatin for hyperlipidemia   Since my last evaluation, he was seen by Dr. Croitoru inJuly 2022 and had experience of very mild shortness of breath with activity.  He underwent a follow-up echo Doppler study in March 15, 2021 which essentially was normal.  He was advised that he may need to see a pulmonary specialist to adjust inhalers.  Weight loss was recommended.   I last saw him on September 16, 2021 at which time he felt well and admitted to 100% CPAP compliance. I obtained a download of his CPAP from August 15, 2021 through September 13, 2021.  Compliance remains excellent with average use of 7 hours and 21 minutes per night.  His CPAP is set at a 10 to 20 cm pressure range and his 95th percentile is 12.5 with maximum average pressure 13.8 cm of water.  AHI is excellent at 1.0.  He is unaware of breakthrough snoring.  His sleep is restorative.  An Epworth scale was calculated in the office today and this endorsed at 6 arguing against residual daytime sleepiness.  He states his blood pressure has been stable and he has continued  to be on enalapril 10 mg daily.  He takes rosuvastatin for hyperlipidemia.  He has been taking desipramine 100 mg at bedtime for sleep.  He continued to be on Abilify for depression and Effexor.    I last saw him on November 05, 2022 and prior to that visit he had seen Dr. Royann Shivers last in December 2023.  He was stable from a cardiovascular perspective.  He has continued to use CPAP with 100% compliance.  I obtained a download from October 06, 2022 through November 04, 2022.  Compliance continues to be excellent with average use of 7 hours and 32 minutes per night.  His pressure is set at a range of 10 to 20 cm of water and AHI 0.9 with his 95th percentile pressure at 12.7 and maximum average pressure at 14.2 cm of water.  He is unaware of breakthrough snoring.  He is sleeping well.  He denies residual daytime sleepiness.  An Epworth scale was recalculated in the office today and this endorsed at 5.     Since I last saw him he was evaluated by Dr. Royann Shivers on August 11, 2023 and remained fairly stable.  From a sleep perspective he has continued to use CPAP therapy.  He now uses [employer] as his DME company.  His set up date was December 14, 2017.  A download from her 34 through November 15, 2023 shows 100% use with average use at 8 hours and 25 minutes.  At a pressure range of 10 to 20 cm of water, AHI is 0.5.  His 95th percentile pressure is 12.8 and maximum pressure 14.0.  He does feel that he is not getting enough pressure at the start of treatment.  He continues to have stable blood pressure on his regimen of enalapril 10 mg.  He is on rosuvastatin 40 mg hyperlipidemia, pantoprazole 40 mg for GERD, and Plaquenil for his rheumatoid arthritis.  He presents for evaluation.   Past Medical History:  Diagnosis Date   Anemia    Anxiety    Arthritis    Bladder stone    Cancer (HCC) 2007   prostate CA   COPD (chronic obstructive pulmonary disease) (HCC)    Decreased pulse 08/16/2008   LE doppler - bilateral ABIs  normal, bilateral PVRs normal waveform; bilateral common iliac arteries difficult to visualize however velocities suggest less than 50% diameter reduction; bilateral LE normal velocities w/o evidence of diameter reduction   Depression    GERD (gastroesophageal reflux disease)    Hypercholesteremia    Hypertension    Mental disorder    PTSD   Pre-diabetes    Shortness of breath    Sleep apnea    wears CPAP nightly, settings at 14    Tobacco abuse    Urinary incontinence    due to prostate removal    Past Surgical History:  Procedure Laterality Date   ANTERIOR LAT LUMBAR FUSION N/A 11/17/2012   Procedure: XLIF L3 - L4 POSTERIOR L3 -L4 FUSION INSTRUMENTATION/L2 - L3 DECOMPRESSION (REVISION OF POSTERIOR HARDWARE) 1 LEVEL;  Surgeon: Venita Lick, MD;  Location: [facility] OR;  Service: Orthopedics;  Laterality: N/A;  procedure #1: start 0923, end 1057.    BACK SURGERY  2010 / 2014   BLADDER SUSPENSION     x 2   CARDIAC CATHETERIZATION     no PCI   CARDIOVASCULAR STRESS TEST  08/16/2008   EF 57%; inferior region has diaphragmatic attenuation, otherwise normal perfusion w/o evidence of ischemia or infarct; LV normal in size; no scintographic evidence of inducible ischemia; no ECG changes, negative for ischemia; low risk scan   CERVICAL FUSION     COLON RESECTION N/A 07/12/2013   peritoneal biopsy only, evidence of metastatic prostate cancer   COLONOSCOPY     COLONOSCOPY N/A 05/27/2013   RMR: Extrinsic mass effect at the level of the appendiceal orifice likely represents an appendiceal or periappendiceal process.  Neovascular changes of the rectal mucosa consistent with prior history of radiation treatment. Multiple 3-5 mm polyps removed, several tubular adenomas. Diverticulosis.nex tcs 2019   COLONOSCOPY WITH PROPOFOL N/A 07/05/2018   Procedure: COLONOSCOPY WITH PROPOFOL;  Surgeon: Corbin Ade, MD;  Location: [facility];  Service: Endoscopy;  Laterality: N/A;  9:30am   COLONOSCOPY WITH  PROPOFOL N/A 03/07/2021   Procedure: COLONOSCOPY WITH PROPOFOL;  Surgeon: Corbin Ade, MD;  Location: [facility];  Service: Endoscopy;  Laterality: N/A;  2:30pm   CYSTOSCOPY W/ URETERAL STENT PLACEMENT N/A 10/03/2013   Procedure: CYSTOSCOPY AND REMOVAL OF BLADDER NECK STONE ;  Surgeon: Crecencio Mc, MD;  Location: [facility];  Service: Urology;  Laterality: N/A;  WITH REMOVAL OF BLADDER NECK STONE     ESOPHAGOGASTRODUODENOSCOPY (EGD) WITH ESOPHAGEAL DILATION N/A 05/27/2013   RMR: Mild erosive reflux esophagitis with soft noncritical appearing stricture status post Maloney dilation, antral erosions with reactive gastropathy but no H. pylori   ESOPHAGOGASTRODUODENOSCOPY (EGD) WITH PROPOFOL N/A 07/05/2018   Procedure: ESOPHAGOGASTRODUODENOSCOPY (EGD) WITH PROPOFOL;  Surgeon: Corbin Ade, MD;  [facility]: [facility];  Service: Endoscopy;  Laterality: N/A;   ESOPHAGOGASTRODUODENOSCOPY (EGD) WITH PROPOFOL N/A 02/05/2023   Procedure: ESOPHAGOGASTRODUODENOSCOPY (EGD) WITH PROPOFOL;  Surgeon: Corbin Ade, MD;  Location: [facility];  Service: Endoscopy;  Laterality: N/A;  11:30 am, asa 3   HOLMIUM LASER APPLICATION N/A 10/03/2013   Procedure: HOLMIUM LASER APPLICATION;  Surgeon: Crecencio Mc, MD;  Location: [facility];  Service: Urology;  Laterality: N/A;   IR FLUORO GUIDE CV LINE RIGHT  10/08/2023   IR REMOVAL TUN CV CATH W/O FL  11/13/2023   IR US GUIDE VASC ACCESS RIGHT  10/08/2023   MALONEY DILATION N/A 07/05/2018   Procedure: Elease Hashimoto DILATION;  Surgeon: Corbin Ade, MD;  [facility]: [facility];  Service: Endoscopy;  Laterality: N/A;   MALONEY DILATION N/A 02/05/2023   Procedure: Elease Hashimoto DILATION;  Surgeon: Corbin Ade, MD;  [facility]: [facility];  Service: Endoscopy;  Laterality: N/A;   POLYPECTOMY  07/05/2018   Procedure: POLYPECTOMY;  Surgeon: Corbin Ade, MD;  [facility];  Service: Endoscopy;;  cecal polyp , splenic flexure polyps x4   POLYPECTOMY  03/07/2021   Procedure:  POLYPECTOMY;  Surgeon: Corbin Ade, MD;  [facility]: [facility];  Service: Endoscopy;;   PROSTATE SURGERY     TRANSTHORACIC ECHOCARDIOGRAM  08/16/2008   essentially normal for age   URINARY SPHINCTER IMPLANT N/A 01/31/2014   Procedure: IMPLANTATION ARTIFICIAL SPHINCTER  WITH CYSTOSCOPY;  Surgeon: Martina Sinner, MD;  Location: [facility];  Service: Urology;  Laterality: N/A;   WRIST SURGERY Right     Current Medications: Outpatient Medications Prior to Visit  Medication Sig Dispense Refill   ARIPiprazole (ABILIFY) 10 MG tablet Take 10 mg by mouth at bedtime.     Choline Fenofibrate 135 MG capsule Take 135 mg by mouth at bedtime. trilipix     Coenzyme Q10 (CO Q 10) 100 MG CAPS Take 100 mg by mouth every evening.     desipramine (NOPRAMIN) 100 MG tablet Take 100 mg by mouth at bedtime.   0   enalapril (VASOTEC) 10 MG tablet Take 10 mg by mouth every morning.      enzalutamide (XTANDI) 40 MG capsule Take 4 capsules (160 mg total) by mouth daily. Takes 4 capsules daily 120 capsule 11   ferrous sulfate 325 (65 FE) MG tablet Take 325 mg by mouth  daily.   0   hydroxychloroquine (PLAQUENIL) 200 MG tablet Take 200 mg by mouth 2 (two) times daily.     Krill Oil 300 MG CAPS Take 300 mg by mouth daily.     meloxicam (MOBIC) 7.5 MG tablet Take 7.5 mg by mouth 2 (two) times daily.     Multiple Vitamin (MULTIVITAMIN WITH MINERALS) TABS tablet Take 1 tablet by mouth daily.     pantoprazole (PROTONIX) 40 MG tablet Take 1 tablet (40 mg total) by mouth daily. 30 minutes before breakfast 90 tablet 3   rosuvastatin (CRESTOR) 40 MG tablet TAKE 1 TABLET DAILY 90 tablet 3   venlafaxine XR (EFFEXOR-XR) 75 MG 24 hr capsule Take 225 mg by mouth daily.  0   prochlorperazine (COMPAZINE) 10 MG tablet TAKE 1 TABLET BY MOUTH EVERY 6 HOURS AS NEEDED FOR NAUSEA (TAKE 30 MINUTES BEFORE radiation) 30 tablet 2   sucralfate (CARAFATE) 1 g tablet Take 1 tablet (1 g total) by mouth 4 (four) times daily -  with meals and at  bedtime. 40 tablet 1   No facility-administered medications prior to visit.     Allergies:   Penicillins   Social History   Socioeconomic History   Marital status: Married    Spouse name: Not on file   Number of children: Not on file   Years of education: Not on file   Highest education level: Not on file  Occupational History   Occupation: driving truck    Comment: retired  Tobacco Use   Smoking status: Former    Current packs/day: 0.00    Average packs/day: 0.3 packs/day for 43.0 years (10.8 ttl pk-yrs)    Types: Cigarettes    Start date: 02/28/1970    Quit date: 02/28/2013    Years since quitting: 10.7   Smokeless tobacco: Former    Quit date: 06/30/1980  Vaping Use   Vaping status: Never Used  Substance and Sexual Activity   Alcohol use: No   Drug use: No   Sexual activity: Yes    Birth control/protection: None  Other Topics Concern   Not on file  Social History Narrative   Not on file   Social Drivers of Health   Financial Resource Strain: Not on file  Food Insecurity: No Food Insecurity (06/01/2023)   Hunger Vital Sign    Worried About Running Out of Food in the Last Year: Never true    Ran Out of Food in the Last Year: Never true  Transportation Needs: No Transportation Needs (06/01/2023)   PRAPARE - Administrator, Civil Service (Medical): No    Lack of Transportation (Non-Medical): No  Physical Activity: Not on file  Stress: Not on file  Social Connections: Not on file    Retired as a Naval architect at age 57  Family History:  The patient's family history includes Cancer in his paternal uncle; Colon cancer in his cousin; Colon polyps in his father and sister; Diabetes in his brother, father, and mother; Heart disease in his mother; Hypertension in his brother, father, and mother; Prostate cancer (age of onset: 21) in his cousin.   ROS General: Negative; No fevers, chills, or night sweats;  HEENT: Negative; No changes in vision or hearing, sinus  congestion, difficulty swallowing Pulmonary: Occasional wheezing improved with inhaler Cardiovascular: Hypertension, no chest pain GI: GERD GU: Negative; No dysuria, hematuria, or difficulty voiding Musculoskeletal: Negative; no myalgias, joint pain, or weakness Rheumatologic: Seronegative rheumatoid arthritis Hematologic/Oncology: Negative; no easy bruising,  bleeding Endocrine: Negative; no heat/cold intolerance; no diabetes Neuro: Negative; no changes in balance, headaches Skin: Negative; No rashes or skin lesions Psychiatric: History of depression Sleep: Positive for OSA on CPAP; No snoring, daytime sleepiness, hypersomnolence, bruxism, restless legs, hypnogognic hallucinations, no cataplexy An Epworth Sleepiness Scale score was calculated in the office today and this endorsed at 5.  Other comprehensive 14 point system review is negative.   PHYSICAL EXAM:   VS:  BP 104/70   Pulse 88   Ht 6\' 2"  (1.88 m)   Wt 256 lb 3.2 oz (116.2 kg)   SpO2 93%   BMI 32.89 kg/m     Repeat blood pressure by me was 124/78  Wt Readings from Last 3 Encounters:  11/18/23 256 lb 3.2 oz (116.2 kg)  11/12/23 252 lb 8 oz (114.5 kg)  10/29/23 259 lb 4.8 oz (117.6 kg)   General: Alert, oriented, no distress.  Skin: normal turgor, no rashes, warm and dry HEENT: Normocephalic, atraumatic. Pupils equal round and reactive to light; sclera anicteric; extraocular muscles intact;  Nose without nasal septal hypertrophy Mouth/Parynx benign; Mallinpatti scale 4 Neck: No JVD, no carotid bruits; normal carotid upstroke Lungs: clear to ausculatation and percussion; no wheezing or rales Chest wall: without tenderness to palpitation Heart: PMI not displaced, RRR, s1 s2 normal, 1/6 systolic murmur, no diastolic murmur, no rubs, gallops, thrills, or heaves Abdomen: soft, nontender; no hepatosplenomehaly, BS+; abdominal aorta nontender and not dilated by palpation. Back: no CVA tenderness Pulses 2+ Musculoskeletal:  full range of motion, normal strength, no joint deformities Extremities: no clubbing cyanosis or edema, Homan's sign negative  Neurologic: grossly nonfocal; Cranial nerves grossly wnl Psychologic: Normal mood and affect      Studies/Labs Reviewed:   EKG Interpretation Date/Time:  Wednesday November 18 2023 10:05:45 EDT Ventricular Rate:  88 PR Interval:  154 QRS Duration:  88 QT Interval:  382 QTC Calculation: 462 R Axis:   34  Text Interpretation: Normal sinus rhythm Normal ECG When compared with ECG of 11-Aug-2023 10:00, No significant change was found Confirmed by Nicki Guadalajara ([id number]) on 11/19/2023 4:11:47 PM    I personally reviewed the ECG of July 30, 2022 which showed normal sinus rhythm at 76 bpm.  I personally reviewed the ECG of February 27, 2021 which revealed normal sinus rhythm at 72 with normal intervals and no ectopy.  I personally reviewed the ECG of January 31, 2020 which showed normal sinus rhythm at 78 without ectopy.  No ST segment changes, QTc interval 450 ms.  Recent Labs:    Latest Ref Rng & Units 11/09/2023   10:19 AM 11/04/2023    9:36 AM 10/08/2023   10:00 AM  BMP  Glucose 70 - 99 mg/dL 784  696  295   BUN 8 - 23 mg/dL 18  17  20    Creatinine 0.61 - 1.24 mg/dL 2.84  1.32  4.40   Sodium 135 - 145 mmol/L 136  137  138   Potassium 3.5 - 5.1 mmol/L 3.9  3.8  4.0   Chloride 98 - 111 mmol/L 103  105  106   CO2 22 - 32 mmol/L 26  26  24    Calcium 8.9 - 10.3 mg/dL 9.4  9.4  9.3         Latest Ref Rng & Units 11/09/2023   10:19 AM 11/04/2023    9:36 AM 08/31/2023   12:09 PM  Hepatic Function  Total Protein 6.5 - 8.1 g/dL 7.3  6.6  7.5   Albumin 3.5 - 5.0 g/dL 4.3  4.0  4.5   AST 15 - 41 U/L 17  16  17    ALT 0 - 44 U/L 11  10  13    Alk Phosphatase 38 - 126 U/L 55  48  46   Total Bilirubin 0.0 - 1.2 mg/dL 0.2  0.2  0.4        Latest Ref Rng & Units 11/09/2023   10:19 AM 11/04/2023    9:36 AM 10/29/2023   10:51 AM  CBC  WBC 4.0 - 10.5 K/uL 6.0  5.4  4.0    Hemoglobin 13.0 - 17.0 g/dL 9.6  8.9  9.0   Hematocrit 39.0 - 52.0 % 29.0  26.2  27.4   Platelets 150 - 400 K/uL 263  228  210    Lab Results  Component Value Date   MCV 102.8 (H) 11/09/2023   MCV 100.8 (H) 11/04/2023   MCV 104.6 (H) 10/29/2023   No results found for: "TSH" No results found for: "HGBA1C"   BNP No results found for: "BNP"  ProBNP No results found for: "PROBNP"   Lipid Panel     Component Value Date/Time   CHOL 168 06/08/2019 0819   TRIG 207 (H) 06/08/2019 0819   HDL 35 (L) 06/08/2019 0819   CHOLHDL 4.8 06/08/2019 0819   LDLCALC 97 06/08/2019 0819   LABVLDL 36 06/08/2019 0819     RADIOLOGY: IR Removal Tun Cv Cath W/O FL Result Date: 11/13/2023 INDICATION: 68 year old male referred for removal of tunneled hemodialysis catheter EXAM: REMOVAL TUNNELED CENTRAL VENOUS CATHETER MEDICATIONS: None. ANESTHESIA/SEDATION: Moderate (conscious) sedation was not employed during this procedure. FLUOROSCOPY TIME:  None COMPLICATIONS: None PROCEDURE: Informed written consent was obtained from the patient after a thorough discussion of the procedural risks, benefits and alternatives. All questions were addressed. Maximal Sterile Barrier Technique was utilized including caps, mask, sterile gowns, sterile gloves, sterile drape, hand hygiene and skin antiseptic. A timeout was performed prior to the initiation of the procedure. The patient's right chest and catheter was prepped and draped in a normal sterile fashion. Heparin was removed from both ports of catheter. 1% lidocaine was used for local anesthesia. Using gentle blunt dissection the cuff of the catheter was exposed and the catheter was removed in it's entirety. Pressure was held till hemostasis was obtained. A sterile dressing was applied. The patient tolerated the procedure well with no immediate complications. IMPRESSION: Status post removal of tunneled hemodialysis catheter Signed, Yvone Neu. Miachel Roux, RPVI [facility] Electronically Signed   By: Gilmer Mor D.O.   On: 11/13/2023 12:59     Additional studies/ records that were reviewed today include:   March 10. 2019 CLINICAL INFORMATION The patient is referred for a CPAP titration to treat sleep apnea.   Date of NPSG: 10/06/2017: AHI 8.3/h; RDI 14.9/h; AHI during REM sleep 51.4/h; , oxygen desaturation to 81%.   SLEEP STUDY TECHNIQUE As per the AASM Manual for the Scoring of Sleep and Associated Events v2.3 (April 2016) with a hypopnea requiring 4% desaturations.   The channels recorded and monitored were frontal, central and occipital EEG, electrooculogram (EOG), submentalis EMG (chin), nasal and oral airflow, thoracic and abdominal wall motion, anterior tibialis EMG, snore microphone, electrocardiogram, and pulse oximetry. Continuous positive airway pressure (CPAP) was initiated at the beginning of the study and titrated to treat sleep-disordered breathing.   MEDICATIONS     Calcium Carb-Cholecalciferol (CALCIUM 1000 +  D PO)         Choline Fenofibrate (TRILIPIX) 135 MG capsule         Coenzyme Q10 (CO Q 10) 100 MG CAPS         desipramine (NOPRAMIN) 100 MG tablet         enalapril (VASOTEC) 10 MG tablet         ferrous sulfate 325 (65 FE) MG tablet         Krill Oil 300 MG CAPS         leuprolide (LUPRON) 30 MG injection         megestrol (MEGACE) 20 MG tablet         meloxicam (MOBIC) 7.5 MG tablet         Multiple Vitamin (MULTIVITAMIN WITH MINERALS) TABS tablet         pantoprazole (PROTONIX) 40 MG tablet         simvastatin (ZOCOR) 40 MG tablet         venlafaxine XR (EFFEXOR-XR) 75 MG 24 hr capsule       Medications self-administered by patient taken the night of the study : N/A   TECHNICIAN COMMENTS Comments added by technician: CPAP therapy started at 5 cm of H20 and increased to 7 cm of H20 in lateral positions. Patient unable to obtain sleep in supine position. PLMs were  noted throughout testing. Patient tolerated CPAP very well. Suboptimal pressure obtained due to supine REM stage not observed. Oral breathing noticed throughout therapy   RESPIRATORY PARAMETERS Optimal PAP Pressure (cm):   AHI at Optimal Pressure (/hr):            N/A Overall Minimal O2 (%):         92.0     Supine % at Optimal Pressure (%):    N/A Minimal O2 at Optimal Pressure (%): 92.0        SLEEP ARCHITECTURE The study was initiated at 9:52:40 PM and ended at 5:51:32 AM.   Sleep onset time was 61.7 minutes and the sleep efficiency was 60.6%%. The total sleep time was 290.1 minutes.   The patient spent 8.4%% of the night in stage N1 sleep, 58.8%% in stage N2 sleep, 24.7%% in stage N3 and 8.10% in REM.Stage REM latency was 219.0 minutes   Wake after sleep onset was 127.0. Alpha intrusion was absent. Supine sleep was 0.00%.   CARDIAC DATA The 2 lead EKG demonstrated sinus rhythm. The mean heart rate was N/A beats per minute. Other EKG findings include: None.   LEG MOVEMENT DATA The total Periodic Limb Movements of Sleep (PLMS) were 0. The PLMS index was 0.0. A PLMS index of <15 is considered normal in adults.   IMPRESSIONS - An optimal PAP pressure could not be selected for this patient based on the available study data. - Central sleep apnea was not noted during this titration (CAI = 0.0/h). - Significant oxygen desaturations were not observed during this titration (min O2 = 92.0%). - The patient snored with loud snoring volume during this titration study. - No cardiac abnormalities were observed during this study. - Clinically significant periodic limb movements were not noted during this study. Arousals associated with PLMs were rare.   DIAGNOSIS - Obstructive Sleep Apnea (327.23 [G47.33 ICD-10])   RECOMMENDATIONS - Since the patient had previously documented severe sleep apnea during REM sleep whichwas not achieved on the present titration, recommend an initial trial of CPAP  Auto with C-Flex/EPR of 3 at 6- 20  cm H2O with heated humidification and mask of choice. - Efforts should be made to maximize nasal and oral pharyngeal patency. - Avoid alcohol, sedatives and other CNS depressants that may worsen sleep apnea and disrupt normal sleep architecture. - Sleep hygiene should be reviewed to assess factors that may improve sleep quality. - Weight management and regular exercise should be initiated or continued. - Recommend adownload be obtained in 30 days and the patient return to sleep clinic for re-evaluation after 4 weeks of therapy.    ASSESSMENT:    1. OSA (obstructive sleep apnea) on CPAP   2. Essential hypertension   3. Type 2 diabetes mellitus with obesity (HCC)   4. Depression, unspecified depression type   5. COPD (chronic obstructive pulmonary disease) with chronic bronchitis (HCC)   6. Mild obesity     PLAN:  Mr. [name] is a 68 year-old gentleman who has a history of hypertension, hyperlipidemia, COPD, diabetes mellitus, obesity, metastatic prostate CA, as well as obstructive sleep apnea.  His diagnosis of OSA dates back to 2012.  Initially he had had an old Respironics CPAP machine and on December 14, 2017 he received a ResMed air sense 10 AutoSet unit.  His current machine is set at a pressure range of 10 to 20 cm.  He has had consistent compliance.  [employer] is his DME company.  I obtained a new download from October 06, 2022 through November 04, 2022 which showed an excellent AHI at 0.9 and his 95th percentile pressure 12.7 with maximum average pressure 14.2 cm of water.  He was using it with 100% compliance and averaging 7 hours and 32 minutes of CPAP use per night.  He is unaware of breakthrough snoring.  He does not have any residual daytime sleepiness with an Epworth scale calculated at 5 today.  Last year, he has continued to use CPAP with excellent compliance.  His download shows 100% use.  AHI is excellent at 0.5 and his pressure set at a range of 10  to 20 cm of water.  His 95th percentile pressure is 12.8 with maximum average pressure 14.0.  He states that he feels like he is not getting enough pressure at the start of the.  As result I will change his pressure range from 10-20 up to 12 to 20 cm of water.  He is having excellent response to therapy.  An Epworth Sleepiness Scale score endorsed at 6 arguing against residual daytime sleepiness.  Blood pressure today is stable on current therapy enalapril 10 mg.  He is on rosuvastatin 40 mg for hyperlipidemia.  He takes Abilify and Effexor for anxiety/depression.  He is on Plaquenil with his rheumatoid arthritis.  He will follow-up with Dr. Royann Shivers.  His current CPAP machine set up date is April 2019.  I discussed with him that he qualifies for a new CPAP machine.  He wishes to pursue this and I will schedule him to receive a new ResMed AirSense 11 AutoSet unit via [employer] on his current machine is still working this way he will also have a backup in the future if necessary.  Will need to see him within 90 days following receiving a new machine and he is aware of my upcoming retirement at the end of June.  He will then be transitioned to the sleep care of Dr. Mayford Knife following my retirement.    Medication Adjustments/Labs and Tests Ordered: Current medicines are reviewed at length with the patient today.  Concerns regarding medicines are outlined  above.  Medication changes, Labs and Tests ordered today are listed in the Patient Instructions below. Patient Instructions  Medication Instructions:  No medication changes were made during today's visit.  *If you need a refill on your cardiac medications before your next appointment, please call your pharmacy*   Lab Work: No labs were ordered during today's visit.  If you have labs (blood work) drawn today and your tests are completely normal, you will receive your results only by: MyChart Message (if you have MyChart) OR A paper copy in the mail If you  have any lab test that is abnormal or we need to change your treatment, we will call you to review the results.   Testing/Procedures: No procedures were ordered during today's visit.    Follow-Up: At [facility], you and your health needs are our priority.  As part of our continuing mission to provide you with exceptional heart care, we have created designated Provider Care Teams.  These Care Teams include your primary Cardiologist (physician) and Advanced Practice Providers (APPs -  Physician Assistants and Nurse Practitioners) who all work together to provide you with the care you need, when you need it.  We recommend signing up for the patient portal called "MyChart".  Sign up information is provided on this After Visit Summary.  MyChart is used to connect with patients for Virtual Visits (Telemedicine).  Patients are able to view lab/test results, encounter notes, upcoming appointments, etc.  Non-urgent messages can be sent to your provider as well.   To learn more about what you can do with MyChart, go to [email or web address].    Your next appointment:   3 month(s)  Provider:   Dr. Tresa Endo     Other Instructions [facility]  [address] [place], [place] [address] OPEN[address] [facility] [phone]       1st Floor: - Lobby - Registration  - Pharmacy  - Lab - Cafe   2nd Floor: - PV Lab - Diagnostic Testing (echo, CT, nuclear med)   3rd Floor: - Vacant   4th Floor: - TCTS (cardiothoracic surgery) - AFib Clinic - Structural Heart Clinic - Vascular Surgery  - Vascular Ultrasound   5th Floor: - HeartCare Cardiology (general and EP) - Clinical Pharmacy for coumadin, hypertension, lipid, weight-loss medications, and med management appointments      Valet parking services will be available as well.           Signed, Nicki Guadalajara, MD [facility], ABSM Diplomate, American Board of Sleep Medicine  11/19/2023 4:18 PM    [facility]  [facility] [address], [address], [place], [place]  [address] Phone: [phone]

## 2023-11-18 NOTE — Patient Instructions (Signed)
 Medication Instructions:  No medication changes were made during today's visit.  *If you need a refill on your cardiac medications before your next appointment, please call your pharmacy*   Lab Work: No labs were ordered during today's visit.  If you have labs (blood work) drawn today and your tests are completely normal, you will receive your results only by: MyChart Message (if you have MyChart) OR A paper copy in the mail If you have any lab test that is abnormal or we need to change your treatment, we will call you to review the results.   Testing/Procedures: No procedures were ordered during today's visit.    Follow-Up: At [facility], you and your health needs are our priority.  As part of our continuing mission to provide you with exceptional heart care, we have created designated Provider Care Teams.  These Care Teams include your primary Cardiologist (physician) and Advanced Practice Providers (APPs -  Physician Assistants and Nurse Practitioners) who all work together to provide you with the care you need, when you need it.  We recommend signing up for the patient portal called "MyChart".  Sign up information is provided on this After Visit Summary.  MyChart is used to connect with patients for Virtual Visits (Telemedicine).  Patients are able to view lab/test results, encounter notes, upcoming appointments, etc.  Non-urgent messages can be sent to your provider as well.   To learn more about what you can do with MyChart, go to [email or web address].    Your next appointment:   3 month(s)  Provider:   Dr. Tresa Endo     Other Instructions [facility]  [address] [place], [place]  [address] OPENING [facility] [phone]       1st Floor: - Lobby - Registration  - Pharmacy  - Lab - Cafe   2nd Floor: - PV Lab - Diagnostic Testing (echo, CT, nuclear med)   3rd Floor: - Vacant   4th Floor: - TCTS (cardiothoracic surgery) - AFib  Clinic - Structural Heart Clinic - Vascular Surgery  - Vascular Ultrasound   5th Floor: - HeartCare Cardiology (general and EP) - Clinical Pharmacy for coumadin, hypertension, lipid, weight-loss medications, and med management appointments      Valet parking services will be available as well.

## 2023-11-19 ENCOUNTER — Other Ambulatory Visit: Payer: Self-pay

## 2023-11-19 ENCOUNTER — Encounter: Payer: Self-pay | Admitting: Cardiovascular Disease

## 2023-11-19 ENCOUNTER — Ambulatory Visit: Payer: Medicare Other

## 2023-11-23 ENCOUNTER — Other Ambulatory Visit (HOSPITAL_COMMUNITY): Payer: Self-pay | Admitting: Internal Medicine

## 2023-11-23 ENCOUNTER — Ambulatory Visit (HOSPITAL_COMMUNITY)
Admission: RE | Admit: 2023-11-23 | Discharge: 2023-11-23 | Disposition: A | Discharge status: Home / Self Care | Source: Ambulatory Visit | Attending: Internal Medicine | Admitting: Internal Medicine

## 2023-11-23 DIAGNOSIS — R0600 Dyspnea, unspecified: Secondary | ICD-10-CM

## 2023-11-25 ENCOUNTER — Other Ambulatory Visit: Payer: Self-pay

## 2023-11-27 ENCOUNTER — Other Ambulatory Visit: Payer: Self-pay

## 2023-11-30 ENCOUNTER — Ambulatory Visit: Payer: Medicare Other

## 2023-11-30 ENCOUNTER — Other Ambulatory Visit: Payer: Medicare Other

## 2023-11-30 ENCOUNTER — Other Ambulatory Visit

## 2023-11-30 ENCOUNTER — Other Ambulatory Visit: Payer: Self-pay

## 2023-11-30 ENCOUNTER — Ambulatory Visit

## 2023-11-30 DIAGNOSIS — J810 Acute pulmonary edema: Secondary | ICD-10-CM

## 2023-12-01 ENCOUNTER — Emergency Department (HOSPITAL_COMMUNITY)

## 2023-12-01 ENCOUNTER — Other Ambulatory Visit: Payer: Self-pay

## 2023-12-01 ENCOUNTER — Observation Stay (HOSPITAL_COMMUNITY)
Admission: EM | Admit: 2023-12-01 | Discharge: 2023-12-02 | Disposition: A | Discharge status: Home / Self Care | Attending: Family Medicine | Admitting: Family Medicine

## 2023-12-01 ENCOUNTER — Ambulatory Visit (HOSPITAL_COMMUNITY)
Admission: RE | Admit: 2023-12-01 | Discharge: 2023-12-01 | Disposition: A | Discharge status: Home / Self Care | Source: Ambulatory Visit | Attending: Internal Medicine | Admitting: Internal Medicine

## 2023-12-01 DIAGNOSIS — R0602 Shortness of breath: Secondary | ICD-10-CM | POA: Diagnosis present

## 2023-12-01 DIAGNOSIS — I10 Essential (primary) hypertension: Secondary | ICD-10-CM | POA: Diagnosis not present

## 2023-12-01 DIAGNOSIS — C7982 Secondary malignant neoplasm of genital organs: Secondary | ICD-10-CM | POA: Insufficient documentation

## 2023-12-01 DIAGNOSIS — Z87891 Personal history of nicotine dependence: Secondary | ICD-10-CM | POA: Insufficient documentation

## 2023-12-01 DIAGNOSIS — Z79899 Other long term (current) drug therapy: Secondary | ICD-10-CM | POA: Insufficient documentation

## 2023-12-01 DIAGNOSIS — R0600 Dyspnea, unspecified: Secondary | ICD-10-CM | POA: Insufficient documentation

## 2023-12-01 DIAGNOSIS — J449 Chronic obstructive pulmonary disease, unspecified: Secondary | ICD-10-CM | POA: Diagnosis not present

## 2023-12-01 DIAGNOSIS — E782 Mixed hyperlipidemia: Secondary | ICD-10-CM | POA: Diagnosis not present

## 2023-12-01 DIAGNOSIS — K219 Gastro-esophageal reflux disease without esophagitis: Secondary | ICD-10-CM | POA: Diagnosis not present

## 2023-12-01 DIAGNOSIS — I2699 Other pulmonary embolism without acute cor pulmonale: Secondary | ICD-10-CM | POA: Diagnosis not present

## 2023-12-01 DIAGNOSIS — R9431 Abnormal electrocardiogram [ECG] [EKG]: Secondary | ICD-10-CM | POA: Diagnosis not present

## 2023-12-01 LAB — CBC WITH DIFFERENTIAL/PLATELET
Abs Immature Granulocytes: 0.08 10*3/uL — ABNORMAL HIGH (ref 0.00–0.07)
Basophils Absolute: 0.1 10*3/uL (ref 0.0–0.1)
Basophils Relative: 1 %
Eosinophils Absolute: 0.7 10*3/uL — ABNORMAL HIGH (ref 0.0–0.5)
Eosinophils Relative: 10 %
HCT: 32.2 % — ABNORMAL LOW (ref 39.0–52.0)
Hemoglobin: 10.1 g/dL — ABNORMAL LOW (ref 13.0–17.0)
Immature Granulocytes: 1 %
Lymphocytes Relative: 7 %
Lymphs Abs: 0.5 10*3/uL — ABNORMAL LOW (ref 0.7–4.0)
MCH: 32 pg (ref 26.0–34.0)
MCHC: 31.4 g/dL (ref 30.0–36.0)
MCV: 101.9 fL — ABNORMAL HIGH (ref 80.0–100.0)
Monocytes Absolute: 0.8 10*3/uL (ref 0.1–1.0)
Monocytes Relative: 11 %
Neutro Abs: 4.9 10*3/uL (ref 1.7–7.7)
Neutrophils Relative %: 70 %
Platelets: 310 10*3/uL (ref 150–400)
RBC: 3.16 MIL/uL — ABNORMAL LOW (ref 4.22–5.81)
RDW: 12.9 % (ref 11.5–15.5)
WBC: 7 10*3/uL (ref 4.0–10.5)
nRBC: 0 % (ref 0.0–0.2)

## 2023-12-01 LAB — TROPONIN I (HIGH SENSITIVITY)
Troponin I (High Sensitivity): 4 ng/L (ref <18)
Troponin I (High Sensitivity): 4 ng/L (ref <18)

## 2023-12-01 LAB — COMPREHENSIVE METABOLIC PANEL WITH GFR
ALT: 13 U/L (ref 0–44)
AST: 24 U/L (ref 15–41)
Albumin: 3.5 g/dL (ref 3.5–5.0)
Alkaline Phosphatase: 60 U/L (ref 38–126)
Anion gap: 14 (ref 5–15)
BUN: 17 mg/dL (ref 8–23)
CO2: 22 mmol/L (ref 22–32)
Calcium: 9.5 mg/dL (ref 8.9–10.3)
Chloride: 102 mmol/L (ref 98–111)
Creatinine, Ser: 0.7 mg/dL (ref 0.61–1.24)
GFR, Estimated: >60 mL/min (ref >60)
Glucose, Bld: 102 mg/dL — ABNORMAL HIGH (ref 70–99)
Potassium: 4.2 mmol/L (ref 3.5–5.1)
Sodium: 138 mmol/L (ref 135–145)
Total Bilirubin: 0.5 mg/dL (ref 0.0–1.2)
Total Protein: 7.8 g/dL (ref 6.5–8.1)

## 2023-12-01 LAB — BASIC METABOLIC PANEL WITH GFR
Anion gap: 12 (ref 5–15)
BUN: 17 mg/dL (ref 8–23)
CO2: 23 mmol/L (ref 22–32)
Calcium: 9.6 mg/dL (ref 8.9–10.3)
Chloride: 100 mmol/L (ref 98–111)
Creatinine, Ser: 0.69 mg/dL (ref 0.61–1.24)
GFR, Estimated: >60 mL/min (ref >60)
Glucose, Bld: 102 mg/dL — ABNORMAL HIGH (ref 70–99)
Potassium: 4 mmol/L (ref 3.5–5.1)
Sodium: 135 mmol/L (ref 135–145)

## 2023-12-01 LAB — PROTIME-INR
INR: 1.1 (ref 0.8–1.2)
Prothrombin Time: 14.1 s (ref 11.4–15.2)

## 2023-12-01 LAB — MAGNESIUM: Magnesium: 2.1 mg/dL (ref 1.7–2.4)

## 2023-12-01 MED ORDER — FENOFIBRATE 160 MG PO TABS
160.0000 mg | ORAL_TABLET | Freq: Every day | ORAL | Status: DC
  Administered 2023-12-02: 160 mg via ORAL
  Filled 2023-12-01: qty 1

## 2023-12-01 MED ORDER — ACETAMINOPHEN 325 MG PO TABS: 650.0000 mg | ORAL_TABLET | Freq: Four times a day (QID) | ORAL | Status: DC | PRN

## 2023-12-01 MED ORDER — PROCHLORPERAZINE EDISYLATE 10 MG/2ML IJ SOLN: 10.0000 mg | Freq: Four times a day (QID) | INTRAMUSCULAR | Status: DC | PRN

## 2023-12-01 MED ORDER — IOHEXOL 350 MG/ML SOLN
75.0000 mL | Freq: Once | INTRAVENOUS | Status: AC | PRN
Start: 2023-12-01 — End: 2023-12-01
  Administered 2023-12-01: 75 mL via INTRAVENOUS

## 2023-12-01 MED ORDER — HEPARIN (PORCINE) 25000 UT/250ML-% IV SOLN
1950.0000 [IU]/h | INTRAVENOUS | Status: DC
  Administered 2023-12-01: 1800 [IU]/h via INTRAVENOUS
  Administered 2023-12-02: 1950 [IU]/h via INTRAVENOUS
  Filled 2023-12-01 (×2): qty 250

## 2023-12-01 MED ORDER — PANTOPRAZOLE SODIUM 40 MG PO TBEC
40.0000 mg | DELAYED_RELEASE_TABLET | Freq: Every day | ORAL | Status: DC
  Administered 2023-12-02: 40 mg via ORAL
  Filled 2023-12-01: qty 1

## 2023-12-01 MED ORDER — CHLORHEXIDINE GLUCONATE CLOTH 2 % EX PADS
6.0000 | MEDICATED_PAD | Freq: Every day | CUTANEOUS | Status: DC
  Administered 2023-12-02: 6 via TOPICAL

## 2023-12-01 MED ORDER — HEPARIN BOLUS VIA INFUSION
6400.0000 [IU] | Freq: Once | INTRAVENOUS | Status: AC
  Administered 2023-12-01: 6400 [IU] via INTRAVENOUS

## 2023-12-01 MED ORDER — ROSUVASTATIN CALCIUM 20 MG PO TABS
40.0000 mg | ORAL_TABLET | Freq: Every day | ORAL | Status: DC
  Administered 2023-12-02: 40 mg via ORAL
  Filled 2023-12-01: qty 2

## 2023-12-01 NOTE — Consult Note (Addendum)
 PHARMACY - ANTICOAGULATION CONSULT NOTE  Pharmacy Consult for Heparin Indication: pulmonary embolus  Allergies  Allergen Reactions   Penicillins Anaphylaxis        Patient Measurements: Height: 6\' 2"  (188 cm) Weight: 114.3 kg (252 lb) IBW/kg (Calculated) : 82.2 HEPARIN DW (KG): 106.2  Vital Signs: Temp: 98.8 F (37.1 C) (04/08 1645) Temp Source: Oral (04/08 1645) BP: 105/66 (04/08 1645) Pulse Rate: 92 (04/08 1645)  Labs: No results for input(s): "HGB", "HCT", "PLT", "APTT", "LABPROT", "INR", "HEPARINUNFRC", "HEPRLOWMOCWT", "CREATININE", "CKTOTAL", "CKMB", "TROPONINIHS" in the last 72 hours.  CrCl cannot be calculated (Patient's most recent lab result is older than the maximum 21 days allowed.).  Medical History: Past Medical History:  Diagnosis Date   Anemia    Anxiety    Arthritis    Bladder stone    Cancer (HCC) 2007   prostate CA   COPD (chronic obstructive pulmonary disease) (HCC)    Decreased pulse 08/16/2008   LE doppler - bilateral ABIs normal, bilateral PVRs normal waveform; bilateral common iliac arteries difficult to visualize however velocities suggest less than 50% diameter reduction; bilateral LE normal velocities w/o evidence of diameter reduction   Depression    GERD (gastroesophageal reflux disease)    Hypercholesteremia    Hypertension    Mental disorder    PTSD   Pre-diabetes    Shortness of breath    Sleep apnea    wears CPAP nightly, settings at 14    Tobacco abuse    Urinary incontinence    due to prostate removal   Medications:  No PTA anticoagulation per chart review  Baseline Labs Hgb 10.1 Plt 310 INR/PT - pending   Assessment: RR is a 68 yo male who presented to the ED c/o PE in his lung. They received a phone call today to seek treatment in the ED due to the results of an outpatient CT scan. They've had a history significant for SOB and cough over the last 3 weeks. They have to rest after ambulating over short distances for  rest. They have a past medical history significant for prostate cancer. Pharmacy has been consulted to manage this patient's heparin.  Goal of Therapy:  Heparin level 0.3-0.7 units/ml Monitor platelets by anticoagulation protocol: Yes   Plan:  -- Give heparin bolus 6,400 units  -- Start heparin gtt at 1,800 units/hr -- Check heparin levels in 6 hours -- Continue to monitor CBC and platelets daily   Thank you for allowing pharmacy to participate in this patient's care.   Effie Shy, PharmD Pharmacy Resident  12/01/2023 7:31 PM

## 2023-12-01 NOTE — Care Management Obs Status (Signed)
 MEDICARE OBSERVATION STATUS NOTIFICATION   Patient Details  [name] MRN: [id number] Date of Birth: Oct 27, 1955   Medicare Observation Status Notification Given:  Yes    Barron Alvine, RN 12/01/2023, 9:50 PM

## 2023-12-01 NOTE — Plan of Care (Signed)

## 2023-12-01 NOTE — Progress Notes (Signed)
   12/01/23 2201  TOC Brief Assessment  Insurance and Status Reviewed  Patient has primary care physician Yes  Home environment has been reviewed From home c/wife  Prior level of function: Independent  Prior/Current Home Services No current home services  Social Drivers of Health Review SDOH reviewed no interventions necessary  Readmission risk has been reviewed Yes  Transition of care needs no transition of care needs at this time   Transition of Care Department [facility]) has reviewed patient and no other TOC needs have been identified at this time. We will continue to monitor patient advancement through interdisciplinary progression rounds. If new patient needs arise, please place a TOC consult.

## 2023-12-01 NOTE — ED Triage Notes (Signed)
 Pt arrived via POV c/o a PE in his lung. Pt reports receiving a phone call today after returning home form an outpatient CT Scan and was told was advised to seek treatment in the ER due to on-going SOB.

## 2023-12-01 NOTE — H&P (Signed)
 History and Physical    Patient: [name] XBJ:[id number] DOB: 20-Jan-1956 DOA: 12/01/2023 DOS: the patient was seen and examined on 12/01/2023 PCP: [name], MD  Patient coming from: Home  Chief Complaint:  Chief Complaint  Patient presents with   Pulmonary Embolism   HPI: [name] is a 68 y.o. male with medical history significant of hypertension, hyperlipidemia, GERD, metastatic prostate cancer on enzalutamide and sipuleucel-T (per ED medical record) who presents to the emergency department due to 3-week onset of cough and shortness of breath that worsens with exertion.  He had an outpatient CT angiography of chest which showed PE in the right lower lobe and lateral segmental pulmonary artery.  He was asked to go to the ED for further evaluation and management.  Patient denies any chest pain, but endorsed some shortness of breath.  ED Course:  In the emergency department, BP was soft at 105/66, but other vital signs were within normal range.  Workup in the ED showed macrocytic anemia,, BMP was normal except for blood glucose of 102.  Troponin x 1 - 4, magnesium 2.1. CT angiography chest with contrast showed pulmonary embolism within the right lower lobe lateral segmental pulmonary artery. Patient was started on heparin drip.  Hospitalist was asked to admit patient for further evaluation and management.   Review of Systems: Review of systems as noted in the HPI. All other systems reviewed and are negative.   Past Medical History:  Diagnosis Date   Anemia    Anxiety    Arthritis    Bladder stone    Cancer (HCC) 2007   prostate CA   COPD (chronic obstructive pulmonary disease) (HCC)    Decreased pulse 08/16/2008   LE doppler - bilateral ABIs normal, bilateral PVRs normal waveform; bilateral common iliac arteries difficult to visualize however velocities suggest less than 50% diameter reduction; bilateral LE normal velocities w/o evidence of diameter reduction    Depression    GERD (gastroesophageal reflux disease)    Hypercholesteremia    Hypertension    Mental disorder    PTSD   Pre-diabetes    Shortness of breath    Sleep apnea    wears CPAP nightly, settings at 14    Tobacco abuse    Urinary incontinence    due to prostate removal   Past Surgical History:  Procedure Laterality Date   ANTERIOR LAT LUMBAR FUSION N/A 11/17/2012   Procedure: XLIF L3 - L4 POSTERIOR L3 -L4 FUSION INSTRUMENTATION/L2 - L3 DECOMPRESSION (REVISION OF POSTERIOR HARDWARE) 1 LEVEL;  Surgeon: Venita Lick, MD;  Location: [facility];  Service: Orthopedics;  Laterality: N/A;  procedure #1: start 0923, end 1057.    BACK SURGERY  2010 / 2014   BLADDER SUSPENSION     x 2   CARDIAC CATHETERIZATION     no PCI   CARDIOVASCULAR STRESS TEST  08/16/2008   EF 57%; inferior region has diaphragmatic attenuation, otherwise normal perfusion w/o evidence of ischemia or infarct; LV normal in size; no scintographic evidence of inducible ischemia; no ECG changes, negative for ischemia; low risk scan   CERVICAL FUSION     COLON RESECTION N/A 07/12/2013   peritoneal biopsy only, evidence of metastatic prostate cancer   COLONOSCOPY     COLONOSCOPY N/A 05/27/2013   RMR: Extrinsic mass effect at the level of the appendiceal orifice likely represents an appendiceal or periappendiceal process. Neovascular changes of the rectal mucosa consistent with prior history of radiation treatment. Multiple 3-5 mm polyps  removed, several tubular adenomas. Diverticulosis.nex tcs 2019   COLONOSCOPY WITH PROPOFOL N/A 07/05/2018   Procedure: COLONOSCOPY WITH PROPOFOL;  Surgeon: Corbin Ade, MD;  Location: [facility];  Service: Endoscopy;  Laterality: N/A;  9:30am   COLONOSCOPY WITH PROPOFOL N/A 03/07/2021   Procedure: COLONOSCOPY WITH PROPOFOL;  Surgeon: Corbin Ade, MD;  Location: [facility];  Service: Endoscopy;  Laterality: N/A;  2:30pm   CYSTOSCOPY W/ URETERAL STENT PLACEMENT N/A 10/03/2013    Procedure: CYSTOSCOPY AND REMOVAL OF BLADDER NECK STONE ;  Surgeon: Crecencio Mc, MD;  Location: [facility];  Service: Urology;  Laterality: N/A;  WITH REMOVAL OF BLADDER NECK STONE     ESOPHAGOGASTRODUODENOSCOPY (EGD) WITH ESOPHAGEAL DILATION N/A 05/27/2013   RMR: Mild erosive reflux esophagitis with soft noncritical appearing stricture status post Maloney dilation, antral erosions with reactive gastropathy but no H. pylori   ESOPHAGOGASTRODUODENOSCOPY (EGD) WITH PROPOFOL N/A 07/05/2018   Procedure: ESOPHAGOGASTRODUODENOSCOPY (EGD) WITH PROPOFOL;  Surgeon: Corbin Ade, MD;  Location: [facility];  Service: Endoscopy;  Laterality: N/A;   ESOPHAGOGASTRODUODENOSCOPY (EGD) WITH PROPOFOL N/A 02/05/2023   Procedure: ESOPHAGOGASTRODUODENOSCOPY (EGD) WITH PROPOFOL;  Surgeon: Corbin Ade, MD;  Location: [facility];  Service: Endoscopy;  Laterality: N/A;  11:30 am, asa 3   HOLMIUM LASER APPLICATION N/A 10/03/2013   Procedure: HOLMIUM LASER APPLICATION;  Surgeon: Crecencio Mc, MD;  Location: [facility];  Service: Urology;  Laterality: N/A;   IR FLUORO GUIDE CV LINE RIGHT  10/08/2023   IR REMOVAL TUN CV CATH W/O FL  11/13/2023   IR US GUIDE VASC ACCESS RIGHT  10/08/2023   MALONEY DILATION N/A 07/05/2018   Procedure: Elease Hashimoto DILATION;  Surgeon: Corbin Ade, MD;  [facility]: [facility];  Service: Endoscopy;  Laterality: N/A;   MALONEY DILATION N/A 02/05/2023   Procedure: Elease Hashimoto DILATION;  Surgeon: Corbin Ade, MD;  Location: [facility];  Service: Endoscopy;  Laterality: N/A;   POLYPECTOMY  07/05/2018   Procedure: POLYPECTOMY;  Surgeon: Corbin Ade, MD;  Location: [facility];  Service: Endoscopy;;  cecal polyp , splenic flexure polyps x4   POLYPECTOMY  03/07/2021   Procedure: POLYPECTOMY;  Surgeon: Corbin Ade, MD;  Location: [facility];  Service: Endoscopy;;   PROSTATE SURGERY     TRANSTHORACIC ECHOCARDIOGRAM  08/16/2008   essentially normal for age   URINARY SPHINCTER IMPLANT N/A  01/31/2014   Procedure: IMPLANTATION ARTIFICIAL SPHINCTER  WITH CYSTOSCOPY;  Surgeon: Martina Sinner, MD;  Location: [facility];  Service: Urology;  Laterality: N/A;   WRIST SURGERY Right     Social History:  reports that he quit smoking about 10 years ago. His smoking use included cigarettes. He started smoking about 53 years ago. He has a 10.8 pack-year smoking history. He quit smokeless tobacco use about 43 years ago. He reports that he does not drink alcohol and does not use drugs.   Allergies  Allergen Reactions   Penicillins Anaphylaxis         Family History  Problem Relation Age of Onset   Diabetes Mother    Hypertension Mother    Heart disease Mother    Diabetes Father    Hypertension Father    Colon polyps Father        age 56   Colon polyps Sister    Hypertension Brother    Diabetes Brother    Cancer Paternal Uncle        unknown type   Prostate  cancer Cousin 32       maternal first cousin, metastatic   Colon cancer Cousin        paternal first cousin     Prior to Admission medications   Medication Sig Start Date End Date Taking? Authorizing Provider  ARIPiprazole (ABILIFY) 10 MG tablet Take 10 mg by mouth at bedtime. 04/13/18  Yes [provider]  Choline Fenofibrate 135 MG capsule Take 135 mg by mouth at bedtime. trilipix   Yes [provider]  Coenzyme Q10 (CO Q 10) 100 MG CAPS Take 100 mg by mouth every evening.   Yes [provider]  desipramine (NOPRAMIN) 100 MG tablet Take 100 mg by mouth at bedtime.  09/04/14  Yes [provider]  enalapril (VASOTEC) 10 MG tablet Take 10 mg by mouth every morning.    Yes [provider]  ferrous sulfate 325 (65 FE) MG tablet Take 325 mg by mouth daily.  06/30/14  Yes [provider]  furosemide (LASIX) 20 MG tablet Take 20 mg by mouth daily. 11/30/23  Yes [provider]  hydroxychloroquine (PLAQUENIL) 200 MG tablet Take 200 mg by mouth 2 (two) times daily.   Yes  [provider]  Boris Lown Oil 300 MG CAPS Take 300 mg by mouth daily.   Yes [provider]  meloxicam (MOBIC) 7.5 MG tablet Take 7.5 mg by mouth 2 (two) times daily. 04/18/19  Yes [provider]  Multiple Vitamin (MULTIVITAMIN WITH MINERALS) TABS tablet Take 1 tablet by mouth daily.   Yes [provider]  pantoprazole (PROTONIX) 40 MG tablet Take 1 tablet (40 mg total) by mouth daily. 30 minutes before breakfast 12/29/22  Yes Mahon, Courtney L, NP  rosuvastatin (CRESTOR) 40 MG tablet TAKE 1 TABLET DAILY 01/30/23  Yes Croitoru, Mihai, MD  venlafaxine XR (EFFEXOR-XR) 75 MG 24 hr capsule Take 225 mg by mouth daily. 10/03/14  Yes [provider]  XTANDI 40 MG tablet Take 80 mg by mouth 2 (two) times daily. 09/28/23   [provider]    Physical Exam: BP 115/74   Pulse 91   Temp 97.8 F (36.6 C) (Oral)   Resp (!) 24   Ht 6\' 2"  (1.88 m)   Wt 110.4 kg   SpO2 95%   BMI 31.25 kg/m   General: 68 y.o. year-old male well developed well nourished in no acute distress.  Alert and oriented x3. HEENT: NCAT, EOMI Neck: Supple, trachea medial Cardiovascular: Regular rate and rhythm with no rubs or gallops.  No thyromegaly or JVD noted.  No lower extremity edema. 2/4 pulses in all 4 extremities. Respiratory: Clear to auscultation with no wheezes or rales. Good inspiratory effort. Abdomen: Soft, nontender nondistended with normal bowel sounds x4 quadrants. Muskuloskeletal: No cyanosis, clubbing or edema noted bilaterally Neuro: CN II-XII intact, strength 5/5 x 4, sensation, reflexes intact Skin: No ulcerative lesions noted or rashes Psychiatry: Judgement and insight appear normal. Mood is appropriate for condition and setting          Labs on Admission:  Basic Metabolic Panel: Recent Labs  Lab 12/01/23 1917  NA 138  K 4.2  CL 102  CO2 22  GLUCOSE 102*  BUN 17  CREATININE 0.70  CALCIUM 9.5  MG 2.1   Liver Function Tests: Recent Labs  Lab  12/01/23 1917  AST 24  ALT 13  ALKPHOS 60  BILITOT 0.5  PROT 7.8  ALBUMIN 3.5   No results for input(s): "LIPASE", "AMYLASE" in the  last 168 hours. No results for input(s): "AMMONIA" in the last 168 hours. CBC: Recent Labs  Lab 12/01/23 1917  WBC 7.0  NEUTROABS 4.9  HGB 10.1*  HCT 32.2*  MCV 101.9*  PLT 310   Cardiac Enzymes: No results for input(s): "CKTOTAL", "CKMB", "CKMBINDEX", "TROPONINI" in the last 168 hours.  BNP (last 3 results) No results for input(s): "BNP" in the last 8760 hours.  ProBNP (last 3 results) No results for input(s): "PROBNP" in the last 8760 hours.  CBG: No results for input(s): "GLUCAP" in the last 168 hours.  Radiological Exams on Admission: DG Chest Portable 1 View Result Date: 12/01/2023 CLINICAL DATA:  Blood clot in lung EXAM: PORTABLE CHEST 1 VIEW COMPARISON:  CT 12/01/2023 11/23/2023 FINDINGS: Emphysema. New heterogeneous bilateral ground-glass opacities. No pleural effusion. Stable cardiomediastinal silhouette. No pneumothorax IMPRESSION: 1. New heterogeneous bilateral ground-glass opacities, referenced chest CT performed earlier today. 2. Emphysema. Electronically Signed   By: Jasmine Pang M.D.   On: 12/01/2023 21:51   CT ANGIO CHEST AORTA W/CM & OR WO/CM Result Date: 12/01/2023 CLINICAL DATA:  Dyspnea EXAM: CT ANGIOGRAPHY CHEST WITH CONTRAST TECHNIQUE: Multidetector CT imaging of the chest was performed using the standard protocol during bolus administration of intravenous contrast. Multiplanar CT image reconstructions and MIPs were obtained to evaluate the vascular anatomy. RADIATION DOSE REDUCTION: This exam was performed according to the departmental dose-optimization program which includes automated exposure control, adjustment of the mA and/or kV according to patient size and/or use of iterative reconstruction technique. CONTRAST:  75mL OMNIPAQUE IOHEXOL 350 MG/ML SOLN COMPARISON:  Nuclear medicine PET dated 09/07/2023 CT chest dated  10/04/2010 FINDINGS: Cardiovascular: Preferential opacification of the thoracic aorta. No evidence of thoracic aortic aneurysm or dissection. No pericardial effusion. Coronary artery calcifications and aortic atherosclerosis. Filling defect within the right lower lobe lateral segmental pulmonary artery (7:202). Normal heart size. Flattening of the interventricular septum. RV: LV ratio = 1. Mediastinum/Nodes: Imaged thyroid gland without nodules meeting criteria for imaging follow-up by size. Normal esophagus. No pathologically enlarged axillary, supraclavicular, mediastinal, or hilar lymph nodes. Lungs/Pleura: The central airways are patent. Increased bilateral central peribronchovascular consolidation and peribronchial wall thickening. New diffuse subpleural reticulations and peribronchovascular ground-glass opacities with architectural distortion. Upper lobe predominant paraseptal emphysema. No pneumothorax. No pleural effusion. Upper abdomen: Cholelithiasis. Musculoskeletal: No acute or abnormal lytic or blastic osseous lesions. Multilevel degenerative changes of the thoracic spine. Review of the MIP images confirms the above findings. IMPRESSION: 1. Pulmonary embolism within the right lower lobe lateral segmental pulmonary artery. 2. Flattening of the interventricular septum, which can be seen in the setting of right heart strain. Recommend correlation with echocardiography. 3. Increased bilateral central peribronchovascular consolidation and peribronchial wall thickening associated with new diffuse subpleural reticulations, peribronchovascular ground-glass opacities, and architectural distortion, which may be seen in the pneumonitis, secondary to immunotherapy. 4. Aortic Atherosclerosis (ICD10-I70.0) and Emphysema (ICD10-J43.9). Critical Value/emergent results were called by telephone at the time of interpretation on 12/01/2023 at 2:33 pm to provider [name] , who verbally acknowledged these results.  Electronically Signed   By: Agustin Cree M.D.   On: 12/01/2023 14:35    EKG: I independently viewed the EKG done and my findings are as followed: Normal sinus rhythm at rate of 93 bpm with prolonged QTc of 581 ms  Assessment/Plan Present on Admission:  Pulmonary embolism (HCC)  Essential hypertension  Mixed hyperlipidemia  GERD (gastroesophageal reflux disease)  Principal Problem:   Pulmonary embolism (HCC) Active Problems:   Essential hypertension  Mixed hyperlipidemia   GERD (gastroesophageal reflux disease)   Prolonged QT interval  Acute pulmonary embolism Patient presented with shortness of breath CT angiography chest with contrast showed pulmonary embolism within the right lower lobe lateral segmental pulmonary artery. Patient was started on IV heparin drip with plan to transition to DOAC in the morning Bilateral lower extremity ultrasound will be done in the morning Echocardiogram will be done in the morning  Prolonged QT interval QTc 581 ms Avoid QT prolonging drugs Magnesium level will be checked Repeat EKG in the morning  Essential hypertension BP meds will be held at this time due to soft BP  Mixed hyperlipidemia Continue Crestor, fenofibrate  GERD Continue Protonix  Metastatic prostate cancer Patient follows with Dr. Geanie Berlin Last office visit was 10/29/2023   DVT prophylaxis: Heparin drip  Code Status: Full code  Family Communication: None at bedside  Consults: None  Severity of Illness: The appropriate patient status for this patient is OBSERVATION. Observation status is judged to be reasonable and necessary in order to provide the required intensity of service to ensure the patient's safety. The patient's presenting symptoms, physical exam findings, and initial radiographic and laboratory data in the context of their medical condition is felt to place them at decreased risk for further clinical deterioration. Furthermore, it is anticipated that  the patient will be medically stable for discharge from the hospital within 2 midnights of admission.   Author: Frankey Shown, DO 12/01/2023 10:28 PM  For on call review [email or web address].

## 2023-12-01 NOTE — Progress Notes (Signed)
 Patient called reporting SHOB and cough X 3 weeks.  Denies any swelling to BLE, fever, or dizziness.  Patient reports that ambulating small distances through house and he has to rest.  RN encouraged patient to utilize ED for further evaluation to r/o PE or cardiac concerns.  Patient then report that he saw his PCP and they did a CXR and will be scheduled for CT [place] on 4/8.  RN reviewed CXR showing new findings for CHF.  RN educated patient to reach out to PCP for further recommendations on management of CHF.  RN notified Dr. Cherly Hensen, medical oncologist,  recommendations to hold [facility] and obtain Echo and follow up as scheduled on 4/24.  Dr. Cherly Hensen spoke with patient and provided these recommendations.

## 2023-12-01 NOTE — ED Provider Notes (Signed)
 [facility] AT [facility] Provider Note   CSN: [id number] Arrival date & time: 12/01/23  1625     History {Add pertinent medical, surgical, social history, OB history to HPI:1} Chief Complaint  Patient presents with   Pulmonary Embolism    [name] is a 68 y.o. male.  HPI Patient presents for shortness of breath.  Medical history includes HTN, HLD, GERD, depression, COPD, anemia, anxiety, sleep apnea, DM, and metastatic prostate cancer.  Current cancer therapy and his ADT with enzalutamide and sipuleucel-T.  For the past 3 weeks, he has had cough and shortness of breath, worsened with exertion.  He underwent outpatient PE study earlier today.  Results showed PE within right lower lobe and lateral segmental pulmonary artery.  Patient denies any current or recent chest pain.  He endorses some current, mild shortness of breath.    Home Medications Prior to Admission medications   Medication Sig Start Date End Date Taking? Authorizing Provider  ARIPiprazole (ABILIFY) 10 MG tablet Take 10 mg by mouth at bedtime. 04/13/18   [provider]  Choline Fenofibrate 135 MG capsule Take 135 mg by mouth at bedtime. trilipix    [provider]  Coenzyme Q10 (CO Q 10) 100 MG CAPS Take 100 mg by mouth every evening.    [provider]  desipramine (NOPRAMIN) 100 MG tablet Take 100 mg by mouth at bedtime.  09/04/14   [provider]  enalapril (VASOTEC) 10 MG tablet Take 10 mg by mouth every morning.     [provider]  enzalutamide [name]) 40 MG capsule Take 4 capsules (160 mg total) by mouth daily. Takes 4 capsules daily 10/08/23   [name], MD  ferrous sulfate 325 (65 FE) MG tablet Take 325 mg by mouth daily.  06/30/14   [provider]  hydroxychloroquine (PLAQUENIL) 200 MG tablet Take 200 mg by mouth 2 (two) times daily.    [provider]  Boris Lown Oil 300 MG CAPS Take 300 mg by mouth daily.     [provider]  meloxicam (MOBIC) 7.5 MG tablet Take 7.5 mg by mouth 2 (two) times daily. 04/18/19   [provider]  Multiple Vitamin (MULTIVITAMIN WITH MINERALS) TABS tablet Take 1 tablet by mouth daily.    [provider]  pantoprazole (PROTONIX) 40 MG tablet Take 1 tablet (40 mg total) by mouth daily. 30 minutes before breakfast 12/29/22   Aida Raider, NP  rosuvastatin (CRESTOR) 40 MG tablet TAKE 1 TABLET DAILY 01/30/23   Croitoru, Mihai, MD  venlafaxine XR (EFFEXOR-XR) 75 MG 24 hr capsule Take 225 mg by mouth daily. 10/03/14   [provider]      Allergies    Penicillins    Review of Systems   Review of Systems  Constitutional:  Positive for fatigue.  Respiratory:  Positive for shortness of breath.   All other systems reviewed and are negative.   Physical Exam Updated Vital Signs BP 105/66 (BP Location: Right Arm)   Pulse 92   Temp 98.8 F (37.1 C) (Oral)   Resp 20   Ht 6\' 2"  (1.88 m)   Wt 114.3 kg   SpO2 92%   BMI 32.35 kg/m  Physical Exam Vitals and nursing note reviewed.  Constitutional:      General: He is not in acute distress.    Appearance: Normal appearance. He is well-developed. He is not ill-appearing, toxic-appearing or diaphoretic.  HENT:  Head: Normocephalic and atraumatic.     Right Ear: External ear normal.     Left Ear: External ear normal.     Nose: Nose normal.     Mouth/Throat:     Mouth: Mucous membranes are moist.  Eyes:     Extraocular Movements: Extraocular movements intact.     Conjunctiva/sclera: Conjunctivae normal.  Cardiovascular:     Rate and Rhythm: Normal rate and regular rhythm.     Heart sounds: No murmur heard. Pulmonary:     Effort: Pulmonary effort is normal. No respiratory distress.     Breath sounds: Normal breath sounds. No wheezing, rhonchi or rales.  Chest:     Chest wall: No tenderness.  Abdominal:     General: There is no distension.     Palpations: Abdomen is soft.      Tenderness: There is no abdominal tenderness.  Musculoskeletal:        General: No swelling. Normal range of motion.     Cervical back: Normal range of motion and neck supple.  Skin:    General: Skin is warm and dry.     Coloration: Skin is not jaundiced or pale.  Neurological:     General: No focal deficit present.     Mental Status: He is alert and oriented to person, place, and time.  Psychiatric:        Mood and Affect: Mood normal.        Behavior: Behavior normal.     ED Results / Procedures / Treatments   Labs (all labs ordered are listed, but only abnormal results are displayed) Labs Reviewed  CBC WITH DIFFERENTIAL/PLATELET - Abnormal; Notable for the following components:      Result Value   RBC 3.16 (*)    Hemoglobin 10.1 (*)    HCT 32.2 (*)    MCV 101.9 (*)    Lymphs Abs 0.5 (*)    Eosinophils Absolute 0.7 (*)    Abs Immature Granulocytes 0.08 (*)    All other components within normal limits  COMPREHENSIVE METABOLIC PANEL WITH GFR  MAGNESIUM  PROTIME-INR  TROPONIN I (HIGH SENSITIVITY)    EKG None  Radiology CT ANGIO CHEST AORTA W/CM & OR WO/CM Result Date: 12/01/2023 CLINICAL DATA:  Dyspnea EXAM: CT ANGIOGRAPHY CHEST WITH CONTRAST TECHNIQUE: Multidetector CT imaging of the chest was performed using the standard protocol during bolus administration of intravenous contrast. Multiplanar CT image reconstructions and MIPs were obtained to evaluate the vascular anatomy. RADIATION DOSE REDUCTION: This exam was performed according to the departmental dose-optimization program which includes automated exposure control, adjustment of the mA and/or kV according to patient size and/or use of iterative reconstruction technique. CONTRAST:  75mL OMNIPAQUE IOHEXOL 350 MG/ML SOLN COMPARISON:  Nuclear medicine PET dated 09/07/2023 CT chest dated 10/04/2010 FINDINGS: Cardiovascular: Preferential opacification of the thoracic aorta. No evidence of thoracic aortic aneurysm or  dissection. No pericardial effusion. Coronary artery calcifications and aortic atherosclerosis. Filling defect within the right lower lobe lateral segmental pulmonary artery (7:202). Normal heart size. Flattening of the interventricular septum. RV: LV ratio = 1. Mediastinum/Nodes: Imaged thyroid gland without nodules meeting criteria for imaging follow-up by size. Normal esophagus. No pathologically enlarged axillary, supraclavicular, mediastinal, or hilar lymph nodes. Lungs/Pleura: The central airways are patent. Increased bilateral central peribronchovascular consolidation and peribronchial wall thickening. New diffuse subpleural reticulations and peribronchovascular ground-glass opacities with architectural distortion. Upper lobe predominant paraseptal emphysema. No pneumothorax. No pleural effusion. Upper abdomen: Cholelithiasis. Musculoskeletal: No acute or abnormal lytic  or blastic osseous lesions. Multilevel degenerative changes of the thoracic spine. Review of the MIP images confirms the above findings. IMPRESSION: 1. Pulmonary embolism within the right lower lobe lateral segmental pulmonary artery. 2. Flattening of the interventricular septum, which can be seen in the setting of right heart strain. Recommend correlation with echocardiography. 3. Increased bilateral central peribronchovascular consolidation and peribronchial wall thickening associated with new diffuse subpleural reticulations, peribronchovascular ground-glass opacities, and architectural distortion, which may be seen in the pneumonitis, secondary to immunotherapy. 4. Aortic Atherosclerosis (ICD10-I70.0) and Emphysema (ICD10-J43.9). Critical Value/emergent results were called by telephone at the time of interpretation on 12/01/2023 at 2:33 pm to provider [name] , who verbally acknowledged these results. Electronically Signed   By: Agustin Cree M.D.   On: 12/01/2023 14:35    Procedures Procedures  {Document cardiac monitor, telemetry  assessment procedure when appropriate:1}  Medications Ordered in ED Medications  heparin ADULT infusion 100 units/mL (25000 units/216mL) (1,800 Units/hr Intravenous New Bag/Given 12/01/23 1941)  heparin bolus via infusion 6,400 Units (6,400 Units Intravenous Bolus from Bag 12/01/23 1941)    ED Course/ Medical Decision Making/ A&P   {   Click here for ABCD2, HEART and other calculatorsREFRESH Note before signing :1}                              Medical Decision Making Amount and/or Complexity of Data Reviewed Labs: ordered. Radiology: ordered.  Risk Prescription drug management.   This patient presents to the ED for concern of shortness of breath, this involves an extensive number of treatment options, and is a complaint that carries with it a high risk of complications and morbidity.  The differential diagnosis includes PE, COPD exacerbation, pneumonia, CHF, anemia, acidosis   Co morbidities that complicate the patient evaluation  HTN, HLD, GERD, depression, COPD, anemia, anxiety, sleep apnea, DM, and metastatic prostate cancer   Additional history obtained:  Additional history obtained from N/A External records from outside source obtained and reviewed including EMR   Lab Tests:  I Ordered, and personally interpreted labs.  The pertinent results include: Baseline anemia, no leukocytosis,**   Imaging Studies ordered:  I reviewed outpatient imaging from earlier today, results of which showed right lower lobe PE. I agree with the radiologist interpretation   Cardiac Monitoring: / EKG:  The patient was maintained on a cardiac monitor.  I personally viewed and interpreted the cardiac monitored which showed an underlying rhythm of: Sinus rhythm   Problem List / ED Course / Critical interventions / Medication management  Patient presents for abnormal outpatient imaging study results.  He has had ongoing shortness of breath, worse with exertion for the past 3 weeks.  CTA PE  study earlier today showed right lower lobe PE.  He was sent to the ED for further evaluation.  On arrival, his vital signs are normal.  SpO2 is in the low 90s on room air.  Currently, he has unlabored work of breathing.  He denies any pleuritic chest pain.  He is able to speak in complete sentences.  Lab work was ordered.  Heparin was started.  Patient was admitted for further management. I ordered medication including heparin for PE Reevaluation of the patient after these medicines showed that the patient stayed the same I have reviewed the patients home medicines and have made adjustments as needed   Social Determinants of Health:  Has access to outpatient care  CRITICAL CARE Performed by:  Bralin Garry   Total critical care time: 32 minutes  Critical care time was exclusive of separately billable procedures and treating other patients.  Critical care was necessary to treat or prevent imminent or life-threatening deterioration.  Critical care was time spent personally by me on the following activities: development of treatment plan with patient and/or surrogate as well as nursing, discussions with consultants, evaluation of patient's response to treatment, examination of patient, obtaining history from patient or surrogate, ordering and performing treatments and interventions, ordering and review of laboratory studies, ordering and review of radiographic studies, pulse oximetry and re-evaluation of patient's condition.   {Document critical care time when appropriate:1} {Document review of labs and clinical decision tools ie heart score, Chads2Vasc2 etc:1}  {Document your independent review of radiology images, and any outside records:1} {Document your discussion with family members, caretakers, and with consultants:1} {Document social determinants of health affecting pt's care:1} {Document your decision making why or why not admission, treatments were needed:1} Final Clinical  Impression(s) / ED Diagnoses Final diagnoses:  Acute pulmonary embolism, unspecified pulmonary embolism type, unspecified whether acute cor pulmonale present (HCC)    Rx / DC Orders ED Discharge Orders     None

## 2023-12-02 ENCOUNTER — Observation Stay (HOSPITAL_BASED_OUTPATIENT_CLINIC_OR_DEPARTMENT_OTHER)

## 2023-12-02 ENCOUNTER — Observation Stay (HOSPITAL_COMMUNITY)

## 2023-12-02 ENCOUNTER — Other Ambulatory Visit (HOSPITAL_COMMUNITY): Payer: Self-pay

## 2023-12-02 ENCOUNTER — Encounter (HOSPITAL_COMMUNITY): Payer: Self-pay | Admitting: Internal Medicine

## 2023-12-02 ENCOUNTER — Other Ambulatory Visit (HOSPITAL_COMMUNITY): Payer: Self-pay | Admitting: *Deleted

## 2023-12-02 ENCOUNTER — Telehealth (HOSPITAL_COMMUNITY): Payer: Self-pay | Admitting: Pharmacy Technician

## 2023-12-02 DIAGNOSIS — R9431 Abnormal electrocardiogram [ECG] [EKG]: Secondary | ICD-10-CM | POA: Diagnosis not present

## 2023-12-02 DIAGNOSIS — I2609 Other pulmonary embolism with acute cor pulmonale: Secondary | ICD-10-CM | POA: Diagnosis not present

## 2023-12-02 DIAGNOSIS — I2699 Other pulmonary embolism without acute cor pulmonale: Secondary | ICD-10-CM | POA: Diagnosis not present

## 2023-12-02 DIAGNOSIS — I10 Essential (primary) hypertension: Secondary | ICD-10-CM | POA: Diagnosis not present

## 2023-12-02 DIAGNOSIS — K219 Gastro-esophageal reflux disease without esophagitis: Secondary | ICD-10-CM | POA: Diagnosis not present

## 2023-12-02 LAB — BASIC METABOLIC PANEL WITH GFR
Anion gap: 10 (ref 5–15)
BUN: 17 mg/dL (ref 8–23)
CO2: 22 mmol/L (ref 22–32)
Calcium: 8.8 mg/dL — ABNORMAL LOW (ref 8.9–10.3)
Chloride: 103 mmol/L (ref 98–111)
Creatinine, Ser: 0.61 mg/dL (ref 0.61–1.24)
GFR, Estimated: >60 mL/min (ref >60)
Glucose, Bld: 125 mg/dL — ABNORMAL HIGH (ref 70–99)
Potassium: 3.7 mmol/L (ref 3.5–5.1)
Sodium: 135 mmol/L (ref 135–145)

## 2023-12-02 LAB — HEPARIN LEVEL (UNFRACTIONATED): Heparin Unfractionated: 0.29 [IU]/mL — ABNORMAL LOW (ref 0.30–0.70)

## 2023-12-02 LAB — CBC
HCT: 28.8 % — ABNORMAL LOW (ref 39.0–52.0)
Hemoglobin: 9.4 g/dL — ABNORMAL LOW (ref 13.0–17.0)
MCH: 33.2 pg (ref 26.0–34.0)
MCHC: 32.6 g/dL (ref 30.0–36.0)
MCV: 101.8 fL — ABNORMAL HIGH (ref 80.0–100.0)
Platelets: 279 10*3/uL (ref 150–400)
RBC: 2.83 MIL/uL — ABNORMAL LOW (ref 4.22–5.81)
RDW: 12.9 % (ref 11.5–15.5)
WBC: 6.6 10*3/uL (ref 4.0–10.5)
nRBC: 0 % (ref 0.0–0.2)

## 2023-12-02 LAB — HIV ANTIBODY (ROUTINE TESTING W REFLEX): HIV Screen 4th Generation wRfx: NONREACTIVE

## 2023-12-02 LAB — ECHOCARDIOGRAM COMPLETE
Area-P 1/2: 2.69 cm2
Height: 74 in
S' Lateral: 2.5 cm
Weight: 3894.21 [oz_av]

## 2023-12-02 LAB — MAGNESIUM: Magnesium: 2 mg/dL (ref 1.7–2.4)

## 2023-12-02 LAB — PHOSPHORUS: Phosphorus: 3.5 mg/dL (ref 2.5–4.6)

## 2023-12-02 LAB — MRSA NEXT GEN BY PCR, NASAL: MRSA by PCR Next Gen: NOT DETECTED

## 2023-12-02 MED ORDER — APIXABAN 5 MG PO TABS: ORAL_TABLET | ORAL | 2 refills | Status: DC

## 2023-12-02 MED ORDER — GUAIFENESIN-DM 100-10 MG/5ML PO SYRP
10.0000 mL | ORAL_SOLUTION | ORAL | Status: DC | PRN
  Administered 2023-12-02: 10 mL via ORAL
  Filled 2023-12-02: qty 10

## 2023-12-02 MED ORDER — APIXABAN 5 MG PO TABS
10.0000 mg | ORAL_TABLET | Freq: Two times a day (BID) | ORAL | Status: DC
Start: 2023-12-02 — End: 2023-12-02
  Administered 2023-12-02: 10 mg via ORAL
  Filled 2023-12-02: qty 2

## 2023-12-02 MED ORDER — PERFLUTREN LIPID MICROSPHERE
1.0000 mL | INTRAVENOUS | Status: AC | PRN
  Administered 2023-12-02: 3 mL via INTRAVENOUS

## 2023-12-02 MED ORDER — APIXABAN 5 MG PO TABS
5.0000 mg | ORAL_TABLET | Freq: Two times a day (BID) | ORAL | Status: DC
Start: 2023-12-09 — End: 2023-12-02

## 2023-12-02 NOTE — Discharge Instructions (Addendum)
 Information on my medicine - ELIQUIS (apixaban)  This medication education was reviewed with me or my healthcare representative as part of my discharge preparation.     Why was Eliquis prescribed for you? Eliquis was prescribed to treat blood clots that may have been found in the veins of your legs (deep vein thrombosis) or in your lungs (pulmonary embolism) and to reduce the risk of them occurring again.  What do You need to know about Eliquis ? The starting dose is 10 mg (two 5 mg tablets) taken TWICE daily for the FIRST SEVEN (7) DAYS, then on 12/09/23 the dose is reduced to ONE 5 mg tablet taken TWICE daily.  Eliquis may be taken with or without food.   Try to take the dose about the same time in the morning and in the evening. If you have difficulty swallowing the tablet whole please discuss with your pharmacist how to take the medication safely.  Take Eliquis exactly as prescribed and DO NOT stop taking Eliquis without talking to the doctor who prescribed the medication.  Stopping may increase your risk of developing a new blood clot.  Refill your prescription before you run out.  After discharge, you should have regular check-up appointments with your healthcare provider that is prescribing your Eliquis.    What do you do if you miss a dose? If a dose of ELIQUIS is not taken at the scheduled time, take it as soon as possible on the same day and twice-daily administration should be resumed. The dose should not be doubled to make up for a missed dose.  Important Safety Information A possible side effect of Eliquis is bleeding. You should call your healthcare provider right away if you experience any of the following: Bleeding from an injury or your nose that does not stop. Unusual colored urine (red or dark brown) or unusual colored stools (red or black). Unusual bruising for unknown reasons. A serious fall or if you hit your head (even if there is no bleeding).  Some  medicines may interact with Eliquis and might increase your risk of bleeding or clotting while on Eliquis. To help avoid this, consult your healthcare provider or pharmacist prior to using any new prescription or non-prescription medications, including herbals, vitamins, non-steroidal anti-inflammatory drugs (NSAIDs) and supplements.  This website has more information on Eliquis (apixaban): [email or web address]     IMPORTANT INFORMATION: PAY CLOSE ATTENTION   PHYSICIAN DISCHARGE INSTRUCTIONS  Follow with Primary care provider  [name], MD  and other consultants as instructed by your Hospitalist Physician  SEEK MEDICAL CARE OR RETURN TO EMERGENCY ROOM IF SYMPTOMS COME BACK, WORSEN OR NEW PROBLEM DEVELOPS   Please note: You were cared for by a hospitalist during your hospital stay. Every effort will be made to forward records to your primary care provider.  You can request that your primary care provider send for your hospital records if they have not received them.  Once you are discharged, your primary care physician will handle any further medical issues. Please note that NO REFILLS for any discharge medications will be authorized once you are discharged, as it is imperative that you return to your primary care physician (or establish a relationship with a primary care physician if you do not have one) for your post hospital discharge needs so that they can reassess your need for medications and monitor your lab values.  Please get a complete blood count and chemistry panel checked by your Primary MD at your next visit,  and again as instructed by your Primary MD.  Get Medicines reviewed and adjusted: Please take all your medications with you for your next visit with your Primary MD  Laboratory/radiological data: Please request your Primary MD to go over all hospital tests and procedure/radiological results at the follow up, please ask your primary care provider to  get all Hospital records sent to his/her office.  In some cases, they will be blood work, cultures and biopsy results pending at the time of your discharge. Please request that your primary care provider follow up on these results.  If you are diabetic, please bring your blood sugar readings with you to your follow up appointment with primary care.    Please call and make your follow up appointments as soon as possible.    Also Note the following: If you experience worsening of your admission symptoms, develop shortness of breath, life threatening emergency, suicidal or homicidal thoughts you must seek medical attention immediately by calling 911 or calling your MD immediately  if symptoms less severe.  You must read complete instructions/literature along with all the possible adverse reactions/side effects for all the Medicines you take and that have been prescribed to you. Take any new Medicines after you have completely understood and accpet all the possible adverse reactions/side effects.   Do not drive when taking Pain medications or sleeping medications (Benzodiazepines)  Do not take more than prescribed Pain, Sleep and Anxiety Medications. It is not advisable to combine anxiety,sleep and pain medications without talking with your primary care practitioner  Special Instructions: If you have smoked or chewed Tobacco  in the last 2 yrs please stop smoking, stop any regular Alcohol  and or any Recreational drug use.  Wear Seat belts while driving.  Do not drive if taking any narcotic, mind altering or controlled substances or recreational drugs or alcohol.

## 2023-12-02 NOTE — Consult Note (Signed)
 PHARMACY - ANTICOAGULATION CONSULT NOTE  Pharmacy Consult for Heparin >> apixaban Indication: pulmonary embolus  Allergies  Allergen Reactions   Penicillins Anaphylaxis        Patient Measurements: Height: 6\' 2"  (188 cm) Weight: 110.4 kg (243 lb 6.2 oz) IBW/kg (Calculated) : 82.2 HEPARIN DW (KG): 105  Vital Signs: Temp: 97.9 F (36.6 C) (04/09 0751) Temp Source: Axillary (04/09 0751) BP: 107/61 (04/09 0700) Pulse Rate: 86 (04/09 0730)  Labs: Recent Labs    12/01/23 1917 12/01/23 2104 12/02/23 0158  HGB 10.1*  --  9.4*  HCT 32.2*  --  28.8*  PLT 310  --  279  LABPROT 14.1  --   --   INR 1.1  --   --   HEPARINUNFRC  --   --  0.29*  CREATININE 0.70 0.69 0.61  TROPONINIHS 4 4  --     Estimated Creatinine Clearance: 116.9 mL/min (by C-G formula based on SCr of 0.61 mg/dL).  Medical History: Past Medical History:  Diagnosis Date   Anemia    Anxiety    Arthritis    Bladder stone    Cancer (HCC) 2007   prostate CA   COPD (chronic obstructive pulmonary disease) (HCC)    Decreased pulse 08/16/2008   LE doppler - bilateral ABIs normal, bilateral PVRs normal waveform; bilateral common iliac arteries difficult to visualize however velocities suggest less than 50% diameter reduction; bilateral LE normal velocities w/o evidence of diameter reduction   Depression    GERD (gastroesophageal reflux disease)    Hypercholesteremia    Hypertension    Mental disorder    PTSD   Pre-diabetes    Shortness of breath    Sleep apnea    wears CPAP nightly, settings at 14    Tobacco abuse    Urinary incontinence    due to prostate removal   Medications:  No PTA anticoagulation per chart review  Baseline Labs Hgb 10.1 Plt 310 INR/PT - pending   Assessment: RR is a 68 yo male who presented to the ED c/o PE in his lung. They received a phone call today to seek treatment in the ED due to the results of an outpatient CT scan. They've had a history significant for SOB and  cough over the last 3 weeks. They have to rest after ambulating over short distances for rest. They have a past medical history significant for prostate cancer. Pharmacy has been consulted to manage this patient's heparin.  4/9: now transitioning from heparin to Eliquis  Goal of Therapy:  Heparin level 0.3-0.7 units/ml Monitor platelets by anticoagulation protocol: Yes   Plan:  Stop heparin infusion Start apixaban 10 mg twice daily x 7 days followed by apixaban 5 mg twice daily  Judeth Cornfield, PharmD Clinical Pharmacist 12/02/2023 7:59 AM

## 2023-12-02 NOTE — Telephone Encounter (Signed)
 Patient Product/process development scientist completed.    The patient is insured through [employer]. Patient has Medicare and is not eligible for a copay card, but may be able to apply for patient assistance or Medicare RX Payment Plan (Patient Must reach out to their plan, if eligible for payment plan), if available.    Ran test claim for Eliquis 5 mg and the current 30 day co-pay is $0.00.   This test claim was processed through [facility]- copay amounts may vary at other pharmacies due to pharmacy/plan contracts, or as the patient moves through the different stages of their insurance plan.     Roland Earl, CPHT Pharmacy Technician III Certified Patient Advocate [facility] Patient Advocate Team Direct Number: [phone]  Fax: [phone]

## 2023-12-02 NOTE — Discharge Summary (Signed)
 Physician Discharge Summary  [name]:[id number] DOB: 1956/06/12 DOA: 12/01/2023  PCP: [name], MD  Admit date: 12/01/2023 Discharge date: 12/02/2023  Admitted From:  HOME  Disposition: HOME   Recommendations for Outpatient Follow-up:  Follow up with PCP in 1 weeks Please check CBC in 1 week Bleeding precautions while on anticoagulation therapy Apixaban instructions: 10 mg BID thru 12/08/23 then 5 mg BID   Discharge Condition: STABLE   CODE STATUS: FULL DIET: Heart Healthy    Brief Hospitalization Summary: Please see all hospital notes, images, labs for full details of the hospitalization. Admission provider HPI:   68 y.o. male with medical history significant of hypertension, hyperlipidemia, GERD, metastatic prostate cancer on enzalutamide and sipuleucel-T (per ED medical record) who presents to the emergency department due to 3-week onset of cough and shortness of breath that worsens with exertion.  He had an outpatient CT angiography of chest which showed PE in the right lower lobe and lateral segmental pulmonary artery.  He was asked to go to the ED for further evaluation and management.  Patient denies any chest pain, but endorsed some shortness of breath.   ED Course:  In the emergency department, BP was soft at 105/66, but other vital signs were within normal range.  Workup in the ED showed macrocytic anemia,, BMP was normal except for blood glucose of 102.  Troponin x 1 - 4, magnesium 2.1.   CT angiography chest with contrast showed pulmonary embolism within the right lower lobe lateral segmental pulmonary artery.  Patient was started on heparin drip.  Hospitalist was asked to admit patient for further evaluation and management.    HOSPITAL COURSE BY PROBLEM LIST   Acute pulmonary embolism Patient presented with shortness of breath - NOW RESOLVED SYMPTOMS CT angiography chest with contrast showed pulmonary embolism within the right lower lobe lateral segmental  pulmonary artery. Patient was started on IV heparin drip with plan to transition to DOAC  Patient was transitioned to apixaban on 4/9: pharm D recommended 10 mg BID thru 4/15, then 5 mg BID Bilateral lower extremity ultrasound negative for findings of DVT Echocardiogram with no findings of heart strain.  LVEF 65-70% with grade 1 DD; mild RV dilatation with normal contraction.  Bleeding precautions while on anticoagulant therapy advised    Prolonged QT interval QTc 581 ms Avoid QT prolonging drugs recommended Follow up with PCP    Essential hypertension Resume home meds at DC    Mixed hyperlipidemia Continue Crestor, fenofibrate   GERD Continue Protonix   Metastatic prostate cancer Patient follows with Dr. Geanie Berlin Last office visit was 10/29/2023  Discharge Diagnoses:  Principal Problem:   Pulmonary embolism (HCC) Active Problems:   Essential hypertension   Mixed hyperlipidemia   GERD (gastroesophageal reflux disease)   Prolonged QT interval   Discharge Instructions:  Allergies as of 12/02/2023       Reactions   Penicillins Anaphylaxis           Medication List     STOP taking these medications    meloxicam 7.5 MG tablet Commonly known as: MOBIC   Xtandi 40 MG tablet Generic drug: enzalutamide       TAKE these medications    apixaban 5 MG Tabs tablet Commonly known as: ELIQUIS 2 po bid thru 4/15, then 1 po BID   ARIPiprazole 10 MG tablet Commonly known as: ABILIFY Take 10 mg by mouth at bedtime.   Choline Fenofibrate 135 MG capsule Take 135 mg by mouth  at bedtime. trilipix   Co Q 10 100 MG Caps Take 100 mg by mouth every evening.   desipramine 100 MG tablet Commonly known as: NOPRAMIN Take 100 mg by mouth at bedtime.   enalapril 10 MG tablet Commonly known as: VASOTEC Take 10 mg by mouth every morning.   ferrous sulfate 325 (65 FE) MG tablet Take 325 mg by mouth daily.   furosemide 20 MG tablet Commonly known as: LASIX Take 20  mg by mouth daily.   hydroxychloroquine 200 MG tablet Commonly known as: PLAQUENIL Take 200 mg by mouth 2 (two) times daily.   Krill Oil 300 MG Caps Take 300 mg by mouth daily.   multivitamin with minerals Tabs tablet Take 1 tablet by mouth daily.   pantoprazole 40 MG tablet Commonly known as: PROTONIX Take 1 tablet (40 mg total) by mouth daily. 30 minutes before breakfast   rosuvastatin 40 MG tablet Commonly known as: CRESTOR TAKE 1 TABLET DAILY   venlafaxine XR 75 MG 24 hr capsule Commonly known as: EFFEXOR-XR Take 225 mg by mouth daily.        Follow-up Information     [name], MD. Schedule an appointment as soon as possible for a visit in 1 week(s).   Specialty: Internal Medicine Why: Hospital Follow Up Contact information: [address] [place] [address] [phone]                Allergies  Allergen Reactions   Penicillins Anaphylaxis        Allergies as of 12/02/2023       Reactions   Penicillins Anaphylaxis           Medication List     STOP taking these medications    meloxicam 7.5 MG tablet Commonly known as: MOBIC   Xtandi 40 MG tablet Generic drug: enzalutamide       TAKE these medications    apixaban 5 MG Tabs tablet Commonly known as: ELIQUIS 2 po bid thru 4/15, then 1 po BID   ARIPiprazole 10 MG tablet Commonly known as: ABILIFY Take 10 mg by mouth at bedtime.   Choline Fenofibrate 135 MG capsule Take 135 mg by mouth at bedtime. trilipix   Co Q 10 100 MG Caps Take 100 mg by mouth every evening.   desipramine 100 MG tablet Commonly known as: NOPRAMIN Take 100 mg by mouth at bedtime.   enalapril 10 MG tablet Commonly known as: VASOTEC Take 10 mg by mouth every morning.   ferrous sulfate 325 (65 FE) MG tablet Take 325 mg by mouth daily.   furosemide 20 MG tablet Commonly known as: LASIX Take 20 mg by mouth daily.   hydroxychloroquine 200 MG tablet Commonly known as: PLAQUENIL Take  200 mg by mouth 2 (two) times daily.   Krill Oil 300 MG Caps Take 300 mg by mouth daily.   multivitamin with minerals Tabs tablet Take 1 tablet by mouth daily.   pantoprazole 40 MG tablet Commonly known as: PROTONIX Take 1 tablet (40 mg total) by mouth daily. 30 minutes before breakfast   rosuvastatin 40 MG tablet Commonly known as: CRESTOR TAKE 1 TABLET DAILY   venlafaxine XR 75 MG 24 hr capsule Commonly known as: EFFEXOR-XR Take 225 mg by mouth daily.        Procedures/Studies: ECHOCARDIOGRAM COMPLETE Result Date: 12/02/2023    ECHOCARDIOGRAM REPORT   Patient Name:   [name] Date of Exam: 12/02/2023 Medical Rec #:  [id number]  Height:       74.0 in Accession #:    [id number]     Weight:       243.4 lb Date of Birth:  02-12-1956      BSA:          2.363 m Patient Age:    67 years       BP:           106/68 mmHg Patient Gender: M              HR:           89 bpm. [facility] Procedure: 2D Echo, Cardiac Doppler, Color Doppler and Intracardiac            Opacification Agent (Both Spectral and Color Flow Doppler were            utilized during procedure). Indications:    Pulmonary Embolus I26.09  History:        Patient has prior history of Echocardiogram examinations, most                 recent 03/15/2021. COPD; Risk Factors:Hypertension, Diabetes and                 Dyslipidemia. Hx of smoking.  Sonographer:    Celesta Gentile RCS Referring Phys: [id number] OLADAPO ADEFESO IMPRESSIONS  1. Left ventricular ejection fraction, by estimation, is 65 to 70%. The left ventricle has normal function. The left ventricle has no regional wall motion abnormalities. There is mild concentric left ventricular hypertrophy. Left ventricular diastolic parameters are consistent with Grade I diastolic dysfunction (impaired relaxation).  2. Right ventricular systolic function is normal. The right ventricular size is mildly enlarged. Tricuspid regurgitation signal is inadequate for assessing PA  pressure.  3. The mitral valve is grossly normal. Trivial mitral valve regurgitation.  4. The aortic valve is tricuspid. Aortic valve regurgitation is not visualized.  5. Aortic dilatation noted. There is mild dilatation of the aortic root, measuring 40 mm.  6. The inferior vena cava is normal in size with greater than 50% respiratory variability, suggesting right atrial pressure of 3 mmHg. Comparison(s): Prior images reviewed side by side. LVEF vigorous at 65-70%. Mild RV dilatation with normal contraction. FINDINGS  Left Ventricle: Left ventricular ejection fraction, by estimation, is 65 to 70%. The left ventricle has normal function. The left ventricle has no regional wall motion abnormalities. Definity contrast agent was given IV to delineate the left ventricular  endocardial borders. The left ventricular internal cavity size was normal in size. There is mild concentric left ventricular hypertrophy. Left ventricular diastolic parameters are consistent with Grade I diastolic dysfunction (impaired relaxation). Right Ventricle: The right ventricular size is mildly enlarged. No increase in right ventricular wall thickness. Right ventricular systolic function is normal. Tricuspid regurgitation signal is inadequate for assessing PA pressure. Left Atrium: Left atrial size was normal in size. Right Atrium: Right atrial size was normal in size. Pericardium: There is no evidence of pericardial effusion. Presence of epicardial fat layer. Mitral Valve: The mitral valve is grossly normal. Trivial mitral valve regurgitation. Tricuspid Valve: The tricuspid valve is grossly normal. Tricuspid valve regurgitation is trivial. Aortic Valve: The aortic valve is tricuspid. There is mild aortic valve annular calcification. Aortic valve regurgitation is not visualized. Pulmonic Valve: The pulmonic valve was grossly normal. Pulmonic valve regurgitation is trivial. Aorta: Aortic dilatation noted. There is mild dilatation of the aortic  root, measuring 40 mm. Venous: The inferior vena  cava is normal in size with greater than 50% respiratory variability, suggesting right atrial pressure of 3 mmHg. IAS/Shunts: No atrial level shunt detected by color flow Doppler. Additional Comments: 3D was performed not requiring image post processing on an independent workstation and was indeterminate.  LEFT VENTRICLE PLAX 2D LVIDd:         4.70 cm   Diastology LVIDs:         2.50 cm   LV e' medial:    6.42 cm/s LV PW:         1.50 cm   LV E/e' medial:  12.9 LV IVS:        1.20 cm   LV e' lateral:   11.50 cm/s LVOT diam:     2.30 cm   LV E/e' lateral: 7.2 LV SV:         119 LV SV Index:   50 LVOT Area:     4.15 cm  RIGHT VENTRICLE RV S prime:     19.70 cm/s TAPSE (M-mode): 2.4 cm LEFT ATRIUM             Index        RIGHT ATRIUM           Index LA diam:        3.85 cm 1.63 cm/m   RA Area:     13.50 cm LA Vol (A2C):   43.0 ml 18.20 ml/m  RA Volume:   24.20 ml  10.24 ml/m LA Vol (A4C):   50.5 ml 21.37 ml/m LA Biplane Vol: 47.8 ml 20.23 ml/m  AORTIC VALVE LVOT Vmax:   131.00 cm/s LVOT Vmean:  83.500 cm/s LVOT VTI:    0.286 m  AORTA Ao Root diam: 4.00 cm MITRAL VALVE MV Area (PHT): 2.69 cm     SHUNTS MV Decel Time: 282 msec     Systemic VTI:  0.29 m MV E velocity: 82.70 cm/s   Systemic Diam: 2.30 cm MV A velocity: 114.00 cm/s MV E/A ratio:  0.73 Nona Dell MD Electronically signed by Nona Dell MD Signature Date/Time: 12/02/2023/3:17:15 PM    Final    US Venous Img Lower Bilateral (DVT) Result Date: 12/02/2023 CLINICAL DATA:  Pain and swelling EXAM: Bilateral lower Extremity Venous Doppler Ultrasound TECHNIQUE: Gray-scale sonography with compression, as well as color and duplex ultrasound, were performed to evaluate the deep venous system(s) from the level of the common femoral vein through the popliteal and proximal calf veins. COMPARISON:  None available FINDINGS: VENOUS Normal compressibility of the common femoral, superficial femoral, and  popliteal veins, as well as the visualized calf veins. Visualized portions of profunda femoral vein and great saphenous vein unremarkable. No filling defects to suggest DVT on grayscale or color Doppler imaging. Doppler waveforms show normal direction of venous flow, normal respiratory plasticity and response to augmentation. OTHER None. Limitations: none IMPRESSION: No  lower extremity DVT. Electronically Signed   By: Acquanetta Belling M.D.   On: 12/02/2023 09:42   DG Chest Portable 1 View Result Date: 12/01/2023 CLINICAL DATA:  Blood clot in lung EXAM: PORTABLE CHEST 1 VIEW COMPARISON:  CT 12/01/2023 11/23/2023 FINDINGS: Emphysema. New heterogeneous bilateral ground-glass opacities. No pleural effusion. Stable cardiomediastinal silhouette. No pneumothorax IMPRESSION: 1. New heterogeneous bilateral ground-glass opacities, referenced chest CT performed earlier today. 2. Emphysema. Electronically Signed   By: Jasmine Pang M.D.   On: 12/01/2023 21:51   CT ANGIO CHEST AORTA W/CM & OR WO/CM Result Date: 12/01/2023 CLINICAL DATA:  Dyspnea EXAM:  CT ANGIOGRAPHY CHEST WITH CONTRAST TECHNIQUE: Multidetector CT imaging of the chest was performed using the standard protocol during bolus administration of intravenous contrast. Multiplanar CT image reconstructions and MIPs were obtained to evaluate the vascular anatomy. RADIATION DOSE REDUCTION: This exam was performed according to the departmental dose-optimization program which includes automated exposure control, adjustment of the mA and/or kV according to patient size and/or use of iterative reconstruction technique. CONTRAST:  75mL OMNIPAQUE IOHEXOL 350 MG/ML SOLN COMPARISON:  Nuclear medicine PET dated 09/07/2023 CT chest dated 10/04/2010 FINDINGS: Cardiovascular: Preferential opacification of the thoracic aorta. No evidence of thoracic aortic aneurysm or dissection. No pericardial effusion. Coronary artery calcifications and aortic atherosclerosis. Filling defect within  the right lower lobe lateral segmental pulmonary artery (7:202). Normal heart size. Flattening of the interventricular septum. RV: LV ratio = 1. Mediastinum/Nodes: Imaged thyroid gland without nodules meeting criteria for imaging follow-up by size. Normal esophagus. No pathologically enlarged axillary, supraclavicular, mediastinal, or hilar lymph nodes. Lungs/Pleura: The central airways are patent. Increased bilateral central peribronchovascular consolidation and peribronchial wall thickening. New diffuse subpleural reticulations and peribronchovascular ground-glass opacities with architectural distortion. Upper lobe predominant paraseptal emphysema. No pneumothorax. No pleural effusion. Upper abdomen: Cholelithiasis. Musculoskeletal: No acute or abnormal lytic or blastic osseous lesions. Multilevel degenerative changes of the thoracic spine. Review of the MIP images confirms the above findings. IMPRESSION: 1. Pulmonary embolism within the right lower lobe lateral segmental pulmonary artery. 2. Flattening of the interventricular septum, which can be seen in the setting of right heart strain. Recommend correlation with echocardiography. 3. Increased bilateral central peribronchovascular consolidation and peribronchial wall thickening associated with new diffuse subpleural reticulations, peribronchovascular ground-glass opacities, and architectural distortion, which may be seen in the pneumonitis, secondary to immunotherapy. 4. Aortic Atherosclerosis (ICD10-I70.0) and Emphysema (ICD10-J43.9). Critical Value/emergent results were called by telephone at the time of interpretation on 12/01/2023 at 2:33 pm to provider [name] , who verbally acknowledged these results. Electronically Signed   By: Agustin Cree M.D.   On: 12/01/2023 14:35   DG Chest 2 View Result Date: 11/29/2023 CLINICAL DATA:  dyspnea EXAM: CHEST - 2 VIEW COMPARISON:  10/16/2023. FINDINGS: Cardiac silhouette is prominent. There is pulmonary  interstitial prominence with vascular congestion. Lungs are hyperinflated consistent with COPD. No focal consolidation. No pneumothorax or pleural effusion identified. IMPRESSION: Findings suggest CHF. Electronically Signed   By: Layla Maw M.D.   On: 11/29/2023 22:24   IR Removal Tun Cv Cath W/O FL Result Date: 11/13/2023 INDICATION: 68 year old male referred for removal of tunneled hemodialysis catheter EXAM: REMOVAL TUNNELED CENTRAL VENOUS CATHETER MEDICATIONS: None. ANESTHESIA/SEDATION: Moderate (conscious) sedation was not employed during this procedure. FLUOROSCOPY TIME:  None COMPLICATIONS: None PROCEDURE: Informed written consent was obtained from the patient after a thorough discussion of the procedural risks, benefits and alternatives. All questions were addressed. Maximal Sterile Barrier Technique was utilized including caps, mask, sterile gowns, sterile gloves, sterile drape, hand hygiene and skin antiseptic. A timeout was performed prior to the initiation of the procedure. The patient's right chest and catheter was prepped and draped in a normal sterile fashion. Heparin was removed from both ports of catheter. 1% lidocaine was used for local anesthesia. Using gentle blunt dissection the cuff of the catheter was exposed and the catheter was removed in it's entirety. Pressure was held till hemostasis was obtained. A sterile dressing was applied. The patient tolerated the procedure well with no immediate complications. IMPRESSION: Status post removal of tunneled hemodialysis catheter Signed, Yvone Neu. Loreta Ave, DO, ABVM, RPVI  [facility] Electronically Signed   By: Gilmer Mor D.O.   On: 11/13/2023 12:59     Subjective: Pt says he feels well today, no SOB, no CP, no cough.  No bleeding complications.   Discharge Exam: Vitals:   12/02/23 1523 12/02/23 1600  BP:  (!) 114/39  Pulse:  89  Resp:  (!) 33  Temp:    SpO2: 93% 91%    Vitals:   12/02/23 1222 12/02/23 1400 12/02/23 1523 12/02/23 1600  BP:  133/74  (!) 114/39  Pulse:  88  89  Resp:  (!) 32  (!) 33  Temp:      TempSrc:      SpO2: 94% 90% 93% 91%  Weight:      Height:        General: Pt is alert, awake, not in acute distress Cardiovascular: RRR, S1/S2 +, no rubs, no gallops Respiratory: CTA bilaterally, no wheezing, no rhonchi Abdominal: Soft, NT, ND, bowel sounds + Extremities: no edema, no cyanosis   The results of significant diagnostics from this hospitalization (including imaging, microbiology, ancillary and laboratory) are listed below for reference.     Microbiology: Recent Results (from the past 240 hours)  MRSA Next Gen by PCR, Nasal     Status: None   Collection Time: 12/01/23 10:10 PM   Specimen: Nasal Mucosa; Nasal Swab  Result Value Ref Range Status   MRSA by PCR Next Gen NOT DETECTED NOT DETECTED Final    Comment: (NOTE) The GeneXpert MRSA Assay (FDA approved for NASAL specimens only), is one component of a comprehensive MRSA colonization surveillance program. It is not intended to diagnose MRSA infection nor to guide or monitor treatment for MRSA infections. Test performance is not FDA approved in patients less than 64 years old. Performed at [facility], [address] [place], [place] [address]      Labs: BNP (last 3 results) No results for input(s): "BNP" in the last 8760 hours. Basic Metabolic Panel: Recent Labs  Lab 12/01/23 1917 12/01/23 2104 12/02/23 0158  NA 138 135 135  K 4.2 4.0 3.7  CL 102 100 103  CO2 22 23 22   GLUCOSE 102* 102* 125*  BUN 17 17 17   CREATININE 0.70 0.69 0.61  CALCIUM 9.5 9.6 8.8*  MG 2.1  --  2.0  PHOS  --   --  3.5   Liver Function Tests: Recent Labs  Lab 12/01/23 1917  AST 24  ALT 13  ALKPHOS 60  BILITOT 0.5  PROT 7.8  ALBUMIN 3.5   No results for input(s): "LIPASE", "AMYLASE" in the last 168 hours. No results for input(s): "AMMONIA" in the last 168  hours. CBC: Recent Labs  Lab 12/01/23 1917 12/02/23 0158  WBC 7.0 6.6  NEUTROABS 4.9  --   HGB 10.1* 9.4*  HCT 32.2* 28.8*  MCV 101.9* 101.8*  PLT 310 279   Cardiac Enzymes: No results for input(s): "CKTOTAL", "CKMB", "CKMBINDEX", "TROPONINI" in the last 168 hours. BNP: Invalid input(s): "POCBNP" CBG: No results for input(s): "GLUCAP" in the last 168 hours. D-Dimer No results for input(s): "DDIMER" in the last 72 hours. Hgb A1c No results for input(s): "HGBA1C" in the last 72 hours. Lipid Profile No results for input(s): "CHOL", "HDL", "LDLCALC", "TRIG", "CHOLHDL", "LDLDIRECT" in the last 72 hours. Thyroid function studies No results for input(s): "TSH", "T4TOTAL", "T3FREE", "THYROIDAB" in the last 72 hours.  Invalid input(s): "FREET3" Anemia work up No results for input(s): "VITAMINB12", "  FOLATE", "FERRITIN", "TIBC", "IRON", "RETICCTPCT" in the last 72 hours. Urinalysis    Component Value Date/Time   COLORURINE YELLOW 11/17/2009 1655   APPEARANCEUR HAZY (A) 11/17/2009 1655   LABSPEC 1.020 11/17/2009 1655   PHURINE 5.5 11/17/2009 1655   GLUCOSEU NEGATIVE 11/17/2009 1655   HGBUR NEGATIVE 11/17/2009 1655   BILIRUBINUR NEGATIVE 11/17/2009 1655   KETONESUR NEGATIVE 11/17/2009 1655   PROTEINUR NEGATIVE 11/17/2009 1655   UROBILINOGEN 0.2 11/17/2009 1655   NITRITE NEGATIVE 11/17/2009 1655   LEUKOCYTESUR  11/17/2009 1655    NEGATIVE MICROSCOPIC NOT DONE ON URINES WITH NEGATIVE PROTEIN, BLOOD, LEUKOCYTES, NITRITE, OR GLUCOSE <1000 mg/dL.   Sepsis Labs Recent Labs  Lab 12/01/23 1917 12/02/23 0158  WBC 7.0 6.6   Microbiology Recent Results (from the past 240 hours)  MRSA Next Gen by PCR, Nasal     Status: None   Collection Time: 12/01/23 10:10 PM   Specimen: Nasal Mucosa; Nasal Swab  Result Value Ref Range Status   MRSA by PCR Next Gen NOT DETECTED NOT DETECTED Final    Comment: (NOTE) The GeneXpert MRSA Assay (FDA approved for NASAL specimens only), is one  component of a comprehensive MRSA colonization surveillance program. It is not intended to diagnose MRSA infection nor to guide or monitor treatment for MRSA infections. Test performance is not FDA approved in patients less than 25 years old. Performed at [facility], [address] [place], [place] [address]    Time coordinating discharge:  40 mins  SIGNED:  Standley Dakins, MD  [facility] 12/02/2023, 4:36 PM How to contact the [facility] Attending or Consulting provider 7A - 7P or covering provider during after hours 7P -7A, for this patient?  Check the care team in [facility] and look for a) attending/consulting [facility] provider listed and b) the [facility] team listed Log into [email or web address] and use [place]'s universal password to access. If you do not have the password, please contact the hospital operator. Locate the [facility] provider you are looking for under [facility] and page to a number that you can be directly reached. If you still have difficulty reaching the provider, please page the [facility] (Director on Call) for the Hospitalists listed on amion for assistance.

## 2023-12-02 NOTE — Progress Notes (Signed)
*  PRELIMINARY RESULTS* Echocardiogram 2D Echocardiogram has been performed with Definity.  Stacey Drain 12/02/2023, 2:50 PM

## 2023-12-02 NOTE — Consult Note (Signed)
 PHARMACY - ANTICOAGULATION CONSULT NOTE  Pharmacy Consult for Heparin Indication: pulmonary embolus  Allergies  Allergen Reactions   Penicillins Anaphylaxis        Patient Measurements: Height: 6\' 2"  (188 cm) Weight: 110.4 kg (243 lb 6.2 oz) IBW/kg (Calculated) : 82.2 HEPARIN DW (KG): 105  Vital Signs: Temp: 98 F (36.7 C) (04/08 2300) Temp Source: Oral (04/08 2300) BP: 130/70 (04/09 0100) Pulse Rate: 88 (04/08 2300)  Labs: Recent Labs    12/01/23 1917 12/01/23 2104 12/02/23 0158  HGB 10.1*  --  9.4*  HCT 32.2*  --  28.8*  PLT 310  --  279  LABPROT 14.1  --   --   INR 1.1  --   --   HEPARINUNFRC  --   --  0.29*  CREATININE 0.70 0.69 0.61  TROPONINIHS 4 4  --     Estimated Creatinine Clearance: 116.9 mL/min (by C-G formula based on SCr of 0.61 mg/dL).  Medical History: Past Medical History:  Diagnosis Date   Anemia    Anxiety    Arthritis    Bladder stone    Cancer (HCC) 2007   prostate CA   COPD (chronic obstructive pulmonary disease) (HCC)    Decreased pulse 08/16/2008   LE doppler - bilateral ABIs normal, bilateral PVRs normal waveform; bilateral common iliac arteries difficult to visualize however velocities suggest less than 50% diameter reduction; bilateral LE normal velocities w/o evidence of diameter reduction   Depression    GERD (gastroesophageal reflux disease)    Hypercholesteremia    Hypertension    Mental disorder    PTSD   Pre-diabetes    Shortness of breath    Sleep apnea    wears CPAP nightly, settings at 14    Tobacco abuse    Urinary incontinence    due to prostate removal   Medications:  No PTA anticoagulation per chart review  Baseline Labs Hgb 10.1 Plt 310 INR/PT - pending   Assessment: RR is a 68 yo male who presented to the ED c/o PE in his lung. They received a phone call today to seek treatment in the ED due to the results of an outpatient CT scan. They've had a history significant for SOB and cough over the last 3  weeks. They have to rest after ambulating over short distances for rest. They have a past medical history significant for prostate cancer. Pharmacy has been consulted to manage this patient's heparin.  4/9 AM update:  Heparin level sub-therapeutic  Goal of Therapy:  Heparin level 0.3-0.7 units/ml Monitor platelets by anticoagulation protocol: Yes   Plan:  Inc heparin to 1950 units/hr Heparin level in 8 hours  Abran Duke, PharmD, BCPS Clinical Pharmacist Phone: [phone]

## 2023-12-03 NOTE — Progress Notes (Signed)
 Patient proceed with work up on CT Angio and was noted to find a PE in right lower lobe and was started on anticoagulation therapy.  Patient also proceed with Echo, WNL.  RN reviewed findings with Dr. Cherly Hensen, patient to resume Xtandi at this time.  RN notified patient, he verbalizes understanding. Follow up remains on 4/24.

## 2023-12-15 ENCOUNTER — Other Ambulatory Visit: Payer: Self-pay

## 2023-12-15 DIAGNOSIS — C7951 Secondary malignant neoplasm of bone: Secondary | ICD-10-CM

## 2023-12-15 DIAGNOSIS — D649 Anemia, unspecified: Secondary | ICD-10-CM

## 2023-12-15 DIAGNOSIS — E538 Deficiency of other specified B group vitamins: Secondary | ICD-10-CM

## 2023-12-15 NOTE — Progress Notes (Unsigned)
 [facility] OFFICE PROGRESS NOTE  Patient Care Team: Michael Parkins, MD as PCP - General (Internal Medicine) Michael Rumple, MD as PCP - Cardiology (Cardiology) Michael Ally, MD as PCP - Sleep Medicine (Cardiology) Michael Palmer, RN as Oncology Nurse Navigator  [name] is a very pleasant 68 year old male with history of hypertension, hyperlipidemia, COPD, prostate cancer being follow-up for prostate cancer.  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    Small volume progression after recent radiation. completed sipuleucel-T .  Tolerating well so far.  May proceed weekly, remove central line after third infusion.   Diagnosis: M1 CRPC Germline: Ambry. NEG Somatic: TMB 2.7 m/MB. MSI high not detected. No actionable mutation. Previous treatment:  2007 prostatectomy  May 2010 salvage radiation.   08/2018- current ADT/ARPI.  10-06/2023 Palliative radiation to oliogoprogression to lymph nodes in the mediastinum and abdomen Assessment & Plan   No orders of the defined types were placed in this encounter.    Michael Ruddy, MD  INTERVAL HISTORY: Patient returns for follow-up.  Oncology History  Prostate cancer metastatic to bone [facility])  2000 Initial Diagnosis   Initial diagnosis in [place], [place]   11/2005 Surgery   Robotic radical prostatectomy by [name]. pT3a N0.  Positive margin Gleason 3+4 = 7 adenocarcinoma   07/2008 Progression   PSA 0.11. salvage radiation.  Completed in May 2010. ([name])   2014 Progression   Found in the appendiceal mass undergoing laparoscopy by general surgery.  Pathology showed solitary intraperitoneal metastases from prostate cancer   07/2013 - 04/2017 Chemotherapy   Started intermittent ADT   02/05/2018 Tumor Marker   PSA 3.77   05/19/2018 Tumor Marker   PSA 5.26   08/23/2018 Tumor Marker   PSA 6.22   08/2018 Progression   PSA increased to 6.22 eventually. CT showed multiple sclerotic bone lesions in the spine, sacrum, pelvis and  bone scan with uptake in the right ischium and multiple ribs.   08/2018 -  Chemotherapy   He started enzalutamide . PSA reached nadir <0.2.   03/15/2020 Imaging   Bone scan 1. Resolution of focal radiotracer activity in the right anterior fourth rib and left sacral ala since prior study. 2. Persistent asymmetric activity involving the distal clavicle right greater than left, nonspecific. While metastatic disease cannot be excluded, degenerative changes could also give this pattern. 3. Degenerative type activity of the bilateral glenohumeral joints, cervical spine, and lumbar spine   04/05/2021 Tumor Marker   PSA 0.11   07/17/2021 Tumor Marker   PSA 0.19 Testosterone  13.3   2023 Progression   Rising PSA   09/20/2021 Tumor Marker   PSA 0.26 Testosterone  20.9   12/16/2021 Tumor Marker   PSA 0.57 Testosterone  25.3   03/26/2022 Tumor Marker   PSA 1.17 Testosterone  14.1   06/2022 Imaging   PSMA PET: Positive abdominal lymph nodes mildly improved compared to prior conventional imaging 2020.   06/25/2022 Tumor Marker   PSA 1.36 Testosterone  13.9   10/01/2022 Tumor Marker   PSA 3.7 Testosterone  16.1   01/05/2023 Tumor Marker   PSA 4.2 Testosterone  17.6   03/2023 Tumor Marker   PSA 7.18   04/29/2023 Cancer Staging   Staging form: Prostate, AJCC 8th Edition - Clinical: Stage IVB (cT3a, cN0, cM1b, Grade Group: 2) - Signed by Michael Ruddy, MD on 04/29/2023 Gleason score: 7 Histologic grading system: 5 grade system    Genetic Testing   Ambry testing. Negative for HRR mutation Tempus Liquid: Negative for HRR mutation  05/12/2023 Imaging   PSMA PET A high left posterior mediastinal node measures 7 mm and a S.U.V. max of 3.6 on 54/4. This measured 5 mm and was not tracer avid previously. A subcarinal node measures 8 mm and a S.U.V. max of 3.4 on 83/3. Compare 7 mm and not tracer avid on the prior. Lymph nodes: Porta hepatis nodes of up to 1.7 cm and a S.U.V. max of 36.6 on  114/4. Compare 9 mm and a S.U.V. max of 21.4 on the prior. Left periaortic node measures 1.3 cm and a S.U.V. max of 35.3 on 134/4 versus 6 mm and a S.U.V. max of 4.5 on the prior. New tracer avid abdominal retroperitoneal node in the preaortic space at 4 mm and a S.U.V. max of 6.7 on 143/4.   Liver: No evidence of liver metastasis.  No focal activity to suggest skeletal metastasis. Lumbar spine fixation. Cervical spine fixation.   06/22/2023 - 07/03/2023 Radiation Therapy   ultrahypofractionated radiotherapy (UHRT) mediastinal and intraabdominal LNs   07/29/2023 -  Chemotherapy   Eligard  22.5 mg  PSA 15.8   09/07/2023 PET scan   PSMA PET 1. Stable intensely radiotracer avid retroperitoneal and periportal lymph nodes consistent with prostate carcinoma nodal metastasis. No interval change. 2. No evidence of local recurrence in the prostatectomy bed. 3. No evidence of visceral metastasis. 4. Single new small focus of radiotracer activity in the RIGHT acetabulum is concerning oligometastatic skeletal metastasis.   10/22/2023 -  Chemotherapy   Patient is on Treatment Plan : PROSTATE Sipuleucel-T  q14d        PHYSICAL EXAMINATION: ECOG PERFORMANCE STATUS: {CHL ONC ECOG [phone]}  There were no vitals filed for this visit. There were no vitals filed for this visit.  GENERAL: alert, no distress and comfortable SKIN: skin color normal and no bruising or petechiae or jaundice on exposed skin EYES: normal, sclera clear OROPHARYNX: no exudate  NECK: No palpable mass LYMPH:  no palpable cervical, axillary lymphadenopathy  LUNGS: clear to auscultation and percussion with normal breathing effort HEART: regular rate & rhythm  ABDOMEN: abdomen soft, non-tender and nondistended. Musculoskeletal: no edema NEURO: no focal motor/sensory deficits  Relevant data reviewed during this visit included ***

## 2023-12-15 NOTE — Assessment & Plan Note (Signed)
 He is taking iron daily.  No signs of bone metastases causing anemia.  Repeat ferritin

## 2023-12-15 NOTE — Assessment & Plan Note (Signed)
 Continue apixaban

## 2023-12-15 NOTE — Addendum Note (Signed)
 Addended by: Janeann Mean on: 12/15/2023 04:23 PM   Modules accepted: Orders

## 2023-12-15 NOTE — Assessment & Plan Note (Signed)
 Continue ADT with enzalutamide   S/p Sipuleucel-T  Will obtain PSMA PET Repeat lab today and next Month, ordered.

## 2023-12-15 NOTE — Assessment & Plan Note (Signed)
 Bone health Taking vitamin D 1000 - 2000 IUs and calcium supplement 1000 mg-1200 mg daily Zometa can be considered in the future if worsening bone density, or development of new bone metastases.  No teeth left. Aggressive cardiovascular risk management to control diabetes, hypertension and heart disease. He will continue his medications through PCP.

## 2023-12-17 ENCOUNTER — Other Ambulatory Visit

## 2023-12-17 ENCOUNTER — Inpatient Hospital Stay

## 2023-12-17 ENCOUNTER — Telehealth: Payer: Self-pay

## 2023-12-17 ENCOUNTER — Other Ambulatory Visit: Payer: Self-pay

## 2023-12-17 ENCOUNTER — Other Ambulatory Visit (HOSPITAL_COMMUNITY): Payer: Self-pay

## 2023-12-17 VITALS — BP 104/68 | HR 84 | Temp 97.8°F | Resp 18 | Ht 74.0 in | Wt 239.5 lb

## 2023-12-17 DIAGNOSIS — I10 Essential (primary) hypertension: Secondary | ICD-10-CM | POA: Diagnosis not present

## 2023-12-17 DIAGNOSIS — Z9189 Other specified personal risk factors, not elsewhere classified: Secondary | ICD-10-CM

## 2023-12-17 DIAGNOSIS — E119 Type 2 diabetes mellitus without complications: Secondary | ICD-10-CM | POA: Insufficient documentation

## 2023-12-17 DIAGNOSIS — C61 Malignant neoplasm of prostate: Secondary | ICD-10-CM | POA: Insufficient documentation

## 2023-12-17 DIAGNOSIS — D649 Anemia, unspecified: Secondary | ICD-10-CM | POA: Insufficient documentation

## 2023-12-17 DIAGNOSIS — Z5111 Encounter for antineoplastic chemotherapy: Secondary | ICD-10-CM | POA: Diagnosis present

## 2023-12-17 DIAGNOSIS — Z79899 Other long term (current) drug therapy: Secondary | ICD-10-CM | POA: Diagnosis not present

## 2023-12-17 DIAGNOSIS — C7951 Secondary malignant neoplasm of bone: Secondary | ICD-10-CM

## 2023-12-17 DIAGNOSIS — J438 Other emphysema: Secondary | ICD-10-CM | POA: Diagnosis not present

## 2023-12-17 DIAGNOSIS — E538 Deficiency of other specified B group vitamins: Secondary | ICD-10-CM

## 2023-12-17 DIAGNOSIS — C786 Secondary malignant neoplasm of retroperitoneum and peritoneum: Secondary | ICD-10-CM | POA: Insufficient documentation

## 2023-12-17 DIAGNOSIS — J189 Pneumonia, unspecified organism: Secondary | ICD-10-CM

## 2023-12-17 DIAGNOSIS — I2699 Other pulmonary embolism without acute cor pulmonale: Secondary | ICD-10-CM | POA: Diagnosis not present

## 2023-12-17 LAB — CMP (CANCER CENTER ONLY)
ALT: 10 U/L (ref 0–44)
AST: 19 U/L (ref 15–41)
Albumin: 4.1 g/dL (ref 3.5–5.0)
Alkaline Phosphatase: 60 U/L (ref 38–126)
Anion gap: 7 (ref 5–15)
BUN: 13 mg/dL (ref 8–23)
CO2: 29 mmol/L (ref 22–32)
Calcium: 9.9 mg/dL (ref 8.9–10.3)
Chloride: 99 mmol/L (ref 98–111)
Creatinine: 0.8 mg/dL (ref 0.61–1.24)
GFR, Estimated: >60 mL/min (ref >60)
Glucose, Bld: 107 mg/dL — ABNORMAL HIGH (ref 70–99)
Potassium: 4.3 mmol/L (ref 3.5–5.1)
Sodium: 135 mmol/L (ref 135–145)
Total Bilirubin: 0.4 mg/dL (ref 0.0–1.2)
Total Protein: 7.7 g/dL (ref 6.5–8.1)

## 2023-12-17 LAB — CBC WITH DIFFERENTIAL (CANCER CENTER ONLY)
Abs Immature Granulocytes: 0.07 10*3/uL (ref 0.00–0.07)
Basophils Absolute: 0.1 10*3/uL (ref 0.0–0.1)
Basophils Relative: 1 %
Eosinophils Absolute: 0.6 10*3/uL — ABNORMAL HIGH (ref 0.0–0.5)
Eosinophils Relative: 6 %
HCT: 33.8 % — ABNORMAL LOW (ref 39.0–52.0)
Hemoglobin: 10.9 g/dL — ABNORMAL LOW (ref 13.0–17.0)
Immature Granulocytes: 1 %
Lymphocytes Relative: 7 %
Lymphs Abs: 0.6 10*3/uL — ABNORMAL LOW (ref 0.7–4.0)
MCH: 31.5 pg (ref 26.0–34.0)
MCHC: 32.2 g/dL (ref 30.0–36.0)
MCV: 97.7 fL (ref 80.0–100.0)
Monocytes Absolute: 1 10*3/uL (ref 0.1–1.0)
Monocytes Relative: 10 %
Neutro Abs: 7.4 10*3/uL (ref 1.7–7.7)
Neutrophils Relative %: 75 %
Platelet Count: 373 10*3/uL (ref 150–400)
RBC: 3.46 MIL/uL — ABNORMAL LOW (ref 4.22–5.81)
RDW: 13.2 % (ref 11.5–15.5)
WBC Count: 9.7 10*3/uL (ref 4.0–10.5)
nRBC: 0 % (ref 0.0–0.2)

## 2023-12-17 LAB — SAMPLE TO BLOOD BANK

## 2023-12-17 LAB — VITAMIN B12: Vitamin B-12: 467 pg/mL (ref 180–914)

## 2023-12-17 LAB — FERRITIN: Ferritin: 510 ng/mL — ABNORMAL HIGH (ref 24–336)

## 2023-12-17 LAB — FOLATE: Folate: 15.8 ng/mL (ref >5.9)

## 2023-12-17 MED ORDER — ENZALUTAMIDE 40 MG PO CAPS
160.0000 mg | ORAL_CAPSULE | Freq: Every day | ORAL | 11 refills | Status: DC
  Filled 2023-12-17 (×3): qty 120, 30d supply, fill #0
  Filled 2024-01-12: qty 120, 30d supply, fill #1
  Filled 2024-02-18: qty 120, 30d supply, fill #2

## 2023-12-17 MED ORDER — DOXYCYCLINE HYCLATE 100 MG PO TABS: 100.0000 mg | ORAL_TABLET | Freq: Two times a day (BID) | ORAL | 0 refills | Status: DC

## 2023-12-17 NOTE — Progress Notes (Signed)
 Specialty Pharmacy Refill Coordination Note  [name] is a 68 y.o. male contacted today regarding refills of specialty medication(s) Enzalutamide  (XTANDI )   Patient requested Delivery   Delivery date: 12/23/23   Verified address: [address]   [place] [address]   Medication will be filled on 12/22/23.

## 2023-12-17 NOTE — Progress Notes (Signed)
 Specialty Pharmacy Ongoing Clinical Assessment Note  [name] is a 68 y.o. male who is being followed by the specialty pharmacy service for RxSp Oncology   Patient's specialty medication(s) reviewed today: Enzalutamide  (XTANDI )   Missed doses in the last 4 weeks: 0   Patient/Caregiver did not have any additional questions or concerns.   Therapeutic benefit summary: Patient is achieving benefit   Adverse events/side effects summary: No adverse events/side effects   Patient's therapy is appropriate to: Continue    Goals Addressed             This Visit's Progress    Maintain optimal adherence to therapy   On track    Patient is on track. Patient will maintain adherence         Follow up:  3 months  Samara Stankowski M Sebert Stollings Specialty Pharmacist

## 2023-12-17 NOTE — Telephone Encounter (Signed)
 TC to [facility] to make sure they have the information needed to process referral by Dr. [name] today. Informed that they have received the referral and do not need any additional information.

## 2023-12-18 LAB — PROSTATE-SPECIFIC AG, SERUM (LABCORP): Prostate Specific Ag, Serum: 6.8 ng/mL — ABNORMAL HIGH (ref 0.0–4.0)

## 2023-12-18 LAB — TESTOSTERONE: Testosterone: 19 ng/dL — ABNORMAL LOW (ref 264–916)

## 2023-12-22 ENCOUNTER — Other Ambulatory Visit: Payer: Self-pay

## 2023-12-23 ENCOUNTER — Encounter (HOSPITAL_BASED_OUTPATIENT_CLINIC_OR_DEPARTMENT_OTHER): Payer: Self-pay

## 2023-12-23 ENCOUNTER — Ambulatory Visit (INDEPENDENT_AMBULATORY_CARE_PROVIDER_SITE_OTHER): Admitting: Internal Medicine

## 2023-12-23 ENCOUNTER — Encounter: Payer: Self-pay | Admitting: Internal Medicine

## 2023-12-23 VITALS — BP 114/69 | HR 94 | Ht 74.0 in | Wt 240.0 lb

## 2023-12-23 DIAGNOSIS — Z87891 Personal history of nicotine dependence: Secondary | ICD-10-CM | POA: Diagnosis not present

## 2023-12-23 DIAGNOSIS — R0609 Other forms of dyspnea: Secondary | ICD-10-CM

## 2023-12-23 DIAGNOSIS — I10 Essential (primary) hypertension: Secondary | ICD-10-CM

## 2023-12-23 MED ORDER — VALSARTAN 160 MG PO TABS: 160.0000 mg | ORAL_TABLET | Freq: Every day | ORAL | 11 refills | Status: DC

## 2023-12-23 MED ORDER — ACETAMINOPHEN-CODEINE 300-30 MG PO TABS: 1.0000 | ORAL_TABLET | ORAL | 0 refills | Status: AC | PRN

## 2023-12-23 MED ORDER — PREDNISONE 10 MG PO TABS
ORAL_TABLET | ORAL | 0 refills | Status: DC
Start: 2023-12-23 — End: 2024-01-14

## 2023-12-23 MED ORDER — FAMOTIDINE 20 MG PO TABS: ORAL_TABLET | ORAL | 11 refills | Status: DC

## 2023-12-23 NOTE — Progress Notes (Signed)
 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 Admission provider HPI:   68 y.o. male with medical history significant of hypertension, hyperlipidemia, GERD, metastatic prostate cancer on enzalutamide  and sipuleucel-T  (per ED medical record) who presents to the emergency department due to 3-week onset of cough and shortness of breath that worsens with exertion.  He had an outpatient CT angiography of chest which showed PE in the right lower lobe and lateral segmental pulmonary artery.  He was asked to go to the ED for further evaluation and management.  Patient denies any chest pain, but endorsed some shortness of breath.   ED Course:  In the emergency department, BP was soft at 105/66, but other vital signs were within normal range.  Workup in the ED showed macrocytic anemia,, BMP was normal except for blood glucose of 102.  Troponin x 1 - 4, magnesium  2.1.   CT angiography chest with contrast showed pulmonary embolism within the right lower lobe lateral segmental pulmonary artery.  Patient was started on heparin  drip.  Hospitalist was asked to admit patient for further evaluation and management.     HOSPITAL COURSE BY PROBLEM LIST    Acute pulmonary embolism Patient presented with shortness of breath - NOW RESOLVED SYMPTOMS CT angiography chest with contrast  showed pulmonary embolism within the right lower lobe lateral segmental pulmonary artery. Patient was started on IV heparin  drip with plan to transition to DOAC  Patient was transitioned to apixaban  on 4/9: pharm D recommended 10 mg BID thru 4/15, then 5 mg BID Bilateral lower extremity ultrasound negative for findings of DVT Echocardiogram with no findings of heart strain.  LVEF 65-70% with grade 1 DD; mild RV dilatation with normal contraction.  Bleeding precautions while on anticoagulant therapy advised    Prolonged QT interval QTc 581 ms Avoid QT prolonging drugs recommended Follow up with PCP    Essential hypertension Resume home meds at DC    Mixed hyperlipidemia Continue Crestor , fenofibrate    GERD Continue Protonix    Metastatic prostate cancer Patient follows with [name] Last office visit was 10/29/2023   Discharge Diagnoses:  Principal Problem:   Pulmonary embolism (HCC)   Essential hypertension   Mixed hyperlipidemia   GERD (gastroesophageal reflux disease)   Prolonged QT interval     History of Present Illness  12/23/2023  Pulmonary/ 1st office eval/ [name] / [facility]  Chief Complaint  Patient presents with   Establish Care   Dyspnea:  25 ft with rollway Cough: worse when first down but then subsides and not present first thing in am min mucoid production  Sleep: flat pillow one bed  SABA use: no better 02: none     No obvious day to day or daytime pattern/variability or assoc excess/ purulent sputum or mucus plugs or hemoptysis or  cp or chest tightness, subjective wheeze or overt sinus or hb symptoms.    Also denies any obvious fluctuation of symptoms with weather or environmental changes or other aggravating or alleviating factors except as outlined above   No unusual exposure hx or h/o childhood pna/ asthma or knowledge of premature birth.  Current Allergies, Complete Past Medical History, Past Surgical History, Family History, and  Social History were reviewed in [employer] record.  ROS  The following are not active complaints unless bolded Hoarseness, sore throat, dysphagia, dental problems, itching, sneezing,  nasal congestion or discharge of excess mucus or purulent secretions, ear ache,   fever, chills, sweats, unintended wt loss or wt gain, classically pleuritic or exertional cp,  orthopnea pnd or arm/hand swelling  or leg swelling, presyncope, palpitations, abdominal pain, anorexia, nausea, vomiting, diarrhea  or change in bowel habits or change in bladder habits, change in stools or change in urine, dysuria, hematuria,  rash, arthralgias, visual complaints, headache, numbness, weakness or ataxia or problems with walking or coordination,  change in mood or  memory.            Outpatient Medications Prior to Visit  Medication Sig Dispense Refill   albuterol  (VENTOLIN  HFA) 108 (90 Base) MCG/ACT inhaler Inhale 2 puffs into the lungs every 4 (four) hours as needed.     apixaban  (ELIQUIS ) 5 MG TABS tablet 2 po bid thru 4/15, then 1 po BID 60 tablet 2   ARIPiprazole  (ABILIFY ) 10 MG tablet Take 10 mg by mouth at bedtime.     Choline Fenofibrate  135 MG capsule Take 135 mg by mouth at bedtime. trilipix     Coenzyme Q10 (CO Q 10) 100 MG CAPS Take 100 mg by mouth every evening.     desipramine (NOPRAMIN) 100 MG tablet Take 100 mg by mouth at bedtime.   0   doxycycline  (VIBRA -TABS) 100 MG tablet Take 1 tablet (100 mg total) by mouth 2 (two) times daily. 14 tablet 0   enalapril  (VASOTEC ) 10 MG tablet Take 10 mg by mouth every morning.      enzalutamide  (XTANDI ) 40 MG capsule Take 4 capsules (160 mg total) by mouth daily. Takes 4 capsules daily 120 capsule 11   ferrous sulfate 325 (65 FE) MG tablet Take 325 mg by mouth daily.   0   furosemide (LASIX) 20 MG tablet Take 20 mg by mouth daily.     hydroxychloroquine (PLAQUENIL) 200 MG tablet Take 200 mg by mouth 2 (two) times daily.     Krill Oil 300 MG CAPS  Take 300 mg by mouth daily.     Multiple Vitamin (MULTIVITAMIN WITH MINERALS) TABS tablet Take 1 tablet by mouth daily.     pantoprazole  (PROTONIX ) 40 MG tablet Take 1 tablet (40 mg total) by mouth daily. 30 minutes before breakfast 90 tablet 3   rosuvastatin  (CRESTOR ) 40 MG tablet TAKE 1 TABLET DAILY 90 tablet 3   venlafaxine XR (EFFEXOR-XR) 75 MG 24 hr capsule Take 225 mg by mouth daily.  0   No facility-administered medications prior to visit.    Past Medical History:  Diagnosis Date   Anemia    Anxiety    Arthritis    Bladder stone    Cancer (HCC) 2007   prostate CA   COPD (chronic obstructive pulmonary disease) (HCC)    Decreased pulse 08/16/2008   LE doppler - bilateral ABIs normal, bilateral PVRs normal waveform; bilateral common iliac arteries difficult to visualize however velocities suggest  less than 50% diameter reduction; bilateral LE normal velocities w/o evidence of diameter reduction   Depression    GERD (gastroesophageal reflux disease)    Hypercholesteremia    Hypertension    Mental disorder    PTSD   Pre-diabetes    Shortness of breath    Sleep apnea    wears CPAP nightly, settings at 14    Tobacco abuse    Urinary incontinence    due to prostate removal      Objective:     BP 114/69   Pulse 94   Ht 6\' 2"  (1.88 m)   Wt 240 lb (108.9 kg)   SpO2 93%   BMI 30.81 kg/m   SpO2: 93 % RA  Amb mod obese (by BMI)  wm nad with freq cough / throat clearing    HEENT : Oropharynx  nl     Nasal turbinates nl   NECK :  without  apparent JVD/ palpable Nodes/TM    LUNGS: no acc muscle use,  Nl contour chest which is clear to A and P bilaterally without cough on insp or exp maneuvers   CV:  RRR  no s3 or murmur or increase in P2, and no edema   ABD:  soft and nontender   MS:  Gait nl   ext warm without deformities Or obvious joint restrictions  calf tenderness, cyanosis or clubbing    SKIN: warm and dry without lesions    NEURO:  alert, approp, nl  sensorium with  no motor or cerebellar deficits apparent.       I personally reviewed images and agree with radiology impression as follows:   Chest CTa 12/01/23   1. Pulmonary embolism within the right lower lobe lateral segmental pulmonary artery. 2. Flattening of the interventricular septum, which can be seen in the setting of right heart strain. Recommend correlation with echocardiography. 3. The central airways are patent. Increased bilateral central peribronchovascular consolidation and peribronchial wall thickening. New diffuse subpleural reticulations and peribronchovascular ground-glass opacities with architectural distortion. Upper lobe predominant paraseptal emphysema. No pneumothorax. No pleural effusion.    Assessment   [name] (dyspnea on exertion) [name] and cough onset Nov 2024 completing RT / immunotherapy s/p PE 11/2023 with RV strain - 12/23/2023   Walked on RA  x  1   lap(s) =  approx 150  ft  @ slow/rollator pace, stopped due to fatigue and sob  with lowest 02 sats 90% - [name] LABS SENT 12/23/2023   - try off acei 12/23/2023 >>>   Cta chest is c/w sign PE plus radiation and or immunotherapy pneumonitis but the main issue now is upper airway cough with cyclical features so rec   1) try off acei 2) suppress cough with tyl #3 3) Prednisone  10 mg short course: take  4 each am x 2 days,   2 each am x 2 days,  1 each am x 2 days and stop   Essential hypertension D/c acei 12/23/2023 due to incessant cough / throat clearing   In the best review of chronic cough to date ( NEJM 2016 375 1610-9604) ,  ACEi are now felt to cause cough in up to  20% of pts which is a 4 fold increase from previous reports and does not include the variety of non-specific complaints we see in pulmonary clinic in pts on ACEi but previously attributed to another dx like  Copd/asthma and  include PNDS, throat and chest congestion, "bronchitis", unexplained dyspnea and  noct "strangling" sensations, and hoarseness,  but also  atypical /refractory GERD symptoms like dysphagia and "bad heartburn"   The only way I know  to prove this is not an "ACEi Case" is a trial off ACEi x a minimum of 6 weeks then regroup.   >>> try valsartan  160 mg one half daily and titrate up to desired level (wife says she can do this)   F/u 6 weeks  Each maintenance medication was reviewed in detail including emphasizing most importantly the difference between maintenance and prns and under what circumstances the prns are to be triggered using an action plan format where appropriate.  Total time for H and P, chart review, counseling, directly observing portions of ambulatory 02 saturation study/ and generating customized AVS unique to this office visit / same day charting = 48 min new pt eva with  multiple  refractory respiratory  symptoms of uncertain etiology                   Vernestine Gondola, MD 12/23/2023

## 2023-12-23 NOTE — Patient Instructions (Addendum)
 Stop krill oil and oil based vitamins until no longer coughing   Stop enalapril   and replace with valsartan 160 mg one daily   Continue Pantoprazole  (protonix ) 40 mg   Take  30-60 min before first meal of the day and add Pepcid (famotidine)  20 mg after supper until return to office - this is the best way to tell whether stomach acid is contributing to your problem.    Take delsym  two tsp every 12 hours and supplement if needed with  Tylenol  #3   up to 1 every 4 hours to suppress the urge to cough. Swallowing water  and/or using ice chips/non mint and menthol  containing candies (such as lifesavers or sugarless jolly ranchers) are also effective.  You should rest your voice and avoid activities that you know make you cough.  Once you have eliminated the cough for 3 straight days try reducing the Tylenol  #3 first,  then the delsym  as tolerated.     Please remember to go to the lab department   for your tests - we will call you with the results when they are available.  Prednisone 10 mg take  4 each am x 2 days,   2 each am x 2 days,  1 each am x 2 days and stop      Please schedule a follow up office visit in 6 weeks, call sooner if needed

## 2023-12-24 ENCOUNTER — Other Ambulatory Visit: Payer: Self-pay

## 2023-12-24 NOTE — Assessment & Plan Note (Addendum)
 Doe and cough onset Nov 2024 completing RT / immunotherapy s/p PE 11/2023 with RV strain - 12/23/2023   Walked on RA  x  1   lap(s) =  approx 150  ft  @ slow/rollator pace, stopped due to fatigue and sob  with lowest 02 sats 90% - DOE LABS SENT 12/23/2023   - try off acei 12/23/2023 >>>   Cta chest is c/w sign PE plus radiation and or immunotherapy pneumonitis but the main issue now is upper airway cough with cyclical features so rec   1) try off acei 2) suppress cough with tyl #3 3) Prednisone  10 mg short course: take  4 each am x 2 days,   2 each am x 2 days,  1 each am x 2 days and stop

## 2023-12-24 NOTE — Assessment & Plan Note (Signed)
 D/c acei 12/23/2023 due to incessant cough / throat clearing   In the best review of chronic cough to date ( NEJM 2016 375 [id number]) ,  ACEi are now felt to cause cough in up to  20% of pts which is a 4 fold increase from previous reports and does not include the variety of non-specific complaints we see in pulmonary clinic in pts on ACEi but previously attributed to another dx like  Copd/asthma and  include PNDS, throat and chest congestion, "bronchitis", unexplained dyspnea and noct "strangling" sensations, and hoarseness, but also  atypical /refractory GERD symptoms like dysphagia and "bad heartburn"   The only way I know  to prove this is not an "ACEi Case" is a trial off ACEi x a minimum of 6 weeks then regroup.   >>> try valsartan  160 mg one half daily and titrate up to desired level (wife says she can do this)   F/u 6 weeks  Each maintenance medication was reviewed in detail including emphasizing most importantly the difference between maintenance and prns and under what circumstances the prns are to be triggered using an action plan format where appropriate.  Total time for H and P, chart review, counseling, directly observing portions of ambulatory 02 saturation study/ and generating customized AVS unique to this office visit / same day charting = 48 min new pt eva with  multiple  refractory respiratory  symptoms of uncertain etiology

## 2023-12-25 ENCOUNTER — Telehealth: Payer: Self-pay

## 2023-12-25 DIAGNOSIS — R0609 Other forms of dyspnea: Secondary | ICD-10-CM

## 2023-12-25 DIAGNOSIS — E669 Obesity, unspecified: Secondary | ICD-10-CM

## 2023-12-25 DIAGNOSIS — J4489 Other specified chronic obstructive pulmonary disease: Secondary | ICD-10-CM

## 2023-12-25 NOTE — Telephone Encounter (Signed)
 Spoke with [employer]. They can provide patient with an upright rollator walker which would better suit his height and his arthritis in his hands. Please advise if ok to place order. Thank you!

## 2023-12-25 NOTE — Telephone Encounter (Signed)
 Yes, ok to order

## 2023-12-28 LAB — ALPHA-1-ANTITRYPSIN PHENOTYP: A-1 Antitrypsin: 211 mg/dL — ABNORMAL HIGH (ref 101–187)

## 2023-12-28 LAB — BASIC METABOLIC PANEL WITH GFR
BUN/Creatinine Ratio: 27 — ABNORMAL HIGH (ref 10–24)
BUN: 20 mg/dL (ref 8–27)
CO2: 20 mmol/L (ref 20–29)
Calcium: 9.5 mg/dL (ref 8.6–10.2)
Chloride: 98 mmol/L (ref 96–106)
Creatinine, Ser: 0.73 mg/dL — ABNORMAL LOW (ref 0.76–1.27)
Glucose: 97 mg/dL (ref 70–99)
Potassium: 4.6 mmol/L (ref 3.5–5.2)
Sodium: 133 mmol/L — ABNORMAL LOW (ref 134–144)
eGFR: 99 mL/min/{1.73_m2} (ref >59)

## 2023-12-28 LAB — CBC WITH DIFFERENTIAL/PLATELET
Basophils Absolute: 0 10*3/uL (ref 0.0–0.2)
Basos: 1 %
EOS (ABSOLUTE): 0.5 10*3/uL — ABNORMAL HIGH (ref 0.0–0.4)
Eos: 6 %
Hematocrit: 31.6 % — ABNORMAL LOW (ref 37.5–51.0)
Hemoglobin: 10.4 g/dL — ABNORMAL LOW (ref 13.0–17.7)
Immature Grans (Abs): 0.1 10*3/uL (ref 0.0–0.1)
Immature Granulocytes: 1 %
Lymphocytes Absolute: 0.8 10*3/uL (ref 0.7–3.1)
Lymphs: 10 %
MCH: 31.9 pg (ref 26.6–33.0)
MCHC: 32.9 g/dL (ref 31.5–35.7)
MCV: 97 fL (ref 79–97)
Monocytes Absolute: 1 10*3/uL — ABNORMAL HIGH (ref 0.1–0.9)
Monocytes: 11 %
Neutrophils Absolute: 6.1 10*3/uL (ref 1.4–7.0)
Neutrophils: 71 %
Platelets: 394 10*3/uL (ref 150–450)
RBC: 3.26 x10E6/uL — ABNORMAL LOW (ref 4.14–5.80)
RDW: 13.1 % (ref 11.6–15.4)
WBC: 8.5 10*3/uL (ref 3.4–10.8)

## 2023-12-28 LAB — SEDIMENTATION RATE: Sed Rate: 76 mm/h — ABNORMAL HIGH (ref 0–30)

## 2023-12-28 LAB — TSH: TSH: 2.18 u[IU]/mL (ref 0.450–4.500)

## 2023-12-28 LAB — BRAIN NATRIURETIC PEPTIDE: BNP: 14.9 pg/mL (ref 0.0–100.0)

## 2023-12-28 NOTE — Telephone Encounter (Signed)
 Spoke with patient and advised of order to be placed. He is agreeable.   Order sent to [employer]. He is aware they will contact him. Nothing further needed at this time.

## 2023-12-30 ENCOUNTER — Other Ambulatory Visit (HOSPITAL_COMMUNITY)

## 2023-12-31 ENCOUNTER — Encounter (HOSPITAL_COMMUNITY)
Admission: RE | Admit: 2023-12-31 | Discharge: 2023-12-31 | Disposition: A | Discharge status: Home / Self Care | Source: Ambulatory Visit

## 2023-12-31 DIAGNOSIS — C7951 Secondary malignant neoplasm of bone: Secondary | ICD-10-CM | POA: Insufficient documentation

## 2023-12-31 DIAGNOSIS — C61 Malignant neoplasm of prostate: Secondary | ICD-10-CM | POA: Diagnosis present

## 2023-12-31 MED ORDER — FLOTUFOLASTAT F 18 GALLIUM 296-5846 MBQ/ML IV SOLN
8.2000 | Freq: Once | INTRAVENOUS | Status: AC
  Administered 2023-12-31: 8.2 via INTRAVENOUS

## 2024-01-07 ENCOUNTER — Other Ambulatory Visit: Payer: Self-pay | Admitting: Gastroenterology

## 2024-01-12 ENCOUNTER — Other Ambulatory Visit: Payer: Self-pay

## 2024-01-12 ENCOUNTER — Other Ambulatory Visit: Payer: Self-pay | Admitting: Pharmacy Technician

## 2024-01-12 ENCOUNTER — Encounter: Payer: Self-pay | Admitting: Cardiovascular Disease

## 2024-01-12 NOTE — Progress Notes (Signed)
 Specialty Pharmacy Refill Coordination Note  [name] is a 68 y.o. male contacted today regarding refills of specialty medication(s) Enzalutamide  (XTANDI )   Patient requested (Patient-Rptd) Delivery   Delivery date: (Patient-Rptd) 01/22/24   Verified address: (Patient-Rptd) [address] [place], [place], [place] [address]   Medication will be filled on 01/21/24.

## 2024-01-14 ENCOUNTER — Emergency Department (HOSPITAL_COMMUNITY)

## 2024-01-14 ENCOUNTER — Encounter (HOSPITAL_COMMUNITY): Payer: Self-pay

## 2024-01-14 ENCOUNTER — Inpatient Hospital Stay (HOSPITAL_COMMUNITY)
Admission: EM | Admit: 2024-01-14 | Discharge: 2024-01-21 | DRG: 871 | Disposition: A | Discharge status: Home Health | Attending: Internal Medicine | Admitting: Internal Medicine

## 2024-01-14 ENCOUNTER — Other Ambulatory Visit: Payer: Self-pay

## 2024-01-14 ENCOUNTER — Ambulatory Visit: Payer: Self-pay | Admitting: Internal Medicine

## 2024-01-14 DIAGNOSIS — E669 Obesity, unspecified: Secondary | ICD-10-CM | POA: Diagnosis present

## 2024-01-14 DIAGNOSIS — C7951 Secondary malignant neoplasm of bone: Secondary | ICD-10-CM | POA: Diagnosis not present

## 2024-01-14 DIAGNOSIS — E782 Mixed hyperlipidemia: Secondary | ICD-10-CM | POA: Diagnosis present

## 2024-01-14 DIAGNOSIS — Z860100 Personal history of colon polyps, unspecified: Secondary | ICD-10-CM

## 2024-01-14 DIAGNOSIS — M069 Rheumatoid arthritis, unspecified: Secondary | ICD-10-CM | POA: Diagnosis present

## 2024-01-14 DIAGNOSIS — J439 Emphysema, unspecified: Secondary | ICD-10-CM | POA: Diagnosis present

## 2024-01-14 DIAGNOSIS — Z86711 Personal history of pulmonary embolism: Secondary | ICD-10-CM | POA: Diagnosis not present

## 2024-01-14 DIAGNOSIS — R918 Other nonspecific abnormal finding of lung field: Secondary | ICD-10-CM | POA: Diagnosis not present

## 2024-01-14 DIAGNOSIS — J942 Hemothorax: Secondary | ICD-10-CM | POA: Diagnosis not present

## 2024-01-14 DIAGNOSIS — R9431 Abnormal electrocardiogram [ECG] [EKG]: Secondary | ICD-10-CM | POA: Diagnosis present

## 2024-01-14 DIAGNOSIS — J9811 Atelectasis: Secondary | ICD-10-CM | POA: Diagnosis present

## 2024-01-14 DIAGNOSIS — E222 Syndrome of inappropriate secretion of antidiuretic hormone: Secondary | ICD-10-CM | POA: Diagnosis present

## 2024-01-14 DIAGNOSIS — Z86718 Personal history of other venous thrombosis and embolism: Secondary | ICD-10-CM

## 2024-01-14 DIAGNOSIS — Z833 Family history of diabetes mellitus: Secondary | ICD-10-CM

## 2024-01-14 DIAGNOSIS — Z83719 Family history of colon polyps, unspecified: Secondary | ICD-10-CM

## 2024-01-14 DIAGNOSIS — R0689 Other abnormalities of breathing: Secondary | ICD-10-CM | POA: Diagnosis not present

## 2024-01-14 DIAGNOSIS — Z7901 Long term (current) use of anticoagulants: Secondary | ICD-10-CM | POA: Diagnosis not present

## 2024-01-14 DIAGNOSIS — I959 Hypotension, unspecified: Secondary | ICD-10-CM | POA: Diagnosis not present

## 2024-01-14 DIAGNOSIS — I10 Essential (primary) hypertension: Secondary | ICD-10-CM | POA: Diagnosis present

## 2024-01-14 DIAGNOSIS — E1165 Type 2 diabetes mellitus with hyperglycemia: Secondary | ICD-10-CM | POA: Diagnosis present

## 2024-01-14 DIAGNOSIS — Z8042 Family history of malignant neoplasm of prostate: Secondary | ICD-10-CM

## 2024-01-14 DIAGNOSIS — J440 Chronic obstructive pulmonary disease with acute lower respiratory infection: Secondary | ICD-10-CM | POA: Diagnosis present

## 2024-01-14 DIAGNOSIS — Z880 Allergy status to penicillin: Secondary | ICD-10-CM

## 2024-01-14 DIAGNOSIS — Z87891 Personal history of nicotine dependence: Secondary | ICD-10-CM

## 2024-01-14 DIAGNOSIS — Z5181 Encounter for therapeutic drug level monitoring: Secondary | ICD-10-CM | POA: Diagnosis not present

## 2024-01-14 DIAGNOSIS — J441 Chronic obstructive pulmonary disease with (acute) exacerbation: Secondary | ICD-10-CM | POA: Diagnosis present

## 2024-01-14 DIAGNOSIS — J9600 Acute respiratory failure, unspecified whether with hypoxia or hypercapnia: Secondary | ICD-10-CM | POA: Diagnosis not present

## 2024-01-14 DIAGNOSIS — E1169 Type 2 diabetes mellitus with other specified complication: Secondary | ICD-10-CM | POA: Diagnosis present

## 2024-01-14 DIAGNOSIS — J910 Malignant pleural effusion: Secondary | ICD-10-CM | POA: Diagnosis present

## 2024-01-14 DIAGNOSIS — J4489 Other specified chronic obstructive pulmonary disease: Secondary | ICD-10-CM | POA: Diagnosis present

## 2024-01-14 DIAGNOSIS — E871 Hypo-osmolality and hyponatremia: Secondary | ICD-10-CM

## 2024-01-14 DIAGNOSIS — E872 Acidosis, unspecified: Secondary | ICD-10-CM | POA: Diagnosis present

## 2024-01-14 DIAGNOSIS — J90 Pleural effusion, not elsewhere classified: Principal | ICD-10-CM | POA: Diagnosis present

## 2024-01-14 DIAGNOSIS — Z6830 Body mass index (BMI) 30.0-30.9, adult: Secondary | ICD-10-CM | POA: Diagnosis not present

## 2024-01-14 DIAGNOSIS — R590 Localized enlarged lymph nodes: Secondary | ICD-10-CM | POA: Diagnosis present

## 2024-01-14 DIAGNOSIS — C61 Malignant neoplasm of prostate: Secondary | ICD-10-CM | POA: Diagnosis present

## 2024-01-14 DIAGNOSIS — F4310 Post-traumatic stress disorder, unspecified: Secondary | ICD-10-CM | POA: Diagnosis present

## 2024-01-14 DIAGNOSIS — Z800 Family history of malignant neoplasm of digestive organs: Secondary | ICD-10-CM

## 2024-01-14 DIAGNOSIS — Z79899 Other long term (current) drug therapy: Secondary | ICD-10-CM

## 2024-01-14 DIAGNOSIS — J189 Pneumonia, unspecified organism: Secondary | ICD-10-CM | POA: Diagnosis present

## 2024-01-14 DIAGNOSIS — Y95 Nosocomial condition: Secondary | ICD-10-CM | POA: Diagnosis present

## 2024-01-14 DIAGNOSIS — K219 Gastro-esophageal reflux disease without esophagitis: Secondary | ICD-10-CM | POA: Diagnosis present

## 2024-01-14 DIAGNOSIS — A419 Sepsis, unspecified organism: Secondary | ICD-10-CM | POA: Diagnosis present

## 2024-01-14 DIAGNOSIS — I2699 Other pulmonary embolism without acute cor pulmonale: Secondary | ICD-10-CM | POA: Diagnosis present

## 2024-01-14 DIAGNOSIS — Z923 Personal history of irradiation: Secondary | ICD-10-CM

## 2024-01-14 DIAGNOSIS — Z8249 Family history of ischemic heart disease and other diseases of the circulatory system: Secondary | ICD-10-CM

## 2024-01-14 DIAGNOSIS — Z981 Arthrodesis status: Secondary | ICD-10-CM

## 2024-01-14 DIAGNOSIS — D62 Acute posthemorrhagic anemia: Secondary | ICD-10-CM | POA: Diagnosis not present

## 2024-01-14 DIAGNOSIS — Z8546 Personal history of malignant neoplasm of prostate: Secondary | ICD-10-CM

## 2024-01-14 DIAGNOSIS — M549 Dorsalgia, unspecified: Secondary | ICD-10-CM | POA: Diagnosis present

## 2024-01-14 DIAGNOSIS — Z743 Need for continuous supervision: Secondary | ICD-10-CM | POA: Diagnosis not present

## 2024-01-14 LAB — URINALYSIS, ROUTINE W REFLEX MICROSCOPIC
Bilirubin Urine: NEGATIVE
Glucose, UA: NEGATIVE mg/dL
Hgb urine dipstick: NEGATIVE
Ketones, ur: NEGATIVE mg/dL
Leukocytes,Ua: NEGATIVE
Nitrite: NEGATIVE
Protein, ur: NEGATIVE mg/dL
Specific Gravity, Urine: >1.046 — ABNORMAL HIGH (ref 1.005–1.030)
pH: 5 (ref 5.0–8.0)

## 2024-01-14 LAB — MAGNESIUM: Magnesium: 2.1 mg/dL (ref 1.7–2.4)

## 2024-01-14 LAB — CBC
HCT: 30.5 % — ABNORMAL LOW (ref 39.0–52.0)
Hemoglobin: 9.8 g/dL — ABNORMAL LOW (ref 13.0–17.0)
MCH: 31.6 pg (ref 26.0–34.0)
MCHC: 32.1 g/dL (ref 30.0–36.0)
MCV: 98.4 fL (ref 80.0–100.0)
Platelets: 400 10*3/uL (ref 150–400)
RBC: 3.1 MIL/uL — ABNORMAL LOW (ref 4.22–5.81)
RDW: 14.2 % (ref 11.5–15.5)
WBC: 11.8 10*3/uL — ABNORMAL HIGH (ref 4.0–10.5)
nRBC: 0 % (ref 0.0–0.2)

## 2024-01-14 LAB — BASIC METABOLIC PANEL WITH GFR
Anion gap: 13 (ref 5–15)
BUN: 14 mg/dL (ref 8–23)
CO2: 20 mmol/L — ABNORMAL LOW (ref 22–32)
Calcium: 8.9 mg/dL (ref 8.9–10.3)
Chloride: 96 mmol/L — ABNORMAL LOW (ref 98–111)
Creatinine, Ser: 0.9 mg/dL (ref 0.61–1.24)
GFR, Estimated: >60 mL/min (ref >60)
Glucose, Bld: 134 mg/dL — ABNORMAL HIGH (ref 70–99)
Potassium: 4.1 mmol/L (ref 3.5–5.1)
Sodium: 129 mmol/L — ABNORMAL LOW (ref 135–145)

## 2024-01-14 LAB — HEPATIC FUNCTION PANEL
ALT: 20 U/L (ref 0–44)
AST: 28 U/L (ref 15–41)
Albumin: 2.8 g/dL — ABNORMAL LOW (ref 3.5–5.0)
Alkaline Phosphatase: 82 U/L (ref 38–126)
Bilirubin, Direct: 0.1 mg/dL (ref 0.0–0.2)
Indirect Bilirubin: 0.2 mg/dL — ABNORMAL LOW (ref 0.3–0.9)
Total Bilirubin: 0.3 mg/dL (ref 0.0–1.2)
Total Protein: 7.5 g/dL (ref 6.5–8.1)

## 2024-01-14 LAB — RESP PANEL BY RT-PCR (RSV, FLU A&B, COVID)  RVPGX2
Influenza A by PCR: NEGATIVE
Influenza B by PCR: NEGATIVE
Resp Syncytial Virus by PCR: NEGATIVE
SARS Coronavirus 2 by RT PCR: NEGATIVE

## 2024-01-14 LAB — LACTIC ACID, PLASMA
Lactic Acid, Venous: 2 mmol/L (ref 0.5–1.9)
Lactic Acid, Venous: 1.2 mmol/L (ref 0.5–1.9)

## 2024-01-14 LAB — BRAIN NATRIURETIC PEPTIDE: B Natriuretic Peptide: 28 pg/mL (ref 0.0–100.0)

## 2024-01-14 LAB — TROPONIN I (HIGH SENSITIVITY): Troponin I (High Sensitivity): 5 ng/L (ref <18)

## 2024-01-14 MED ORDER — SODIUM CHLORIDE 0.9 % IV SOLN
2.0000 g | Freq: Once | INTRAVENOUS | Status: AC
  Administered 2024-01-14: 2 g via INTRAVENOUS
  Filled 2024-01-14: qty 10

## 2024-01-14 MED ORDER — VANCOMYCIN HCL IN DEXTROSE 1-5 GM/200ML-% IV SOLN
1000.0000 mg | Freq: Two times a day (BID) | INTRAVENOUS | Status: DC
  Administered 2024-01-15 – 2024-01-17 (×6): 1000 mg via INTRAVENOUS
  Filled 2024-01-14 (×6): qty 200

## 2024-01-14 MED ORDER — ALBUTEROL SULFATE (2.5 MG/3ML) 0.083% IN NEBU
2.5000 mg | INHALATION_SOLUTION | RESPIRATORY_TRACT | Status: DC | PRN
  Administered 2024-01-16 – 2024-01-18 (×2): 2.5 mg via RESPIRATORY_TRACT
  Filled 2024-01-14 (×2): qty 3

## 2024-01-14 MED ORDER — PANTOPRAZOLE SODIUM 40 MG PO TBEC
40.0000 mg | DELAYED_RELEASE_TABLET | Freq: Every day | ORAL | Status: DC
  Administered 2024-01-15 – 2024-01-21 (×7): 40 mg via ORAL
  Filled 2024-01-14 (×7): qty 1

## 2024-01-14 MED ORDER — ACETAMINOPHEN 650 MG RE SUPP
650.0000 mg | Freq: Four times a day (QID) | RECTAL | Status: DC | PRN
  Administered 2024-01-16: 650 mg via RECTAL
  Filled 2024-01-14: qty 1

## 2024-01-14 MED ORDER — IOHEXOL 350 MG/ML SOLN
75.0000 mL | Freq: Once | INTRAVENOUS | Status: AC | PRN
  Administered 2024-01-14: 75 mL via INTRAVENOUS

## 2024-01-14 MED ORDER — FAMOTIDINE 20 MG PO TABS
20.0000 mg | ORAL_TABLET | Freq: Every day | ORAL | Status: DC
  Administered 2024-01-15 – 2024-01-16 (×2): 20 mg via ORAL
  Filled 2024-01-14 (×2): qty 1

## 2024-01-14 MED ORDER — ACETAMINOPHEN 325 MG PO TABS
650.0000 mg | ORAL_TABLET | Freq: Four times a day (QID) | ORAL | Status: DC | PRN
  Administered 2024-01-15 – 2024-01-20 (×2): 650 mg via ORAL
  Filled 2024-01-14 (×2): qty 2

## 2024-01-14 MED ORDER — METRONIDAZOLE 500 MG/100ML IV SOLN
500.0000 mg | Freq: Two times a day (BID) | INTRAVENOUS | Status: DC
  Administered 2024-01-14 – 2024-01-17 (×7): 500 mg via INTRAVENOUS
  Filled 2024-01-14 (×8): qty 100

## 2024-01-14 MED ORDER — HEPARIN (PORCINE) 25000 UT/250ML-% IV SOLN
2400.0000 [IU]/h | INTRAVENOUS | Status: DC
  Administered 2024-01-14: 1700 [IU]/h via INTRAVENOUS
  Administered 2024-01-15: 1800 [IU]/h via INTRAVENOUS
  Administered 2024-01-16: 2600 [IU]/h via INTRAVENOUS
  Administered 2024-01-16: 2200 [IU]/h via INTRAVENOUS
  Administered 2024-01-17: 2400 [IU]/h via INTRAVENOUS
  Filled 2024-01-14 (×5): qty 250

## 2024-01-14 MED ORDER — LACTATED RINGERS IV SOLN: INTRAVENOUS | Status: AC

## 2024-01-14 MED ORDER — IRBESARTAN 150 MG PO TABS
150.0000 mg | ORAL_TABLET | Freq: Every day | ORAL | Status: DC
  Administered 2024-01-15 – 2024-01-17 (×3): 150 mg via ORAL
  Filled 2024-01-14 (×3): qty 1

## 2024-01-14 MED ORDER — SODIUM CHLORIDE 0.9 % IV SOLN
100.0000 mg | Freq: Once | INTRAVENOUS | Status: AC
  Administered 2024-01-14: 100 mg via INTRAVENOUS
  Filled 2024-01-14: qty 100

## 2024-01-14 MED ORDER — VANCOMYCIN HCL IN DEXTROSE 1-5 GM/200ML-% IV SOLN
1000.0000 mg | Freq: Once | INTRAVENOUS | Status: AC
  Administered 2024-01-14: 1000 mg via INTRAVENOUS
  Filled 2024-01-14: qty 200

## 2024-01-14 MED ORDER — FUROSEMIDE 20 MG PO TABS
20.0000 mg | ORAL_TABLET | Freq: Every day | ORAL | Status: DC
  Administered 2024-01-14 – 2024-01-21 (×8): 20 mg via ORAL
  Filled 2024-01-14 (×10): qty 1

## 2024-01-14 MED ORDER — FERROUS SULFATE 325 (65 FE) MG PO TABS
325.0000 mg | ORAL_TABLET | Freq: Every day | ORAL | Status: DC
Start: 2024-01-14 — End: 2024-01-21
  Administered 2024-01-14 – 2024-01-21 (×8): 325 mg via ORAL
  Filled 2024-01-14 (×8): qty 1

## 2024-01-14 MED ORDER — ROSUVASTATIN CALCIUM 20 MG PO TABS
40.0000 mg | ORAL_TABLET | Freq: Every day | ORAL | Status: DC
  Administered 2024-01-14 – 2024-01-21 (×8): 40 mg via ORAL
  Filled 2024-01-14 (×8): qty 2

## 2024-01-14 MED ORDER — SODIUM CHLORIDE 0.9 % IV SOLN
2.0000 g | Freq: Three times a day (TID) | INTRAVENOUS | Status: DC
  Administered 2024-01-15 – 2024-01-18 (×10): 2 g via INTRAVENOUS
  Filled 2024-01-14 (×15): qty 10

## 2024-01-14 NOTE — Telephone Encounter (Signed)
 E2C2 Pulmonary Triage - Initial Assessment Questions "Chief Complaint (e.g., cough, sob, wheezing, fever, chills, sweat or additional symptoms) *Go to specific symptom protocol after initial questions. Patient with recent hx of PE on blood thinners reports increased shortness of breath with any type of activity. Patient reports walking to and from the bathroom causes him to be short of breath. Patient states it takes him 3-4 minutes to recover. Patient reports shortness of breath is moderate.  Patient endorses cough that is productive-patient not sure what color sputum is. Patient does report discomfort to the bottom of lungs-4-5 out of 10. Patient reports using his albuterol  inhaler with some improvement. Patient's pulse oximeter reading today is 93% on room air which is his normal. Patient does have an appointment scheduled with Dr. Waymond Hailey on 02/22/2024. Patient is wanting to see if he can be seen earlier. Patient recommended to UC or ED today for evaluation of his symptoms. Patient is also instructed to follow up with PCP as well. Patient is asking for follow up phone call from pulmonary staff.   "How long have symptoms been present?" Got worse after being seen by Dr. Waymond Hailey on 12/23/2023  Have you tested for COVID or Flu? Note: If not, ask patient if a home test can be taken. If so, instruct patient to call back for positive results. No  MEDICINES:   "Have you used any OTC meds to help with symptoms?" No If yes, ask "What medications?"   "Have you used your inhalers/maintenance medication?" Yes If yes, "What medications?" Albuterol    If inhaler, ask "How many puffs and how often?" Note: Review instructions on medication in the chart. Albuterol  2 puffs q4hr PRN  OXYGEN: "Do you wear supplemental oxygen?" No If yes, "How many liters are you supposed to use?"   "Do you monitor your oxygen levels?" Yes If yes, "What is your reading (oxygen level) today?" 93%  "What is your usual oxygen  saturation reading?"  (Note: Pulmonary O2 sats should be 90% or greater) 93%   Copied from CRM [id number]. Topic: Clinical - Red Word Triage >> Jan 14, 2024  9:13 AM Justina Oman C wrote: Red Word that prompted transfer to Nurse Triage: Patient [phone] states having a lot of trouble breathing, hard to caught his breath just walking to the bathroom, pain in a part of the lungs, and coughing phlegm unsure of the color started a couple of weeks after seeing Dr. Waymond Hailey. Patient denies wheezing, dizziness, headaches nor fever. Please advise. Reason for Disposition  [1] MODERATE difficulty breathing (e.g., speaks in phrases, SOB even at rest, pulse 100-120) AND [2] NEW-onset or WORSE than normal  Answer Assessment - Initial Assessment Questions 1. RESPIRATORY STATUS: "Describe your breathing?" (e.g., wheezing, shortness of breath, unable to speak, severe coughing)      Shortness of breath with any activity 2. ONSET: "When did this breathing problem begin?"      Started prior to 4/30 appointment 3. PATTERN "Does the difficult breathing come and go, or has it been constant since it started?"      constant 4. SEVERITY: "How bad is your breathing?" (e.g., mild, moderate, severe)    - MILD: No SOB at rest, mild SOB with walking, speaks normally in sentences, can lie down, no retractions, pulse < 100.    - MODERATE: SOB at rest, SOB with minimal exertion and prefers to sit, cannot lie down flat, speaks in phrases, mild retractions, audible wheezing, pulse 100-120.    - SEVERE: Very SOB at rest,  speaks in single words, struggling to breathe, sitting hunched forward, retractions, pulse > 120      Moderate 5. RECURRENT SYMPTOM: "Have you had difficulty breathing before?" If Yes, ask: "When was the last time?" and "What happened that time?"      Yes 6. CARDIAC HISTORY: "Do you have any history of heart disease?" (e.g., heart attack, angina, bypass surgery, angioplasty)       7. LUNG HISTORY: "Do you have any  history of lung disease?"  (e.g., pulmonary embolus, asthma, emphysema)     PE 9. OTHER SYMPTOMS: "Do you have any other symptoms? (e.g., dizziness, runny nose, cough, chest pain, fever)     cough  Protocols used: Breathing Difficulty-A-AH

## 2024-01-14 NOTE — ED Provider Notes (Signed)
 [facility] EMERGENCY DEPARTMENT AT [facility] Provider Note   CSN: [id number] Arrival date & time: 01/14/24  1126     History  Chief Complaint  Patient presents with   Shortness of Breath   Dizziness    [name] is a 68 y.o. male.  Patient is a 67 year old male who presents to the emergency department with a chief complaint of worsening shortness of breath, near syncope which has been worsening over the past 2 months.  Patient does note that he has had a recent diagnosis of a pulmonary embolus and is currently on Eliquis  and has been compliant with this.  Patient denies any abdominal pain, nausea, vomiting, diarrhea.  He has had no increased lower extremity edema.  He denies any hemoptysis.  He does also note that he has had an increased ongoing cough that has been productive.  He denies any associated fever or chills.   Shortness of Breath Dizziness Associated symptoms: shortness of breath        Home Medications Prior to Admission medications   Medication Sig Start Date End Date Taking? Authorizing Provider  albuterol  (VENTOLIN  HFA) 108 (90 Base) MCG/ACT inhaler Inhale 2 puffs into the lungs every 4 (four) hours as needed. 12/07/23   [provider]  apixaban  (ELIQUIS ) 5 MG TABS tablet 2 po bid thru 4/15, then 1 po BID 12/02/23   Johnson, Clanford L, MD  ARIPiprazole  (ABILIFY ) 10 MG tablet Take 10 mg by mouth at bedtime. 04/13/18   [provider]  Choline Fenofibrate  135 MG capsule Take 135 mg by mouth at bedtime. trilipix    [provider]  Coenzyme Q10 (CO Q 10) 100 MG CAPS Take 100 mg by mouth every evening.    [provider]  desipramine (NOPRAMIN) 100 MG tablet Take 100 mg by mouth at bedtime.  09/04/14   [provider]  doxycycline  (VIBRA -TABS) 100 MG tablet Take 1 tablet (100 mg total) by mouth 2 (two) times daily. 12/17/23   Lowanda Ruddy, MD  enzalutamide  (XTANDI ) 40 MG capsule Take 4 capsules (160 mg  total) by mouth daily. Takes 4 capsules daily 12/17/23   Lowanda Ruddy, MD  famotidine  (PEPCID ) 20 MG tablet One after supper 12/23/23   Diamond Formica, MD  ferrous sulfate 325 (65 FE) MG tablet Take 325 mg by mouth daily.  06/30/14   [provider]  furosemide (LASIX) 20 MG tablet Take 20 mg by mouth daily. 11/30/23   [provider]  hydroxychloroquine (PLAQUENIL) 200 MG tablet Take 200 mg by mouth 2 (two) times daily.    [provider]  Multiple Vitamin (MULTIVITAMIN WITH MINERALS) TABS tablet Take 1 tablet by mouth daily.    [provider]  pantoprazole  (PROTONIX ) 40 MG tablet TAKE 1 TABLET DAILY 30 MINUTES BEFORE BREAKFAST 01/07/24   Eustacio Highman, NP  predniSONE  (DELTASONE ) 10 MG tablet Take  4 each am x 2 days,   2 each am x 2 days,  1 each am x 2 days and stop 12/23/23   Wert, Michael B, MD  rosuvastatin  (CRESTOR ) 40 MG tablet TAKE 1 TABLET DAILY 01/30/23   Croitoru, Mihai, MD  valsartan  (DIOVAN ) 160 MG tablet Take 1 tablet (160 mg total) by mouth daily. 12/23/23   Wert, Michael B, MD  venlafaxine XR (EFFEXOR-XR) 75 MG 24 hr capsule Take 225 mg by mouth daily. 10/03/14   [provider]      Allergies    Penicillins  Review of Systems   Review of Systems  Respiratory:  Positive for shortness of breath.   Neurological:  Positive for dizziness.  All other systems reviewed and are negative.   Physical Exam Updated Vital Signs BP 107/70   Pulse 99   Temp 97.8 F (36.6 C) (Oral)   Resp 19   Ht 6\' 2"  (1.88 m)   Wt 108.9 kg   SpO2 96%   BMI 30.81 kg/m  Physical Exam Vitals and nursing note reviewed.  Constitutional:      Appearance: Normal appearance.  HENT:     Head: Normocephalic and atraumatic.     Nose: Nose normal.     Mouth/Throat:     Mouth: Mucous membranes are moist.  Eyes:     Extraocular Movements: Extraocular movements intact.     Conjunctiva/sclera: Conjunctivae normal.     Pupils: Pupils are equal, round, and  reactive to light.  Cardiovascular:     Rate and Rhythm: Normal rate and regular rhythm.     Pulses: Normal pulses.     Heart sounds: Normal heart sounds. No murmur heard.    No gallop.  Pulmonary:     Effort: Pulmonary effort is normal. Tachypnea present.     Breath sounds: No stridor. Examination of the left-upper field reveals rales. Rales present. No decreased breath sounds or wheezing.  Chest:     Chest wall: No mass or tenderness.  Abdominal:     General: Abdomen is flat. Bowel sounds are normal.     Palpations: Abdomen is soft.  Musculoskeletal:        General: Normal range of motion.     Cervical back: Normal range of motion and neck supple.     Right lower leg: No edema.     Left lower leg: No edema.  Skin:    General: Skin is warm and dry.     Findings: No rash.  Neurological:     General: No focal deficit present.     Mental Status: He is alert and oriented to person, place, and time. Mental status is at baseline.     Cranial Nerves: No cranial nerve deficit.     Motor: No weakness.  Psychiatric:        Mood and Affect: Mood normal.        Behavior: Behavior normal.        Thought Content: Thought content normal.        Judgment: Judgment normal.     ED Results / Procedures / Treatments   Labs (all labs ordered are listed, but only abnormal results are displayed) Labs Reviewed  BASIC METABOLIC PANEL WITH GFR  CBC  HEPATIC FUNCTION PANEL  BRAIN NATRIURETIC PEPTIDE  URINALYSIS, ROUTINE W REFLEX MICROSCOPIC  LACTIC ACID, PLASMA  LACTIC ACID, PLASMA  TROPONIN I (HIGH SENSITIVITY)    EKG None  Radiology No results found.  Procedures Procedures    Medications Ordered in ED Medications - No data to display  ED Course/ Medical Decision Making/ A&P                                 Medical Decision Making Amount and/or Complexity of Data Reviewed Labs: ordered. Radiology: ordered.  Risk Prescription drug management. Decision regarding  hospitalization.   This patient presents to the ED for concern of dyspnea, this involves an extensive number of treatment options, and is a complaint that carries with it  a high risk of complications and morbidity.  The differential diagnosis includes ACS, pulmonary embolus, acute CHF, pneumonia, pleural effusion, pneumothorax, hemothorax   Co morbidities that complicate the patient evaluation  History of PE   Additional history obtained:  Additional history obtained from family External records from outside source obtained and reviewed including records   Lab Tests:  I Ordered, and personally interpreted labs.  The pertinent results include: Leukocytosis, anemia, hyponatremia, normal kidney function and liver function, negative BNP, negative troponin, improving lactic acid, negative viral swab   Imaging Studies ordered:  I ordered imaging studies including CTA of the chest I independently visualized and interpreted imaging which showed left-sided loculated pleural effusion I agree with the radiologist interpretation   Cardiac Monitoring: / EKG:  The patient was maintained on a cardiac monitor.  I personally viewed and interpreted the cardiac monitored which showed an underlying rhythm of: Normal sinus rhythm, no ST/T wave changes, no ischemic changes, no STEMI   Consultations Obtained:  I requested consultation with the pulmonology,  and discussed lab and imaging findings as well as pertinent plan - they recommend: Admission at [facility] for chest tube placement and possible thrombolytics   Problem List / ED Course / Critical interventions / Medication management  Patient does remain stable at this time.  Discussed with patient we will plan for admission to the hospital service given his loculated pleural effusion.  He has been started on antibiotics at this time.  Did start slow fluids to avoid fluid overload for his hyponatremia.  Patient has stable vital signs at this  point with no indication for sepsis.  Did discuss patient case with pulmonology who did recommend admission at [facility] for possible chest tube placement with thrombolytics.  Have discussed patient case with Dr. Osborne Blazer with the hospitalist service who has excepted for admission at this time. I ordered medication including aztreonam, vancomycin , doxycycline  for loculated pleural effusion Reevaluation of the patient after these medicines showed that the patient improved I have reviewed the patients home medicines and have made adjustments as needed   Social Determinants of Health:  None   Test / Admission - Considered:  Admission             Final Clinical Impression(s) / ED Diagnoses Final diagnoses:  None    Rx / DC Orders ED Discharge Orders     None         Roselynn Connors, PA-C 01/14/24 1820    Teddi Favors, DO 01/21/24 1526

## 2024-01-14 NOTE — ED Triage Notes (Signed)
 Pt reports:  SOB Ongoing since April Hx of blood clot Worsening "I feel like I'm going to pass out."

## 2024-01-14 NOTE — ED Notes (Signed)
Pt states he cannot provide urine specimen at this time.

## 2024-01-14 NOTE — Consult Note (Signed)
 NAME:  [name], MRN:  [id number], DOB:  01/14/1956, LOS: 0 ADMISSION DATE:  01/14/2024, CONSULTATION DATE:  01/14/24 REFERRING MD:  Margaree Shark - EM, CHIEF COMPLAINT:  SOB // L pleural effusion   History of Present Illness:  68 yo M PMH PE currently on eliquis , former smoker, metastatic prostate cancer on enzalutamide  and sipuleucel-T , HLD, GERD, HTN who presented to [facility] ED 01/14/24 w CC SOB and dizziness. In ED had a CTA chest which reveals a loculated appearing left pleural effusion, enlarged mediastinal lymph nodes + consolidative L lung changes, no evidence of PE.   PCCM is called in this setting and has recommended admission to medicine with PCCM consultation for management of L pleural effusion and CT placement.  Pertinent Medical History:  PE on Eliquis  HX smoking Metastatic prostate cancer HTN HLD GERD   Significant Hospital Events: Including procedures, antibiotic start and stop dates in addition to other pertinent events   5/22 - [facility] ED w SOB. CTA Chest with loculated appearing left pleural effusion 5/23 - Transferred to [facility] for higher level of care. Eliquis  held, heparin  gtt started.  Interim History / Subjective:  Transferred from [facility] overnight Overall states he has "been better" Tachypneic, taking short/shallow breaths but does not appear markedly uncomfortable States he still feels SOB and has some mild L-sided lateral back pain, occasional R sided intermittent chest pain No other complaints at present Heparin  gtt has been paused since 0800  Objective:   Blood pressure 95/72, pulse (!) 101, temperature 97.6 F (36.4 C), temperature source Oral, resp. rate (!) 33, height 6\' 2"  (1.88 m), weight 108.9 kg, SpO2 97%.       No intake or output data in the 24 hours ending 01/14/24 1815 Filed Weights   01/14/24 1133  Weight: 108.9 kg   Physical Examination: General: Acutely ill-appearing older man in NAD. Sitting up in bed. HEENT: [place]/AT, anicteric sclera, PERRL,  moist mucous membranes. Neuro: Awake, oriented x 4. Responds to verbal stimuli. Following commands consistently. Moves all 4 extremities spontaneously. Strength 5/5 in all 4 extremities.  CV: RRR, no m/g/r. PULM: Breathing mildly tachypneic to mid-20s and shallow but unlabored. Lung fields diminished significantly on L, clear on R. GI: Obese, soft, nontender, nondistended. Normoactive bowel sounds. Extremities: No LE edema noted. Skin: Warm/dry, no rashes. Scattered ecchymosis to bilateral forearms/hands.  Resolved problem List:   Assessment and Plan:   Loculated L pleural effusion  Recent PE, on Eliquis  CTA Chest 5/22 with enlarged mediastinal nodes, abnormal soft tissue thickening medial pleural surface, consolidative changes L lung, moderate loculated L pleural effusion, increasing mediastinal LAD; no evidence of PE. Recent PET with radiotracer activity LUL consolidation + prevascular lymph nodes, as well as noted new pleural effusion. I discussed in detail options for evacuation of L pleural effusion (thoracentesis, pigtail chest tube, open/surgical chest tube) with recommendation for pigtail chest tube at this time, given loculated nature of effusion and possible need for pleural lytic medications. Patient was in agreement with this plan.  - Supplemental O2 support for SpO2  > 90% - Pulmonary hygiene - Plan for CT (likely pigtail, less likely surgical CT) today 5/23 - Eliquis  transitioned to heparin  gtt in anticipation of procedure - Heparin  gtt held 0800 5/23, can resume post-procedure - F/u pleural fluid studies + path/cyto - Continue broad-spectrum antibiotics (aztreonam, Flagyl , vanc; s/p doxy x 1 dose 5/22)  Best Practice: (right click and "Reselect all SmartList Selections" daily)   Per Primary Team  Labs  CBC: Recent Labs  Lab 01/14/24 1200  WBC 11.8*  HGB 9.8*  HCT 30.5*  MCV 98.4  PLT 400   Basic Metabolic Panel: Recent Labs  Lab 01/14/24 1200  NA 129*  K  4.1  CL 96*  CO2 20*  GLUCOSE 134*  BUN 14  CREATININE 0.90  CALCIUM  8.9   GFR: Estimated Creatinine Clearance: 103.2 mL/min (by C-G formula based on SCr of 0.9 mg/dL). Recent Labs  Lab 01/14/24 1200 01/14/24 1209 01/14/24 1423  WBC 11.8*  --   --   LATICACIDVEN  --  2.0* 1.2   Liver Function Tests: Recent Labs  Lab 01/14/24 1200  AST 28  ALT 20  ALKPHOS 82  BILITOT 0.3  PROT 7.5  ALBUMIN 2.8*   No results for input(s): "LIPASE", "AMYLASE" in the last 168 hours. No results for input(s): "AMMONIA" in the last 168 hours.  ABG:    Component Value Date/Time   PHART 7.344 (L) 11/26/2010 0919   PCO2ART 41.5 11/26/2010 0919   PO2ART 79.0 (L) 11/26/2010 0919   HCO3 22.6 11/26/2010 0919   TCO2 24 11/26/2010 0919   ACIDBASEDEF 3.0 (H) 11/26/2010 0919   O2SAT 95.0 11/26/2010 0919    Coagulation Profile: No results for input(s): "INR", "PROTIME" in the last 168 hours.  Cardiac Enzymes: No results for input(s): "CKTOTAL", "CKMB", "CKMBINDEX", "TROPONINI" in the last 168 hours.  HbA1C: No results found for: "HGBA1C"  CBG: No results for input(s): "GLUCAP" in the last 168 hours.  Review of Systems:   Review of systems completed with pertinent positives/negatives outlined in above HPI.  Past Medical History:  He,  has a past medical history of Anemia, Anxiety, Arthritis, Bladder stone, Cancer (HCC) (2007), COPD (chronic obstructive pulmonary disease) (HCC), Decreased pulse (08/16/2008), Depression, GERD (gastroesophageal reflux disease), Hypercholesteremia, Hypertension, Mental disorder, Pre-diabetes, Shortness of breath, Sleep apnea, Tobacco abuse, and Urinary incontinence.   Surgical History:   Past Surgical History:  Procedure Laterality Date   ANTERIOR LAT LUMBAR FUSION N/A 11/17/2012   Procedure: XLIF L3 - L4 POSTERIOR L3 -L4 FUSION INSTRUMENTATION/L2 - L3 DECOMPRESSION (REVISION OF POSTERIOR HARDWARE) 1 LEVEL;  Surgeon: Mort Ards, MD;  Location: [facility] OR;   Service: Orthopedics;  Laterality: N/A;  procedure #1: start 0923, end 1057.    BACK SURGERY  2010 / 2014   BLADDER SUSPENSION     x 2   CARDIAC CATHETERIZATION     no PCI   CARDIOVASCULAR STRESS TEST  08/16/2008   EF 57%; inferior region has diaphragmatic attenuation, otherwise normal perfusion w/o evidence of ischemia or infarct; LV normal in size; no scintographic evidence of inducible ischemia; no ECG changes, negative for ischemia; low risk scan   CERVICAL FUSION     COLON RESECTION N/A 07/12/2013   peritoneal biopsy only, evidence of metastatic prostate cancer   COLONOSCOPY     COLONOSCOPY N/A 05/27/2013   RMR: Extrinsic mass effect at the level of the appendiceal orifice likely represents an appendiceal or periappendiceal process. Neovascular changes of the rectal mucosa consistent with prior history of radiation treatment. Multiple 3-5 mm polyps removed, several tubular adenomas. Diverticulosis.nex tcs 2019   COLONOSCOPY WITH PROPOFOL  N/A 07/05/2018   Procedure: COLONOSCOPY WITH PROPOFOL ;  Surgeon: Suzette Espy, MD;  Location: [facility];  Service: Endoscopy;  Laterality: N/A;  9:30am   COLONOSCOPY WITH PROPOFOL  N/A 03/07/2021   Procedure: COLONOSCOPY WITH PROPOFOL ;  Surgeon: Suzette Espy, MD;  Location: [facility];  Service: Endoscopy;  Laterality: N/A;  2:30pm   CYSTOSCOPY W/ URETERAL STENT PLACEMENT N/A 10/03/2013   Procedure: CYSTOSCOPY AND REMOVAL OF BLADDER NECK STONE ;  Surgeon: Kristeen Peto, MD;  Location: [facility];  Service: Urology;  Laterality: N/A;  WITH REMOVAL OF BLADDER NECK STONE     ESOPHAGOGASTRODUODENOSCOPY (EGD) WITH ESOPHAGEAL DILATION N/A 05/27/2013   RMR: Mild erosive reflux esophagitis with soft noncritical appearing stricture status post Maloney dilation, antral erosions with reactive gastropathy but no H. pylori   ESOPHAGOGASTRODUODENOSCOPY (EGD) WITH PROPOFOL  N/A 07/05/2018   Procedure: ESOPHAGOGASTRODUODENOSCOPY (EGD) WITH PROPOFOL ;  Surgeon: Suzette Espy, MD;  Location: [facility];  Service: Endoscopy;  Laterality: N/A;   ESOPHAGOGASTRODUODENOSCOPY (EGD) WITH PROPOFOL  N/A 02/05/2023   Procedure: ESOPHAGOGASTRODUODENOSCOPY (EGD) WITH PROPOFOL ;  Surgeon: Suzette Espy, MD;  Location: [facility];  Service: Endoscopy;  Laterality: N/A;  11:30 am, asa 3   HOLMIUM LASER APPLICATION N/A 10/03/2013   Procedure: HOLMIUM LASER APPLICATION;  Surgeon: Kristeen Peto, MD;  Location: [facility];  Service: Urology;  Laterality: N/A;   IR FLUORO GUIDE CV LINE RIGHT  10/08/2023   IR REMOVAL TUN CV CATH W/O FL  11/13/2023   IR US  GUIDE VASC ACCESS RIGHT  10/08/2023   MALONEY DILATION N/A 07/05/2018   Procedure: Londa Rival DILATION;  Surgeon: Suzette Espy, MD;  Location: [facility];  Service: Endoscopy;  Laterality: N/A;   MALONEY DILATION N/A 02/05/2023   Procedure: Londa Rival DILATION;  Surgeon: Suzette Espy, MD;  Location: [facility];  Service: Endoscopy;  Laterality: N/A;   POLYPECTOMY  07/05/2018   Procedure: POLYPECTOMY;  Surgeon: Suzette Espy, MD;  Location: [facility];  Service: Endoscopy;;  cecal polyp , splenic flexure polyps x4   POLYPECTOMY  03/07/2021   Procedure: POLYPECTOMY;  Surgeon: Suzette Espy, MD;  Location: [facility];  Service: Endoscopy;;   PROSTATE SURGERY     TRANSTHORACIC ECHOCARDIOGRAM  08/16/2008   essentially normal for age   URINARY SPHINCTER IMPLANT N/A 01/31/2014   Procedure: IMPLANTATION ARTIFICIAL SPHINCTER  WITH CYSTOSCOPY;  Surgeon: Devorah Fonder, MD;  Location: [facility];  Service: Urology;  Laterality: N/A;   WRIST SURGERY Right     Social History:   reports that he quit smoking about 10 years ago. His smoking use included cigarettes. He started smoking about 53 years ago. He has a 10.8 pack-year smoking history. He quit smokeless tobacco use about 43 years ago. He reports that he does not drink alcohol and does not use drugs.   Family History:  His family history includes Cancer in his paternal uncle;  Colon cancer in his cousin; Colon polyps in his father and sister; Diabetes in his brother, father, and mother; Heart disease in his mother; Hypertension in his brother, father, and mother; Prostate cancer (age of onset: 45) in his cousin.   Allergies: Allergies  Allergen Reactions   Penicillins Anaphylaxis         Home Medications: Prior to Admission medications   Medication Sig Start Date End Date Taking? Authorizing Provider  albuterol  (VENTOLIN  HFA) 108 (90 Base) MCG/ACT inhaler Inhale 2 puffs into the lungs every 4 (four) hours as needed. 12/07/23   [provider]  apixaban  (ELIQUIS ) 5 MG TABS tablet 2 po bid thru 4/15, then 1 po BID 12/02/23   Johnson, Clanford L, MD  ARIPiprazole  (ABILIFY ) 10 MG tablet Take 10 mg by mouth at bedtime. 04/13/18   [provider]  Choline Fenofibrate  135 MG capsule Take 135 mg  by mouth at bedtime. trilipix    [provider]  Coenzyme Q10 (CO Q 10) 100 MG CAPS Take 100 mg by mouth every evening.    [provider]  desipramine (NOPRAMIN) 100 MG tablet Take 100 mg by mouth at bedtime.  09/04/14   [provider]  doxycycline  (VIBRA -TABS) 100 MG tablet Take 1 tablet (100 mg total) by mouth 2 (two) times daily. 12/17/23   Lowanda Ruddy, MD  enzalutamide  (XTANDI ) 40 MG capsule Take 4 capsules (160 mg total) by mouth daily. Takes 4 capsules daily 12/17/23   Lowanda Ruddy, MD  famotidine  (PEPCID ) 20 MG tablet One after supper 12/23/23   Wert, Michael B, MD  ferrous sulfate 325 (65 FE) MG tablet Take 325 mg by mouth daily.  06/30/14   [provider]  furosemide (LASIX) 20 MG tablet Take 20 mg by mouth daily. 11/30/23   [provider]  hydroxychloroquine (PLAQUENIL) 200 MG tablet Take 200 mg by mouth 2 (two) times daily.    [provider]  Multiple Vitamin (MULTIVITAMIN WITH MINERALS) TABS tablet Take 1 tablet by mouth daily.    [provider]  pantoprazole  (PROTONIX ) 40 MG tablet  TAKE 1 TABLET DAILY 30 MINUTES BEFORE BREAKFAST 01/07/24   Eustacio Highman, NP  predniSONE  (DELTASONE ) 10 MG tablet Take  4 each am x 2 days,   2 each am x 2 days,  1 each am x 2 days and stop 12/23/23   Wert, Michael B, MD  rosuvastatin  (CRESTOR ) 40 MG tablet TAKE 1 TABLET DAILY 01/30/23   Croitoru, Mihai, MD  valsartan  (DIOVAN ) 160 MG tablet Take 1 tablet (160 mg total) by mouth daily. 12/23/23   Wert, Michael B, MD  venlafaxine XR (EFFEXOR-XR) 75 MG 24 hr capsule Take 225 mg by mouth daily. 10/03/14   [provider]    Signature:   Star East, PA-C [facility] 01/15/24 9:19 AM  Please see [email or web address] for pager details.  From 7A-7P if no response, please call [phone] After hours, please call ELink [phone]

## 2024-01-14 NOTE — Progress Notes (Signed)
 Pharmacy Antibiotic Note  [name] is a 68 y.o. male  w/ PMH of HTN, HLD, GERD, metastatic prostate cancer, h/o PE (started on Eliquis ) admitted on 01/14/2024 with pneumonia.  Pharmacy has been consulted for vancomycin  and aztreonam  dosing.  Plan:  1) start vancomycin  2000 mg IV x 1 (1000 mg previously administered) then 1000 mg every 12 hours Goal AUC 400-550. Expected AUC: 514.7 SCr used: 0.90 mg/dL  2) start aztreonam  2 grams IV every 8 hours   Height: 6\' 2"  (188 cm) Weight: 108.9 kg (240 lb) IBW/kg (Calculated) : 82.2  Temp (24hrs), Avg:97.7 F (36.5 C), Min:97.6 F (36.4 C), Max:97.8 F (36.6 C)  Recent Labs  Lab 01/14/24 1200 01/14/24 1209 01/14/24 1423  WBC 11.8*  --   --   CREATININE 0.90  --   --   LATICACIDVEN  --  2.0* 1.2    Estimated Creatinine Clearance: 103.2 mL/min (by C-G formula based on SCr of 0.9 mg/dL).    Allergies  Allergen Reactions   Penicillins Anaphylaxis         Antimicrobials this admission: 05/22 vancomycin  >>  05/22 aztreonam  >>   Microbiology results: 05/22 BCx: pending 05/22 MRSA PCR: pending  Thank you for allowing pharmacy to be a part of this patient's care.  [name] 01/14/2024 8:22 PM

## 2024-01-14 NOTE — Telephone Encounter (Signed)
 Spoke with patient, he is going to [facility] ED shortly. Concurred with triage nurse recommendation. Advised patient we would let Dr. Waymond Hailey know he is going to ED. Nothing further needed at this time.    Dr. Waymond Hailey - FYI - thanks!

## 2024-01-14 NOTE — H&P (Signed)
 [facility] H&P   Patient Demographics:    [name], is a 68 y.o. male  MRN: [id number]   DOB - 09-28-1955  Admit Date - 01/14/2024  Outpatient Primary MD for the patient is [name], MD  Referring MD/NP/PA: PA Rigney  Outpatient Specialists: oncology Dr Cardell Chang    Patient coming from: home  Chief Complaint  Patient presents with   Shortness of Breath   Dizziness      HPI:    [name]  is a 67 y.o. male,  medical history significant of hypertension, hyperlipidemia, GERD, metastatic prostate cancer by Dr. [name], on enzalutamide  and sipuleucel-T , recent hospitalization where he was diagnosed with PE, started on Eliquis , presents to ED secondary to shortness of breath, fatigue, dizziness, reports lightheadedness, worsening dyspnea over the last month, denies fever, chills, nausea, vomiting or diarrhea, no lower extremity edema, no hemoptysis. - In ED he had lactic acid at 2, white blood cell count at 11.8, CTA chest obtained, no evidence of PE, but significant for left lung base consolidation with significant loculated pleural effusion,.  Hospitalist consulted to admit    Review of systems:      A full 10 point Review of Systems was done, except as stated above, all other Review of Systems were negative.   With Past History of the following :    Past Medical History:  Diagnosis Date   Anemia    Anxiety    Arthritis    Bladder stone    Cancer (HCC) 2007   prostate CA   COPD (chronic obstructive pulmonary disease) (HCC)    Decreased pulse 08/16/2008   LE doppler - bilateral ABIs normal, bilateral PVRs normal waveform; bilateral common iliac arteries difficult to visualize however velocities suggest less than 50% diameter reduction; bilateral LE normal velocities w/o evidence of diameter reduction   Depression    GERD (gastroesophageal reflux disease)     Hypercholesteremia    Hypertension    Mental disorder    PTSD   Pre-diabetes    Shortness of breath    Sleep apnea    wears CPAP nightly, settings at 14    Tobacco abuse    Urinary incontinence    due to prostate removal      Past Surgical History:  Procedure Laterality Date   ANTERIOR LAT LUMBAR FUSION N/A 11/17/2012   Procedure: XLIF L3 - L4 POSTERIOR L3 -L4 FUSION INSTRUMENTATION/L2 - L3 DECOMPRESSION (REVISION OF POSTERIOR HARDWARE) 1 LEVEL;  Surgeon: Mort Ards, MD;  Location: [facility];  Service: Orthopedics;  Laterality: N/A;  procedure #1: start 0923, end 1057.    BACK SURGERY  2010 / 2014   BLADDER SUSPENSION     x 2   CARDIAC CATHETERIZATION     no PCI   CARDIOVASCULAR STRESS TEST  08/16/2008   EF 57%; inferior region has diaphragmatic attenuation, otherwise normal perfusion w/o evidence of  ischemia or infarct; LV normal in size; no scintographic evidence of inducible ischemia; no ECG changes, negative for ischemia; low risk scan   CERVICAL FUSION     COLON RESECTION N/A 07/12/2013   peritoneal biopsy only, evidence of metastatic prostate cancer   COLONOSCOPY     COLONOSCOPY N/A 05/27/2013   RMR: Extrinsic mass effect at the level of the appendiceal orifice likely represents an appendiceal or periappendiceal process. Neovascular changes of the rectal mucosa consistent with prior history of radiation treatment. Multiple 3-5 mm polyps removed, several tubular adenomas. Diverticulosis.nex tcs 2019   COLONOSCOPY WITH PROPOFOL  N/A 07/05/2018   Procedure: COLONOSCOPY WITH PROPOFOL ;  Surgeon: Suzette Espy, MD;  Location: [facility];  Service: Endoscopy;  Laterality: N/A;  9:30am   COLONOSCOPY WITH PROPOFOL  N/A 03/07/2021   Procedure: COLONOSCOPY WITH PROPOFOL ;  Surgeon: Suzette Espy, MD;  Location: [facility];  Service: Endoscopy;  Laterality: N/A;  2:30pm   CYSTOSCOPY W/ URETERAL STENT PLACEMENT N/A 10/03/2013   Procedure: CYSTOSCOPY AND REMOVAL OF BLADDER NECK STONE ;   Surgeon: Kristeen Peto, MD;  Location: [facility];  Service: Urology;  Laterality: N/A;  WITH REMOVAL OF BLADDER NECK STONE     ESOPHAGOGASTRODUODENOSCOPY (EGD) WITH ESOPHAGEAL DILATION N/A 05/27/2013   RMR: Mild erosive reflux esophagitis with soft noncritical appearing stricture status post Maloney dilation, antral erosions with reactive gastropathy but no H. pylori   ESOPHAGOGASTRODUODENOSCOPY (EGD) WITH PROPOFOL  N/A 07/05/2018   Procedure: ESOPHAGOGASTRODUODENOSCOPY (EGD) WITH PROPOFOL ;  Surgeon: Suzette Espy, MD;  [facility]: [facility];  Service: Endoscopy;  Laterality: N/A;   ESOPHAGOGASTRODUODENOSCOPY (EGD) WITH PROPOFOL  N/A 02/05/2023   Procedure: ESOPHAGOGASTRODUODENOSCOPY (EGD) WITH PROPOFOL ;  Surgeon: Suzette Espy, MD;  [facility]: [facility];  Service: Endoscopy;  Laterality: N/A;  11:30 am, asa 3   HOLMIUM LASER APPLICATION N/A 10/03/2013   Procedure: HOLMIUM LASER APPLICATION;  Surgeon: Kristeen Peto, MD;  Location: [facility];  Service: Urology;  Laterality: N/A;   IR FLUORO GUIDE CV LINE RIGHT  10/08/2023   IR REMOVAL TUN CV CATH W/O FL  11/13/2023   IR US  GUIDE VASC ACCESS RIGHT  10/08/2023   MALONEY DILATION N/A 07/05/2018   Procedure: Londa Rival DILATION;  Surgeon: Suzette Espy, MD;  [facility]: [facility];  Service: Endoscopy;  Laterality: N/A;   MALONEY DILATION N/A 02/05/2023   Procedure: Londa Rival DILATION;  Surgeon: Suzette Espy, MD;  [facility]: [facility];  Service: Endoscopy;  Laterality: N/A;   POLYPECTOMY  07/05/2018   Procedure: POLYPECTOMY;  Surgeon: Suzette Espy, MD;  [facility];  Service: Endoscopy;;  cecal polyp , splenic flexure polyps x4   POLYPECTOMY  03/07/2021   Procedure: POLYPECTOMY;  Surgeon: Suzette Espy, MD;  [facility]: [facility];  Service: Endoscopy;;   PROSTATE SURGERY     TRANSTHORACIC ECHOCARDIOGRAM  08/16/2008   essentially normal for age   URINARY SPHINCTER IMPLANT N/A 01/31/2014   Procedure: IMPLANTATION ARTIFICIAL SPHINCTER  WITH  CYSTOSCOPY;  Surgeon: Devorah Fonder, MD;  Location: [facility];  Service: Urology;  Laterality: N/A;   WRIST SURGERY Right       Social History:     Social History   Tobacco Use   Smoking status: Former    Current packs/day: 0.00    Average packs/day: 0.3 packs/day for 43.0 years (10.8 ttl pk-yrs)    Types: Cigarettes    Start date: 02/28/1970    Quit date: 02/28/2013    Years since quitting:  10.8   Smokeless tobacco: Former    Quit date: 06/30/1980  Substance Use Topics   Alcohol use: No       Family History :     Family History  Problem Relation Age of Onset   Diabetes Mother    Hypertension Mother    Heart disease Mother    Diabetes Father    Hypertension Father    Colon polyps Father        age 46   Colon polyps Sister    Hypertension Brother    Diabetes Brother    Cancer Paternal Uncle        unknown type   Prostate cancer Cousin 76       maternal first cousin, metastatic   Colon cancer Cousin        paternal first cousin     Home Medications:   Prior to Admission medications   Medication Sig Start Date End Date Taking? Authorizing Provider  albuterol  (VENTOLIN  HFA) 108 (90 Base) MCG/ACT inhaler Inhale 2 puffs into the lungs every 4 (four) hours as needed for shortness of breath or wheezing. 12/07/23  Yes [provider]  apixaban  (ELIQUIS ) 5 MG TABS tablet 2 po bid thru 4/15, then 1 po BID Patient taking differently: Take 5 mg by mouth 2 (two) times daily. 12/02/23  Yes Johnson, Clanford L, MD  ARIPiprazole  (ABILIFY ) 10 MG tablet Take 10 mg by mouth at bedtime. 04/13/18  Yes [provider]  Choline Fenofibrate  135 MG capsule Take 135 mg by mouth at bedtime. trilipix   Yes [provider]  Coenzyme Q10 (CO Q 10) 100 MG CAPS Take 100 mg by mouth every evening.   Yes [provider]  desipramine (NOPRAMIN) 100 MG tablet Take 100 mg by mouth at bedtime.  09/04/14  Yes [provider]  enzalutamide  (XTANDI ) 40 MG capsule  Take 4 capsules (160 mg total) by mouth daily. Takes 4 capsules daily 12/17/23  Yes Lowanda Ruddy, MD  famotidine  (PEPCID ) 20 MG tablet One after supper Patient taking differently: Take 20 mg by mouth daily with supper. One after supper 12/23/23  Yes Diamond Formica, MD  ferrous sulfate 325 (65 FE) MG tablet Take 325 mg by mouth daily.  06/30/14  Yes [provider]  furosemide (LASIX) 20 MG tablet Take 20 mg by mouth daily. 11/30/23  Yes [provider]  hydroxychloroquine (PLAQUENIL) 200 MG tablet Take 200 mg by mouth 2 (two) times daily.   Yes [provider]  Multiple Vitamin (MULTIVITAMIN WITH MINERALS) TABS tablet Take 1 tablet by mouth daily.   Yes [provider]  pantoprazole  (PROTONIX ) 40 MG tablet TAKE 1 TABLET DAILY 30 MINUTES BEFORE BREAKFAST Patient taking differently: Take 40 mg by mouth daily before breakfast. 01/07/24  Yes Mahon, Courtney L, NP  rosuvastatin  (CRESTOR ) 40 MG tablet TAKE 1 TABLET DAILY Patient taking differently: Take 40 mg by mouth every evening. 01/30/23  Yes Croitoru, Mihai, MD  valsartan  (DIOVAN ) 160 MG tablet Take 1 tablet (160 mg total) by mouth daily. 12/23/23  Yes Diamond Formica, MD  venlafaxine XR (EFFEXOR-XR) 75 MG 24 hr capsule Take 225 mg by mouth daily with breakfast. 10/03/14  Yes [provider]     Allergies:     Allergies  Allergen Reactions   Penicillins Anaphylaxis          Physical Exam:   Vitals  Blood pressure 124/73, pulse (!) 101, temperature 97.6 F (36.4 C), temperature source  Oral, resp. rate (!) 41, height 6\' 2"  (1.88 m), weight 108.9 kg, SpO2 98%.   1. General Frail, elderly male, laying in bed no apparent distress  2. Normal affect and insight, Not Suicidal or Homicidal, Awake Alert, Oriented X 3.  3. No F.N deficits, ALL C.Nerves Intact, Strength 5/5 all 4 extremities, Sensation intact all 4 extremities, Plantars down going.  4. Ears and Eyes appear Normal, Conjunctivae clear,  PERRLA. Moist Oral Mucosa.  5. Supple Neck, No JVD, No cervical lymphadenopathy appriciated, No Carotid Bruits.  6. Symmetrical Chest wall movement, left lung diminished air entry with Rales, tachypneic  7.  Tachycardic, No Gallops, Rubs or Murmurs, No Parasternal Heave.  8. Positive Bowel Sounds, Abdomen Soft, No tenderness, No organomegaly appriciated,No rebound -guarding or rigidity.  9.  No Cyanosis, Normal Skin Turgor, No Skin Rash or Bruise.  10. Good muscle tone,  joints appear normal , no effusions, Normal ROM.     Data Review:    CBC Recent Labs  Lab 01/14/24 1200  WBC 11.8*  HGB 9.8*  HCT 30.5*  PLT 400  MCV 98.4  MCH 31.6  MCHC 32.1  RDW 14.2   ------------------------------------------------------------------------------------------------------------------  Chemistries  Recent Labs  Lab 01/14/24 1200  NA 129*  K 4.1  CL 96*  CO2 20*  GLUCOSE 134*  BUN 14  CREATININE 0.90  CALCIUM  8.9  AST 28  ALT 20  ALKPHOS 82  BILITOT 0.3   ------------------------------------------------------------------------------------------------------------------ estimated creatinine clearance is 103.2 mL/min (by C-G formula based on SCr of 0.9 mg/dL). ------------------------------------------------------------------------------------------------------------------ No results for input(s): "TSH", "T4TOTAL", "T3FREE", "THYROIDAB" in the last 72 hours.  Invalid input(s): "FREET3"  Coagulation profile No results for input(s): "INR", "PROTIME" in the last 168 hours. ------------------------------------------------------------------------------------------------------------------- No results for input(s): "DDIMER" in the last 72 hours. -------------------------------------------------------------------------------------------------------------------  Cardiac Enzymes No results for input(s): "CKMB", "TROPONINI", "MYOGLOBIN" in the last 168 hours.  Invalid input(s):  "CK" ------------------------------------------------------------------------------------------------------------------    Component Value Date/Time   BNP 28.0 01/14/2024 1200     ---------------------------------------------------------------------------------------------------------------  Urinalysis    Component Value Date/Time   COLORURINE YELLOW 11/17/2009 1655   APPEARANCEUR HAZY (A) 11/17/2009 1655   LABSPEC 1.020 11/17/2009 1655   PHURINE 5.5 11/17/2009 1655   GLUCOSEU NEGATIVE 11/17/2009 1655   HGBUR NEGATIVE 11/17/2009 1655   BILIRUBINUR NEGATIVE 11/17/2009 1655   KETONESUR NEGATIVE 11/17/2009 1655   PROTEINUR NEGATIVE 11/17/2009 1655   UROBILINOGEN 0.2 11/17/2009 1655   NITRITE NEGATIVE 11/17/2009 1655   LEUKOCYTESUR  11/17/2009 1655    NEGATIVE MICROSCOPIC NOT DONE ON URINES WITH NEGATIVE PROTEIN, BLOOD, LEUKOCYTES, NITRITE, OR GLUCOSE <1000 mg/dL.    ----------------------------------------------------------------------------------------------------------------   Imaging Results:    CT Angio Chest PE W/Cm &/Or Wo Cm Result Date: 01/14/2024 CLINICAL DATA:  Pulmonary embolism EXAM: CT ANGIOGRAPHY CHEST WITH CONTRAST TECHNIQUE: Multidetector CT imaging of the chest was performed using the standard protocol during bolus administration of intravenous contrast. Multiplanar CT image reconstructions and MIPs were obtained to evaluate the vascular anatomy. Multiplanar image (3D post-processing) reconstructions and MIPs were obtained to evaluate the vascular anatomy. RADIATION DOSE REDUCTION: This exam was performed according to the departmental dose-optimization program which includes automated exposure control, adjustment of the mA and/or kV according to patient size and/or use of iterative reconstruction technique. CONTRAST:  75mL OMNIPAQUE  IOHEXOL  350 MG/ML SOLN COMPARISON:  CT of the chest performed December 01, 2023. PET-CT performed Dec 31, 2023 FINDINGS: Cardiovascular: Normal  size heart. No pericardial effusion. Three vessel aortic arch. The main pulmonary  artery is within normal limits for size. No evidence of pulmonary embolism to the proximal segmental level. Mediastinum/Nodes: Enlarged lymph nodes are present within the mediastinum. A representative lymph node anterior to the thoracic aorta measures 1.4 cm in short axis dimension on image 37 of series 4. Soft tissue thickening is also increased along the left medial pleural surface, left mediastinum and left hilum. Lungs/Pleura: There is a loculated left-sided pleural effusion. This is moderate in size and worst in the left lung base. Consolidative changes are present in the left lung particularly in the medial left upper lobe and left lower lobe. Underlying changes of chronic lung disease are present with cystic lucencies which are upper lobe and peripheral predominant. Peribronchial thickening is also noted. Upper Abdomen: No acute abnormality. Musculoskeletal: No chest wall abnormality. No acute or significant osseous findings. Review of the MIP images confirms the above findings. IMPRESSION: 1. Increase in abnormal soft tissue thickening along the medial pleural surface of the left lung. 2. Moderate loculated left pleural effusion. 3. Increasing mediastinal lymphadenopathy. 4. No evidence of pulmonary embolism to the proximal segmental level. 5. Scattered consolidative changes are also present in the left lung which may represent superimposed infection. Electronically Signed   By: Reagan Camera M.D.   On: 01/14/2024 16:56   DG Chest Port 1 View Result Date: 01/14/2024 CLINICAL DATA:  Shortness of breath, dizziness. EXAM: PORTABLE CHEST 1 VIEW COMPARISON:  December 01, 2023. FINDINGS: Stable cardiomediastinal silhouette. Stable minimal right basilar scarring or subsegmental atelectasis. Increased left basilar opacity is noted concerning for worsening atelectasis or pneumonia with small left pleural effusion. Bony thorax is  unremarkable. IMPRESSION: Increased left basilar opacity is noted concerning for worsening atelectasis or pneumonia with small left pleural effusion. Electronically Signed   By: Rosalene Colon M.D.   On: 01/14/2024 13:56       Assessment & Plan:    Principal Problem:   Loculated pleural effusion Active Problems:   Essential hypertension   Obesity (BMI 30-39.9)   COPD (chronic obstructive pulmonary disease) with chronic bronchitis (HCC)   GERD (gastroesophageal reflux disease)   Type 2 diabetes mellitus with obesity (HCC)   Prostate cancer metastatic to bone (HCC)   Pulmonary embolism (HCC)  Loculated left pleural effusion Sepsis due to HCAP - Sepsis present on admission, as elevated lactic acid leukocytosis, tachypneic and tachycardic - High concern for aspiration, as patient and wife at bedside reports significant coughing/choking when eating, will keep on dysphagia 1, nectar thick and will consult SLP. - Will keep on broad-spectrum antibiotic, vancomycin , Azactam and Flagyl  - Courage use incentive spirometry and flutter valve. - At with significant loculated pleural effusion, likely will need chest tube with thrombolytics, so will be admitted to [facility] for PCCM evaluation   pulmonary embolism -During recent hospitalization, he is on Eliquis , compliant, last dose this morning, will transition to heparin  drip as likely will need procedure for his loculated pleural effusion   Prolonged QT interval QTc at 510s, will hold all prolonging agents for now oozing Abilify , Plaquenil, Effexor and desipramine   Essential hypertension Resume home meds   Mixed hyperlipidemia Continue Crestor , fenofibrate    GERD Continue Protonix    Metastatic prostate cancer Patient follows with Dr. [name], [name] For now we will hold his Xtandi  due to acute infection  Rheumatoid  arthritis -hold Plaquenil due to prolonged QTc     DVT Prophylaxis Heparin  drip  AM Labs Ordered, also please  review Full Orders  Family Communication: Admission, patients  condition and plan of care including tests being ordered have been discussed with the patient and wife who indicate understanding and agree with the plan and Code Status.  Code Status full code  Likely DC to home  Consults called: Pulmonary at [facility]  Admission status: Inpatient  Time spent in minutes : 75 minutes   Seena Dadds M.D on 01/14/2024 at 8:02 PM   [facility] - Office  [phone]

## 2024-01-14 NOTE — Progress Notes (Signed)
 PHARMACY - ANTICOAGULATION CONSULT NOTE  Pharmacy Consult for heparin  infusion Indication: h/o pulmonary embolus  Allergies  Allergen Reactions   Penicillins Anaphylaxis         Patient Measurements: Height: 6\' 2"  (188 cm) Weight: 108.9 kg (240 lb) IBW/kg (Calculated) : 82.2 HEPARIN  DW (KG): 104.6  Vital Signs: Temp: 97.6 F (36.4 C) (05/22 1545) Temp Source: Oral (05/22 1545) BP: 124/73 (05/22 1930) Pulse Rate: 101 (05/22 1930)  Labs: Recent Labs    01/14/24 1200  HGB 9.8*  HCT 30.5*  PLT 400  CREATININE 0.90  TROPONINIHS 5    Estimated Creatinine Clearance: 103.2 mL/min (by C-G formula based on SCr of 0.9 mg/dL).   Medical History: Past Medical History:  Diagnosis Date   Anemia    Anxiety    Arthritis    Bladder stone    Cancer (HCC) 2007   prostate CA   COPD (chronic obstructive pulmonary disease) (HCC)    Decreased pulse 08/16/2008   LE doppler - bilateral ABIs normal, bilateral PVRs normal waveform; bilateral common iliac arteries difficult to visualize however velocities suggest less than 50% diameter reduction; bilateral LE normal velocities w/o evidence of diameter reduction   Depression    GERD (gastroesophageal reflux disease)    Hypercholesteremia    Hypertension    Mental disorder    PTSD   Pre-diabetes    Shortness of breath    Sleep apnea    wears CPAP nightly, settings at 14    Tobacco abuse    Urinary incontinence    due to prostate removal    Assessment: 68 y.o. male w/ PMH of HTN, HLD, GERD, metastatic prostate cancer, h/o PE (started on Eliquis ), presents to ED secondary to shortness of breath, fatigue, dizziness, reports lightheadedness, worsening dyspnea over the last month, denies fever, chills, nausea, vomiting or diarrhea. Last dose of apixaban  this am at 0800 (transitioning to heparin  drip as likely will need procedure for his loculated pleural effusion )  Baseline Labs: Hgb 9.8/PLT wnl, INR/aPTT pending  Goal of Therapy:   anti-Xa level 0.3-0.7 units/ml aPTT 66 - 102 seconds Monitor platelets by anticoagulation protocol: Yes   Plan:  ---Start heparin  infusion at 1700 units/hr (bolus deferred ISO recent apixaban  administration) ---Check aPTT level in 8 hours (we will use aPTT to guide therapy until aPTT and anti-Xa level correlate) ---Continue to monitor H&H and platelets  [name] 01/14/2024,8:13 PM

## 2024-01-15 ENCOUNTER — Telehealth: Payer: Self-pay

## 2024-01-15 ENCOUNTER — Inpatient Hospital Stay

## 2024-01-15 ENCOUNTER — Encounter (HOSPITAL_COMMUNITY)
Admission: EM | Disposition: A | Discharge status: Home Health | Payer: Self-pay | Source: Home / Self Care | Attending: Internal Medicine

## 2024-01-15 ENCOUNTER — Inpatient Hospital Stay (HOSPITAL_COMMUNITY)

## 2024-01-15 DIAGNOSIS — J90 Pleural effusion, not elsewhere classified: Principal | ICD-10-CM

## 2024-01-15 DIAGNOSIS — C7951 Secondary malignant neoplasm of bone: Secondary | ICD-10-CM | POA: Diagnosis not present

## 2024-01-15 DIAGNOSIS — Z86711 Personal history of pulmonary embolism: Secondary | ICD-10-CM | POA: Diagnosis not present

## 2024-01-15 DIAGNOSIS — R918 Other nonspecific abnormal finding of lung field: Secondary | ICD-10-CM | POA: Diagnosis not present

## 2024-01-15 DIAGNOSIS — C61 Malignant neoplasm of prostate: Secondary | ICD-10-CM

## 2024-01-15 HISTORY — PX: CHEST TUBE INSERTION: SHX231

## 2024-01-15 LAB — CBC
HCT: 28.7 % — ABNORMAL LOW (ref 39.0–52.0)
Hemoglobin: 9.4 g/dL — ABNORMAL LOW (ref 13.0–17.0)
MCH: 31.3 pg (ref 26.0–34.0)
MCHC: 32.8 g/dL (ref 30.0–36.0)
MCV: 95.7 fL (ref 80.0–100.0)
Platelets: 402 10*3/uL — ABNORMAL HIGH (ref 150–400)
RBC: 3 MIL/uL — ABNORMAL LOW (ref 4.22–5.81)
RDW: 14 % (ref 11.5–15.5)
WBC: 11.2 10*3/uL — ABNORMAL HIGH (ref 4.0–10.5)
nRBC: 0 % (ref 0.0–0.2)

## 2024-01-15 LAB — BODY FLUID CELL COUNT WITH DIFFERENTIAL
Eos, Fluid: 5 %
Lymphs, Fluid: 18 %
Monocyte-Macrophage-Serous Fluid: 34 % — ABNORMAL LOW (ref 50–90)
Neutrophil Count, Fluid: 43 % — ABNORMAL HIGH (ref 0–25)
Total Nucleated Cell Count, Fluid: 1144 uL — ABNORMAL HIGH (ref 0–1000)

## 2024-01-15 LAB — PROTEIN, PLEURAL OR PERITONEAL FLUID: Total protein, fluid: 4.2 g/dL

## 2024-01-15 LAB — BASIC METABOLIC PANEL WITH GFR
Anion gap: 10 (ref 5–15)
BUN: 6 mg/dL — ABNORMAL LOW (ref 8–23)
CO2: 21 mmol/L — ABNORMAL LOW (ref 22–32)
Calcium: 8.6 mg/dL — ABNORMAL LOW (ref 8.9–10.3)
Chloride: 99 mmol/L (ref 98–111)
Creatinine, Ser: 0.69 mg/dL (ref 0.61–1.24)
GFR, Estimated: >60 mL/min (ref >60)
Glucose, Bld: 149 mg/dL — ABNORMAL HIGH (ref 70–99)
Potassium: 4.2 mmol/L (ref 3.5–5.1)
Sodium: 130 mmol/L — ABNORMAL LOW (ref 135–145)

## 2024-01-15 LAB — APTT: aPTT: 53 s — ABNORMAL HIGH (ref 24–36)

## 2024-01-15 LAB — LACTATE DEHYDROGENASE, PLEURAL OR PERITONEAL FLUID: LD, Fluid: 504 U/L — ABNORMAL HIGH (ref 3–23)

## 2024-01-15 LAB — GLUCOSE, PLEURAL OR PERITONEAL FLUID: Glucose, Fluid: 135 mg/dL

## 2024-01-15 LAB — LACTATE DEHYDROGENASE: LDH: 178 U/L (ref 98–192)

## 2024-01-15 LAB — MRSA NEXT GEN BY PCR, NASAL: MRSA by PCR Next Gen: NOT DETECTED

## 2024-01-15 LAB — PROTEIN, TOTAL: Total Protein: 6.2 g/dL — ABNORMAL LOW (ref 6.5–8.1)

## 2024-01-15 LAB — HEPARIN LEVEL (UNFRACTIONATED): Heparin Unfractionated: 0.59 [IU]/mL (ref 0.30–0.70)

## 2024-01-15 SURGERY — CHEST TUBE INSERTION
Anesthesia: Choice | Laterality: Left

## 2024-01-15 MED ORDER — OXYCODONE HCL 5 MG PO TABS
5.0000 mg | ORAL_TABLET | Freq: Four times a day (QID) | ORAL | Status: DC | PRN
  Administered 2024-01-15 – 2024-01-18 (×5): 5 mg via ORAL
  Filled 2024-01-15 (×5): qty 1

## 2024-01-15 MED ORDER — SODIUM CHLORIDE 0.9% FLUSH
10.0000 mL | Freq: Three times a day (TID) | INTRAVENOUS | Status: DC
  Administered 2024-01-15 – 2024-01-16 (×5): 10 mL via INTRAPLEURAL

## 2024-01-15 MED ORDER — FENTANYL CITRATE (PF) 100 MCG/2ML IJ SOLN
INTRAMUSCULAR | Status: AC
  Filled 2024-01-15: qty 2

## 2024-01-15 MED ORDER — FENTANYL CITRATE (PF) 100 MCG/2ML IJ SOLN
25.0000 ug | Freq: Once | INTRAMUSCULAR | Status: AC
  Administered 2024-01-15: 50 ug via INTRAVENOUS

## 2024-01-15 NOTE — Evaluation (Signed)
 Physical Therapy Evaluation Patient Details [name] MRN: [id number] DOB: 1955/12/28 Today's Date: 01/15/2024  History of Present Illness  Pt is a 68 y/o male admitted 01/12/24 with SOB, fatigue, dizziness and found to have L lung base consolidation with loculated effusion.  He underwent chest tube insertion on 01/15/24 with image guidance.  PMH positive for HTN, GERD, metastatic prostate CA, recent PE on Eliquis , GERD, Sleep apnea on CPAP, previous lumbar and cervical spinal surgeries.  Clinical Impression  Patient presents with decreased mobility due to generalized weakness and decreased activity tolerance with decreased  cardiorespiratory endurance.  Patient not on O2 at home, needing 4L O2 with mobility today SpO2 at 94% with moderate dyspnea, pt relates better since chest tube insertion.  Feel he will benefit from skilled PT in the acute setting for progression of activity tolerance to allow d/c home with wife and follow up HHPT.         If plan is discharge home, recommend the following: A little help with walking and/or transfers;Help with stairs or ramp for entrance;Assist for transportation   Can travel by private vehicle        Equipment Recommendations None recommended by PT  Recommendations for Other Services       Functional Status Assessment Patient has had a recent decline in their functional status and demonstrates the ability to make significant improvements in function in a reasonable and predictable amount of time.     Precautions / Restrictions Precautions Precautions: Fall Precaution/Restrictions Comments: watch SpO2      Mobility  Bed Mobility Overal bed mobility: Modified Independent                  Transfers Overall transfer level: Needs assistance Equipment used: Rolling walker (2 wheels) Transfers: Sit to/from Stand Sit to Stand: Supervision           General transfer comment: assist for lines     Ambulation/Gait Ambulation/Gait assistance: Supervision, Contact guard assist Gait Distance (Feet): 100 Feet Assistive device: Rolling walker (2 wheels) Gait Pattern/deviations: Step-through pattern, Decreased stride length, Trunk flexed, Wide base of support       General Gait Details: at times walker getting too far forward and cues for safety, CGA for balance esp on turns  Stairs            Wheelchair Mobility     Tilt Bed    Modified Rankin (Stroke Patients Only)       Balance Overall balance assessment: Needs assistance   Sitting balance-Leahy Scale: Good     Standing balance support: Bilateral upper extremity supported Standing balance-Leahy Scale: Poor Standing balance comment: UE reliant due to flexed posture                             Pertinent Vitals/Pain Pain Assessment Pain Assessment: 0-10 Pain Score: 4  Pain Location: L neck and chest Pain Descriptors / Indicators: Sharp Pain Intervention(s): Monitored during session, RN gave pain meds during session    Home Living Family/patient expects to be discharged to:: Private residence Living Arrangements: Spouse/significant other Available Help at Discharge: Family Type of Home: House Home Access: Stairs to enter Entrance Stairs-Rails: Doctor, general practice of Steps: 2   Home Layout: One level Home Equipment: Pharmacist, hospital (2 wheels);BSC/3in1      Prior Function Prior Level of Function : Independent/Modified Independent  Mobility Comments: used cane around the house and walker kept in the car if needed; one fall in past 6 months       Extremity/Trunk Assessment   Upper Extremity Assessment Upper Extremity Assessment: Generalized weakness         Cervical / Trunk Assessment Cervical / Trunk Assessment: Kyphotic;Lordotic (flexed in standing from prior surgeries per pt)  Communication   Communication Communication: No apparent  difficulties    Cognition Arousal: Alert Behavior During Therapy: WFL for tasks assessed/performed   PT - Cognitive impairments: No apparent impairments                         Following commands: Intact       Cueing Cueing Techniques: Verbal cues     General Comments      Exercises     Assessment/Plan    PT Assessment Patient needs continued PT services  PT Problem List Cardiopulmonary status limiting activity;Decreased mobility;Decreased activity tolerance;Decreased strength;Decreased knowledge of use of DME       PT Treatment Interventions DME instruction;Gait training;Stair training;Patient/family education;Functional mobility training;Therapeutic activities;Therapeutic exercise;Balance training    PT Goals (Current goals can be found in the Care Plan section)  Acute Rehab PT Goals Patient Stated Goal: return to independent PT Goal Formulation: With patient/family Time For Goal Achievement: 01/29/24 Potential to Achieve Goals: Good    Frequency Min 2X/week     Co-evaluation               AM-PAC PT "6 Clicks" Mobility  Outcome Measure Help needed turning from your back to your side while in a flat bed without using bedrails?: A Little Help needed moving from lying on your back to sitting on the side of a flat bed without using bedrails?: A Little Help needed moving to and from a bed to a chair (including a wheelchair)?: A Little Help needed standing up from a chair using your arms (e.g., wheelchair or bedside chair)?: A Little Help needed to walk in hospital room?: A Little Help needed climbing 3-5 steps with a railing? : A Lot 6 Click Score: 17    End of Session Equipment Utilized During Treatment: Oxygen Activity Tolerance: Patient limited by fatigue Patient left: in bed;with call bell/phone within reach;with family/visitor present   PT Visit Diagnosis: Muscle weakness (generalized) (M62.81);Difficulty in walking, not elsewhere classified  (R26.2)    Time: 1740-1811 PT Time Calculation (min) (ACUTE ONLY): 31 min   Charges:   PT Evaluation $PT Eval Moderate Complexity: 1 Mod PT Treatments $Gait Training: 8-22 mins PT General Charges $$ ACUTE PT VISIT: 1 Visit         Abigail Hoff, PT Acute Rehabilitation Services Office:[phone] 01/15/2024   [name] 01/15/2024, 6:25 PM

## 2024-01-15 NOTE — Progress Notes (Signed)
 Patient admitted from [facility] to [address] accompanied by [facility] staff, alert and oriented,oriented in the room and staff. Attached to cardiac monitoring ,CCMD notified. V/S checked and admitting provider notified.

## 2024-01-15 NOTE — Progress Notes (Signed)
 PHARMACY - ANTICOAGULATION CONSULT NOTE  Pharmacy Consult for Heparin  (Eliquis  on hold) Indication: hx pulmonary embolus 12/01/2023  Allergies  Allergen Reactions   Penicillins Anaphylaxis         Patient Measurements: Height: 6\' 2"  (188 cm) Weight: 108.8 kg (239 lb 13.8 oz) IBW/kg (Calculated) : 82.2 HEPARIN  DW (KG): 104.6  Vital Signs: Temp: 97.6 F (36.4 C) (05/23 1602) Temp Source: Oral (05/23 1602) BP: 93/65 (05/23 1602) Pulse Rate: 94 (05/23 1602)  Labs: Recent Labs    01/14/24 1200 01/15/24 0842  HGB 9.8* 9.4*  HCT 30.5* 28.7*  PLT 400 402*  APTT  --  53*  HEPARINUNFRC  --  0.59  CREATININE 0.90 0.69  TROPONINIHS 5  --     Estimated Creatinine Clearance: 116 mL/min (by C-G formula based on SCr of 0.69 mg/dL).  Assessment: 68 y.o. male w/ PMH of HTN, HLD, GERD, metastatic prostate cancer, h/o PE 12/01/23 (started on Eliquis ), presents to ED secondary to shortness of breath, fatigue, dizziness, reports lightheadedness, worsening dyspnea over the last month, denies fever, chills, nausea, vomiting or diarrhea. Last dose of Eliquis  01/14/24 at 0800. Transitioned to heparin  drip in anticipation of procedure for loculated pleural effusion. Will use aPTTs for monitoring while heparin  levels are expected to be falsely elevated due to recent Eliquis  doses.  Initial aPTT was subtherapeutic (53 seconds) this morning.  Drawn about 15 minutes after Heparin  drip held for chest tube placement this afternoon.  Heparin  level 0.59.  Left chest tube placement completed before 2pm and okay to resume heparin  drip now per Loyd Ruiz, PA-C.  No complications.  Confirmed no plans for lytics today.  Goal of Therapy:  Heparin  level 0.3-0.7 units/ml aPTT 66-102 seconds Monitor platelets by anticoagulation protocol: Yes   Plan:  Resume heparin  drip at 1800 units/hr aPTT ~8 hr after drip resumes. Daily aPTT and heparin  level until correlating; daily CBC. Monitor for signs and symptoms of  bleeding. Eliquis  on hold.  [name], [place] 01/15/2024,4:13 PM

## 2024-01-15 NOTE — Assessment & Plan Note (Addendum)
 Holding enza on admission, may resume home supplies of Xtandi  160 mg daily when wife brings to the hospital.

## 2024-01-15 NOTE — Consult Note (Signed)
 [facility] Hematology and oncology consult note   Patient Care Team: [name], MD as PCP - General (Internal Medicine) Luana Rumple, MD as PCP - Cardiology (Cardiology) Millicent Ally, MD as PCP - Sleep Medicine (Cardiology) Katheleen Palmer, RN as Oncology Nurse Navigator   ASSESSMENT & PLAN:  68 y.o.male with past medical history of hypertension, hyperlipidemia, GERD, metastatic CRCP, PE admitted for loculated pleural effusion and sepsis.   Patient is feeling better after IV antibiotics and pleural drainage. Almost feeling back to baseline.  Assessment & Plan Loculated pleural effusion Met sepsis criteria on admission.  Agree with empiric antibiotics  Follow up cultures and treatment per primary team. Prostate cancer metastatic to bone [facility]) Holding enza on admission, may resume home supplies of Xtandi  160 mg daily when wife brings to the hospital.  Pulmonary embolism (HCC) On heparin  When ready to be discharged, may resume apixaban   If discharged over the weekend, will follow up as outpatient. He has appointment with us  on 6/5.  All questions were answered. The patient knows to call the clinic with any problems, questions or concerns.    Lowanda Ruddy, MD 01/15/2024 3:55 PM   CHIEF COMPLAINTS/PURPOSE OF ADMISSION Short of breath  HISTORY OF PRESENTING ILLNESS:  67 y.o.male with past medical history of hypertension, hyperlipidemia, GERD, metastatic CRCP, PE admitted for loculated pleural effusion and sepsis. Report of worsening shortness of breath, fatigue, dizziness, lightheadedness and presented to [facility]. In ED he had lactic acid at 2, leukocytosis with white blood cell count at 11.8, CTA chest obtained, no evidence of PE, but significant for left lung base consolidation with significant loculated pleural effusion. He was started on IV antibiotics and transferred to [facility]. He had pleural catheter inserted today. Lactic acidosis improved. Currently on iv  antibiotics. Overall feeling better since admission after drain placed. No fever or chills. Some coughing.  Summary of oncologic history as follows: Oncology History  Prostate cancer metastatic to bone [facility])  2000 Initial Diagnosis   Initial diagnosis in [place], [place]   11/2005 Surgery   Robotic radical prostatectomy by Dr. Kristeen Peto. pT3a N0.  Positive margin Gleason 3+4 = 7 adenocarcinoma   07/2008 Progression   PSA 0.11. salvage radiation.  Completed in May 2010. (Dr. Lorri Rota)   2014 Progression   Found in the appendiceal mass undergoing laparoscopy by general surgery.  Pathology showed solitary intraperitoneal metastases from prostate cancer   07/2013 - 04/2017 Chemotherapy   Started intermittent ADT   02/05/2018 Tumor Marker   PSA 3.77   05/19/2018 Tumor Marker   PSA 5.26   08/23/2018 Tumor Marker   PSA 6.22   08/2018 Progression   PSA increased to 6.22 eventually. CT showed multiple sclerotic bone lesions in the spine, sacrum, pelvis and bone scan with uptake in the right ischium and multiple ribs.   08/2018 -  Chemotherapy   He started enzalutamide . PSA reached nadir <0.2.   03/15/2020 Imaging   Bone scan 1. Resolution of focal radiotracer activity in the right anterior fourth rib and left sacral ala since prior study. 2. Persistent asymmetric activity involving the distal clavicle right greater than left, nonspecific. While metastatic disease cannot be excluded, degenerative changes could also give this pattern. 3. Degenerative type activity of the bilateral glenohumeral joints, cervical spine, and lumbar spine   04/05/2021 Tumor Marker   PSA 0.11   07/17/2021 Tumor Marker   PSA 0.19 Testosterone  13.3   2023 Progression   Rising PSA   09/20/2021  Tumor Marker   PSA 0.26 Testosterone  20.9   12/16/2021 Tumor Marker   PSA 0.57 Testosterone  25.3   03/26/2022 Tumor Marker   PSA 1.17 Testosterone  14.1   06/2022 Imaging   PSMA PET: Positive abdominal lymph  nodes mildly improved compared to prior conventional imaging 2020.   06/25/2022 Tumor Marker   PSA 1.36 Testosterone  13.9   10/01/2022 Tumor Marker   PSA 3.7 Testosterone  16.1   01/05/2023 Tumor Marker   PSA 4.2 Testosterone  17.6   03/2023 Tumor Marker   PSA 7.18   04/29/2023 Cancer Staging   Staging form: Prostate, AJCC 8th Edition - Clinical: Stage IVB (cT3a, cN0, cM1b, Grade Group: 2) - Signed by Lowanda Ruddy, MD on 04/29/2023 Gleason score: 7 Histologic grading system: 5 grade system    Genetic Testing   Ambry testing. Negative for HRR mutation Tempus Liquid: Negative for HRR mutation   05/12/2023 Imaging   PSMA PET A high left posterior mediastinal node measures 7 mm and a S.U.V. max of 3.6 on 54/4. This measured 5 mm and was not tracer avid previously. A subcarinal node measures 8 mm and a S.U.V. max of 3.4 on 83/3. Compare 7 mm and not tracer avid on the prior. Lymph nodes: Porta hepatis nodes of up to 1.7 cm and a S.U.V. max of 36.6 on 114/4. Compare 9 mm and a S.U.V. max of 21.4 on the prior. Left periaortic node measures 1.3 cm and a S.U.V. max of 35.3 on 134/4 versus 6 mm and a S.U.V. max of 4.5 on the prior. New tracer avid abdominal retroperitoneal node in the preaortic space at 4 mm and a S.U.V. max of 6.7 on 143/4.   Liver: No evidence of liver metastasis.  No focal activity to suggest skeletal metastasis. Lumbar spine fixation. Cervical spine fixation.   06/22/2023 - 07/03/2023 Radiation Therapy   ultrahypofractionated radiotherapy (UHRT) mediastinal and intraabdominal LNs   07/29/2023 -  Chemotherapy   Eligard  22.5 mg  PSA 15.8   09/07/2023 PET scan   PSMA PET 1. Stable intensely radiotracer avid retroperitoneal and periportal lymph nodes consistent with prostate carcinoma nodal metastasis. No interval change. 2. No evidence of local recurrence in the prostatectomy bed. 3. No evidence of visceral metastasis. 4. Single new small focus of radiotracer activity  in the RIGHT acetabulum is concerning oligometastatic skeletal metastasis.   10/22/2023 -  Chemotherapy   Patient is on Treatment Plan : PROSTATE [id number]  [id number]       MEDICAL HISTORY:  Past Medical History:  Diagnosis Date   Anemia    Anxiety    Arthritis    Bladder stone    Cancer (HCC) 2007   prostate CA   COPD (chronic obstructive pulmonary disease) (HCC)    Decreased pulse 08/16/2008   LE doppler - bilateral ABIs normal, bilateral PVRs normal waveform; bilateral common iliac arteries difficult to visualize however velocities suggest less than 50% diameter reduction; bilateral LE normal velocities w/o evidence of diameter reduction   Depression    GERD (gastroesophageal reflux disease)    Hypercholesteremia    Hypertension    Mental disorder    PTSD   Pre-diabetes    Shortness of breath    Sleep apnea    wears CPAP nightly, settings at 14    Tobacco abuse    Urinary incontinence    due to prostate removal    SURGICAL HISTORY: Past Surgical History:  Procedure Laterality Date   ANTERIOR LAT LUMBAR FUSION N/A 11/17/2012  Procedure: XLIF L3 - L4 POSTERIOR L3 -L4 FUSION INSTRUMENTATION/L2 - L3 DECOMPRESSION (REVISION OF POSTERIOR HARDWARE) 1 LEVEL;  Surgeon: Mort Ards, MD;  Location: [facility] OR;  Service: Orthopedics;  Laterality: N/A;  procedure #1: start 0923, end 1057.    BACK SURGERY  2010 / 2014   BLADDER SUSPENSION     x 2   CARDIAC CATHETERIZATION     no PCI   CARDIOVASCULAR STRESS TEST  08/16/2008   EF 57%; inferior region has diaphragmatic attenuation, otherwise normal perfusion w/o evidence of ischemia or infarct; LV normal in size; no scintographic evidence of inducible ischemia; no ECG changes, negative for ischemia; low risk scan   CERVICAL FUSION     COLON RESECTION N/A 07/12/2013   peritoneal biopsy only, evidence of metastatic prostate cancer   COLONOSCOPY     COLONOSCOPY N/A 05/27/2013   RMR: Extrinsic mass effect at the level of the appendiceal  orifice likely represents an appendiceal or periappendiceal process. Neovascular changes of the rectal mucosa consistent with prior history of radiation treatment. Multiple 3-5 mm polyps removed, several tubular adenomas. Diverticulosis.nex tcs 2019   COLONOSCOPY WITH PROPOFOL  N/A 07/05/2018   Procedure: COLONOSCOPY WITH PROPOFOL ;  Surgeon: Suzette Espy, MD;  Location: [facility];  Service: Endoscopy;  Laterality: N/A;  9:30am   COLONOSCOPY WITH PROPOFOL  N/A 03/07/2021   Procedure: COLONOSCOPY WITH PROPOFOL ;  Surgeon: Suzette Espy, MD;  Location: [facility];  Service: Endoscopy;  Laterality: N/A;  2:30pm   CYSTOSCOPY W/ URETERAL STENT PLACEMENT N/A 10/03/2013   Procedure: CYSTOSCOPY AND REMOVAL OF BLADDER NECK STONE ;  Surgeon: Kristeen Peto, MD;  Location: [facility] ORS;  Service: Urology;  Laterality: N/A;  WITH REMOVAL OF BLADDER NECK STONE     ESOPHAGOGASTRODUODENOSCOPY (EGD) WITH ESOPHAGEAL DILATION N/A 05/27/2013   RMR: Mild erosive reflux esophagitis with soft noncritical appearing stricture status post Maloney dilation, antral erosions with reactive gastropathy but no H. pylori   ESOPHAGOGASTRODUODENOSCOPY (EGD) WITH PROPOFOL  N/A 07/05/2018   Procedure: ESOPHAGOGASTRODUODENOSCOPY (EGD) WITH PROPOFOL ;  Surgeon: Suzette Espy, MD;  [facility]: [facility];  Service: Endoscopy;  Laterality: N/A;   ESOPHAGOGASTRODUODENOSCOPY (EGD) WITH PROPOFOL  N/A 02/05/2023   Procedure: ESOPHAGOGASTRODUODENOSCOPY (EGD) WITH PROPOFOL ;  Surgeon: Suzette Espy, MD;  [facility];  Service: Endoscopy;  Laterality: N/A;  11:30 am, asa 3   HOLMIUM LASER APPLICATION N/A 10/03/2013   Procedure: HOLMIUM LASER APPLICATION;  Surgeon: Kristeen Peto, MD;  Location: [facility];  Service: Urology;  Laterality: N/A;   IR FLUORO GUIDE CV LINE RIGHT  10/08/2023   IR REMOVAL TUN CV CATH W/O FL  11/13/2023   IR US  GUIDE VASC ACCESS RIGHT  10/08/2023   MALONEY DILATION N/A 07/05/2018   Procedure: Londa Rival DILATION;  Surgeon:  Suzette Espy, MD;  [facility];  Service: Endoscopy;  Laterality: N/A;   MALONEY DILATION N/A 02/05/2023   Procedure: Londa Rival DILATION;  Surgeon: Suzette Espy, MD;  [facility];  Service: Endoscopy;  Laterality: N/A;   POLYPECTOMY  07/05/2018   Procedure: POLYPECTOMY;  Surgeon: Suzette Espy, MD;  [facility];  Service: Endoscopy;;  cecal polyp , splenic flexure polyps x4   POLYPECTOMY  03/07/2021   Procedure: POLYPECTOMY;  Surgeon: Suzette Espy, MD;  [facility];  Service: Endoscopy;;   PROSTATE SURGERY     TRANSTHORACIC ECHOCARDIOGRAM  08/16/2008   essentially normal for age   URINARY SPHINCTER IMPLANT N/A 01/31/2014   Procedure:  IMPLANTATION ARTIFICIAL SPHINCTER  WITH CYSTOSCOPY;  Surgeon: Devorah Fonder, MD;  Location: [facility];  Service: Urology;  Laterality: N/A;   WRIST SURGERY Right     SOCIAL HISTORY: Social History   Socioeconomic History   Marital status: Married    Spouse name: Not on file   Number of children: Not on file   Years of education: Not on file   Highest education level: Not on file  Occupational History   Occupation: driving truck    Comment: retired  Tobacco Use   Smoking status: Former    Current packs/day: 0.00    Average packs/day: 0.3 packs/day for 43.0 years (10.8 ttl pk-yrs)    Types: Cigarettes    Start date: 02/28/1970    Quit date: 02/28/2013    Years since quitting: 10.8   Smokeless tobacco: Former    Quit date: 06/30/1980  Vaping Use   Vaping status: Never Used  Substance and Sexual Activity   Alcohol use: No   Drug use: No   Sexual activity: Yes    Birth control/protection: None  Other Topics Concern   Not on file  Social History Narrative   Not on file   Social Drivers of Health   Financial Resource Strain: Not on file  Food Insecurity: No Food Insecurity (01/15/2024)   Hunger Vital Sign    Worried About Running Out of Food in the Last Year: Never true    Ran Out of Food in  the Last Year: Never true  Transportation Needs: No Transportation Needs (01/15/2024)   PRAPARE - Administrator, Civil Service (Medical): No    Lack of Transportation (Non-Medical): No  Physical Activity: Not on file  Stress: Not on file  Social Connections: Moderately Isolated (01/15/2024)   Social Connection and Isolation Panel [NHANES]    Frequency of Communication with Friends and Family: Three times a week    Frequency of Social Gatherings with Friends and Family: Three times a week    Attends Religious Services: Never    Active Member of Clubs or Organizations: No    Attends Banker Meetings: Never    Marital Status: Married  Catering manager Violence: Not At Risk (01/15/2024)   Humiliation, Afraid, Rape, and Kick questionnaire    Fear of Current or Ex-Partner: No    Emotionally Abused: No    Physically Abused: No    Sexually Abused: No    FAMILY HISTORY: Family History  Problem Relation Age of Onset   Diabetes Mother    Hypertension Mother    Heart disease Mother    Diabetes Father    Hypertension Father    Colon polyps Father        age 21   Colon polyps Sister    Hypertension Brother    Diabetes Brother    Cancer Paternal Uncle        unknown type   Prostate cancer Cousin 51       maternal first cousin, metastatic   Colon cancer Cousin        paternal first cousin    ALLERGIES:  is allergic to penicillins.  MEDICATIONS:  Current Facility-Administered Medications  Medication Dose Route Frequency Provider Last Rate Last Admin   acetaminophen  (TYLENOL ) tablet 650 mg  650 mg Oral Q6H PRN Elgergawy, Dawood S, MD       Or   acetaminophen  (TYLENOL ) suppository 650 mg  650 mg Rectal Q6H PRN Elgergawy, Dawood S, MD  albuterol  (PROVENTIL ) (2.5 MG/3ML) 0.083% nebulizer solution 2.5 mg  2.5 mg Nebulization Q2H PRN Elgergawy, Dawood S, MD       aztreonam (AZACTAM) 2 g in sodium chloride  0.9 % 100 mL IVPB  2 g Intravenous Q8H [name],  RPH 200 mL/hr at 01/15/24 0836 2 g at 01/15/24 4098   famotidine  (PEPCID ) tablet 20 mg  20 mg Oral Q supper Elgergawy, Dawood S, MD       ferrous sulfate tablet 325 mg  325 mg Oral Daily Elgergawy, Dawood S, MD   325 mg at 01/15/24 0830   furosemide (LASIX) tablet 20 mg  20 mg Oral Daily Elgergawy, Dawood S, MD   20 mg at 01/15/24 0830   heparin  ADULT infusion 100 units/mL (25000 units/250mL)  1,700 Units/hr Intravenous Continuous [name], [facility]   Stopped at 01/15/24 1191   irbesartan (AVAPRO) tablet 150 mg  150 mg Oral Daily Elgergawy, Dawood S, MD   150 mg at 01/15/24 0830   lactated ringers  infusion   Intravenous Continuous Elgergawy, Ardia Kraft, MD 100 mL/hr at 01/15/24 0307 New Bag at 01/15/24 0307   metroNIDAZOLE  (FLAGYL ) IVPB 500 mg  500 mg Intravenous Q12H Elgergawy, Dawood S, MD 100 mL/hr at 01/15/24 0829 500 mg at 01/15/24 4782   pantoprazole  (PROTONIX ) EC tablet 40 mg  40 mg Oral QAC breakfast Elgergawy, Dawood S, MD   40 mg at 01/15/24 0830   rosuvastatin  (CRESTOR ) tablet 40 mg  40 mg Oral Daily Elgergawy, Dawood S, MD   40 mg at 01/15/24 0830   sodium chloride  flush (NS) 0.9 % injection 10 mL  10 mL Intrapleural Q8H Denson Flake, MD       vancomycin  (VANCOCIN ) IVPB 1000 mg/200 mL premix  1,000 mg Intravenous Q12H [name], RPH 200 mL/hr at 01/15/24 1114 1,000 mg at 01/15/24 1114    REVIEW OF SYSTEMS:   Constitutional: Denies fevers, chills Respiratory: positive for cough, shortness of breath  Cardiovascular: chest wall pain Gastrointestinal:  Denies abdominal pain Neurological: positive for fatigue and generalized weaknesses All other systems were reviewed with the patient and are negative.  PHYSICAL EXAMINATION: ECOG PERFORMANCE STATUS: 2 - Symptomatic, <50% confined to bed  Vitals:   01/15/24 1345 01/15/24 1412  BP: 101/63 102/65  Pulse: 98 (!) 104  Resp: (!) 28 (!) 31  Temp:    SpO2: 96% 98%   Filed Weights   01/14/24 1133 01/14/24 2133  Weight: 240 lb  (108.9 kg) 239 lb 13.8 oz (108.8 kg)    GENERAL:alert, no distress and comfortable SKIN: skin color pale. No jaundice LUNGS: on oxygen. Generalized diminished breath sound in all field  normal breathing effort.  Wheezes  HEART: regular rate & rhythm  ABDOMEN: abdomen soft, non-tender Musculoskeletal:  bilateral lower extremity edema NEURO: alert & with fluent speech  LABORATORY DATA:  I have reviewed the data as listed Lab Results  Component Value Date   WBC 11.2 (H) 01/15/2024   HGB 9.4 (L) 01/15/2024   HCT 28.7 (L) 01/15/2024   MCV 95.7 01/15/2024   PLT 402 (H) 01/15/2024   Recent Labs    12/01/23 1917 12/01/23 2104 12/17/23 0903 12/23/23 1515 01/14/24 1200 01/15/24 0842  NA 138   < > 135 133* 129* 130*  K 4.2   < > 4.3 4.6 4.1 4.2  CL 102   < > 99 98 96* 99  CO2 22   < > 29 20 20* 21*  GLUCOSE 102*   < >  107* 97 134* 149*  BUN 17   < > 13 20 14  6*  CREATININE 0.70   < > 0.80 0.73* 0.90 0.69  CALCIUM  9.5   < > 9.9 9.5 8.9 8.6*  GFRNONAA >60   < > >60  --  >60 >60  PROT 7.8  --  7.7  --  7.5  --   ALBUMIN 3.5  --  4.1  --  2.8*  --   AST 24  --  19  --  28  --   ALT 13  --  10  --  20  --   ALKPHOS 60  --  60  --  82  --   BILITOT 0.5  --  0.4  --  0.3  --   BILIDIR  --   --   --   --  0.1  --   IBILI  --   --   --   --  0.2*  --    < > = values in this interval not displayed.    RADIOGRAPHIC STUDIES: I have personally reviewed the radiological images as listed and agreed with the findings in the report. CT Angio Chest PE W/Cm &/Or Wo Cm Result Date: 01/14/2024 CLINICAL DATA:  Pulmonary embolism EXAM: CT ANGIOGRAPHY CHEST WITH CONTRAST TECHNIQUE: Multidetector CT imaging of the chest was performed using the standard protocol during bolus administration of intravenous contrast. Multiplanar CT image reconstructions and MIPs were obtained to evaluate the vascular anatomy. Multiplanar image (3D post-processing) reconstructions and MIPs were obtained to evaluate the  vascular anatomy. RADIATION DOSE REDUCTION: This exam was performed according to the departmental dose-optimization program which includes automated exposure control, adjustment of the mA and/or kV according to patient size and/or use of iterative reconstruction technique. CONTRAST:  75mL OMNIPAQUE  IOHEXOL  350 MG/ML SOLN COMPARISON:  CT of the chest performed December 01, 2023. PET-CT performed Dec 31, 2023 FINDINGS: Cardiovascular: Normal size heart. No pericardial effusion. Three vessel aortic arch. The main pulmonary artery is within normal limits for size. No evidence of pulmonary embolism to the proximal segmental level. Mediastinum/Nodes: Enlarged lymph nodes are present within the mediastinum. A representative lymph node anterior to the thoracic aorta measures 1.4 cm in short axis dimension on image 37 of series 4. Soft tissue thickening is also increased along the left medial pleural surface, left mediastinum and left hilum. Lungs/Pleura: There is a loculated left-sided pleural effusion. This is moderate in size and worst in the left lung base. Consolidative changes are present in the left lung particularly in the medial left upper lobe and left lower lobe. Underlying changes of chronic lung disease are present with cystic lucencies which are upper lobe and peripheral predominant. Peribronchial thickening is also noted. Upper Abdomen: No acute abnormality. Musculoskeletal: No chest wall abnormality. No acute or significant osseous findings. Review of the MIP images confirms the above findings. IMPRESSION: 1. Increase in abnormal soft tissue thickening along the medial pleural surface of the left lung. 2. Moderate loculated left pleural effusion. 3. Increasing mediastinal lymphadenopathy. 4. No evidence of pulmonary embolism to the proximal segmental level. 5. Scattered consolidative changes are also present in the left lung which may represent superimposed infection. Electronically Signed   By: Reagan Camera M.D.    On: 01/14/2024 16:56   DG Chest Port 1 View Result Date: 01/14/2024 CLINICAL DATA:  Shortness of breath, dizziness. EXAM: PORTABLE CHEST 1 VIEW COMPARISON:  December 01, 2023. FINDINGS: Stable cardiomediastinal silhouette. Stable minimal right basilar  scarring or subsegmental atelectasis. Increased left basilar opacity is noted concerning for worsening atelectasis or pneumonia with small left pleural effusion. Bony thorax is unremarkable. IMPRESSION: Increased left basilar opacity is noted concerning for worsening atelectasis or pneumonia with small left pleural effusion. Electronically Signed   By: Rosalene Colon M.D.   On: 01/14/2024 13:56   NM PET (PSMA) SKULL TO MID THIGH Result Date: 12/31/2023 CLINICAL DATA:  Prostate carcinoma with biochemical recurrence. PSA equal 6.8 EXAM: NUCLEAR MEDICINE PET SKULL BASE TO THIGH TECHNIQUE: 8.2 mCi Flotufolastat (Posluma ) was injected intravenously. Full-ring PET imaging was performed from the skull base to thigh after the radiotracer. CT data was obtained and used for attenuation correction and anatomic localization. COMPARISON:  PSMA PET scan 09/07/2023 FINDINGS: NECK No radiotracer activity in neck lymph nodes. Incidental CT finding: None. CHEST New perihilar consolidation in the LEFT upper lobe. New LEFT pleural effusion. Paraseptal emphysema in the medial LEFT upper lobe is also increased. These findings have progressed rapidly from CT January. New enlarged prevascular lymph nodes. For example 15 mm node image 70/4 with SUV max equal 9.6. Consolidation and peribronchial thickening in the medial LEFT upper lobe has radiotracer activity SUV max equal 7.8. Small RIGHT supraclavicular node with SUV max equal 5.0 on image 51. Incidental CT finding: None. ABDOMEN/PELVIS Prostate: No focal activity in the prostate bed. Lymph nodes: No radiotracer avid pelvic adenopathy. Periaortic retroperitoneal lymph nodes on the LEFT are decreased in activity. For example node LEFT of  the aorta on image 135 with SUV max equal 8.9 compared SUV max equal 31.4. Node measures 8 mm compared to 11 mm. Intense radiotracer activity associated with a node dorsal to the pancreatic head with SUV max equal 26.5 (image 113) decreased from SUV max equal 42. This node measures 11 mm short axis compared to 11 mm for no change. No new adenopathy. Liver: No evidence of liver metastasis. Incidental CT finding: None. SKELETON Lesion in the RIGHT acetabulum with SUV max equal 13.8 compared SUV max equal 9.3 on image 192. New skeletal lesion at the skull base on the LEFT with SUV max equal 21.6 on image 14 just anterior to the external auditory canal. IMPRESSION: 1. New perihilar consolidation in the LEFT upper lobe with radiotracer activity. New radiotracer avid prevascular lymph nodes. New pleural effusion. Differential include unusual pattern for metastatic prostate carcinoma versus nonspecific uptake in reactive adenopathy associated pneumonia or drug reaction. Unusual progression to paraseptal emphysema. Recommend pulmonology consultation. 2. 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 Stable lesion in the RIGHT acetabulum 3. Decreased radiotracer activity in periaortic retroperitoneal lymph nodes. 4. No evidence of local prostate carcinoma recurrence in the prostate bed. 5. No evidence of liver metastasis. These results will be called to the ordering clinician or representative by the Radiologist Assistant, and communication documented in the PACS or [employer]. Electronically Signed   By: Deboraha Fallow M.D.   On: 12/31/2023 16:44

## 2024-01-15 NOTE — Procedures (Signed)
 Insertion of Chest Tube Procedure Note  [name]  [id number]  10/02/1955  Date:01/15/24  Time:2:40 PM    Provider Performing: Star East   Procedure: Pleural Catheter Insertion w/ Imaging Guidance (40981)  Indication(s): Effusion  Consent: Risks of the procedure as well as the alternatives and risks of each were explained to the patient and/or caregiver.  Consent for the procedure was obtained and is signed in the bedside chart  Anesthesia: Topical only with 1% lidocaine    Time Out: Verified patient identification, verified procedure, site/side was marked, verified correct patient position, special equipment/implants available, medications/allergies/relevant history reviewed, required imaging and test results available.  Sterile Technique: Maximal sterile technique including full sterile barrier drape, hand hygiene, sterile gown, sterile gloves, mask, hair covering, sterile ultrasound probe cover (if used).  Procedure Description: Ultrasound used to identify appropriate pleural anatomy for placement and overlying skin marked. Area of placement cleaned and draped in sterile fashion.  A 14 French pigtail pleural catheter was placed into the left pleural space using Seldinger technique. Appropriate return of sanguinous fluid was obtained.  The tube was connected to atrium and placed on -20 cm H2O wall suction.  Complications/Tolerance: None; patient tolerated the procedure well. Chest X-ray is ordered to verify placement.  EBL: Minimal  Specimen(s) fluid  Star East, PA-C [name] 01/15/24 2:42 PM  Please see [email or web address] for pager details.  From 7A-7P if no response, please call [phone] After hours, please call ELink [phone]

## 2024-01-15 NOTE — Telephone Encounter (Signed)
 Contacted the patient to reschedule appointments and he told me to contact his wife insead. I left the wife a voicemail with rescheduled appointment details.

## 2024-01-15 NOTE — Progress Notes (Signed)
 PT Cancellation Note  Patient Details [name] MRN: [id number] DOB: 1956/06/26   Cancelled Treatment:    Reason Eval/Treat Not Completed: Patient at procedure or test/unavailable; will follow up when pt available and schedule permits.    Marley Simmers 01/15/2024, 1:32 PM Abigail Hoff, PT Acute Rehabilitation Services Office:[phone] 01/15/2024

## 2024-01-15 NOTE — Assessment & Plan Note (Addendum)
 Met sepsis criteria on admission.  Agree with empiric antibiotics  Follow up cultures and treatment per primary team.

## 2024-01-15 NOTE — Progress Notes (Signed)
 PROGRESS NOTE    [name]  WUJ:[id number] DOB: [birth year] DOA: 01/14/2024 PCP: Michael Parkins, MD   Brief Narrative:  This 68 yrs old male with PMH significant for hypertension, hyperlipidemia, GERD, metastatic prostate cancer followed by Dr. Alita [name], on enzalutamide  and sipuleucel-T , recent hospitalization where he was diagnosed with PE, started on Eliquis , presents to ED secondary to shortness of breath, fatigue, dizziness, lightheadedness, worsening dyspnea over the last month. In ED lactic acid at 2.0, white blood cell count at 11.8, CTA chest obtained, no evidence of PE, but significant for left lung base consolidation with significant loculated pleural effusion,.  Patient was admitted for further evaluation.  Pulmonology is consulted.   Assessment & Plan:   Principal Problem:   Loculated pleural effusion Active Problems:   Essential hypertension   Obesity (BMI 30-39.9)   COPD (chronic obstructive pulmonary disease) with chronic bronchitis (HCC)   GERD (gastroesophageal reflux disease)   Type 2 diabetes mellitus with obesity (HCC)   Prostate cancer metastatic to bone (HCC)   Pulmonary embolism (HCC)   Loculated left pleural effusion: Sepsis due to HCAP: Patient presented with septic criteria (elevated lactic acid, leukocytosis, tachypnea and tachycardia) There is high concern for aspiration, his wife reports significant coughing / choking when eating. Continue on dysphagia diet, Speech and swallow evaluation. Initiated on empiric antibiotics ( Vancomycin , Azactam and Flagyl  ) Encourage use of  incentive spirometry and flutter valve. Pulmonology consulted , Patient may likely need chest tube with thrombolytics.  Pulmonary embolism: Diagnosed with PE and started on Eliquis  upon recent hospitalization.   Has been compliant with this medication. Patient placed on heparin  as he might require chest tube insertion.   Prolonged QT interval: Hold  QT prolonging agents for  now. Hold Abilify , Plaquenil, Effexor and desipramine   Essential hypertension: Resume home meds   Mixed hyperlipidemia: Continue Crestor , fenofibrate    GERD: Continue Protonix    Metastatic prostate cancer: Patient follows with Dr. Alita [name], [name] For now we will hold his Xtandi  due to acute infection   Rheumatoid  arthritis: -hold Plaquenil due to prolonged QTc   DVT prophylaxis:Heparin  sq Code Status: Full code Family Communication:No family at bed side. Disposition Plan:   Status is: Inpatient Remains inpatient appropriate because: Severity of illness.   Consultants:  Pulmonology  Procedures: None  Antimicrobials:  Anti-infectives (From admission, onward)    Start     Dose/Rate Route Frequency Ordered Stop   01/15/24 1000  vancomycin  (VANCOCIN ) IVPB 1000 mg/200 mL premix        1,000 mg 200 mL/hr over 60 Minutes Intravenous Every 12 hours 01/14/24 2030     01/15/24 0000  aztreonam (AZACTAM) 2 g in sodium chloride  0.9 % 100 mL IVPB        2 g 200 mL/hr over 30 Minutes Intravenous Every 8 hours 01/14/24 2030     01/14/24 2100  metroNIDAZOLE  (FLAGYL ) IVPB 500 mg        500 mg 100 mL/hr over 60 Minutes Intravenous Every 12 hours 01/14/24 2001     01/14/24 2100  vancomycin  (VANCOCIN ) IVPB 1000 mg/200 mL premix        1,000 mg 200 mL/hr over 60 Minutes Intravenous  Once 01/14/24 2030 01/15/24 0028   01/14/24 1730  vancomycin  (VANCOCIN ) IVPB 1000 mg/200 mL premix        1,000 mg 200 mL/hr over 60 Minutes Intravenous  Once 01/14/24 1715 01/14/24 1937   01/14/24 1600  doxycycline  (VIBRAMYCIN ) 100 mg in sodium chloride  0.9 %  250 mL IVPB        100 mg 125 mL/hr over 120 Minutes Intravenous  Once 01/14/24 1518 01/14/24 1939   01/14/24 1530  aztreonam (AZACTAM) 2 g in sodium chloride  0.9 % 100 mL IVPB        2 g 200 mL/hr over 30 Minutes Intravenous  Once 01/14/24 1519 01/14/24 1654      Subjective: Patient was seen and examined at bedside.  Overnight events  noted. Patient reports feeling same,  denies any chest pain.  Sitting comfortably.  Objective: Vitals:   01/15/24 0004 01/15/24 0324 01/15/24 0500 01/15/24 0812  BP: 113/60 125/74  129/68  Pulse: 100 98  97  Resp: 20 20 (!) 22 (!) 25  Temp: 98.3 F (36.8 C) 98.3 F (36.8 C)  99.1 F (37.3 C)  TempSrc: Oral Oral  Oral  SpO2: 100% 100%  100%  Weight:      Height:        Intake/Output Summary (Last 24 hours) at 01/15/2024 1131 Last data filed at 01/15/2024 0831 Gross per 24 hour  Intake 200 ml  Output --  Net 200 ml   Filed Weights   01/14/24 1133 01/14/24 2133  Weight: 108.9 kg 108.8 kg    Examination:  General exam: Appears calm and comfortable, not in any acute distress. Respiratory system: Decreased breath sounds. Respiratory effort normal.  RR 15 Cardiovascular system: S1 & S2 heard, RRR. No JVD, murmurs, rubs, gallops or clicks. Gastrointestinal system: Abdomen is non distended, soft and non tender.  Normal bowel sounds heard. Central nervous system: Alert and oriented x 3. No focal neurological deficits. Extremities: No edema, no cyanosis, no clubbing. Skin: No rashes, lesions or ulcers Psychiatry: Judgement and insight appear normal. Mood & affect appropriate.     Data Reviewed: I have personally reviewed following labs and imaging studies  CBC: Recent Labs  Lab 01/14/24 1200 01/15/24 0842  WBC 11.8* 11.2*  HGB 9.8* 9.4*  HCT 30.5* 28.7*  MCV 98.4 95.7  PLT 400 402*   Basic Metabolic Panel: Recent Labs  Lab 01/14/24 1200 01/14/24 1409 01/15/24 0842  NA 129*  --  130*  K 4.1  --  4.2  CL 96*  --  99  CO2 20*  --  21*  GLUCOSE 134*  --  149*  BUN 14  --  6*  CREATININE 0.90  --  0.69  CALCIUM  8.9  --  8.6*  MG  --  2.1  --    GFR: Estimated Creatinine Clearance: 116 mL/min (by C-G formula based on SCr of 0.69 mg/dL). Liver Function Tests: Recent Labs  Lab 01/14/24 1200  AST 28  ALT 20  ALKPHOS 82  BILITOT 0.3  PROT 7.5  ALBUMIN 2.8*    No results for input(s): "LIPASE", "AMYLASE" in the last 168 hours. No results for input(s): "AMMONIA" in the last 168 hours. Coagulation Profile: No results for input(s): "INR", "PROTIME" in the last 168 hours. Cardiac Enzymes: No results for input(s): "CKTOTAL", "CKMB", "CKMBINDEX", "TROPONINI" in the last 168 hours. BNP (last 3 results) No results for input(s): "PROBNP" in the last 8760 hours. HbA1C: No results for input(s): "HGBA1C" in the last 72 hours. CBG: No results for input(s): "GLUCAP" in the last 168 hours. Lipid Profile: No results for input(s): "CHOL", "HDL", "LDLCALC", "TRIG", "CHOLHDL", "LDLDIRECT" in the last 72 hours. Thyroid  Function Tests: No results for input(s): "TSH", "T4TOTAL", "FREET4", "T3FREE", "THYROIDAB" in the last 72 hours. Anemia Panel: No results for input(s): "  VITAMINB12", "FOLATE", "FERRITIN", "TIBC", "IRON", "RETICCTPCT" in the last 72 hours. Sepsis Labs: Recent Labs  Lab 01/14/24 1209 01/14/24 1423  LATICACIDVEN 2.0* 1.2    Recent Results (from the past 240 hours)  Resp panel by RT-PCR (RSV, Flu A&B, Covid) Anterior Nasal Swab     Status: None   Collection Time: 01/14/24 12:09 PM   Specimen: Anterior Nasal Swab  Result Value Ref Range Status   SARS Coronavirus 2 by RT PCR NEGATIVE NEGATIVE Final    Comment: (NOTE) SARS-CoV-2 target nucleic acids are NOT DETECTED.  The SARS-CoV-2 RNA is generally detectable in upper respiratory specimens during the acute phase of infection. The lowest concentration of SARS-CoV-2 viral copies this assay can detect is 138 copies/mL. A negative result does not preclude SARS-Cov-2 infection and should not be used as the sole basis for treatment or other patient management decisions. A negative result may occur with  improper specimen collection/handling, submission of specimen other than nasopharyngeal swab, presence of viral mutation(s) within the areas targeted by this assay, and inadequate number of  viral copies(<138 copies/mL). A negative result must be combined with clinical observations, patient history, and epidemiological information. The expected result is Negative.  Fact Sheet for Patients:  BloggerCourse.com  Fact Sheet for Healthcare Providers:  SeriousBroker.it  This test is no t yet approved or cleared by the [place]  FDA and  has been authorized for detection and/or diagnosis of SARS-CoV-2 by FDA under an Emergency Use Authorization (EUA). This EUA will remain  in effect (meaning this test can be used) for the duration of the COVID-19 declaration under Section 564(b)(1) of the Act, 21 U.S.C.section 360bbb-3(b)(1), unless the authorization is terminated  or revoked sooner.       Influenza A by PCR NEGATIVE NEGATIVE Final   Influenza B by PCR NEGATIVE NEGATIVE Final    Comment: (NOTE) The Xpert Xpress SARS-CoV-2/FLU/RSV plus assay is intended as an aid in the diagnosis of influenza from Nasopharyngeal swab specimens and should not be used as a sole basis for treatment. Nasal washings and aspirates are unacceptable for Xpert Xpress SARS-CoV-2/FLU/RSV testing.  Fact Sheet for Patients: BloggerCourse.com  Fact Sheet for Healthcare Providers: SeriousBroker.it  This test is not yet approved or cleared by the [place]  FDA and has been authorized for detection and/or diagnosis of SARS-CoV-2 by FDA under an Emergency Use Authorization (EUA). This EUA will remain in effect (meaning this test can be used) for the duration of the COVID-19 declaration under Section 564(b)(1) of the Act, 21 U.S.C. section 360bbb-3(b)(1), unless the authorization is terminated or revoked.     Resp Syncytial Virus by PCR NEGATIVE NEGATIVE Final    Comment: (NOTE) Fact Sheet for Patients: BloggerCourse.com  Fact Sheet for Healthcare  Providers: SeriousBroker.it  This test is not yet approved or cleared by the [place]  FDA and has been authorized for detection and/or diagnosis of SARS-CoV-2 by FDA under an Emergency Use Authorization (EUA). This EUA will remain in effect (meaning this test can be used) for the duration of the COVID-19 declaration under Section 564(b)(1) of the Act, 21 U.S.C. section 360bbb-3(b)(1), unless the authorization is terminated or revoked.  Performed at [facility], [address], [place], [place] [address]   Culture, blood (routine x 2)     Status: None (Preliminary result)   Collection Time: 01/14/24  5:00 PM   Specimen: BLOOD  Result Value Ref Range Status   Specimen Description BLOOD BLOOD RIGHT HAND  Final   Special Requests  Final    BOTTLES DRAWN AEROBIC AND ANAEROBIC Blood Culture adequate volume   Culture   Final    NO GROWTH < 12 HOURS Performed at [facility], [address] [place], [place] [address]    Report Status PENDING  Incomplete  Culture, blood (routine x 2)     Status: None (Preliminary result)   Collection Time: 01/14/24  5:47 PM   Specimen: BLOOD  Result Value Ref Range Status   Specimen Description BLOOD RIGHT ANTECUBITAL  Final   Special Requests   Final    BOTTLES DRAWN AEROBIC ONLY Blood Culture adequate volume   Culture   Final    NO GROWTH < 12 HOURS Performed at [facility], [address] [place], [place] [address]    Report Status PENDING  Incomplete  MRSA Next Gen by PCR, Nasal     Status: None   Collection Time: 01/15/24 12:12 AM   Specimen: Nasal Mucosa; Nasal Swab  Result Value Ref Range Status   MRSA by PCR Next Gen NOT DETECTED NOT DETECTED Final    Comment: (NOTE) The GeneXpert MRSA Assay (FDA approved for NASAL specimens only), is one component of a comprehensive MRSA colonization surveillance program. It is not intended to diagnose MRSA infection nor to guide or monitor treatment for MRSA  infections. Test performance is not FDA approved in patients less than 26 years old. Performed at [facility], [address] [place], [place] [address]     Radiology Studies: CT Angio Chest PE W/Cm &/Or Wo Cm Result Date: 01/14/2024 CLINICAL DATA:  Pulmonary embolism EXAM: CT ANGIOGRAPHY CHEST WITH CONTRAST TECHNIQUE: Multidetector CT imaging of the chest was performed using the standard protocol during bolus administration of intravenous contrast. Multiplanar CT image reconstructions and MIPs were obtained to evaluate the vascular anatomy. Multiplanar image (3D post-processing) reconstructions and MIPs were obtained to evaluate the vascular anatomy. RADIATION DOSE REDUCTION: This exam was performed according to the departmental dose-optimization program which includes automated exposure control, adjustment of the mA and/or kV according to patient size and/or use of iterative reconstruction technique. CONTRAST:  75mL OMNIPAQUE  IOHEXOL  350 MG/ML SOLN COMPARISON:  CT of the chest performed December 01, 2023. PET-CT performed Dec 31, 2023 FINDINGS: Cardiovascular: Normal size heart. No pericardial effusion. Three vessel aortic arch. The main pulmonary artery is within normal limits for size. No evidence of pulmonary embolism to the proximal segmental level. Mediastinum/Nodes: Enlarged lymph nodes are present within the mediastinum. A representative lymph node anterior to the thoracic aorta measures 1.4 cm in short axis dimension on image 37 of series 4. Soft tissue thickening is also increased along the left medial pleural surface, left mediastinum and left hilum. Lungs/Pleura: There is a loculated left-sided pleural effusion. This is moderate in size and worst in the left lung base. Consolidative changes are present in the left lung particularly in the medial left upper lobe and left lower lobe. Underlying changes of chronic lung disease are present with cystic lucencies which are upper lobe and peripheral  predominant. Peribronchial thickening is also noted. Upper Abdomen: No acute abnormality. Musculoskeletal: No chest wall abnormality. No acute or significant osseous findings. Review of the MIP images confirms the above findings. IMPRESSION: 1. Increase in abnormal soft tissue thickening along the medial pleural surface of the left lung. 2. Moderate loculated left pleural effusion. 3. Increasing mediastinal lymphadenopathy. 4. No evidence of pulmonary embolism to the proximal segmental level. 5. Scattered consolidative changes are also present in the left lung  which may represent superimposed infection. Electronically Signed   By: Reagan Camera M.D.   On: 01/14/2024 16:56   DG Chest Port 1 View Result Date: 01/14/2024 CLINICAL DATA:  Shortness of breath, dizziness. EXAM: PORTABLE CHEST 1 VIEW COMPARISON:  December 01, 2023. FINDINGS: Stable cardiomediastinal silhouette. Stable minimal right basilar scarring or subsegmental atelectasis. Increased left basilar opacity is noted concerning for worsening atelectasis or pneumonia with small left pleural effusion. Bony thorax is unremarkable. IMPRESSION: Increased left basilar opacity is noted concerning for worsening atelectasis or pneumonia with small left pleural effusion. Electronically Signed   By: Rosalene Colon M.D.   On: 01/14/2024 13:56   Scheduled Meds:  famotidine   20 mg Oral Q supper   ferrous sulfate  325 mg Oral Daily   furosemide  20 mg Oral Daily   irbesartan  150 mg Oral Daily   pantoprazole   40 mg Oral QAC breakfast   rosuvastatin   40 mg Oral Daily   Continuous Infusions:  aztreonam 2 g (01/15/24 0836)   heparin  Stopped (01/15/24 0826)   lactated ringers  100 mL/hr at 01/15/24 0307   metronidazole  500 mg (01/15/24 0829)   vancomycin  1,000 mg (01/15/24 1114)     LOS: 1 day    Time spent: 50 mins    Magdalene School, MD [facility]   If 7PM-7AM, please contact night-coverage

## 2024-01-15 NOTE — Assessment & Plan Note (Addendum)
 On heparin  When ready to be discharged, may resume apixaban 

## 2024-01-15 NOTE — Evaluation (Signed)
 Clinical/Bedside Swallow Evaluation Patient Details  [name] MRN: [id number] Date of Birth: Oct 03, 1955  Today's Date: 01/15/2024 Time: SLP Start Time (ACUTE ONLY): 4098 SLP Stop Time (ACUTE ONLY): 0951 SLP Time Calculation (min) (ACUTE ONLY): 8 min  Past Medical History:  Past Medical History:  Diagnosis Date   Anemia    Anxiety    Arthritis    Bladder stone    Cancer (HCC) 2007   prostate CA   COPD (chronic obstructive pulmonary disease) (HCC)    Decreased pulse 08/16/2008   LE doppler - bilateral ABIs normal, bilateral PVRs normal waveform; bilateral common iliac arteries difficult to visualize however velocities suggest less than 50% diameter reduction; bilateral LE normal velocities w/o evidence of diameter reduction   Depression    GERD (gastroesophageal reflux disease)    Hypercholesteremia    Hypertension    Mental disorder    PTSD   Pre-diabetes    Shortness of breath    Sleep apnea    wears CPAP nightly, settings at 14    Tobacco abuse    Urinary incontinence    due to prostate removal   Past Surgical History:  Past Surgical History:  Procedure Laterality Date   ANTERIOR LAT LUMBAR FUSION N/A 11/17/2012   Procedure: XLIF L3 - L4 POSTERIOR L3 -L4 FUSION INSTRUMENTATION/L2 - L3 DECOMPRESSION (REVISION OF POSTERIOR HARDWARE) 1 LEVEL;  Surgeon: Mort Ards, MD;  Location: [facility];  Service: Orthopedics;  Laterality: N/A;  procedure #1: start 0923, end 1057.    BACK SURGERY  2010 / 2014   BLADDER SUSPENSION     x 2   CARDIAC CATHETERIZATION     no PCI   CARDIOVASCULAR STRESS TEST  08/16/2008   EF 57%; inferior region has diaphragmatic attenuation, otherwise normal perfusion w/o evidence of ischemia or infarct; LV normal in size; no scintographic evidence of inducible ischemia; no ECG changes, negative for ischemia; low risk scan   CERVICAL FUSION     COLON RESECTION N/A 07/12/2013   peritoneal biopsy only, evidence of metastatic prostate cancer    COLONOSCOPY     COLONOSCOPY N/A 05/27/2013   RMR: Extrinsic mass effect at the level of the appendiceal orifice likely represents an appendiceal or periappendiceal process. Neovascular changes of the rectal mucosa consistent with prior history of radiation treatment. Multiple 3-5 mm polyps removed, several tubular adenomas. Diverticulosis.nex tcs 2019   COLONOSCOPY WITH PROPOFOL  N/A 07/05/2018   Procedure: COLONOSCOPY WITH PROPOFOL ;  Surgeon: Suzette Espy, MD;  Location: [facility];  Service: Endoscopy;  Laterality: N/A;  9:30am   COLONOSCOPY WITH PROPOFOL  N/A 03/07/2021   Procedure: COLONOSCOPY WITH PROPOFOL ;  Surgeon: Suzette Espy, MD;  Location: [facility];  Service: Endoscopy;  Laterality: N/A;  2:30pm   CYSTOSCOPY W/ URETERAL STENT PLACEMENT N/A 10/03/2013   Procedure: CYSTOSCOPY AND REMOVAL OF BLADDER NECK STONE ;  Surgeon: Kristeen Peto, MD;  Location: [facility];  Service: Urology;  Laterality: N/A;  WITH REMOVAL OF BLADDER NECK STONE     ESOPHAGOGASTRODUODENOSCOPY (EGD) WITH ESOPHAGEAL DILATION N/A 05/27/2013   RMR: Mild erosive reflux esophagitis with soft noncritical appearing stricture status post Maloney dilation, antral erosions with reactive gastropathy but no H. pylori   ESOPHAGOGASTRODUODENOSCOPY (EGD) WITH PROPOFOL  N/A 07/05/2018   Procedure: ESOPHAGOGASTRODUODENOSCOPY (EGD) WITH PROPOFOL ;  Surgeon: Suzette Espy, MD;  [facility];  Service: Endoscopy;  Laterality: N/A;   ESOPHAGOGASTRODUODENOSCOPY (EGD) WITH PROPOFOL  N/A 02/05/2023   Procedure: ESOPHAGOGASTRODUODENOSCOPY (EGD) WITH PROPOFOL ;  Surgeon:  Rourk, Windsor Hatcher, MD;  Location: [facility];  Service: Endoscopy;  Laterality: N/A;  11:30 am, asa 3   HOLMIUM LASER APPLICATION N/A 10/03/2013   Procedure: HOLMIUM LASER APPLICATION;  Surgeon: Kristeen Peto, MD;  Location: [facility];  Service: Urology;  Laterality: N/A;   IR FLUORO GUIDE CV LINE RIGHT  10/08/2023   IR REMOVAL TUN CV CATH W/O FL  11/13/2023   IR US  GUIDE VASC  ACCESS RIGHT  10/08/2023   MALONEY DILATION N/A 07/05/2018   Procedure: Londa Rival DILATION;  Surgeon: Suzette Espy, MD;  [facility];  Service: Endoscopy;  Laterality: N/A;   MALONEY DILATION N/A 02/05/2023   Procedure: Londa Rival DILATION;  Surgeon: Suzette Espy, MD;  [facility]: [facility];  Service: Endoscopy;  Laterality: N/A;   POLYPECTOMY  07/05/2018   Procedure: POLYPECTOMY;  Surgeon: Suzette Espy, MD;  [facility];  Service: Endoscopy;;  cecal polyp , splenic flexure polyps x4   POLYPECTOMY  03/07/2021   Procedure: POLYPECTOMY;  Surgeon: Suzette Espy, MD;  [facility]: [facility];  Service: Endoscopy;;   PROSTATE SURGERY     TRANSTHORACIC ECHOCARDIOGRAM  08/16/2008   essentially normal for age   URINARY SPHINCTER IMPLANT N/A 01/31/2014   Procedure: IMPLANTATION ARTIFICIAL SPHINCTER  WITH CYSTOSCOPY;  Surgeon: Devorah Fonder, MD;  Location: [facility];  Service: Urology;  Laterality: N/A;   WRIST SURGERY Right    HPI:  [name] is a 68 yo male presenting [facility] ED 5/22 with shortness of breath, dizziness, and fatigue x1 month. Recent admission with PE. CTA Chest shows loculated L pleural effusion with abnormal soft tissue thickening along the medial pleural surface of the L lung and scattered consolidative changes which may represent superimposed infection. Seen by GI 05/20/23 and reported a globus sensation with solids and subsequent "choking". EGD 07/05/18 shows a mild Schatzki's ring at GE junction s/p dilation and a small hiatal hernia. PMH includes HTN, HLD, GERD, metastatic prostate cancer    Assessment / Plan / Recommendation  Clinical Impression  Pt denies difficulty swallowing or any significant coughing with PO intake. Oral motor function appears WFL, although pt's dentures are not currently available. He states he can masticate functionally without them in place. He took sequential sips of water  without signs clinically concerning for aspiration.  Observed him feed himself trials of purees and solids with timely oral transit and clearance. Pt denies frequent PNA or shortness of breath. Discussed completing an MBS but pt states he does not wish to proceed at this time. Given pt's history of esophageal dysphagia, suspect risk of aspiration is greatest post-prandially. Recommend resuming regular diet with thin liquids. Provided education regarding aspiration and esophageal precautions. No further SLP f/u is needed at this time, will sign off. SLP Visit Diagnosis: Dysphagia, unspecified (R13.10)    Aspiration Risk  Mild aspiration risk    Diet Recommendation Regular;Thin liquid    Liquid Administration via: Cup;Straw Medication Administration: Whole meds with liquid Supervision: Patient able to self feed Compensations: Minimize environmental distractions;Slow rate;Small sips/bites Postural Changes: Seated upright at 90 degrees;Remain upright for at least 30 minutes after po intake    Other  Recommendations Oral Care Recommendations: Oral care BID    Recommendations for follow up therapy are one component of a multi-disciplinary discharge planning process, led by the attending physician.  Recommendations may be updated based on patient status, additional functional criteria and insurance authorization.  Follow up Recommendations No SLP follow  up      Assistance Recommended at Discharge    Functional Status Assessment Patient has not had a recent decline in their functional status  Frequency and Duration            Prognosis Prognosis for improved oropharyngeal function: Good      Swallow Study   General HPI: IAIN SAWCHUK is a 68 yo male presenting to [facility] ED 5/22 with shortness of breath, dizziness, and fatigue x1 month. Recent admission with PE. CTA Chest shows loculated L pleural effusion with abnormal soft tissue thickening along the medial pleural surface of the L lung and scattered consolidative changes which may represent  superimposed infection. Seen by GI 05/20/23 and reported a globus sensation with solids and subsequent "choking". EGD 07/05/18 shows a mild Schatzki's ring at GE junction s/p dilation and a small hiatal hernia. PMH includes HTN, HLD, GERD, metastatic prostate cancer Type of Study: Bedside Swallow Evaluation Previous Swallow Assessment: none in chart Diet Prior to this Study: Dysphagia 1 (pureed);Moderately thick liquids (Level 3, honey thick) Temperature Spikes Noted: No Respiratory Status: Nasal cannula History of Recent Intubation: No Behavior/Cognition: Alert;Cooperative Oral Cavity Assessment: Within Functional Limits Oral Care Completed by SLP: No Oral Cavity - Dentition: Edentulous;Dentures, not available Vision: Functional for self-feeding Self-Feeding Abilities: Able to feed self Patient Positioning: Upright in bed Baseline Vocal Quality: Normal Volitional Cough: Strong Volitional Swallow: Able to elicit    Oral/Motor/Sensory Function Overall Oral Motor/Sensory Function: Within functional limits   Ice Chips Ice chips: Not tested   Thin Liquid Thin Liquid: Within functional limits Presentation: Straw;Self Fed    Nectar Thick Nectar Thick Liquid: Not tested   Honey Thick Honey Thick Liquid: Not tested   Puree Puree: Within functional limits Presentation: Spoon;Self Fed   Solid     Solid: Within functional limits Presentation: Self Fed      Amil Kale, M.A., CCC-SLP Speech Language Pathology, Acute Rehabilitation Services  Secure Chat preferred [phone]  01/15/2024,10:33 AM

## 2024-01-16 ENCOUNTER — Inpatient Hospital Stay (HOSPITAL_COMMUNITY)

## 2024-01-16 DIAGNOSIS — C61 Malignant neoplasm of prostate: Secondary | ICD-10-CM | POA: Diagnosis not present

## 2024-01-16 DIAGNOSIS — J90 Pleural effusion, not elsewhere classified: Secondary | ICD-10-CM | POA: Diagnosis not present

## 2024-01-16 LAB — BASIC METABOLIC PANEL WITH GFR
Anion gap: 9 (ref 5–15)
Anion gap: 15 (ref 5–15)
BUN: 10 mg/dL (ref 8–23)
BUN: 13 mg/dL (ref 8–23)
CO2: 21 mmol/L — ABNORMAL LOW (ref 22–32)
CO2: 19 mmol/L — ABNORMAL LOW (ref 22–32)
Calcium: 8.7 mg/dL — ABNORMAL LOW (ref 8.9–10.3)
Calcium: 8.4 mg/dL — ABNORMAL LOW (ref 8.9–10.3)
Chloride: 100 mmol/L (ref 98–111)
Chloride: 98 mmol/L (ref 98–111)
Creatinine, Ser: 0.73 mg/dL (ref 0.61–1.24)
Creatinine, Ser: 1.07 mg/dL (ref 0.61–1.24)
GFR, Estimated: >60 mL/min (ref >60)
GFR, Estimated: >60 mL/min (ref >60)
Glucose, Bld: 174 mg/dL — ABNORMAL HIGH (ref 70–99)
Glucose, Bld: 199 mg/dL — ABNORMAL HIGH (ref 70–99)
Potassium: 4.2 mmol/L (ref 3.5–5.1)
Potassium: 4.3 mmol/L (ref 3.5–5.1)
Sodium: 130 mmol/L — ABNORMAL LOW (ref 135–145)
Sodium: 132 mmol/L — ABNORMAL LOW (ref 135–145)

## 2024-01-16 LAB — CBC
HCT: 29.9 % — ABNORMAL LOW (ref 39.0–52.0)
HCT: 29.7 % — ABNORMAL LOW (ref 39.0–52.0)
Hemoglobin: 9.4 g/dL — ABNORMAL LOW (ref 13.0–17.0)
Hemoglobin: 9.3 g/dL — ABNORMAL LOW (ref 13.0–17.0)
MCH: 30.8 pg (ref 26.0–34.0)
MCH: 29.9 pg (ref 26.0–34.0)
MCHC: 31.4 g/dL (ref 30.0–36.0)
MCHC: 31.3 g/dL (ref 30.0–36.0)
MCV: 98 fL (ref 80.0–100.0)
MCV: 95.5 fL (ref 80.0–100.0)
Platelets: 407 10*3/uL — ABNORMAL HIGH (ref 150–400)
Platelets: 474 10*3/uL — ABNORMAL HIGH (ref 150–400)
RBC: 3.05 MIL/uL — ABNORMAL LOW (ref 4.22–5.81)
RBC: 3.11 MIL/uL — ABNORMAL LOW (ref 4.22–5.81)
RDW: 14.1 % (ref 11.5–15.5)
RDW: 14.2 % (ref 11.5–15.5)
WBC: 14.4 10*3/uL — ABNORMAL HIGH (ref 4.0–10.5)
WBC: 18.8 10*3/uL — ABNORMAL HIGH (ref 4.0–10.5)
nRBC: 0 % (ref 0.0–0.2)
nRBC: 0 % (ref 0.0–0.2)

## 2024-01-16 LAB — BLOOD GAS, ARTERIAL
Acid-base deficit: 3.8 mmol/L — ABNORMAL HIGH (ref 0.0–2.0)
Bicarbonate: 19 mmol/L — ABNORMAL LOW (ref 20.0–28.0)
O2 Saturation: 96.8 %
Patient temperature: 37
pCO2 arterial: 28 mmHg — ABNORMAL LOW (ref 32–48)
pH, Arterial: 7.44 (ref 7.35–7.45)
pO2, Arterial: 89 mmHg (ref 83–108)

## 2024-01-16 LAB — TROPONIN I (HIGH SENSITIVITY)
Troponin I (High Sensitivity): 26 ng/L — ABNORMAL HIGH (ref <18)
Troponin I (High Sensitivity): 26 ng/L — ABNORMAL HIGH (ref <18)

## 2024-01-16 LAB — APTT
aPTT: 56 s — ABNORMAL HIGH (ref 24–36)
aPTT: 60 s — ABNORMAL HIGH (ref 24–36)
aPTT: 43 s — ABNORMAL HIGH (ref 24–36)

## 2024-01-16 LAB — GLUCOSE, CAPILLARY
Glucose-Capillary: 182 mg/dL — ABNORMAL HIGH (ref 70–99)
Glucose-Capillary: 200 mg/dL — ABNORMAL HIGH (ref 70–99)

## 2024-01-16 LAB — PROCALCITONIN: Procalcitonin: 0.8 ng/mL

## 2024-01-16 LAB — PHOSPHORUS: Phosphorus: 3 mg/dL (ref 2.5–4.6)

## 2024-01-16 LAB — LACTIC ACID, PLASMA: Lactic Acid, Venous: 2.9 mmol/L (ref 0.5–1.9)

## 2024-01-16 LAB — HEPARIN LEVEL (UNFRACTIONATED): Heparin Unfractionated: 0.39 [IU]/mL (ref 0.30–0.70)

## 2024-01-16 LAB — MAGNESIUM: Magnesium: 2 mg/dL (ref 1.7–2.4)

## 2024-01-16 MED ORDER — SODIUM CHLORIDE (PF) 0.9 % IJ SOLN
10.0000 mg | Freq: Once | INTRAMUSCULAR | Status: AC
  Administered 2024-01-16: 10 mg via INTRAPLEURAL
  Filled 2024-01-16: qty 10

## 2024-01-16 MED ORDER — MORPHINE SULFATE (PF) 2 MG/ML IV SOLN
1.0000 mg | Freq: Once | INTRAVENOUS | Status: AC
  Administered 2024-01-16: 1 mg via INTRAVENOUS
  Filled 2024-01-16: qty 1

## 2024-01-16 MED ORDER — RACEPINEPHRINE HCL 2.25 % IN NEBU
0.5000 mL | INHALATION_SOLUTION | Freq: Once | RESPIRATORY_TRACT | Status: AC
  Administered 2024-01-16: 0.5 mL via RESPIRATORY_TRACT
  Filled 2024-01-16: qty 0.5

## 2024-01-16 MED ORDER — ORAL CARE MOUTH RINSE: 15.0000 mL | OROMUCOSAL | Status: DC | PRN

## 2024-01-16 MED ORDER — SODIUM CHLORIDE 0.9 % IV BOLUS
500.0000 mL | Freq: Once | INTRAVENOUS | Status: AC
  Administered 2024-01-16: 500 mL via INTRAVENOUS

## 2024-01-16 MED ORDER — METHYLPREDNISOLONE SODIUM SUCC 125 MG IJ SOLR
INTRAMUSCULAR | Status: AC
  Filled 2024-01-16: qty 2

## 2024-01-16 MED ORDER — IPRATROPIUM-ALBUTEROL 0.5-2.5 (3) MG/3ML IN SOLN
3.0000 mL | Freq: Four times a day (QID) | RESPIRATORY_TRACT | Status: DC
  Administered 2024-01-16 – 2024-01-17 (×3): 3 mL via RESPIRATORY_TRACT
  Filled 2024-01-16 (×3): qty 3

## 2024-01-16 MED ORDER — ENSURE ENLIVE PO LIQD
237.0000 mL | Freq: Two times a day (BID) | ORAL | Status: DC
  Administered 2024-01-17 – 2024-01-20 (×8): 237 mL via ORAL

## 2024-01-16 MED ORDER — SODIUM CHLORIDE 0.9% FLUSH
10.0000 mL | Freq: Three times a day (TID) | INTRAVENOUS | Status: DC
  Administered 2024-01-16: 10 mL via INTRAPLEURAL

## 2024-01-16 MED ORDER — STERILE WATER FOR INJECTION IJ SOLN
5.0000 mg | Freq: Once | RESPIRATORY_TRACT | Status: AC
  Administered 2024-01-16: 5 mg via INTRAPLEURAL
  Filled 2024-01-16: qty 5

## 2024-01-16 MED ORDER — METHYLPREDNISOLONE SODIUM SUCC 125 MG IJ SOLR
120.0000 mg | Freq: Two times a day (BID) | INTRAMUSCULAR | Status: DC
  Administered 2024-01-17: 120 mg via INTRAVENOUS
  Filled 2024-01-16: qty 2

## 2024-01-16 MED ORDER — METOPROLOL TARTRATE 5 MG/5ML IV SOLN
5.0000 mg | Freq: Once | INTRAVENOUS | Status: AC
  Administered 2024-01-16: 5 mg via INTRAVENOUS

## 2024-01-16 MED ORDER — ORAL CARE MOUTH RINSE
15.0000 mL | OROMUCOSAL | Status: DC
  Administered 2024-01-17 – 2024-01-21 (×15): 15 mL via OROMUCOSAL

## 2024-01-16 MED ORDER — METHYLPREDNISOLONE SODIUM SUCC 125 MG IJ SOLR
125.0000 mg | Freq: Once | INTRAMUSCULAR | Status: AC
  Administered 2024-01-16: 125 mg via INTRAVENOUS

## 2024-01-16 MED ORDER — METOPROLOL TARTRATE 5 MG/5ML IV SOLN
INTRAVENOUS | Status: AC
  Filled 2024-01-16: qty 5

## 2024-01-16 MED ORDER — CHLORHEXIDINE GLUCONATE CLOTH 2 % EX PADS
6.0000 | MEDICATED_PAD | CUTANEOUS | Status: DC
  Administered 2024-01-16: 6 via TOPICAL

## 2024-01-16 NOTE — Consult Note (Signed)
 NAME:  [name], MRN:  [id number], DOB:  05/04/1956, LOS: 2 ADMISSION DATE:  01/14/2024, CONSULTATION DATE:  01/14/24 REFERRING MD:  Margaree Shark - EM, CHIEF COMPLAINT:  SOB // L pleural effusion   History of Present Illness:  68 yo M PMH PE currently on eliquis , former smoker, metastatic prostate cancer on enzalutamide  and sipuleucel-T , HLD, GERD, HTN who presented to [facility] ED 01/14/24 w CC SOB and dizziness. In ED had a CTA chest which reveals a loculated appearing left pleural effusion, enlarged mediastinal lymph nodes + consolidative L lung changes, no evidence of PE.   PCCM is called in this setting and has recommended admission to medicine with PCCM consultation for management of L pleural effusion and CT placement.  Pertinent Medical History:  PE on Eliquis  HX smoking Metastatic prostate cancer HTN HLD GERD   Significant Hospital Events: Including procedures, antibiotic start and stop dates in addition to other pertinent events   5/22 - [facility] ED w SOB. CTA Chest with loculated appearing left pleural effusion 5/23 - Transferred to [facility] for higher level of care. Eliquis  held, heparin  gtt started.  Interim History / Subjective:  Overall feeling better. Asking when he could go home. Still coughing.   Objective:   Blood pressure 105/71, pulse (!) 114, temperature 97.8 F (36.6 C), temperature source Oral, resp. rate 20, height 6\' 2"  (1.88 m), weight 108.8 kg, SpO2 98%.        Intake/Output Summary (Last 24 hours) at 01/16/2024 3086 Last data filed at 01/15/2024 2300 Gross per 24 hour  Intake 3056.36 ml  Output 2665 ml  Net 391.36 ml   Filed Weights   01/14/24 1133 01/14/24 2133  Weight: 108.9 kg 108.8 kg   Physical Examination: Elderly overweight man, no respiratory distress on room air at present Left sided chest tube in place with serosanguinous drainage Breath sounds diminished left lung base Tachycardic, regular Mild diffuse edema which patient says is baseline   Labs  and imaging personally reviewed  chest xray this morning shows left basilar chest tube with persistence of left sided opacity vs effusion.   Pleural fluid studies consistent with exudative process slight neutrophil predominance, glucose 135.   Resolved problem List:   Assessment and Plan:   Loculated L pleural effusion  Recent PE, on Eliquis  Prostate Cancer on active treatment  Maintain chest tube to suction Await cytology Was treated a month ago with course of doxycycline , failed outpatient therapy.  - Supplemental O2 support for SpO2  > 90%, suspect this can be weaned down given current sats (charted 3LNC).  - Pulmonary hygiene - continue heparin  for now  - Continue broad-spectrum antibiotics (aztreonam, Flagyl , vanc; s/p doxy x 1 dose 5/22) given pcn allergy. - ongoing left basilar opacity means made need intrapleural fibrinolytic therapy today. Will follow output today.   Pulmonary will follow  Louie Rover, MD Pulmonary and Critical Care Medicine [facility] 01/16/2024 11:02 AM Pager: see AMION  If no response to pager, please call critical care on call (see AMION) until 7pm After 7:00 pm call Elink      Labs   CBC: Recent Labs  Lab 01/14/24 1200 01/15/24 0842  WBC 11.8* 11.2*  HGB 9.8* 9.4*  HCT 30.5* 28.7*  MCV 98.4 95.7  PLT 400 402*   Basic Metabolic Panel: Recent Labs  Lab 01/14/24 1200 01/14/24 1409 01/15/24 0842  NA 129*  --  130*  K 4.1  --  4.2  CL 96*  --  99  CO2 20*  --  21*  GLUCOSE 134*  --  149*  BUN 14  --  6*  CREATININE 0.90  --  0.69  CALCIUM  8.9  --  8.6*  MG  --  2.1  --    GFR: Estimated Creatinine Clearance: 116 mL/min (by C-G formula based on SCr of 0.69 mg/dL). Recent Labs  Lab 01/14/24 1200 01/14/24 1209 01/14/24 1423 01/15/24 0842  WBC 11.8*  --   --  11.2*  LATICACIDVEN  --  2.0* 1.2  --    Liver Function Tests: Recent Labs  Lab 01/14/24 1200 01/15/24 1513  AST 28  --   ALT 20  --   ALKPHOS 82  --    BILITOT 0.3  --   PROT 7.5 6.2*  ALBUMIN 2.8*  --    No results for input(s): "LIPASE", "AMYLASE" in the last 168 hours. No results for input(s): "AMMONIA" in the last 168 hours.  ABG:    Component Value Date/Time   PHART 7.344 (L) 11/26/2010 0919   PCO2ART 41.5 11/26/2010 0919   PO2ART 79.0 (L) 11/26/2010 0919   HCO3 22.6 11/26/2010 0919   TCO2 24 11/26/2010 0919   ACIDBASEDEF 3.0 (H) 11/26/2010 0919   O2SAT 95.0 11/26/2010 0919

## 2024-01-16 NOTE — Progress Notes (Signed)
 eLink Physician-Brief Progress Note Patient [name] DOB: 14-Feb-1956 MRN: [id number]   Date of Service  01/16/2024  HPI/Events of Note  Patient is a 68 year old male with a history of prostate cancer, COPD, emphysema who presented with left-sided pleural effusions and atelectasis with chest tube 2 days ago now receiving pleural thrombolytics-he developed sudden onset shortness of breath and was transferred to the ICU on BiPAP for further management of his exacerbation.  Patient is febrile, tachypneic, tachycardic and hypertensive with saturation 97% on 30% FiO2.  Adequate oxygenation and ventilation on BiPAP.  Results this morning with normocytic anemia, leukocytosis, and stable left-sided pleural catheter.  eICU Interventions  Being treated as an acute exacerbation of COPD with albuterol , Solu-Medrol, and ongoing chest tube with BiPAP.  Maintain heparin  drip in the setting of DVT/PE  Panculture and antibiotics already in place  DVT prophylaxis with therapeutic heparin  GI prophylaxis currently not indicated   0008 -add sliding scale insulin  0151 - back pain 5/10 - repeat morphine  x1  0505 - substantial drainage from the chest tube without evidence of respiratory distress or negative pressure pulmonary edema.  Maintaining drainage,     [name] 01/16/2024, 9:06 PM

## 2024-01-16 NOTE — Significant Event (Addendum)
 Rapid Response Event Note   Reason for Call :  SOB  Pt here with L pl effusion s/p L pigtail CT s/p lytics today at 1430.  Per RN, pt has haD SOB t/o the day that has worsened.   Initial Focused Assessment:  Pt sitting up in bed in the tripod position. His breathing is tachypneic and labored. He is alert and oriented, says his SOB has been getting progressively worse t/o the day. He denies CP at this time. Lungs with rhonchi and diminished t/o. L pigtail CT to sx draining sang drainage after unclamped from lytic administration done at 1430 today. No air leak noted. ABD large, soft. Skin hot and dry with mottling to BLE.  T-101.4, HR-122(ST), BP-+168/94, RR-50s, SpO2-100% on NRB.   Pt placed on bipap with no change in resp distress. PCCM notified and to bedside.   Interventions: PCXR-interval retraction of the pigtail catheter at the left lung base. No pneumothorax. No significant oval change when adjusted for technique otherwise. Bipap>rr-40s, SpO2-100% EKG-ST ABG-7.44/28/89/19 PCCM to bedside CBC CMP LA Trop Albuterol  tx Solu-medrol 125mg  Rac epi UA/cx, BC X 2 Tx to 15M. Plan of Care:  Pt tx to 15M06 with 4E RN, RRT, RR RN, PCCM MD, and PCCM NP.  Event Summary:   MD Notified: Dr. Achilles Holes notified. PCCM MD Mason Sole and NP Felipe Horton notified and came to bedside Call Time:1907 Arrival Time:1919 End Time:2052  Dorrine Gaudy, RN

## 2024-01-16 NOTE — Progress Notes (Signed)
 Patient placed on bipap

## 2024-01-16 NOTE — Progress Notes (Signed)
 Received report on patient.  On assessment patient in respiratory distress tripoding sitting on edge of bed.  HR 130's.  RRT called to bedside

## 2024-01-16 NOTE — Progress Notes (Signed)
 PROGRESS NOTE    [name]  KVQ:[id number] DOB: August 10, 1956 DOA: 01/14/2024 PCP: [name], MD   Brief Narrative:  This 68 yrs old male with PMH significant for hypertension, hyperlipidemia, GERD, metastatic prostate cancer followed by Dr. [name], on enzalutamide  and sipuleucel-T , recent hospitalization where he was diagnosed with PE, started on Eliquis , presents to ED secondary to shortness of breath, fatigue, dizziness, lightheadedness, worsening dyspnea over the last month. In ED lactic acid at 2.0, white blood cell count at 11.8, CTA chest obtained, no evidence of PE, but significant for left lung base consolidation with significant loculated pleural effusion,.  Patient was admitted for further evaluation.  Pulmonology is consulted.   Assessment & Plan:   Principal Problem:   Loculated pleural effusion Active Problems:   Essential hypertension   Obesity (BMI 30-39.9)   COPD (chronic obstructive pulmonary disease) with chronic bronchitis (HCC)   GERD (gastroesophageal reflux disease)   Type 2 diabetes mellitus with obesity (HCC)   Prostate cancer metastatic to bone (HCC)   Pulmonary embolism (HCC)   Pleural effusion, left   History of pulmonary embolism   Loculated left pleural effusion: Sepsis due to HCAP: Patient presented with sepsis criteria (elevated lactic acid, leukocytosis, tachypnea and tachycardia) There is high concern for aspiration, his wife reports significant coughing / choking when eating.  Continue on dysphagia diet, Speech and swallow evaluation. Initiated on empiric antibiotics ( Vancomycin , Azactam and Flagyl  ) Encourage use of  incentive spirometry and flutter valve. Pulmonology consulted , patient underwent successful chest tube insertion. Maintain chest tube to suction,  awaiting cytology. Continue heparin .  Continue supplemental oxygen. Patient will likely need intrapleural fibrinolytic therapy today  Pulmonary embolism: Diagnosed with PE and  started on Eliquis  upon recent hospitalization.   Has been compliant with this medication. Patient placed on heparin  as he might have required chest tube insertion.   Prolonged QT interval: Hold  QT prolonging agents for now. Hold Abilify , Plaquenil, Effexor and desipramine   Essential hypertension: Resume home meds   Mixed hyperlipidemia: Continue Crestor , fenofibrate .   GERD: Continue Protonix .   Metastatic prostate cancer: Patient follows with Dr. [name], [name] For now we will hold his Xtandi  due to acute infection   Rheumatoid  arthritis: -hold Plaquenil due to prolonged QTc   DVT prophylaxis:Heparin  sq Code Status: Full code Family Communication:No family at bed side. Disposition Plan:   Status is: Inpatient Remains inpatient appropriate because: Severity of illness.   Consultants:  Pulmonology  Procedures: None  Antimicrobials:  Anti-infectives (From admission, onward)    Start     Dose/Rate Route Frequency Ordered Stop   01/15/24 1000  vancomycin  (VANCOCIN ) IVPB 1000 mg/200 mL premix        1,000 mg 200 mL/hr over 60 Minutes Intravenous Every 12 hours 01/14/24 2030     01/15/24 0000  aztreonam (AZACTAM) 2 g in sodium chloride  0.9 % 100 mL IVPB        2 g 200 mL/hr over 30 Minutes Intravenous Every 8 hours 01/14/24 2030     01/14/24 2100  metroNIDAZOLE  (FLAGYL ) IVPB 500 mg        500 mg 100 mL/hr over 60 Minutes Intravenous Every 12 hours 01/14/24 2001     01/14/24 2100  vancomycin  (VANCOCIN ) IVPB 1000 mg/200 mL premix        1,000 mg 200 mL/hr over 60 Minutes Intravenous  Once 01/14/24 2030 01/15/24 0028   01/14/24 1730  vancomycin  (VANCOCIN ) IVPB 1000 mg/200 mL premix  1,000 mg 200 mL/hr over 60 Minutes Intravenous  Once 01/14/24 1715 01/14/24 1937   01/14/24 1600  doxycycline  (VIBRAMYCIN ) 100 mg in sodium chloride  0.9 % 250 mL IVPB        100 mg 125 mL/hr over 120 Minutes Intravenous  Once 01/14/24 1518 01/14/24 1939   01/14/24 1530   aztreonam (AZACTAM) 2 g in sodium chloride  0.9 % 100 mL IVPB        2 g 200 mL/hr over 30 Minutes Intravenous  Once 01/14/24 1519 01/14/24 1654      Subjective: Patient was seen and examined at bedside.  Overnight events noted. Patient was sitting comfortably on the chair, with chest tube on the left side,  States he is feeling better.   Objective: Vitals:   01/15/24 2310 01/16/24 0319 01/16/24 0729 01/16/24 1115  BP: 124/71 131/72 105/71 100/65  Pulse: (!) 115 (!) 107 (!) 114 (!) 117  Resp: 20 20 20  (!) 39  Temp: 99.8 F (37.7 C) 98 F (36.7 C) 97.8 F (36.6 C) 97.6 F (36.4 C)  TempSrc: Oral Oral Oral Oral  SpO2: 99% 100% 98% 97%  Weight:      Height:        Intake/Output Summary (Last 24 hours) at 01/16/2024 1144 Last data filed at 01/16/2024 0852 Gross per 24 hour  Intake 2616.36 ml  Output 3390 ml  Net -773.64 ml   Filed Weights   01/14/24 1133 01/14/24 2133  Weight: 108.9 kg 108.8 kg    Examination:  General exam: Appears calm and comfortable, not in any acute distress. Respiratory system: Decreased breath sounds. Respiratory effort normal.  RR 15, Left sided chest tube. Cardiovascular system: S1 & S2 heard, RRR. No JVD, murmurs, rubs, gallops or clicks. Gastrointestinal system: Abdomen is non distended, soft and non tender.  Normal bowel sounds heard. Central nervous system: Alert and oriented x 3. No focal neurological deficits. Extremities: No edema, no cyanosis, no clubbing. Skin: No rashes, lesions or ulcers Psychiatry: Judgement and insight appear normal. Mood & affect appropriate.     Data Reviewed: I have personally reviewed following labs and imaging studies  CBC: Recent Labs  Lab 01/14/24 1200 01/15/24 0842 01/16/24 0803  WBC 11.8* 11.2* 14.4*  HGB 9.8* 9.4* 9.4*  HCT 30.5* 28.7* 29.9*  MCV 98.4 95.7 98.0  PLT 400 402* 407*   Basic Metabolic Panel: Recent Labs  Lab 01/14/24 1200 01/14/24 1409 01/15/24 0842 01/16/24 0803  NA 129*   --  130* 130*  K 4.1  --  4.2 4.2  CL 96*  --  99 100  CO2 20*  --  21* 21*  GLUCOSE 134*  --  149* 174*  BUN 14  --  6* 10  CREATININE 0.90  --  0.69 0.73  CALCIUM  8.9  --  8.6* 8.7*  MG  --  2.1  --  2.0  PHOS  --   --   --  3.0   GFR: Estimated Creatinine Clearance: 116 mL/min (by C-G formula based on SCr of 0.73 mg/dL). Liver Function Tests: Recent Labs  Lab 01/14/24 1200 01/15/24 1513  AST 28  --   ALT 20  --   ALKPHOS 82  --   BILITOT 0.3  --   PROT 7.5 6.2*  ALBUMIN 2.8*  --    No results for input(s): "LIPASE", "AMYLASE" in the last 168 hours. No results for input(s): "AMMONIA" in the last 168 hours. Coagulation Profile: No results for input(s): "INR", "PROTIME"  in the last 168 hours. Cardiac Enzymes: No results for input(s): "CKTOTAL", "CKMB", "CKMBINDEX", "TROPONINI" in the last 168 hours. BNP (last 3 results) No results for input(s): "PROBNP" in the last 8760 hours. HbA1C: No results for input(s): "HGBA1C" in the last 72 hours. CBG: No results for input(s): "GLUCAP" in the last 168 hours. Lipid Profile: No results for input(s): "CHOL", "HDL", "LDLCALC", "TRIG", "CHOLHDL", "LDLDIRECT" in the last 72 hours. Thyroid  Function Tests: No results for input(s): "TSH", "T4TOTAL", "FREET4", "T3FREE", "THYROIDAB" in the last 72 hours. Anemia Panel: No results for input(s): "VITAMINB12", "FOLATE", "FERRITIN", "TIBC", "IRON", "RETICCTPCT" in the last 72 hours. Sepsis Labs: Recent Labs  Lab 01/14/24 1209 01/14/24 1423  LATICACIDVEN 2.0* 1.2    Recent Results (from the past 240 hours)  Resp panel by RT-PCR (RSV, Flu A&B, Covid) Anterior Nasal Swab     Status: None   Collection Time: 01/14/24 12:09 PM   Specimen: Anterior Nasal Swab  Result Value Ref Range Status   SARS Coronavirus 2 by RT PCR NEGATIVE NEGATIVE Final    Comment: (NOTE) SARS-CoV-2 target nucleic acids are NOT DETECTED.  The SARS-CoV-2 RNA is generally detectable in upper respiratory specimens  during the acute phase of infection. The lowest concentration of SARS-CoV-2 viral copies this assay can detect is 138 copies/mL. A negative result does not preclude SARS-Cov-2 infection and should not be used as the sole basis for treatment or other patient management decisions. A negative result may occur with  improper specimen collection/handling, submission of specimen other than nasopharyngeal swab, presence of viral mutation(s) within the areas targeted by this assay, and inadequate number of viral copies(<138 copies/mL). A negative result must be combined with clinical observations, patient history, and epidemiological information. The expected result is Negative.  Fact Sheet for Patients:  BloggerCourse.com  Fact Sheet for Healthcare Providers:  SeriousBroker.it  This test is no t yet approved or cleared by the [place]  FDA and  has been authorized for detection and/or diagnosis of SARS-CoV-2 by FDA under an Emergency Use Authorization (EUA). This EUA will remain  in effect (meaning this test can be used) for the duration of the COVID-19 declaration under Section 564(b)(1) of the Act, 21 U.S.C.section 360bbb-3(b)(1), unless the authorization is terminated  or revoked sooner.       Influenza A by PCR NEGATIVE NEGATIVE Final   Influenza B by PCR NEGATIVE NEGATIVE Final    Comment: (NOTE) The Xpert Xpress SARS-CoV-2/FLU/RSV plus assay is intended as an aid in the diagnosis of influenza from Nasopharyngeal swab specimens and should not be used as a sole basis for treatment. Nasal washings and aspirates are unacceptable for Xpert Xpress SARS-CoV-2/FLU/RSV testing.  Fact Sheet for Patients: BloggerCourse.com  Fact Sheet for Healthcare Providers: SeriousBroker.it  This test is not yet approved or cleared by the [place]  FDA and has been authorized for detection  and/or diagnosis of SARS-CoV-2 by FDA under an Emergency Use Authorization (EUA). This EUA will remain in effect (meaning this test can be used) for the duration of the COVID-19 declaration under Section 564(b)(1) of the Act, 21 U.S.C. section 360bbb-3(b)(1), unless the authorization is terminated or revoked.     Resp Syncytial Virus by PCR NEGATIVE NEGATIVE Final    Comment: (NOTE) Fact Sheet for Patients: BloggerCourse.com  Fact Sheet for Healthcare Providers: SeriousBroker.it  This test is not yet approved or cleared by the [place]  FDA and has been authorized for detection and/or diagnosis of SARS-CoV-2 by FDA under an Emergency Use Authorization (EUA).  This EUA will remain in effect (meaning this test can be used) for the duration of the COVID-19 declaration under Section 564(b)(1) of the Act, 21 U.S.C. section 360bbb-3(b)(1), unless the authorization is terminated or revoked.  Performed at [facility], [address], [place], [place] [address]   Culture, blood (routine x 2)     Status: None (Preliminary result)   Collection Time: 01/14/24  5:00 PM   Specimen: BLOOD  Result Value Ref Range Status   Specimen Description BLOOD BLOOD RIGHT HAND  Final   Special Requests   Final    BOTTLES DRAWN AEROBIC AND ANAEROBIC Blood Culture adequate volume   Culture   Final    NO GROWTH 2 DAYS Performed at [facility], [address], [place], [place] [address]    Report Status PENDING  Incomplete  Culture, blood (routine x 2)     Status: None (Preliminary result)   Collection Time: 01/14/24  5:47 PM   Specimen: BLOOD  Result Value Ref Range Status   Specimen Description BLOOD RIGHT ANTECUBITAL  Final   Special Requests   Final    BOTTLES DRAWN AEROBIC ONLY Blood Culture adequate volume   Culture   Final    NO GROWTH 2 DAYS Performed at [facility], [address] [place], [place] [address]    Report Status PENDING   Incomplete  MRSA Next Gen by PCR, Nasal     Status: None   Collection Time: 01/15/24 12:12 AM   Specimen: Nasal Mucosa; Nasal Swab  Result Value Ref Range Status   MRSA by PCR Next Gen NOT DETECTED NOT DETECTED Final    Comment: (NOTE) The GeneXpert MRSA Assay (FDA approved for NASAL specimens only), is one component of a comprehensive MRSA colonization surveillance program. It is not intended to diagnose MRSA infection nor to guide or monitor treatment for MRSA infections. Test performance is not FDA approved in patients less than 68 years old. Performed at [facility], [address] [place], [place] [address]   Body fluid culture w Gram Stain     Status: None (Preliminary result)   Collection Time: 01/15/24  2:06 PM   Specimen: Pleural Fluid  Result Value Ref Range Status   Specimen Description PLEURAL  Final   Special Requests left  Final   Gram Stain NO WBC SEEN NO ORGANISMS SEEN   Final   Culture   Final    NO GROWTH < 24 HOURS Performed at [facility], [address] [place], [place] [address]    Report Status PENDING  Incomplete    Radiology Studies: DG Chest Port 1 View Result Date: 01/16/2024 CLINICAL DATA:  Chest tube EXAM: PORTABLE CHEST 1 VIEW COMPARISON:  Chest x-ray performed Jan 15, 2024 FINDINGS: A left basilar thoracostomy is present. Persistent opacification of the left lung base. Mild diffuse interstitial prominence. Heart mediastinum are not significantly changed. No pneumothorax. IMPRESSION: 1. Left-sided thoracostomy tube without evidence of pneumothorax. 2. When compared to the prior exam, similar appearance of left basilar consolidation and pleural effusion. 3. No significant interval change. Electronically Signed   By: Reagan Camera M.D.   On: 01/16/2024 08:39   DG CHEST PORT 1 VIEW Result Date: 01/15/2024 CLINICAL DATA:  Left chest tube EXAM: PORTABLE CHEST 1 VIEW COMPARISON:  01/14/2024 FINDINGS: Single frontal view of the chest  demonstrates a stable cardiac silhouette. Interval placement of a left chest tube, overlying the medial left lung base. Marked decrease in the multilocular  left pleural effusion seen previously. Persistent areas of consolidation are seen within the left perihilar region, most pronounced in the left upper lobe. Stable emphysema. No pneumothorax. No acute bony abnormalities. IMPRESSION: 1. Marked decrease in the multilocular left pleural effusion after left chest tube placement. 2. Persistent left perihilar consolidation, greatest in the left upper lobe, consistent with pneumonia or atelectasis. Electronically Signed   By: Bobbye Burrow M.D.   On: 01/15/2024 16:18   CT Angio Chest PE W/Cm &/Or Wo Cm Result Date: 01/14/2024 CLINICAL DATA:  Pulmonary embolism EXAM: CT ANGIOGRAPHY CHEST WITH CONTRAST TECHNIQUE: Multidetector CT imaging of the chest was performed using the standard protocol during bolus administration of intravenous contrast. Multiplanar CT image reconstructions and MIPs were obtained to evaluate the vascular anatomy. Multiplanar image (3D post-processing) reconstructions and MIPs were obtained to evaluate the vascular anatomy. RADIATION DOSE REDUCTION: This exam was performed according to the departmental dose-optimization program which includes automated exposure control, adjustment of the mA and/or kV according to patient size and/or use of iterative reconstruction technique. CONTRAST:  75mL OMNIPAQUE  IOHEXOL  350 MG/ML SOLN COMPARISON:  CT of the chest performed December 01, 2023. PET-CT performed Dec 31, 2023 FINDINGS: Cardiovascular: Normal size heart. No pericardial effusion. Three vessel aortic arch. The main pulmonary artery is within normal limits for size. No evidence of pulmonary embolism to the proximal segmental level. Mediastinum/Nodes: Enlarged lymph nodes are present within the mediastinum. A representative lymph node anterior to the thoracic aorta measures 1.4 cm in short axis dimension on  image 37 of series 4. Soft tissue thickening is also increased along the left medial pleural surface, left mediastinum and left hilum. Lungs/Pleura: There is a loculated left-sided pleural effusion. This is moderate in size and worst in the left lung base. Consolidative changes are present in the left lung particularly in the medial left upper lobe and left lower lobe. Underlying changes of chronic lung disease are present with cystic lucencies which are upper lobe and peripheral predominant. Peribronchial thickening is also noted. Upper Abdomen: No acute abnormality. Musculoskeletal: No chest wall abnormality. No acute or significant osseous findings. Review of the MIP images confirms the above findings. IMPRESSION: 1. Increase in abnormal soft tissue thickening along the medial pleural surface of the left lung. 2. Moderate loculated left pleural effusion. 3. Increasing mediastinal lymphadenopathy. 4. No evidence of pulmonary embolism to the proximal segmental level. 5. Scattered consolidative changes are also present in the left lung which may represent superimposed infection. Electronically Signed   By: Reagan Camera M.D.   On: 01/14/2024 16:56   DG Chest Port 1 View Result Date: 01/14/2024 CLINICAL DATA:  Shortness of breath, dizziness. EXAM: PORTABLE CHEST 1 VIEW COMPARISON:  December 01, 2023. FINDINGS: Stable cardiomediastinal silhouette. Stable minimal right basilar scarring or subsegmental atelectasis. Increased left basilar opacity is noted concerning for worsening atelectasis or pneumonia with small left pleural effusion. Bony thorax is unremarkable. IMPRESSION: Increased left basilar opacity is noted concerning for worsening atelectasis or pneumonia with small left pleural effusion. Electronically Signed   By: Rosalene Colon M.D.   On: 01/14/2024 13:56   Scheduled Meds:  famotidine   20 mg Oral Q supper   ferrous sulfate  325 mg Oral Daily   furosemide  20 mg Oral Daily   irbesartan  150 mg Oral  Daily   pantoprazole   40 mg Oral QAC breakfast   rosuvastatin   40 mg Oral Daily   sodium chloride  flush  10 mL Intrapleural Q8H   Continuous  Infusions:  aztreonam  2 g (01/16/24 0850)   heparin  2,000 Units/hr (01/16/24 0155)   metronidazole  500 mg (01/16/24 1114)   vancomycin  1,000 mg (01/16/24 1110)     LOS: 2 days    Time spent: 35 mins    Magdalene School, MD [facility]   If 7PM-7AM, please contact night-coverage

## 2024-01-16 NOTE — Progress Notes (Signed)
 Patient transported to ICU 2M06 with bipap.  Report given to receiving nurse.

## 2024-01-16 NOTE — Progress Notes (Addendum)
 Progress Note  Called by Rapid Response Team that patient is tripoding and very SOB.  He was sitting on side of the bed and very SOB.   He is a 68 y/o male with h/o metastatic prostate cancer, COPD/Emphysema who presented with left sided pleural effusion/atelectasis and had a chest tube placed 2 days ago.  Today TPA was placed into chest tube and the tube was clamped and unclamped about 1-2 hours.  The chest tube is working and sanguinous fluid draining.  He started getting significantly SOB around 730pm but had been progressively getting worse over the course of the day. He was placed on BiPAP and resp rate remained 40's, he did not desat and his BP remained stable. BP (!) 168/94   Pulse (!) 122   Temp 97.7 F (36.5 C) (Oral)   Resp (!) 30   Ht 6\' 2"  (1.88 m)   Wt 108.8 kg   SpO2 100%   BMI 30.80 kg/m   On exam Patient in significant respiratory distress Rhoncherous sounds in the neck referred to upper chest Chest is diminished and no wheezes or rales Reg tachy s1s2 Soft nt nd bs pos No cyanosis, clubbing, mild mottled LE min edema AAOx3, CN II to XII grossly intact  A/p Acute respiratory failure-on BiPAP for now, ABG sent await results, cxr not much difference ?COPD exacerbation/acute bronchospasm vs upper airways problem such as acute Laryngeo spasm -Albuterol  neb and Racemic Epi neb -125mg  Solumedrol IVP -continue with nebs and steroids -Intubation if not better Sepsis? Has fever now-will pan culture, currently on multiple antibiotics H/o PE/DVT on Heparin  drip Left hemothorax-continue with chest tube drainage  Transferred to ICU The patient is critically ill with multiple organ system failure and requires high complexity decision making for assessment and support, frequent evaluation and titration of therapies, advanced monitoring, review of radiographic studies and interpretation of complex data.   Critical Care Time devoted to patient care services, exclusive of separately  billable procedures, described in this note is 40 minutes.  Claven Cumming, MD [facility] See Amion for pager  If no response to pager , please call [phone] until 7pm After 7:00 pm call Elink  [phone] 01/16/2024, 8:52 PM

## 2024-01-16 NOTE — Plan of Care (Signed)

## 2024-01-16 NOTE — Progress Notes (Signed)
 PHARMACY - ANTICOAGULATION CONSULT NOTE  Pharmacy Consult for heparin  Indication: recent PE  Labs: Recent Labs    01/14/24 1200 01/15/24 0842 01/16/24 0047  HGB 9.8* 9.4*  --   HCT 30.5* 28.7*  --   PLT 400 402*  --   APTT  --  53* 56*  HEPARINUNFRC  --  0.59  --   CREATININE 0.90 0.69  --   TROPONINIHS 5  --   --    Assessment: 68yo male subtherapeutic on heparin  after resumed s/p CT placement; no infusion issues per RN though per report to her there was bloody outpt from CT when it was placed though no output since.  Goal of Therapy:  aPTT 66-102 seconds   Plan:  Increase heparin  infusion by 2 units/kg/hr to 2000 units/hr. Check PTT in 6 hours.   [name], PharmD, BCPS 01/16/2024 1:54 AM

## 2024-01-16 NOTE — Progress Notes (Signed)
 Dr Elsworth Halt informed of [place] at 1355 5/24

## 2024-01-16 NOTE — Progress Notes (Signed)
 PHARMACY - ANTICOAGULATION CONSULT NOTE  Pharmacy Consult for Heparin  (Eliquis  on hold) Indication: hx pulmonary embolus 12/01/2023  Allergies  Allergen Reactions   Penicillins Anaphylaxis         Patient Measurements: Height: 6\' 2"  (188 cm) Weight: 108.8 kg (239 lb 13.8 oz) IBW/kg (Calculated) : 82.2 HEPARIN  DW (KG): 104.6  Vital Signs: Temp: 97.8 F (36.6 C) (05/24 0729) Temp Source: Oral (05/24 0729) BP: 105/71 (05/24 0729) Pulse Rate: 114 (05/24 0729)  Labs: Recent Labs    01/14/24 1200 01/15/24 0842 01/16/24 0047 01/16/24 0803  HGB 9.8* 9.4*  --  9.4*  HCT 30.5* 28.7*  --  29.9*  PLT 400 402*  --  407*  APTT  --  53* 56* 60*  HEPARINUNFRC  --  0.59  --  0.39  CREATININE 0.90 0.69  --   --   TROPONINIHS 5  --   --   --     Estimated Creatinine Clearance: 116 mL/min (by C-G formula based on SCr of 0.69 mg/dL).  Assessment: 68 y.o. male w/ PMH of HTN, HLD, GERD, metastatic prostate cancer, h/o PE 12/01/23 (started on Eliquis ), presenting with loculated pleural effusion. Last dose of Eliquis  01/14/24 at 0800. Transitioned to heparin  drip in anticipation of procedure for loculated pleural effusion. Will use aPTTs for monitoring while heparin  levels are expected to be falsely elevated due to recent Eliquis  doses.  aPTT subtherapeutic (60) and heparin  level not yet correlating (0.39). CBC stable. No s/sx of bleeding per RN but notes the heparin  infusion had been paused for unknown duration, could have been up to 1-1.5hrs. RN just restarted at ~1110. Heparin  was running when I visited the patient this AM right after the levels were drawn, therefore no further action needed.  Goal of Therapy:  Heparin  level 0.3-0.7 units/ml aPTT 66-102 seconds Monitor platelets by anticoagulation protocol: Yes   Plan:  Increase heparin  gtt to 2200 units/hr F/u aPTT in 6hrs Daily aPTT and heparin  level until correlating; daily CBC. Monitor for signs and symptoms of bleeding. F/u plans  to resume PTA Eliquis   Marybelle Smiling, RPh 01/16/2024,9:27 AM

## 2024-01-16 NOTE — Progress Notes (Signed)
 RT transported pt from 4E11 to 2M06 with RN and Rapid at bedside. No complications at this time.

## 2024-01-16 NOTE — Progress Notes (Signed)
 PHARMACY - ANTICOAGULATION CONSULT NOTE  Pharmacy Consult for Heparin  (Eliquis  on hold) Indication: hx pulmonary embolus 12/01/2023  Allergies  Allergen Reactions   Penicillins Anaphylaxis         Patient Measurements: Height: 6\' 2"  (188 cm) Weight: 108.8 kg (239 lb 13.8 oz) IBW/kg (Calculated) : 82.2 HEPARIN  DW (KG): 104.6  Vital Signs: Temp: 97.7 F (36.5 C) (05/24 1751) Temp Source: Oral (05/24 1751) BP: 168/94 (05/24 1924) Pulse Rate: 129 (05/24 1924)  Labs: Recent Labs    01/14/24 1200 01/14/24 1200 01/15/24 0842 01/16/24 0047 01/16/24 0803 01/16/24 1837  HGB 9.8*  --  9.4*  --  9.4*  --   HCT 30.5*  --  28.7*  --  29.9*  --   PLT 400  --  402*  --  407*  --   APTT  --    < > 53* 56* 60* 43*  HEPARINUNFRC  --   --  0.59  --  0.39  --   CREATININE 0.90  --  0.69  --  0.73  --   TROPONINIHS 5  --   --   --   --   --    < > = values in this interval not displayed.    Estimated Creatinine Clearance: 116 mL/min (by C-G formula based on SCr of 0.73 mg/dL).  Assessment: 68 y.o. male w/ PMH of HTN, HLD, GERD, metastatic prostate cancer, h/o PE 12/01/23 (started on Eliquis ), presenting with loculated pleural effusion. Last dose of Eliquis  01/14/24 at 0800. Transitioned to heparin  drip in anticipation of procedure for loculated pleural effusion. Will use aPTTs for monitoring while heparin  levels are expected to be falsely elevated due to recent Eliquis  doses.  aPTT subtherapeutic (43) and heparin  level not yet correlating (0.39). CBC stable. No s/sx of bleeding per RN. No issues with line or heparin  per RN. Unclear what is causing decreased aPTT in this patient.   Goal of Therapy:  Heparin  level 0.3-0.7 units/ml aPTT 66-102 seconds Monitor platelets by anticoagulation protocol: Yes   Plan:  Increase heparin  gtt to 2600 units/hr F/u aPTT in 6hrs Daily aPTT and heparin  level until correlating; daily CBC. Monitor for signs and symptoms of bleeding. F/u plans to resume  PTA Eliquis   Saunders Curio, RPh 01/16/2024,7:27 PM

## 2024-01-17 ENCOUNTER — Inpatient Hospital Stay (HOSPITAL_COMMUNITY)

## 2024-01-17 ENCOUNTER — Encounter (HOSPITAL_COMMUNITY): Payer: Self-pay | Admitting: Emergency Medicine

## 2024-01-17 DIAGNOSIS — C61 Malignant neoplasm of prostate: Secondary | ICD-10-CM | POA: Diagnosis not present

## 2024-01-17 DIAGNOSIS — J90 Pleural effusion, not elsewhere classified: Secondary | ICD-10-CM | POA: Diagnosis not present

## 2024-01-17 LAB — LACTIC ACID, PLASMA
Lactic Acid, Venous: 1.8 mmol/L (ref 0.5–1.9)
Lactic Acid, Venous: 2.6 mmol/L (ref 0.5–1.9)
Lactic Acid, Venous: 3.1 mmol/L (ref 0.5–1.9)

## 2024-01-17 LAB — GLUCOSE, CAPILLARY
Glucose-Capillary: 194 mg/dL — ABNORMAL HIGH (ref 70–99)
Glucose-Capillary: 206 mg/dL — ABNORMAL HIGH (ref 70–99)
Glucose-Capillary: 196 mg/dL — ABNORMAL HIGH (ref 70–99)
Glucose-Capillary: 219 mg/dL — ABNORMAL HIGH (ref 70–99)
Glucose-Capillary: 150 mg/dL — ABNORMAL HIGH (ref 70–99)
Glucose-Capillary: 147 mg/dL — ABNORMAL HIGH (ref 70–99)

## 2024-01-17 LAB — BASIC METABOLIC PANEL WITH GFR
Anion gap: 11 (ref 5–15)
BUN: 16 mg/dL (ref 8–23)
CO2: 21 mmol/L — ABNORMAL LOW (ref 22–32)
Calcium: 8.5 mg/dL — ABNORMAL LOW (ref 8.9–10.3)
Chloride: 100 mmol/L (ref 98–111)
Creatinine, Ser: 1.16 mg/dL (ref 0.61–1.24)
GFR, Estimated: >60 mL/min (ref >60)
Glucose, Bld: 226 mg/dL — ABNORMAL HIGH (ref 70–99)
Potassium: 4.5 mmol/L (ref 3.5–5.1)
Sodium: 132 mmol/L — ABNORMAL LOW (ref 135–145)

## 2024-01-17 LAB — URINALYSIS, ROUTINE W REFLEX MICROSCOPIC
Bilirubin Urine: NEGATIVE
Glucose, UA: 50 mg/dL — AB
Hgb urine dipstick: NEGATIVE
Ketones, ur: 5 mg/dL — AB
Leukocytes,Ua: NEGATIVE
Nitrite: NEGATIVE
Protein, ur: 30 mg/dL — AB
Specific Gravity, Urine: 1.024 (ref 1.005–1.030)
pH: 5 (ref 5.0–8.0)

## 2024-01-17 LAB — CBC
HCT: 28.3 % — ABNORMAL LOW (ref 39.0–52.0)
Hemoglobin: 9 g/dL — ABNORMAL LOW (ref 13.0–17.0)
MCH: 30.4 pg (ref 26.0–34.0)
MCHC: 31.8 g/dL (ref 30.0–36.0)
MCV: 95.6 fL (ref 80.0–100.0)
Platelets: 431 10*3/uL — ABNORMAL HIGH (ref 150–400)
RBC: 2.96 MIL/uL — ABNORMAL LOW (ref 4.22–5.81)
RDW: 14.3 % (ref 11.5–15.5)
WBC: 17.9 10*3/uL — ABNORMAL HIGH (ref 4.0–10.5)
nRBC: 0 % (ref 0.0–0.2)

## 2024-01-17 LAB — HEMOGLOBIN A1C
Hgb A1c MFr Bld: 6.1 % — ABNORMAL HIGH (ref 4.8–5.6)
Mean Plasma Glucose: 128.37 mg/dL

## 2024-01-17 LAB — TROPONIN I (HIGH SENSITIVITY): Troponin I (High Sensitivity): 15 ng/L (ref <18)

## 2024-01-17 LAB — APTT
aPTT: 174 s (ref 24–36)
aPTT: >200 s (ref 24–36)
aPTT: 116 s — ABNORMAL HIGH (ref 24–36)

## 2024-01-17 LAB — VANCOMYCIN, PEAK: Vancomycin Pk: 29 ug/mL — ABNORMAL LOW (ref 30–40)

## 2024-01-17 LAB — HEPARIN LEVEL (UNFRACTIONATED)
Heparin Unfractionated: 0.79 [IU]/mL — ABNORMAL HIGH (ref 0.30–0.70)
Heparin Unfractionated: 1.08 [IU]/mL — ABNORMAL HIGH (ref 0.30–0.70)

## 2024-01-17 LAB — VANCOMYCIN, TROUGH: Vancomycin Tr: 17 ug/mL (ref 15–20)

## 2024-01-17 MED ORDER — CHLORHEXIDINE GLUCONATE CLOTH 2 % EX PADS
6.0000 | MEDICATED_PAD | Freq: Every morning | CUTANEOUS | Status: DC
  Administered 2024-01-17 – 2024-01-18 (×2): 6 via TOPICAL

## 2024-01-17 MED ORDER — HEPARIN (PORCINE) 25000 UT/250ML-% IV SOLN
2000.0000 [IU]/h | INTRAVENOUS | Status: DC
  Administered 2024-01-17 – 2024-01-18 (×3): 2000 [IU]/h via INTRAVENOUS
  Filled 2024-01-17 (×2): qty 250

## 2024-01-17 MED ORDER — MORPHINE SULFATE (PF) 2 MG/ML IV SOLN
1.0000 mg | Freq: Once | INTRAVENOUS | Status: AC
  Administered 2024-01-17: 1 mg via INTRAVENOUS
  Filled 2024-01-17: qty 1

## 2024-01-17 MED ORDER — SODIUM CHLORIDE 0.9% FLUSH
10.0000 mL | Freq: Three times a day (TID) | INTRAVENOUS | Status: DC
Start: 2024-01-17 — End: 2024-01-21
  Administered 2024-01-17 – 2024-01-19 (×5): 10 mL via INTRAPLEURAL

## 2024-01-17 MED ORDER — INSULIN ASPART 100 UNIT/ML IJ SOLN
0.0000 [IU] | INTRAMUSCULAR | Status: DC
  Administered 2024-01-17 (×2): 3 [IU] via SUBCUTANEOUS
  Administered 2024-01-17: 2 [IU] via SUBCUTANEOUS
  Administered 2024-01-17: 3 [IU] via SUBCUTANEOUS
  Administered 2024-01-17 (×2): 5 [IU] via SUBCUTANEOUS
  Administered 2024-01-18: 3 [IU] via SUBCUTANEOUS
  Administered 2024-01-18 (×5): 2 [IU] via SUBCUTANEOUS
  Administered 2024-01-18 – 2024-01-19 (×2): 3 [IU] via SUBCUTANEOUS
  Administered 2024-01-19 (×2): 2 [IU] via SUBCUTANEOUS
  Administered 2024-01-19 (×2): 3 [IU] via SUBCUTANEOUS
  Administered 2024-01-19: 2 [IU] via SUBCUTANEOUS
  Administered 2024-01-20 (×2): 3 [IU] via SUBCUTANEOUS
  Administered 2024-01-20: 5 [IU] via SUBCUTANEOUS
  Administered 2024-01-20: 2 [IU] via SUBCUTANEOUS
  Administered 2024-01-21: 3 [IU] via SUBCUTANEOUS
  Administered 2024-01-21 (×2): 2 [IU] via SUBCUTANEOUS
  Administered 2024-01-21: 3 [IU] via SUBCUTANEOUS

## 2024-01-17 MED ORDER — MORPHINE SULFATE (PF) 2 MG/ML IV SOLN: 2.0000 mg | Freq: Once | INTRAVENOUS | Status: DC

## 2024-01-17 NOTE — Progress Notes (Signed)
 NAME:  [name], MRN:  [id number], DOB:  20-Jan-1956, LOS: 3 ADMISSION DATE:  01/14/2024, CONSULTATION DATE:  01/14/24 REFERRING MD:  Margaree Shark - EM, CHIEF COMPLAINT:  SOB // L pleural effusion   History of Present Illness:  68 yo M PMH PE currently on eliquis , former smoker, metastatic prostate cancer on enzalutamide  and sipuleucel-T , HLD, GERD, HTN who presented to [facility] ED 01/14/24 w CC SOB and dizziness. In ED had a CTA chest which reveals a loculated appearing left pleural effusion, enlarged mediastinal lymph nodes + consolidative L lung changes, no evidence of PE.   PCCM is called in this setting and has recommended admission to medicine with PCCM consultation for management of L pleural effusion and CT placement.  Pertinent Medical History:  PE on Eliquis  HX smoking Metastatic prostate cancer HTN HLD GERD   Significant Hospital Events: Including procedures, antibiotic start and stop dates in addition to other pertinent events   5/22 - [facility] ED w SOB. CTA Chest with loculated appearing left pleural effusion 5/23 - Transferred to [facility] for higher level of care. Eliquis  held, heparin  gtt started. 5/23 chest tube placed 5/24 intrapleural fibrinolytics x1  Interim History / Subjective:  Last night large volume chest output concerning for hemothorax, hypotensive, tachycardic, transferred to ICU. This morning feeling better than last night. With low back pain and chest wall site pain.   Objective:   Blood pressure (!) 76/55, pulse (!) 101, temperature (!) 97.5 F (36.4 C), temperature source Oral, resp. rate (!) 22, height 6\' 2"  (1.88 m), weight 109.5 kg, SpO2 96%.    Vent Mode: PCV;BIPAP FiO2 (%):  [35 %-50 %] 35 % Set Rate:  [20 bmp] 20 bmp PEEP:  [5 cmH20-8 cmH20] 8 cmH20 Pressure Support:  [8 cmH20] 8 cmH20   Intake/Output Summary (Last 24 hours) at 01/17/2024 0845 Last data filed at 01/17/2024 0600 Gross per 24 hour  Intake 3297.71 ml  Output 2685 ml  Net 612.71 ml   Filed  Weights   01/14/24 1133 01/14/24 2133 01/16/24 2045  Weight: 108.9 kg 108.8 kg 109.5 kg   Physical Examination: Overweight elderly man, chronically ill Pale Breathing non labored, on 3LNC, no wheeze Breath sounds diminished left lung base Puffy non pitting extremities Chest tube with serosanguinous drainage, clots in the tube   Labs and imaging personally reviewed Chest xray shows ongoing left sided pleural opacity, perhaps somewhat improved compared to yesterday.   Pleural fluid studies consistent with exudative process slight neutrophil predominance, glucose 135.  CBGs elevated  Resolved problem List:   Assessment and Plan:   Loculated L pleural effusion  S/p 1 dose of intrapleural fibrinolytics with resultant hemothorax Recent PE, on Eliquis  Prostate Cancer on active treatment Recent CAP  Suspect this is infection vs malignancy. No indication for steroids. Will stop as this is affecting CBGs.  Continue PPI, hold protonix .  Maintain chest tube to suction Pain control.  OOB Will follow output - seems to have slowed down. Plan on CT Chest as output slows. No further intrapleural lyrtics for now Await cytology Was treated a month ago with course of doxycycline , failed outpatient therapy.  - Supplemental O2 support for SpO2  > 90%, suspect this can be weaned down given current sats (charted 3LNC).  - Pulmonary hygiene - continue heparin  for now  - Continue broad-spectrum antibiotics (aztreonam, Flagyl , vanc; s/p doxy x 1 dose 5/22) given pcn allergy.   Potential out of ICU today for TRH to take back over tomorrow.  I spent 35 minutes in total visit time for this patient, with more than 50% spent counseling/coordinating care.   Louie Rover, MD Pulmonary and Critical Care Medicine [facility] 01/17/2024 8:45 AM Pager: see AMION  If no response to pager, please call critical care on call (see AMION) until 7pm After 7:00 pm call Elink      Labs    CBC: Recent Labs  Lab 01/14/24 1200 01/15/24 0842 01/16/24 0803 01/16/24 2102 01/17/24 0035  WBC 11.8* 11.2* 14.4* 18.8* 17.9*  HGB 9.8* 9.4* 9.4* 9.3* 9.0*  HCT 30.5* 28.7* 29.9* 29.7* 28.3*  MCV 98.4 95.7 98.0 95.5 95.6  PLT 400 402* 407* 474* 431*   Basic Metabolic Panel: Recent Labs  Lab 01/14/24 1200 01/14/24 1409 01/15/24 0842 01/16/24 0803 01/16/24 2102 01/17/24 0235  NA 129*  --  130* 130* 132* 132*  K 4.1  --  4.2 4.2 4.3 4.5  CL 96*  --  99 100 98 100  CO2 20*  --  21* 21* 19* 21*  GLUCOSE 134*  --  149* 174* 199* 226*  BUN 14  --  6* 10 13 16   CREATININE 0.90  --  0.69 0.73 1.07 1.16  CALCIUM  8.9  --  8.6* 8.7* 8.4* 8.5*  MG  --  2.1  --  2.0  --   --   PHOS  --   --   --  3.0  --   --    GFR: Estimated Creatinine Clearance: 80.3 mL/min (by C-G formula based on SCr of 1.16 mg/dL). Recent Labs  Lab 01/14/24 1423 01/15/24 0842 01/16/24 0803 01/16/24 2102 01/16/24 2250 01/17/24 0033 01/17/24 0035 01/17/24 0235  PROCALCITON  --   --   --   --  0.80  --   --   --   WBC  --  11.2* 14.4* 18.8*  --   --  17.9*  --   LATICACIDVEN 1.2  --   --  2.9*  --  1.8  --  2.6*   Liver Function Tests: Recent Labs  Lab 01/14/24 1200 01/15/24 1513  AST 28  --   ALT 20  --   ALKPHOS 82  --   BILITOT 0.3  --   PROT 7.5 6.2*  ALBUMIN 2.8*  --    No results for input(s): "LIPASE", "AMYLASE" in the last 168 hours. No results for input(s): "AMMONIA" in the last 168 hours.  ABG:    Component Value Date/Time   PHART 7.44 01/16/2024 2029   PCO2ART 28 (L) 01/16/2024 2029   PO2ART 89 01/16/2024 2029   HCO3 19.0 (L) 01/16/2024 2029   TCO2 24 11/26/2010 0919   ACIDBASEDEF 3.8 (H) 01/16/2024 2029   O2SAT 96.8 01/16/2024 2029

## 2024-01-17 NOTE — Plan of Care (Signed)

## 2024-01-17 NOTE — Progress Notes (Signed)
 PROGRESS NOTE    MD SMOLA  ZOX:[id number] DOB: 1955-12-09 DOA: 01/14/2024 PCP: [name], MD   Brief Narrative:  This 68 yrs old male with PMH significant for hypertension, hyperlipidemia, GERD, metastatic prostate cancer followed by Dr. [name], on enzalutamide  and sipuleucel-T , recent hospitalization where he was diagnosed with PE, started on Eliquis , presents to ED secondary to shortness of breath, fatigue, dizziness, lightheadedness, worsening dyspnea over the last month. In ED lactic acid at 2.0, white blood cell count at 11.8, CTA chest obtained, no evidence of PE, but significant for left lung base consolidation with significant loculated pleural effusion,.  Patient was admitted for further evaluation.  Pulmonology is consulted.   Assessment & Plan:   Principal Problem:   Loculated pleural effusion Active Problems:   Essential hypertension   Obesity (BMI 30-39.9)   COPD (chronic obstructive pulmonary disease) with chronic bronchitis (HCC)   GERD (gastroesophageal reflux disease)   Type 2 diabetes mellitus with obesity (HCC)   Prostate cancer metastatic to bone (HCC)   Pulmonary embolism (HCC)   Pleural effusion, left   History of pulmonary embolism   Loculated left pleural effusion: Status post intrapleural fibrinolytics with hemothorax: Sepsis due to HCAP: Patient presented with sepsis criteria (elevated lactic acid, leukocytosis, tachypnea and tachycardia) There is high concern for aspiration, his wife reports significant coughing / choking when eating.  Continue on dysphagia diet, Speech and swallow evaluation. Initiated on empiric antibiotics ( Vancomycin , Azactam and Flagyl  ) Encourage use of  incentive spirometry and flutter valve. Pulmonology consulted , Patient underwent successful chest tube insertion. Maintain chest tube to suction,  awaiting cytology. Continue heparin .  Continue supplemental oxygen. Status post intrapleural fibrinolytic therapy,   Patient has developed high output in the chest tube concerning for hemothorax.  Patient became hypotensive,  tachycardic and was transferred to ICU. Now the chest tube output has reduced no further into pleural fibrinolytics for now.  Pulmonary embolism: Diagnosed with PE and started on Eliquis  upon recent hospitalization.   Has been compliant with this medication. Patient placed on heparin  as he might have required chest tube insertion.   Prolonged QT interval: Hold  QT prolonging agents for now. Hold Abilify , Plaquenil, Effexor and desipramine   Essential hypertension: Resume home meds   Mixed hyperlipidemia: Continue Crestor , fenofibrate .   GERD: Continue Protonix .   Metastatic prostate cancer: Patient follows with Dr. [name], [name] For now we will hold his Xtandi  due to acute infection   Rheumatoid  arthritis: -hold Plaquenil due to prolonged QTc   DVT prophylaxis:Heparin  sq Code Status: Full code Family Communication:No family at bed side. Disposition Plan:   Status is: Inpatient Remains inpatient appropriate because: Severity of illness.   Consultants:  Pulmonology  Procedures: None  Antimicrobials:  Anti-infectives (From admission, onward)    Start     Dose/Rate Route Frequency Ordered Stop   01/15/24 1000  vancomycin  (VANCOCIN ) IVPB 1000 mg/200 mL premix        1,000 mg 200 mL/hr over 60 Minutes Intravenous Every 12 hours 01/14/24 2030     01/15/24 0000  aztreonam (AZACTAM) 2 g in sodium chloride  0.9 % 100 mL IVPB        2 g 200 mL/hr over 30 Minutes Intravenous Every 8 hours 01/14/24 2030     01/14/24 2100  metroNIDAZOLE  (FLAGYL ) IVPB 500 mg        500 mg 100 mL/hr over 60 Minutes Intravenous Every 12 hours 01/14/24 2001     01/14/24 2100  vancomycin  (  VANCOCIN ) IVPB 1000 mg/200 mL premix        1,000 mg 200 mL/hr over 60 Minutes Intravenous  Once 01/14/24 2030 01/15/24 0028   01/14/24 1730  vancomycin  (VANCOCIN ) IVPB 1000 mg/200 mL premix         1,000 mg 200 mL/hr over 60 Minutes Intravenous  Once 01/14/24 1715 01/14/24 1937   01/14/24 1600  doxycycline  (VIBRAMYCIN ) 100 mg in sodium chloride  0.9 % 250 mL IVPB        100 mg 125 mL/hr over 120 Minutes Intravenous  Once 01/14/24 1518 01/14/24 1939   01/14/24 1530  aztreonam (AZACTAM) 2 g in sodium chloride  0.9 % 100 mL IVPB        2 g 200 mL/hr over 30 Minutes Intravenous  Once 01/14/24 1519 01/14/24 1654      Subjective: Patient was seen and examined at bedside.  Overnight events noted. Patient was sitting comfortably on the bed,  states last night he was moved to ICU because he has significantly high output in the chest tube which was concerning for hemothorax. States he is feeling better Now, blood pressure has improved.  Objective: Vitals:   01/17/24 0915 01/17/24 0930 01/17/24 0945 01/17/24 1000  BP: (!) 86/74 95/68 (!) 78/69 98/66  Pulse: (!) 104 (!) 104 (!) 106 (!) 104  Resp: (!) 28 (!) 35 (!) 35 (!) 24  Temp:      TempSrc:      SpO2: 97% 94% 97% 95%  Weight:      Height:        Intake/Output Summary (Last 24 hours) at 01/17/2024 1008 Last data filed at 01/17/2024 0600 Gross per 24 hour  Intake 3297.71 ml  Output 1960 ml  Net 1337.71 ml   Filed Weights   01/14/24 1133 01/14/24 2133 01/16/24 2045  Weight: 108.9 kg 108.8 kg 109.5 kg    Examination:  General exam: Appears calm and comfortable, not in any acute distress. Respiratory system: CTA bilaterally. Respiratory effort normal.  RR 15, Left sided chest tube. Cardiovascular system: S1 & S2 heard, RRR. No JVD, murmurs, rubs, gallops or clicks. Gastrointestinal system: Abdomen is non distended, soft and non tender.  Normal bowel sounds heard. Central nervous system: Alert and oriented x 3. No focal neurological deficits. Extremities: No edema, no cyanosis, no clubbing. Skin: No rashes, lesions or ulcers Psychiatry: Judgement and insight appear normal. Mood & affect appropriate.     Data Reviewed: I have  personally reviewed following labs and imaging studies  CBC: Recent Labs  Lab 01/14/24 1200 01/15/24 0842 01/16/24 0803 01/16/24 2102 01/17/24 0035  WBC 11.8* 11.2* 14.4* 18.8* 17.9*  HGB 9.8* 9.4* 9.4* 9.3* 9.0*  HCT 30.5* 28.7* 29.9* 29.7* 28.3*  MCV 98.4 95.7 98.0 95.5 95.6  PLT 400 402* 407* 474* 431*   Basic Metabolic Panel: Recent Labs  Lab 01/14/24 1200 01/14/24 1409 01/15/24 0842 01/16/24 0803 01/16/24 2102 01/17/24 0235  NA 129*  --  130* 130* 132* 132*  K 4.1  --  4.2 4.2 4.3 4.5  CL 96*  --  99 100 98 100  CO2 20*  --  21* 21* 19* 21*  GLUCOSE 134*  --  149* 174* 199* 226*  BUN 14  --  6* 10 13 16   CREATININE 0.90  --  0.69 0.73 1.07 1.16  CALCIUM  8.9  --  8.6* 8.7* 8.4* 8.5*  MG  --  2.1  --  2.0  --   --   PHOS  --   --   --  3.0  --   --    GFR: Estimated Creatinine Clearance: 80.3 mL/min (by C-G formula based on SCr of 1.16 mg/dL). Liver Function Tests: Recent Labs  Lab 01/14/24 1200 01/15/24 1513  AST 28  --   ALT 20  --   ALKPHOS 82  --   BILITOT 0.3  --   PROT 7.5 6.2*  ALBUMIN 2.8*  --    No results for input(s): "LIPASE", "AMYLASE" in the last 168 hours. No results for input(s): "AMMONIA" in the last 168 hours. Coagulation Profile: No results for input(s): "INR", "PROTIME" in the last 168 hours. Cardiac Enzymes: No results for input(s): "CKTOTAL", "CKMB", "CKMBINDEX", "TROPONINI" in the last 168 hours. BNP (last 3 results) No results for input(s): "PROBNP" in the last 8760 hours. HbA1C: Recent Labs    01/17/24 0033  HGBA1C 6.1*   CBG: Recent Labs  Lab 01/16/24 2044 01/16/24 2330 01/17/24 0317 01/17/24 0725  GLUCAP 182* 200* 194* 206*   Lipid Profile: No results for input(s): "CHOL", "HDL", "LDLCALC", "TRIG", "CHOLHDL", "LDLDIRECT" in the last 72 hours. Thyroid  Function Tests: No results for input(s): "TSH", "T4TOTAL", "FREET4", "T3FREE", "THYROIDAB" in the last 72 hours. Anemia Panel: No results for input(s):  "VITAMINB12", "FOLATE", "FERRITIN", "TIBC", "IRON", "RETICCTPCT" in the last 72 hours. Sepsis Labs: Recent Labs  Lab 01/14/24 1423 01/16/24 2102 01/16/24 2250 01/17/24 0033 01/17/24 0235  PROCALCITON  --   --  0.80  --   --   LATICACIDVEN 1.2 2.9*  --  1.8 2.6*    Recent Results (from the past 240 hours)  Resp panel by RT-PCR (RSV, Flu A&B, Covid) Anterior Nasal Swab     Status: None   Collection Time: 01/14/24 12:09 PM   Specimen: Anterior Nasal Swab  Result Value Ref Range Status   SARS Coronavirus 2 by RT PCR NEGATIVE NEGATIVE Final    Comment: (NOTE) SARS-CoV-2 target nucleic acids are NOT DETECTED.  The SARS-CoV-2 RNA is generally detectable in upper respiratory specimens during the acute phase of infection. The lowest concentration of SARS-CoV-2 viral copies this assay can detect is 138 copies/mL. A negative result does not preclude SARS-Cov-2 infection and should not be used as the sole basis for treatment or other patient management decisions. A negative result may occur with  improper specimen collection/handling, submission of specimen other than nasopharyngeal swab, presence of viral mutation(s) within the areas targeted by this assay, and inadequate number of viral copies(<138 copies/mL). A negative result must be combined with clinical observations, patient history, and epidemiological information. The expected result is Negative.  Fact Sheet for Patients:  BloggerCourse.com  Fact Sheet for Healthcare Providers:  SeriousBroker.it  This test is no t yet approved or cleared by the [place]  FDA and  has been authorized for detection and/or diagnosis of SARS-CoV-2 by FDA under an Emergency Use Authorization (EUA). This EUA will remain  in effect (meaning this test can be used) for the duration of the COVID-19 declaration under Section 564(b)(1) of the Act, 21 U.S.C.section 360bbb-3(b)(1), unless the  authorization is terminated  or revoked sooner.       Influenza A by PCR NEGATIVE NEGATIVE Final   Influenza B by PCR NEGATIVE NEGATIVE Final    Comment: (NOTE) The Xpert Xpress SARS-CoV-2/FLU/RSV plus assay is intended as an aid in the diagnosis of influenza from Nasopharyngeal swab specimens and should not be used as a sole basis for treatment. Nasal washings and aspirates are unacceptable for Xpert Xpress SARS-CoV-2/FLU/RSV testing.  Fact Sheet for  Patients: BloggerCourse.com  Fact Sheet for Healthcare Providers: SeriousBroker.it  This test is not yet approved or cleared by the [place]  FDA and has been authorized for detection and/or diagnosis of SARS-CoV-2 by FDA under an Emergency Use Authorization (EUA). This EUA will remain in effect (meaning this test can be used) for the duration of the COVID-19 declaration under Section 564(b)(1) of the Act, 21 U.S.C. section 360bbb-3(b)(1), unless the authorization is terminated or revoked.     Resp Syncytial Virus by PCR NEGATIVE NEGATIVE Final    Comment: (NOTE) Fact Sheet for Patients: BloggerCourse.com  Fact Sheet for Healthcare Providers: SeriousBroker.it  This test is not yet approved or cleared by the [place]  FDA and has been authorized for detection and/or diagnosis of SARS-CoV-2 by FDA under an Emergency Use Authorization (EUA). This EUA will remain in effect (meaning this test can be used) for the duration of the COVID-19 declaration under Section 564(b)(1) of the Act, 21 U.S.C. section 360bbb-3(b)(1), unless the authorization is terminated or revoked.  Performed at [facility], [address], [place], [place] [address]   Culture, blood (routine x 2)     Status: None (Preliminary result)   Collection Time: 01/14/24  5:00 PM   Specimen: BLOOD  Result Value Ref Range Status   Specimen Description BLOOD  BLOOD RIGHT HAND  Final   Special Requests   Final    BOTTLES DRAWN AEROBIC AND ANAEROBIC Blood Culture adequate volume   Culture   Final    NO GROWTH 3 DAYS Performed at [facility], [address], [place], [place] [address]    Report Status PENDING  Incomplete  Culture, blood (routine x 2)     Status: None (Preliminary result)   Collection Time: 01/14/24  5:47 PM   Specimen: BLOOD  Result Value Ref Range Status   Specimen Description BLOOD RIGHT ANTECUBITAL  Final   Special Requests   Final    BOTTLES DRAWN AEROBIC ONLY Blood Culture adequate volume   Culture   Final    NO GROWTH 3 DAYS Performed at [facility], [address], [place], [place] [address]    Report Status PENDING  Incomplete  MRSA Next Gen by PCR, Nasal     Status: None   Collection Time: 01/15/24 12:12 AM   Specimen: Urine, Clean Catch; Nasal Swab  Result Value Ref Range Status   MRSA by PCR Next Gen NOT DETECTED NOT DETECTED Final    Comment: (NOTE) The GeneXpert MRSA Assay (FDA approved for NASAL specimens only), is one component of a comprehensive MRSA colonization surveillance program. It is not intended to diagnose MRSA infection nor to guide or monitor treatment for MRSA infections. Test performance is not FDA approved in patients less than 45 years old. Performed at [facility], [address] [place], [place] [address]   Body fluid culture w Gram Stain     Status: None (Preliminary result)   Collection Time: 01/15/24  2:06 PM   Specimen: Pleural Fluid  Result Value Ref Range Status   Specimen Description PLEURAL  Final   Special Requests left  Final   Gram Stain NO WBC SEEN NO ORGANISMS SEEN   Final   Culture   Final    NO GROWTH 2 DAYS Performed at [facility], [address], [place], [place] [address]    Report Status PENDING  Incomplete  Culture, blood (Routine X 2) w Reflex to ID Panel     Status: None (Preliminary  result)   Collection Time: 01/16/24 10:50 PM    Specimen: BLOOD RIGHT ARM  Result Value Ref Range Status   Specimen Description BLOOD RIGHT ARM  Final   Special Requests   Final    BOTTLES DRAWN AEROBIC AND ANAEROBIC Blood Culture adequate volume   Culture   Final    NO GROWTH < 12 HOURS Performed at [facility], [address], [place], [place] [address]    Report Status PENDING  Incomplete  Culture, blood (Routine X 2) w Reflex to ID Panel     Status: None (Preliminary result)   Collection Time: 01/16/24 10:50 PM   Specimen: BLOOD LEFT HAND  Result Value Ref Range Status   Specimen Description BLOOD LEFT HAND  Final   Special Requests   Final    BOTTLES DRAWN AEROBIC AND ANAEROBIC Blood Culture results may not be optimal due to an inadequate volume of blood received in culture bottles   Culture   Final    NO GROWTH < 12 HOURS Performed at [facility], [address], [place], [place] [address]    Report Status PENDING  Incomplete    Radiology Studies: DG Chest Port 1 View Result Date: 01/17/2024 CLINICAL DATA:  Acute respiratory failure. EXAM: PORTABLE CHEST 1 VIEW COMPARISON:  01/16/2024 FINDINGS: There is a left-sided pigtail drainage catheter overlying the left lower lung. The left-sided pleural fluid collection is decreased from previous exam. Chronic, bilateral reticular interstitial opacities with a lower lung zone predominance. No significant pneumothorax identified. IMPRESSION: 1. Left-sided pigtail drainage catheter overlying the left lower lung. Decreased left pleural fluid collection. No pneumothorax. 2. Chronic interstitial changes as noted previously. Electronically Signed   By: Kimberley Penman M.D.   On: 01/17/2024 08:49   DG Chest Port 1 View Result Date: 01/16/2024 CLINICAL DATA:  Acute respiratory distress EXAM: PORTABLE CHEST 1 VIEW COMPARISON:  X-ray earlier 01/16/2024 and older. FINDINGS: Film is rotated to left. Left basilar pigtail catheter is again seen and has slightly retracted compared to the  previous x-ray. Persistent left-sided effusion adjacent lung opacity. Masslike fullness of the left lung hilum again seen. Please correlate with prior CTA of the chest. Stable cardiopericardial silhouette. Underinflation with some right basilar opacity again seen. No pneumothorax. Overlapping cardiac leads. Fixation hardware along the lower cervical spine. IMPRESSION: Interval retraction of the pigtail catheter at the left lung base. No pneumothorax. No significant oval change when adjusted for technique otherwise. Electronically Signed   By: Adrianna Horde M.D.   On: 01/16/2024 19:55   DG Chest Port 1 View Result Date: 01/16/2024 CLINICAL DATA:  Chest tube EXAM: PORTABLE CHEST 1 VIEW COMPARISON:  Chest x-ray performed Jan 15, 2024 FINDINGS: A left basilar thoracostomy is present. Persistent opacification of the left lung base. Mild diffuse interstitial prominence. Heart mediastinum are not significantly changed. No pneumothorax. IMPRESSION: 1. Left-sided thoracostomy tube without evidence of pneumothorax. 2. When compared to the prior exam, similar appearance of left basilar consolidation and pleural effusion. 3. No significant interval change. Electronically Signed   By: Reagan Camera M.D.   On: 01/16/2024 08:39   DG CHEST PORT 1 VIEW Result Date: 01/15/2024 CLINICAL DATA:  Left chest tube EXAM: PORTABLE CHEST 1 VIEW COMPARISON:  01/14/2024 FINDINGS: Single frontal view of the chest demonstrates a stable cardiac silhouette. Interval placement of a left chest tube, overlying the medial left lung base. Marked decrease in the multilocular left pleural effusion seen previously. Persistent areas of consolidation are seen  within the left perihilar region, most pronounced in the left upper lobe. Stable emphysema. No pneumothorax. No acute bony abnormalities. IMPRESSION: 1. Marked decrease in the multilocular left pleural effusion after left chest tube placement. 2. Persistent left perihilar consolidation, greatest in  the left upper lobe, consistent with pneumonia or atelectasis. Electronically Signed   By: Bobbye Burrow M.D.   On: 01/15/2024 16:18   Scheduled Meds:  Chlorhexidine  Gluconate Cloth  6 each Topical q morning   feeding supplement  237 mL Oral BID BM   ferrous sulfate  325 mg Oral Daily   furosemide  20 mg Oral Daily   insulin aspart  0-15 Units Subcutaneous Q4H   mouth rinse  15 mL Mouth Rinse 4 times per day   pantoprazole   40 mg Oral QAC breakfast   rosuvastatin   40 mg Oral Daily   sodium chloride  flush  10 mL Intrapleural Q8H   Continuous Infusions:  aztreonam Stopped (01/17/24 0001)   heparin  2,400 Units/hr (01/17/24 0600)   metronidazole  500 mg (01/17/24 0858)   vancomycin  Stopped (01/16/24 2309)     LOS: 3 days    Time spent: 50 mins    Magdalene School, MD [facility]   If 7PM-7AM, please contact night-coverage

## 2024-01-17 NOTE — Progress Notes (Signed)
 Pharmacy Antibiotic Note  [name] is a 68 y.o. male  w/ PMH of HTN, HLD, GERD, metastatic prostate cancer, h/o PE (started on Eliquis ) admitted on 01/14/2024 with pneumonia.    Pt has been on D4 of abx for PNA. Vanc peak came back 29/Trough 17>>AUC 523.  Scr 1.16  Plan:  1) Continue vanc 1000 mg every 12 hours    Height: 6\' 2"  (188 cm) Weight: 109.5 kg (241 lb 6.5 oz) IBW/kg (Calculated) : 82.2  Temp (24hrs), Avg:98.2 F (36.8 C), Min:97.5 F (36.4 C), Max:99.7 F (37.6 C)  Recent Labs  Lab 01/14/24 1200 01/14/24 1209 01/14/24 1423 01/15/24 0842 01/16/24 0803 01/16/24 2102 01/17/24 0033 01/17/24 0035 01/17/24 0235 01/17/24 0955 01/17/24 1457 01/17/24 2051  WBC 11.8*  --   --  11.2* 14.4* 18.8*  --  17.9*  --   --   --   --   CREATININE 0.90  --   --  0.69 0.73 1.07  --   --  1.16  --   --   --   LATICACIDVEN  --    < > 1.2  --   --  2.9* 1.8  --  2.6* 3.1*  --   --   VANCOTROUGH  --   --   --   --   --   --   --   --   --   --   --  17  VANCOPEAK  --   --   --   --   --   --   --   --   --   --  29*  --    < > = values in this interval not displayed.    Estimated Creatinine Clearance: 80.3 mL/min (by C-G formula based on SCr of 1.16 mg/dL).    Allergies  Allergen Reactions   Penicillins Anaphylaxis         Antimicrobials this admission: Aztreonam 5/22>> Doxy IV x 1 on 5/22 Flagyl  IV 5/22>> Vanc 5/22 >>  Microbiology results: 5/23 pleural fluid ng < 24hrs 5/22 blood: ngtd2 5/23 MRSA PCR: neg 5/22 COVID, flu and RSV: neg  Ivery Marking, PharmD, BCIDP, AAHIVP, CPP Infectious Disease Pharmacist 01/17/2024 10:12 PM

## 2024-01-17 NOTE — Progress Notes (Signed)
 No answer on 4E when trying to call report. Will try again.

## 2024-01-17 NOTE — Progress Notes (Signed)
   01/17/24 2231  Vitals  Temp 98 F (36.7 C)  Temp Source Oral  BP 91/65  MAP (mmHg) 74  BP Location Right Arm  BP Method Automatic  Patient Position (if appropriate) Lying  Pulse Rate 98  Pulse Rate Source Monitor  ECG Heart Rate 100  Resp (!) 31  Level of Consciousness  Level of Consciousness Alert  MEWS COLOR  MEWS Score Color Yellow  Oxygen Therapy  SpO2 100 %  O2 Device Nasal Cannula  O2 Flow Rate (L/min) 3 L/min  MEWS Score  MEWS Temp 0  MEWS Systolic 1  MEWS Pulse 0  MEWS RR 2  MEWS LOC 0  MEWS Score 3   Pt transferred from [facility] to [id number]. Pt alert and oriented. Pt denies pain. Pt chest tube placed on suction per order. CCMD called. Pt made comfortable, call light by pt. Pt oriented to unit.

## 2024-01-17 NOTE — Progress Notes (Addendum)
 PHARMACY - ANTICOAGULATION CONSULT NOTE  Pharmacy Consult for Heparin  (Eliquis  on hold) Indication: hx pulmonary embolus 12/01/2023  Allergies  Allergen Reactions   Penicillins Anaphylaxis         Patient Measurements: Height: 6\' 2"  (188 cm) Weight: 109.5 kg (241 lb 6.5 oz) IBW/kg (Calculated) : 82.2 HEPARIN  DW (KG): 104.6  Vital Signs: Temp: 99.7 F (37.6 C) (05/24 2334) Temp Source: Axillary (05/24 2334) BP: 84/63 (05/25 0130) Pulse Rate: 112 (05/25 0130)  Labs: Recent Labs    01/14/24 1200 01/15/24 0842 01/16/24 0047 01/16/24 0803 01/16/24 1837 01/16/24 2102 01/16/24 2250 01/17/24 0033 01/17/24 0035  HGB 9.8* 9.4*  --  9.4*  --  9.3*  --   --  9.0*  HCT 30.5* 28.7*  --  29.9*  --  29.7*  --   --  28.3*  PLT 400 402*  --  407*  --  474*  --   --  431*  APTT  --  53*   < > 60* 43*  --   --  174*  --   HEPARINUNFRC  --  0.59  --  0.39  --   --   --  0.79*  --   CREATININE 0.90 0.69  --  0.73  --  1.07  --   --   --   TROPONINIHS 5  --   --   --   --  26* 26*  --   --    < > = values in this interval not displayed.    Estimated Creatinine Clearance: 87 mL/min (by C-G formula based on SCr of 1.07 mg/dL).  Assessment: 68 y.o. male w/ PMH of HTN, HLD, GERD, metastatic prostate cancer, h/o PE 12/01/23 (started on Eliquis ), presenting with loculated pleural effusion. Last dose of Eliquis  01/14/24 at 0800. Transitioned to heparin  drip in anticipation of procedure for loculated pleural effusion. Will use aPTTs for monitoring while heparin  levels are expected to be falsely elevated due to recent Eliquis  doses.  AM: aPTT supratherapeutic (174) and heparin  level now slightly elevated at 0.79. CBC stable. No issues with line or heparin  per RN.   Goal of Therapy:  Heparin  level 0.3-0.7 units/ml aPTT 66-102 seconds Monitor platelets by anticoagulation protocol: Yes   Plan:  Decrease heparin  gtt to 2400 units/hr F/u aPTT and heparin  level in 6hrs to make sure still  correlated Daily aPTT and heparin  level until correlating; daily CBC. Monitor for signs and symptoms of bleeding. F/u plans to resume PTA Eliquis   Young Hensen, PharmD, BCPS Clinical Pharmacist 01/17/2024 2:07 AM

## 2024-01-17 NOTE — Progress Notes (Signed)
 Dr. Rito Chess (elink) made aware of patients chest tube drainage having an output of 450 ml's in the last hour with maybe some small clots? The drainage is still a dark red color. Patients vitals are stable with no evidence of respiratory distress. Patient is resting comfortable in bed with call bell within reach.

## 2024-01-17 NOTE — Progress Notes (Signed)
 Patient transferred to [id number] from 4E with rapid response nurse, Marco Severs, RN. Patient on 80% bipap, tachypnea, and tachycardia. O2 sats are 98%. Patient is on a heparin  gtt @26  going through a LAC PIV. Patients belongings consist of personal clothes, a cell phone, eyeglasses, and grey shoes. Skin assessment done with Velinda Getting, RN and no issue noted. Patient has a Left chest tube at -20 suction. No air leak noted, with dark red drainage and clots noticed in system and tubing. Call bell given to patient and bed is locked and in lowest position. Will continue to follow current care plan.

## 2024-01-17 NOTE — Progress Notes (Signed)
 RT NOTE: PT has bipap PRN order. PT in no distress with sats of 100%. Bipap not indicated at this time.

## 2024-01-17 NOTE — Progress Notes (Signed)
 PHARMACY - ANTICOAGULATION CONSULT NOTE  Pharmacy Consult for Heparin  (Eliquis  on hold) Indication: hx pulmonary embolus 12/01/2023  Allergies  Allergen Reactions   Penicillins Anaphylaxis         Patient Measurements: Height: 6\' 2"  (188 cm) Weight: 109.5 kg (241 lb 6.5 oz) IBW/kg (Calculated) : 82.2 HEPARIN  DW (KG): 104.6  Vital Signs: Temp: 98 F (36.7 C) (05/25 2231) Temp Source: Oral (05/25 2231) BP: 91/65 (05/25 2231) Pulse Rate: 98 (05/25 2231)  Labs: Recent Labs    01/16/24 0803 01/16/24 1837 01/16/24 2102 01/16/24 2250 01/17/24 0033 01/17/24 0035 01/17/24 0235 01/17/24 0955 01/17/24 2051  HGB 9.4*  --  9.3*  --   --  9.0*  --   --   --   HCT 29.9*  --  29.7*  --   --  28.3*  --   --   --   PLT 407*  --  474*  --   --  431*  --   --   --   APTT 60*   < >  --   --  174*  --   --  >200* 116*  HEPARINUNFRC 0.39  --   --   --  0.79*  --   --  1.08* 0.58  CREATININE 0.73  --  1.07  --   --   --  1.16  --   --   TROPONINIHS  --   --  26* 26*  --   --   --  15  --    < > = values in this interval not displayed.    Estimated Creatinine Clearance: 80.3 mL/min (by C-G formula based on SCr of 1.16 mg/dL).  Assessment: 68 y.o. male w/ PMH of HTN, HLD, GERD, metastatic prostate cancer, h/o PE 12/01/23 (started on Eliquis ), presenting with loculated pleural effusion. Last dose of Eliquis  01/14/24 at 0800. Transitioned to heparin  drip in anticipation of procedure for loculated pleural effusion. Will use aPTTs for monitoring while heparin  levels are expected to be falsely elevated due to recent Eliquis  doses.  aPTT still supratherapeutic but now at 116 on 2000 units/hr, but heparin  level therapeutic at 0.58. No issues with line or heparin  per RN. Chest tube output has been stable, serosanguineous    Goal of Therapy:  Heparin  level 0.3-0.7 units/ml Monitor platelets by anticoagulation protocol: Yes   Plan:  Continue heparin  gtt at 2000 units/hr F/u heparin  level with AM  labs Daily heparin  level and CBC. Monitor for signs and symptoms of bleeding. F/u plans to resume PTA Eliquis   Young Hensen, PharmD, BCPS Clinical Pharmacist 01/18/2024 12:32 AM

## 2024-01-17 NOTE — Progress Notes (Signed)
 Will call nurse, Myra of 4E back in 10 minutes for report d/t med pass.

## 2024-01-17 NOTE — Progress Notes (Signed)
 Report given to 4 E nurse, Myra. Patient transferred with his personal clothing and shoes, cell phone, and eyeglasses. Two PIV's C/D/I with the LAC with heparin  @20  mL's/hr. Left CT to water  seal during transfer, then to -20 Sx. No air leak noted. Patient is A&O X 4. Wife is aware of transfer to receiving unit. Chart transferred with patient.

## 2024-01-17 NOTE — Progress Notes (Signed)
 PHARMACY - ANTICOAGULATION CONSULT NOTE  Pharmacy Consult for Heparin  (Eliquis  on hold) Indication: hx pulmonary embolus 12/01/2023  Allergies  Allergen Reactions   Penicillins Anaphylaxis         Patient Measurements: Height: 6\' 2"  (188 cm) Weight: 109.5 kg (241 lb 6.5 oz) IBW/kg (Calculated) : 82.2 HEPARIN  DW (KG): 104.6  Vital Signs: Temp: 98.6 F (37 C) (05/25 1206) Temp Source: Axillary (05/25 1206) BP: 98/66 (05/25 1000) Pulse Rate: 104 (05/25 1000)  Labs: Recent Labs    01/16/24 0803 01/16/24 1837 01/16/24 2102 01/16/24 2250 01/17/24 0033 01/17/24 0035 01/17/24 0235 01/17/24 0955  HGB 9.4*  --  9.3*  --   --  9.0*  --   --   HCT 29.9*  --  29.7*  --   --  28.3*  --   --   PLT 407*  --  474*  --   --  431*  --   --   APTT 60* 43*  --   --  174*  --   --  >200*  HEPARINUNFRC 0.39  --   --   --  0.79*  --   --  1.08*  CREATININE 0.73  --  1.07  --   --   --  1.16  --   TROPONINIHS  --   --  26* 26*  --   --   --  15    Estimated Creatinine Clearance: 80.3 mL/min (by C-G formula based on SCr of 1.16 mg/dL).  Assessment: 68 y.o. male w/ PMH of HTN, HLD, GERD, metastatic prostate cancer, h/o PE 12/01/23 (started on Eliquis ), presenting with loculated pleural effusion. Last dose of Eliquis  01/14/24 at 0800. Transitioned to heparin  drip in anticipation of procedure for loculated pleural effusion. Will use aPTTs for monitoring while heparin  levels are expected to be falsely elevated due to recent Eliquis  doses.  aPTT supratherapeutic (>200) and heparin  level elevated at 1.08. CBC stable. No issues with line or heparin  per RN.   Chest tube with 1.91 L output in last 24 hours.   Goal of Therapy:  Heparin  level 0.3-0.7 units/ml aPTT 66-102 seconds Monitor platelets by anticoagulation protocol: Yes   Plan:  Hold heparin  for 1 hour  Decrease heparin  gtt to 2000 units/hr F/u aPTT in 6hrs to make sure still correlated Daily aPTT and heparin  level until correlating;  daily CBC. Monitor for signs and symptoms of bleeding. F/u plans to resume PTA Eliquis   Patience Bonito, PharmD, BCPS, BCCCP Clinical Pharmacist

## 2024-01-18 ENCOUNTER — Telehealth: Payer: Self-pay | Admitting: Internal Medicine

## 2024-01-18 DIAGNOSIS — C61 Malignant neoplasm of prostate: Secondary | ICD-10-CM | POA: Diagnosis not present

## 2024-01-18 DIAGNOSIS — J90 Pleural effusion, not elsewhere classified: Secondary | ICD-10-CM | POA: Diagnosis not present

## 2024-01-18 LAB — CBC
HCT: 24.9 % — ABNORMAL LOW (ref 39.0–52.0)
Hemoglobin: 7.7 g/dL — ABNORMAL LOW (ref 13.0–17.0)
MCH: 30.8 pg (ref 26.0–34.0)
MCHC: 30.9 g/dL (ref 30.0–36.0)
MCV: 99.6 fL (ref 80.0–100.0)
Platelets: 429 10*3/uL — ABNORMAL HIGH (ref 150–400)
RBC: 2.5 MIL/uL — ABNORMAL LOW (ref 4.22–5.81)
RDW: 14.3 % (ref 11.5–15.5)
WBC: 16 10*3/uL — ABNORMAL HIGH (ref 4.0–10.5)
nRBC: 0.1 % (ref 0.0–0.2)

## 2024-01-18 LAB — BASIC METABOLIC PANEL WITH GFR
Anion gap: 9 (ref 5–15)
BUN: 27 mg/dL — ABNORMAL HIGH (ref 8–23)
CO2: 22 mmol/L (ref 22–32)
Calcium: 8.1 mg/dL — ABNORMAL LOW (ref 8.9–10.3)
Chloride: 98 mmol/L (ref 98–111)
Creatinine, Ser: 0.99 mg/dL (ref 0.61–1.24)
GFR, Estimated: >60 mL/min (ref >60)
Glucose, Bld: 139 mg/dL — ABNORMAL HIGH (ref 70–99)
Potassium: 4.4 mmol/L (ref 3.5–5.1)
Sodium: 129 mmol/L — ABNORMAL LOW (ref 135–145)

## 2024-01-18 LAB — BODY FLUID CULTURE W GRAM STAIN
Culture: NO GROWTH
Gram Stain: NONE SEEN

## 2024-01-18 LAB — GLUCOSE, CAPILLARY
Glucose-Capillary: 122 mg/dL — ABNORMAL HIGH (ref 70–99)
Glucose-Capillary: 131 mg/dL — ABNORMAL HIGH (ref 70–99)
Glucose-Capillary: 138 mg/dL — ABNORMAL HIGH (ref 70–99)
Glucose-Capillary: 148 mg/dL — ABNORMAL HIGH (ref 70–99)
Glucose-Capillary: 153 mg/dL — ABNORMAL HIGH (ref 70–99)
Glucose-Capillary: 157 mg/dL — ABNORMAL HIGH (ref 70–99)

## 2024-01-18 LAB — PHOSPHORUS: Phosphorus: 2.4 mg/dL — ABNORMAL LOW (ref 2.5–4.6)

## 2024-01-18 LAB — HEPARIN LEVEL (UNFRACTIONATED)
Heparin Unfractionated: 0.58 [IU]/mL (ref 0.30–0.70)
Heparin Unfractionated: 0.65 [IU]/mL (ref 0.30–0.70)

## 2024-01-18 LAB — MAGNESIUM: Magnesium: 2.1 mg/dL (ref 1.7–2.4)

## 2024-01-18 MED ORDER — LEVOFLOXACIN 750 MG PO TABS
750.0000 mg | ORAL_TABLET | Freq: Every day | ORAL | Status: AC
  Administered 2024-01-18: 750 mg via ORAL
  Filled 2024-01-18 (×2): qty 1

## 2024-01-18 NOTE — Plan of Care (Signed)

## 2024-01-18 NOTE — Progress Notes (Signed)
 PHARMACY - ANTICOAGULATION CONSULT NOTE  Pharmacy Consult for Heparin  (Eliquis  on hold) Indication: hx pulmonary embolus 12/01/2023  Allergies  Allergen Reactions   Penicillins Anaphylaxis         Patient Measurements: Height: 6\' 2"  (188 cm) Weight: 109.5 kg (241 lb 6.5 oz) IBW/kg (Calculated) : 82.2 HEPARIN  DW (KG): 104.6  Vital Signs: Temp: 97.7 F (36.5 C) (05/26 0340) Temp Source: Oral (05/26 0340) BP: 89/68 (05/26 0340) Pulse Rate: 102 (05/26 0340)  Labs: Recent Labs    01/16/24 0803 01/16/24 1837 01/16/24 2102 01/16/24 2250 01/17/24 0033 01/17/24 0035 01/17/24 0235 01/17/24 0955 01/17/24 2051  HGB 9.4*  --  9.3*  --   --  9.0*  --   --   --   HCT 29.9*  --  29.7*  --   --  28.3*  --   --   --   PLT 407*  --  474*  --   --  431*  --   --   --   APTT 60*   < >  --   --  174*  --   --  >200* 116*  HEPARINUNFRC 0.39  --   --   --  0.79*  --   --  1.08* 0.58  CREATININE 0.73  --  1.07  --   --   --  1.16  --   --   TROPONINIHS  --   --  26* 26*  --   --   --  15  --    < > = values in this interval not displayed.    Estimated Creatinine Clearance: 80.3 mL/min (by C-G formula based on SCr of 1.16 mg/dL).  Assessment: 68 y.o. male w/ PMH of HTN, HLD, GERD, metastatic prostate cancer, h/o PE 12/01/23 (started on Eliquis ), presenting with loculated pleural effusion. Last dose of Eliquis  01/14/24 at 0800. Transitioned to heparin  drip in anticipation of procedure for loculated pleural effusion.  Heparin  level therapeutic (0.65) on 2000 units/hr. No issues with heparin  infusion per RN. Noted decrease in Hgb from 9 to 7.7. No s/sx of bleeding and chest tube with no heavy drainage per RN. Plans to remove the tube soon.  Goal of Therapy:  Heparin  level 0.3-0.7 units/ml Monitor platelets by anticoagulation protocol: Yes   Plan:  Continue heparin  gtt at 2000 units/hr Daily heparin  level and CBC Monitor for signs and symptoms of bleeding. F/u plans to resume PTA  Eliquis   Abelina Abide, PharmD PGY1 Pharmacy Resident 01/18/2024 7:53 AM

## 2024-01-18 NOTE — Progress Notes (Signed)
 Physical Therapy Treatment Patient Details [name] MRN: [id number] DOB: May 26, 1956 Today's Date: 01/18/2024   History of Present Illness Pt is a 68 y/o male admitted 01/12/24 with SOB, fatigue, dizziness and found to have L lung base consolidation with loculated effusion.  He underwent chest tube insertion on 01/15/24 with image guidance.  PMH positive for HTN, GERD, metastatic prostate CA, recent PE on Eliquis , GERD, Sleep apnea on CPAP, previous lumbar and cervical spinal surgeries.    PT Comments  Pt resting in bed on arrival, eager for OOB mobility. Pt able to demonstrate bed mobility at mod I level and transfers with supervision for safety. Pt demonstrating short hallway gait with grossly CGA for safety, however distance limited by fatigue, decreased activity tolerance and noted DOE 2-3/4. SpO2 dropping to 77% on RA with mobility, recovering to 94% on 2L. Pt benefiting from cues for breathing techniques to improve SPO2 as well as slow breathing rate as RR up 40 with mobility. Pt was educated on continued walker use to maximize functional independence, safety, and decrease risk for falls. Pt continues to benefit from skilled PT services to progress toward functional mobility goals.      If plan is discharge home, recommend the following: A little help with walking and/or transfers;Help with stairs or ramp for entrance;Assist for transportation   Can travel by private vehicle        Equipment Recommendations  None recommended by PT    Recommendations for Other Services       Precautions / Restrictions Precautions Precautions: Fall Precaution/Restrictions Comments: watch SpO2 Restrictions Weight Bearing Restrictions Per Provider Order: No     Mobility  Bed Mobility Overal bed mobility: Modified Independent                  Transfers Overall transfer level: Needs assistance Equipment used: Rolling walker (2 wheels) Transfers: Sit to/from Stand Sit to Stand:  Supervision           General transfer comment: assist for lines    Ambulation/Gait Ambulation/Gait assistance: Supervision, Contact guard assist Gait Distance (Feet): 95 Feet Assistive device: Rolling walker (2 wheels) Gait Pattern/deviations: Step-through pattern, Decreased stride length, Trunk flexed, Wide base of support       General Gait Details: at times walker getting too far anterior to pt, cues for safety and upright trunk, CGA for balance esp on turns   Stairs             Wheelchair Mobility     Tilt Bed    Modified Rankin (Stroke Patients Only)       Balance Overall balance assessment: Needs assistance   Sitting balance-Leahy Scale: Good     Standing balance support: Bilateral upper extremity supported Standing balance-Leahy Scale: Poor Standing balance comment: UE reliant due to flexed posture                            Communication Communication Communication: No apparent difficulties  Cognition Arousal: Alert Behavior During Therapy: WFL for tasks assessed/performed   PT - Cognitive impairments: No apparent impairments                         Following commands: Intact      Cueing Cueing Techniques: Verbal cues  Exercises      General Comments General comments (skin integrity, edema, etc.): SpO2 dropping to 77% on RA with acitivity, replaced 2L with  SpO2 imprivng to 94% with cues for PLB      Pertinent Vitals/Pain Pain Assessment Pain Assessment: Faces Faces Pain Scale: Hurts a little bit Pain Location: back Pain Descriptors / Indicators: Aching Pain Intervention(s): Repositioned, Monitored during session, Limited activity within patient's tolerance    Home Living                          Prior Function            PT Goals (current goals can now be found in the care plan section) Acute Rehab PT Goals Patient Stated Goal: return to independent PT Goal Formulation: With  patient/family Time For Goal Achievement: 01/29/24 Progress towards PT goals: Progressing toward goals    Frequency    Min 2X/week      PT Plan      Co-evaluation              AM-PAC PT "6 Clicks" Mobility   Outcome Measure  Help needed turning from your back to your side while in a flat bed without using bedrails?: A Little Help needed moving from lying on your back to sitting on the side of a flat bed without using bedrails?: A Little Help needed moving to and from a bed to a chair (including a wheelchair)?: A Little Help needed standing up from a chair using your arms (e.g., wheelchair or bedside chair)?: A Little Help needed to walk in hospital room?: A Little Help needed climbing 3-5 steps with a railing? : A Lot 6 Click Score: 17    End of Session Equipment Utilized During Treatment: Oxygen Activity Tolerance: Patient limited by fatigue Patient left: with call bell/phone within reach;in chair Nurse Communication: Mobility status;Other (comment) (SpO2 levels) PT Visit Diagnosis: Muscle weakness (generalized) (M62.81);Difficulty in walking, not elsewhere classified (R26.2)     Time: 1610-9604 PT Time Calculation (min) (ACUTE ONLY): 24 min  Charges:    $Gait Training: 23-37 mins PT General Charges $$ ACUTE PT VISIT: 1 Visit                     Merlina Marchena R. PTA Acute Rehabilitation Services Office: [phone]   Agapito Horseman 01/18/2024, 12:31 PM

## 2024-01-18 NOTE — Progress Notes (Addendum)
 Patient's BP is on lower side, RR is >20, tachycardic,  he is asymptomatic for hypotension, informed to the MD, no any new orders.   01/18/24 1207  Vitals  Temp 97.7 F (36.5 C)  Temp Source Oral  BP (!) 82/62  MAP (mmHg) 70  BP Location Right Arm  BP Method Automatic  Patient Position (if appropriate) Lying  Pulse Rate (!) 110  Pulse Rate Source Monitor  ECG Heart Rate (!) 109  Resp (!) 25  Level of Consciousness  Level of Consciousness Alert  MEWS COLOR  MEWS Score Color Yellow  Oxygen Therapy  SpO2 97 %  O2 Device Nasal Cannula  O2 Flow Rate (L/min) 3 L/min  MEWS Score  MEWS Temp 0  MEWS Systolic 1  MEWS Pulse 1  MEWS RR 1  MEWS LOC 0  MEWS Score 3

## 2024-01-18 NOTE — Progress Notes (Signed)
 NAME:  SAHITH NURSE, MRN:  [id number], DOB:  1955-11-02, LOS: 4 ADMISSION DATE:  01/14/2024, CONSULTATION DATE:  01/14/24 REFERRING MD:  Margaree Shark - EM, CHIEF COMPLAINT:  SOB // L pleural effusion   History of Present Illness:  68 yo M PMH PE currently on eliquis , former smoker, metastatic prostate cancer on enzalutamide  and sipuleucel-T , HLD, GERD, HTN who presented to [facility] ED 01/14/24 w CC SOB and dizziness. In ED had a CTA chest which reveals a loculated appearing left pleural effusion, enlarged mediastinal lymph nodes + consolidative L lung changes, no evidence of PE.   PCCM is called in this setting and has recommended admission to medicine with PCCM consultation for management of L pleural effusion and CT placement.  Pertinent Medical History:  PE on Eliquis  HX smoking Metastatic prostate cancer HTN HLD GERD   Significant Hospital Events: Including procedures, antibiotic start and stop dates in addition to other pertinent events   5/22 - [facility] ED w SOB. CTA Chest with loculated appearing left pleural effusion 5/23 - Transferred to [facility] for higher level of care. Eliquis  held, heparin  gtt started. 5/23 chest tube placed 5/24 intrapleural fibrinolytics x1  Interim History / Subjective:  No overnight issues. 400 out from chest tube yesterday. Had ct chest overnight  Objective:   Blood pressure 100/85, pulse (!) 107, temperature 97.7 F (36.5 C), temperature source Oral, resp. rate 19, height 6\' 2"  (1.88 m), weight 109.5 kg, SpO2 97%.        Intake/Output Summary (Last 24 hours) at 01/18/2024 0834 Last data filed at 01/18/2024 0500 Gross per 24 hour  Intake 1788.91 ml  Output 1225 ml  Net 563.91 ml   Filed Weights   01/14/24 1133 01/14/24 2133 01/16/24 2045  Weight: 108.9 kg 108.8 kg 109.5 kg   Physical Examination: Overweight, chronically ill, no respiratory distress On nasal cannula Breath sounds diminished bilateral but no wheezes or crackles No peripheral edema Skin  pale Normal speech   Labs and imaging personally reviewed CT Chest early this morning shows resolved pleural effusion, bilateral chronic intersitital changes and emphysema.   Pleural fluid studies consistent with exudative process slight neutrophil predominance, glucose 135.  Cytology pending  Resolved problem List:   Assessment and Plan:   Loculated L pleural effusion S/p 1 dose of intrapleural fibrinolytics with resultant hemothorax Recent PE, on Eliquis  Prostate Cancer on active treatment Recent CAP  Suspect this is infection vs malignancy. No indication for steroids. Will stop as this is affecting CBGs.  Continue PPI, hold protonix .  Ok to discontinue chest tube now that effusion has resolved Complete 5 days of abx for CAP - levaquin or cefaphalosporin given pcm allergy.   Await cytology Will need ambulatory desat study prior to discharge.   I will set him up to see us  in the clinic to make sure no recurrence of effusion, follow up on cytology.   No objection to discharge from pulmonary perspective.    Louie Rover, MD Pulmonary and Critical Care Medicine [facility] 01/18/2024 8:34 AM Pager: see AMION  If no response to pager, please call critical care on call (see AMION) until 7pm After 7:00 pm call Elink      Labs   CBC: Recent Labs  Lab 01/14/24 1200 01/15/24 0842 01/16/24 0803 01/16/24 2102 01/17/24 0035  WBC 11.8* 11.2* 14.4* 18.8* 17.9*  HGB 9.8* 9.4* 9.4* 9.3* 9.0*  HCT 30.5* 28.7* 29.9* 29.7* 28.3*  MCV 98.4 95.7 98.0 95.5 95.6  PLT 400 402* 407*  474* 431*   Basic Metabolic Panel: Recent Labs  Lab 01/14/24 1200 01/14/24 1409 01/15/24 0842 01/16/24 0803 01/16/24 2102 01/17/24 0235  NA 129*  --  130* 130* 132* 132*  K 4.1  --  4.2 4.2 4.3 4.5  CL 96*  --  99 100 98 100  CO2 20*  --  21* 21* 19* 21*  GLUCOSE 134*  --  149* 174* 199* 226*  BUN 14  --  6* 10 13 16   CREATININE 0.90  --  0.69 0.73 1.07 1.16  CALCIUM  8.9  --  8.6*  8.7* 8.4* 8.5*  MG  --  2.1  --  2.0  --   --   PHOS  --   --   --  3.0  --   --    GFR: Estimated Creatinine Clearance: 80.3 mL/min (by C-G formula based on SCr of 1.16 mg/dL). Recent Labs  Lab 01/15/24 0842 01/16/24 0803 01/16/24 2102 01/16/24 2250 01/17/24 0033 01/17/24 0035 01/17/24 0235 01/17/24 0955  PROCALCITON  --   --   --  0.80  --   --   --   --   WBC 11.2* 14.4* 18.8*  --   --  17.9*  --   --   LATICACIDVEN  --   --  2.9*  --  1.8  --  2.6* 3.1*   Liver Function Tests: Recent Labs  Lab 01/14/24 1200 01/15/24 1513  AST 28  --   ALT 20  --   ALKPHOS 82  --   BILITOT 0.3  --   PROT 7.5 6.2*  ALBUMIN 2.8*  --    No results for input(s): "LIPASE", "AMYLASE" in the last 168 hours. No results for input(s): "AMMONIA" in the last 168 hours.  ABG:    Component Value Date/Time   PHART 7.44 01/16/2024 2029   PCO2ART 28 (L) 01/16/2024 2029   PO2ART 89 01/16/2024 2029   HCO3 19.0 (L) 01/16/2024 2029   TCO2 24 11/26/2010 0919   ACIDBASEDEF 3.8 (H) 01/16/2024 2029   O2SAT 96.8 01/16/2024 2029

## 2024-01-18 NOTE — Progress Notes (Signed)
 PROGRESS NOTE    [name]  ZOX:[id number] DOB: 1955/09/01 DOA: 01/14/2024 PCP: [name], MD   Brief Narrative:  This 68 yrs old male with PMH significant for hypertension, hyperlipidemia, GERD, metastatic prostate cancer followed by Dr. [name], on enzalutamide  and sipuleucel-T , recent hospitalization where he was diagnosed with PE, started on Eliquis , presents to ED secondary to shortness of breath, fatigue, dizziness, lightheadedness, worsening dyspnea over the last month. In ED lactic acid at 2.0, white blood cell count at 11.8, CTA chest obtained, no evidence of PE, but significant for left lung base consolidation with significant loculated pleural effusion,.  Patient was admitted for further evaluation.  Pulmonology is consulted.   Assessment & Plan:   Principal Problem:   Loculated pleural effusion Active Problems:   Essential hypertension   Obesity (BMI 30-39.9)   COPD (chronic obstructive pulmonary disease) with chronic bronchitis (HCC)   GERD (gastroesophageal reflux disease)   Type 2 diabetes mellitus with obesity (HCC)   Prostate cancer metastatic to bone (HCC)   Pulmonary embolism (HCC)   Pleural effusion, left   History of pulmonary embolism   Loculated left pleural effusion: Status post intrapleural fibrinolytics with hemothorax: Sepsis due to HCAP: Patient presented with sepsis criteria (elevated lactic acid, leukocytosis, tachypnea and tachycardia) There is high concern for aspiration, his wife reports significant coughing / choking when eating.  Continue on dysphagia diet, Speech and swallow evaluation recommended same. Initiated on empiric antibiotics ( Vancomycin , Azactam and Flagyl  ) Encourage use of  incentive spirometry and flutter valve. Pulmonology consulted , Patient underwent successful chest tube insertion. Maintain chest tube to suction,  awaiting cytology. Continue heparin .  Continue supplemental oxygen. Status post intrapleural  fibrinolytic therapy,  Patient has developed high output in the chest tube concerning for hemothorax.  Patient became hypotensive,  tachycardic and was transferred to ICU. Now the chest tube output has reduced,  no further into pleural fibrinolytics for now. Likely chest tube will be removed today.   Pulmonology recommended outpatient antibiotics for 5 days based on cultures..  Pulmonary embolism: Diagnosed with PE and started on Eliquis  upon recent hospitalization.   Has been compliant with this medication. Patient placed on heparin  as he might have required chest tube insertion. Will resume Eliquis  as chest tube is going to be removed today   Prolonged QT interval: Hold  QT prolonging agents for now. Hold Abilify , Plaquenil, Effexor and desipramine   Essential hypertension: Continue home meds   Mixed hyperlipidemia: Continue Crestor , fenofibrate .   GERD: Continue Protonix .   Metastatic prostate cancer: Patient follows with Dr. [name], [name] For now we will hold his Xtandi  due to acute infection   Rheumatoid  arthritis: -hold Plaquenil due to prolonged QTc   DVT prophylaxis:Heparin  Code Status: Full code Family Communication: No family at bed side. Disposition Plan:   Status is: Inpatient Remains inpatient appropriate because: Severity of illness.   Consultants:  Pulmonology  Procedures: None  Antimicrobials:  Anti-infectives (From admission, onward)    Start     Dose/Rate Route Frequency Ordered Stop   01/18/24 1000  levofloxacin (LEVAQUIN) tablet 750 mg        750 mg Oral Daily 01/18/24 0854 01/18/24 1021   01/15/24 1000  vancomycin  (VANCOCIN ) IVPB 1000 mg/200 mL premix  Status:  Discontinued        1,000 mg 200 mL/hr over 60 Minutes Intravenous Every 12 hours 01/14/24 2030 01/18/24 0854   01/15/24 0000  aztreonam (AZACTAM) 2 g in sodium chloride  0.9 %  100 mL IVPB  Status:  Discontinued        2 g 200 mL/hr over 30 Minutes Intravenous Every 8 hours 01/14/24  2030 01/18/24 0854   01/14/24 2100  metroNIDAZOLE  (FLAGYL ) IVPB 500 mg  Status:  Discontinued        500 mg 100 mL/hr over 60 Minutes Intravenous Every 12 hours 01/14/24 2001 01/18/24 0854   01/14/24 2100  vancomycin  (VANCOCIN ) IVPB 1000 mg/200 mL premix        1,000 mg 200 mL/hr over 60 Minutes Intravenous  Once 01/14/24 2030 01/15/24 0028   01/14/24 1730  vancomycin  (VANCOCIN ) IVPB 1000 mg/200 mL premix        1,000 mg 200 mL/hr over 60 Minutes Intravenous  Once 01/14/24 1715 01/14/24 1937   01/14/24 1600  doxycycline  (VIBRAMYCIN ) 100 mg in sodium chloride  0.9 % 250 mL IVPB        100 mg 125 mL/hr over 120 Minutes Intravenous  Once 01/14/24 1518 01/14/24 1939   01/14/24 1530  aztreonam (AZACTAM) 2 g in sodium chloride  0.9 % 100 mL IVPB        2 g 200 mL/hr over 30 Minutes Intravenous  Once 01/14/24 1519 01/14/24 1654      Subjective: Patient was seen and examined at bedside. Overnight events noted. Patient was sitting comfortably on the bed.  States he is feeling better Now, blood pressure has improved. Chest tube will likely be removed today.  Objective: Vitals:   01/17/24 2130 01/17/24 2231 01/18/24 0340 01/18/24 0806  BP: 94/62 91/65 (!) 89/68 100/85  Pulse: 100 98 (!) 102 (!) 107  Resp: (!) 28 (!) 31 20 19   Temp:  98 F (36.7 C) 97.7 F (36.5 C) 97.7 F (36.5 C)  TempSrc:  Oral Oral Oral  SpO2: 99% 100% 99% 97%  Weight:      Height:        Intake/Output Summary (Last 24 hours) at 01/18/2024 1140 Last data filed at 01/18/2024 0800 Gross per 24 hour  Intake 1412.85 ml  Output 845 ml  Net 567.85 ml   Filed Weights   01/14/24 1133 01/14/24 2133 01/16/24 2045  Weight: 108.9 kg 108.8 kg 109.5 kg    Examination:  General exam: Appears calm and comfortable, not in any acute distress. Respiratory system: CTA bilaterally. Respiratory effort normal.  RR 14, Left sided chest tube. Cardiovascular system: S1 & S2 heard, RRR. No JVD, murmurs, rubs, gallops or  clicks. Gastrointestinal system: Abdomen is non distended, soft and non tender.  Normal bowel sounds heard. Central nervous system: Alert and oriented x 3. No focal neurological deficits. Extremities: No edema, no cyanosis, no clubbing. Skin: No rashes, lesions or ulcers Psychiatry: Judgement and insight appear normal. Mood & affect appropriate.     Data Reviewed: I have personally reviewed following labs and imaging studies  CBC: Recent Labs  Lab 01/15/24 0842 01/16/24 0803 01/16/24 2102 01/17/24 0035 01/18/24 0802  WBC 11.2* 14.4* 18.8* 17.9* 16.0*  HGB 9.4* 9.4* 9.3* 9.0* 7.7*  HCT 28.7* 29.9* 29.7* 28.3* 24.9*  MCV 95.7 98.0 95.5 95.6 99.6  PLT 402* 407* 474* 431* 429*   Basic Metabolic Panel: Recent Labs  Lab 01/14/24 1409 01/15/24 0842 01/16/24 0803 01/16/24 2102 01/17/24 0235 01/18/24 0802  NA  --  130* 130* 132* 132* 129*  K  --  4.2 4.2 4.3 4.5 4.4  CL  --  99 100 98 100 98  CO2  --  21* 21* 19* 21* 22  GLUCOSE  --  149* 174* 199* 226* 139*  BUN  --  6* 10 13 16  27*  CREATININE  --  0.69 0.73 1.07 1.16 0.99  CALCIUM   --  8.6* 8.7* 8.4* 8.5* 8.1*  MG 2.1  --  2.0  --   --  2.1  PHOS  --   --  3.0  --   --  2.4*   GFR: Estimated Creatinine Clearance: 94 mL/min (by C-G formula based on SCr of 0.99 mg/dL). Liver Function Tests: Recent Labs  Lab 01/14/24 1200 01/15/24 1513  AST 28  --   ALT 20  --   ALKPHOS 82  --   BILITOT 0.3  --   PROT 7.5 6.2*  ALBUMIN 2.8*  --    No results for input(s): "LIPASE", "AMYLASE" in the last 168 hours. No results for input(s): "AMMONIA" in the last 168 hours. Coagulation Profile: No results for input(s): "INR", "PROTIME" in the last 168 hours. Cardiac Enzymes: No results for input(s): "CKTOTAL", "CKMB", "CKMBINDEX", "TROPONINI" in the last 168 hours. BNP (last 3 results) No results for input(s): "PROBNP" in the last 8760 hours. HbA1C: Recent Labs    01/17/24 0033  HGBA1C 6.1*   CBG: Recent Labs  Lab  01/17/24 1547 01/17/24 1955 01/17/24 2318 01/18/24 0344 01/18/24 0821  GLUCAP 219* 150* 147* 138* 153*   Lipid Profile: No results for input(s): "CHOL", "HDL", "LDLCALC", "TRIG", "CHOLHDL", "LDLDIRECT" in the last 72 hours. Thyroid  Function Tests: No results for input(s): "TSH", "T4TOTAL", "FREET4", "T3FREE", "THYROIDAB" in the last 72 hours. Anemia Panel: No results for input(s): "VITAMINB12", "FOLATE", "FERRITIN", "TIBC", "IRON", "RETICCTPCT" in the last 72 hours. Sepsis Labs: Recent Labs  Lab 01/16/24 2102 01/16/24 2250 01/17/24 0033 01/17/24 0235 01/17/24 0955  PROCALCITON  --  0.80  --   --   --   LATICACIDVEN 2.9*  --  1.8 2.6* 3.1*    Recent Results (from the past 240 hours)  Resp panel by RT-PCR (RSV, Flu A&B, Covid) Anterior Nasal Swab     Status: None   Collection Time: 01/14/24 12:09 PM   Specimen: Anterior Nasal Swab  Result Value Ref Range Status   SARS Coronavirus 2 by RT PCR NEGATIVE NEGATIVE Final    Comment: (NOTE) SARS-CoV-2 target nucleic acids are NOT DETECTED.  The SARS-CoV-2 RNA is generally detectable in upper respiratory specimens during the acute phase of infection. The lowest concentration of SARS-CoV-2 viral copies this assay can detect is 138 copies/mL. A negative result does not preclude SARS-Cov-2 infection and should not be used as the sole basis for treatment or other patient management decisions. A negative result may occur with  improper specimen collection/handling, submission of specimen other than nasopharyngeal swab, presence of viral mutation(s) within the areas targeted by this assay, and inadequate number of viral copies(<138 copies/mL). A negative result must be combined with clinical observations, patient history, and epidemiological information. The expected result is Negative.  Fact Sheet for Patients:  BloggerCourse.com  Fact Sheet for Healthcare Providers:   SeriousBroker.it  This test is no t yet approved or cleared by the [place]  FDA and  has been authorized for detection and/or diagnosis of SARS-CoV-2 by FDA under an Emergency Use Authorization (EUA). This EUA will remain  in effect (meaning this test can be used) for the duration of the COVID-19 declaration under Section 564(b)(1) of the Act, 21 U.S.C.section 360bbb-3(b)(1), unless the authorization is terminated  or revoked sooner.       Influenza A  by PCR NEGATIVE NEGATIVE Final   Influenza B by PCR NEGATIVE NEGATIVE Final    Comment: (NOTE) The Xpert Xpress SARS-CoV-2/FLU/RSV plus assay is intended as an aid in the diagnosis of influenza from Nasopharyngeal swab specimens and should not be used as a sole basis for treatment. Nasal washings and aspirates are unacceptable for Xpert Xpress SARS-CoV-2/FLU/RSV testing.  Fact Sheet for Patients: BloggerCourse.com  Fact Sheet for Healthcare Providers: SeriousBroker.it  This test is not yet approved or cleared by the [place]  FDA and has been authorized for detection and/or diagnosis of SARS-CoV-2 by FDA under an Emergency Use Authorization (EUA). This EUA will remain in effect (meaning this test can be used) for the duration of the COVID-19 declaration under Section 564(b)(1) of the Act, 21 U.S.C. section 360bbb-3(b)(1), unless the authorization is terminated or revoked.     Resp Syncytial Virus by PCR NEGATIVE NEGATIVE Final    Comment: (NOTE) Fact Sheet for Patients: BloggerCourse.com  Fact Sheet for Healthcare Providers: SeriousBroker.it  This test is not yet approved or cleared by the [place]  FDA and has been authorized for detection and/or diagnosis of SARS-CoV-2 by FDA under an Emergency Use Authorization (EUA). This EUA will remain in effect (meaning this test can be used) for  the duration of the COVID-19 declaration under Section 564(b)(1) of the Act, 21 U.S.C. section 360bbb-3(b)(1), unless the authorization is terminated or revoked.  Performed at [facility], [address] [place], [place] [address]   Culture, blood (routine x 2)     Status: None (Preliminary result)   Collection Time: 01/14/24  5:00 PM   Specimen: BLOOD  Result Value Ref Range Status   Specimen Description BLOOD BLOOD RIGHT HAND  Final   Special Requests   Final    BOTTLES DRAWN AEROBIC AND ANAEROBIC Blood Culture adequate volume   Culture   Final    NO GROWTH 4 DAYS Performed at [facility], [address] [place], [place] [address]    Report Status PENDING  Incomplete  Culture, blood (routine x 2)     Status: None (Preliminary result)   Collection Time: 01/14/24  5:47 PM   Specimen: BLOOD  Result Value Ref Range Status   Specimen Description BLOOD RIGHT ANTECUBITAL  Final   Special Requests   Final    BOTTLES DRAWN AEROBIC ONLY Blood Culture adequate volume   Culture   Final    NO GROWTH 4 DAYS Performed at [facility], [address], [place], [place] [address]    Report Status PENDING  Incomplete  MRSA Next Gen by PCR, Nasal     Status: None   Collection Time: 01/15/24 12:12 AM   Specimen: Urine, Clean Catch; Nasal Swab  Result Value Ref Range Status   MRSA by PCR Next Gen NOT DETECTED NOT DETECTED Final    Comment: (NOTE) The GeneXpert MRSA Assay (FDA approved for NASAL specimens only), is one component of a comprehensive MRSA colonization surveillance program. It is not intended to diagnose MRSA infection nor to guide or monitor treatment for MRSA infections. Test performance is not FDA approved in patients less than 67 years old. Performed at [facility], [address], [place], [place] [address]   Body fluid culture w Gram Stain     Status: None   Collection Time: 01/15/24  2:06 PM   Specimen: Pleural Fluid  Result Value Ref Range Status   Specimen  Description PLEURAL  Final   Special Requests left  Final  Gram Stain NO WBC SEEN NO ORGANISMS SEEN   Final   Culture   Final    NO GROWTH 3 DAYS Performed at [facility], [address] [place], [place] [address]    Report Status 01/18/2024 FINAL  Final  Culture, blood (Routine X 2) w Reflex to ID Panel     Status: None (Preliminary result)   Collection Time: 01/16/24 10:50 PM   Specimen: BLOOD RIGHT ARM  Result Value Ref Range Status   Specimen Description BLOOD RIGHT ARM  Final   Special Requests   Final    BOTTLES DRAWN AEROBIC AND ANAEROBIC Blood Culture adequate volume   Culture   Final    NO GROWTH 2 DAYS Performed at [facility], [address], [place], [place] [address]    Report Status PENDING  Incomplete  Culture, blood (Routine X 2) w Reflex to ID Panel     Status: None (Preliminary result)   Collection Time: 01/16/24 10:50 PM   Specimen: BLOOD LEFT HAND  Result Value Ref Range Status   Specimen Description BLOOD LEFT HAND  Final   Special Requests   Final    BOTTLES DRAWN AEROBIC AND ANAEROBIC Blood Culture results may not be optimal due to an inadequate volume of blood received in culture bottles   Culture   Final    NO GROWTH 2 DAYS Performed at [facility], [address], [place], [place] [address]    Report Status PENDING  Incomplete    Radiology Studies: CT CHEST WO CONTRAST Result Date: 01/18/2024 CLINICAL DATA:  Pleural effusion, new no suspected, chest 2 EXAM: CT CHEST WITHOUT CONTRAST TECHNIQUE: Multidetector CT imaging of the chest was performed following the standard protocol without IV contrast. RADIATION DOSE REDUCTION: This exam was performed according to the departmental dose-optimization program which includes automated exposure control, adjustment of the mA and/or kV according to patient size and/or use of iterative reconstruction technique. COMPARISON:  Three days ago FINDINGS: Cardiovascular: Normal heart sounds. Small  volume pericardial fluid which is similar to prior. No acute vascular finding. Scattered atheromatous calcification. Mediastinum/Nodes: Unchanged moderate enlargement of lymph nodes in the prevascular and central mediastinum. Unchanged reticulation of mediastinal fat the lower heart. Lungs/Pleura: Significant decrease in left pleural effusion with mild residual seen the lower major fissure. There is question of nodular thickening along the left pleura, but varies measuring water  density with no activity on recent PET CT in these areas, can correlate with pleural fluid labs. Small bore catheter is in good position at the left posterior pleural cavity. Reticulation in the bilateral lungs (patchy ground-glass opacity on recent imaging) and paramediastinal consolidative type opacity in the left upper lobe. There is a degree chronic lung disease with paraseptal emphysema, although fairly well aerated lungs at PET CT 09/07/2023. Upper Abdomen: Negative Musculoskeletal: No acute finding IMPRESSION: Nearly resolved left pleural effusion with chest tube remaining in place at the lateral left base. Nodular appearance along the left pleural surface has no correlate on recent PET CT, correlate with pleural fluid labs. No progression pulmonary opacity and mediastinal adenopathy-infection, malignancy, and drug reaction differential considerations based on recent CTs. Electronically Signed   By: Ronnette Coke M.D.   On: 01/18/2024 04:13   DG Chest Port 1 View Result Date: 01/17/2024 CLINICAL DATA:  Acute respiratory failure. EXAM: PORTABLE CHEST 1 VIEW COMPARISON:  01/16/2024 FINDINGS: There is a left-sided pigtail drainage catheter overlying the left lower lung. The left-sided pleural fluid collection  is decreased from previous exam. Chronic, bilateral reticular interstitial opacities with a lower lung zone predominance. No significant pneumothorax identified. IMPRESSION: 1. Left-sided pigtail drainage catheter overlying the  left lower lung. Decreased left pleural fluid collection. No pneumothorax. 2. Chronic interstitial changes as noted previously. Electronically Signed   By: Kimberley Penman M.D.   On: 01/17/2024 08:49   DG Chest Port 1 View Result Date: 01/16/2024 CLINICAL DATA:  Acute respiratory distress EXAM: PORTABLE CHEST 1 VIEW COMPARISON:  X-ray earlier 01/16/2024 and older. FINDINGS: Film is rotated to left. Left basilar pigtail catheter is again seen and has slightly retracted compared to the previous x-ray. Persistent left-sided effusion adjacent lung opacity. Masslike fullness of the left lung hilum again seen. Please correlate with prior CTA of the chest. Stable cardiopericardial silhouette. Underinflation with some right basilar opacity again seen. No pneumothorax. Overlapping cardiac leads. Fixation hardware along the lower cervical spine. IMPRESSION: Interval retraction of the pigtail catheter at the left lung base. No pneumothorax. No significant oval change when adjusted for technique otherwise. Electronically Signed   By: Adrianna Horde M.D.   On: 01/16/2024 19:55   Scheduled Meds:  Chlorhexidine  Gluconate Cloth  6 each Topical q morning   feeding supplement  237 mL Oral BID BM   ferrous sulfate  325 mg Oral Daily   furosemide  20 mg Oral Daily   insulin aspart  0-15 Units Subcutaneous Q4H   mouth rinse  15 mL Mouth Rinse 4 times per day   pantoprazole   40 mg Oral QAC breakfast   rosuvastatin   40 mg Oral Daily   sodium chloride  flush  10 mL Intrapleural Q8H   Continuous Infusions:  heparin  2,000 Units/hr (01/18/24 0207)     LOS: 4 days    Time spent: 35 mins    Magdalene School, MD [facility]   If 7PM-7AM, please contact night-coverage

## 2024-01-18 NOTE — Telephone Encounter (Signed)
 3 week follow up with app or nd for f/u on pleural fluid cytology

## 2024-01-18 NOTE — Plan of Care (Signed)
  Problem: Clinical Measurements: Goal: Will remain free from infection Outcome: Progressing   Problem: Clinical Measurements: Goal: Diagnostic test results will improve Outcome: Progressing   Problem: Clinical Measurements: Goal: Respiratory complications will improve Outcome: Progressing   Problem: Coping: Goal: Level of anxiety will decrease Outcome: Progressing   Problem: Nutrition: Goal: Adequate nutrition will be maintained Outcome: Progressing   Problem: Elimination: Goal: Will not experience complications related to bowel motility Outcome: Progressing Goal: Will not experience complications related to urinary retention Outcome: Progressing

## 2024-01-18 NOTE — Progress Notes (Addendum)
 Pt complaint of trouble breathing and unable to cough, he was tacypneic,  Spo2 was >90% on [place] 3ltrs, denied any chest pain, had expiratory wheezes. Vital's obtained, PRN Albuterol  2.5mg  nebulization given.   1755.. patient verbalized feeling better after the treatment.   01/18/24 1732  Vitals  BP 99/88  MAP (mmHg) 94  BP Location Left Arm  BP Method Automatic  Patient Position (if appropriate) Lying  Pulse Rate (!) 115  Pulse Rate Source Monitor  ECG Heart Rate (!) 117  Resp (!) 27  Level of Consciousness  Level of Consciousness Alert  MEWS COLOR  MEWS Score Color Red  Oxygen Therapy  SpO2 99 %  O2 Device Nasal Cannula  O2 Flow Rate (L/min) 3 L/min  MEWS Score  MEWS Temp 0  MEWS Systolic 1  MEWS Pulse 2  MEWS RR 2  MEWS LOC 0  MEWS Score 5   ,

## 2024-01-18 NOTE — Progress Notes (Signed)
 Confirmed with the MD for chest tube removal, tube removed without any issues, occlusive dressing applied, V/S obtained, patient denies any symptoms. Will continue to monitor.

## 2024-01-19 ENCOUNTER — Inpatient Hospital Stay (HOSPITAL_COMMUNITY)

## 2024-01-19 DIAGNOSIS — J90 Pleural effusion, not elsewhere classified: Secondary | ICD-10-CM | POA: Diagnosis not present

## 2024-01-19 DIAGNOSIS — D62 Acute posthemorrhagic anemia: Secondary | ICD-10-CM | POA: Diagnosis not present

## 2024-01-19 DIAGNOSIS — C7951 Secondary malignant neoplasm of bone: Secondary | ICD-10-CM | POA: Diagnosis not present

## 2024-01-19 DIAGNOSIS — C61 Malignant neoplasm of prostate: Secondary | ICD-10-CM | POA: Diagnosis not present

## 2024-01-19 LAB — GLUCOSE, CAPILLARY
Glucose-Capillary: 122 mg/dL — ABNORMAL HIGH (ref 70–99)
Glucose-Capillary: 128 mg/dL — ABNORMAL HIGH (ref 70–99)
Glucose-Capillary: 145 mg/dL — ABNORMAL HIGH (ref 70–99)
Glucose-Capillary: 156 mg/dL — ABNORMAL HIGH (ref 70–99)
Glucose-Capillary: 157 mg/dL — ABNORMAL HIGH (ref 70–99)
Glucose-Capillary: 196 mg/dL — ABNORMAL HIGH (ref 70–99)

## 2024-01-19 LAB — CBC
HCT: 20.3 % — ABNORMAL LOW (ref 39.0–52.0)
Hemoglobin: 6.7 g/dL — CL (ref 13.0–17.0)
MCH: 31.3 pg (ref 26.0–34.0)
MCHC: 33 g/dL (ref 30.0–36.0)
MCV: 94.9 fL (ref 80.0–100.0)
Platelets: 404 10*3/uL — ABNORMAL HIGH (ref 150–400)
RBC: 2.14 MIL/uL — ABNORMAL LOW (ref 4.22–5.81)
RDW: 14.4 % (ref 11.5–15.5)
WBC: 14.9 10*3/uL — ABNORMAL HIGH (ref 4.0–10.5)
nRBC: 0.3 % — ABNORMAL HIGH (ref 0.0–0.2)

## 2024-01-19 LAB — PREPARE RBC (CROSSMATCH)

## 2024-01-19 LAB — PHOSPHORUS: Phosphorus: 2.3 mg/dL — ABNORMAL LOW (ref 2.5–4.6)

## 2024-01-19 LAB — CULTURE, BLOOD (ROUTINE X 2)
Culture: NO GROWTH
Culture: NO GROWTH
Special Requests: ADEQUATE
Special Requests: ADEQUATE

## 2024-01-19 LAB — BASIC METABOLIC PANEL WITH GFR
Anion gap: 8 (ref 5–15)
BUN: 32 mg/dL — ABNORMAL HIGH (ref 8–23)
CO2: 22 mmol/L (ref 22–32)
Calcium: 8.2 mg/dL — ABNORMAL LOW (ref 8.9–10.3)
Chloride: 96 mmol/L — ABNORMAL LOW (ref 98–111)
Creatinine, Ser: 0.91 mg/dL (ref 0.61–1.24)
GFR, Estimated: >60 mL/min (ref >60)
Glucose, Bld: 128 mg/dL — ABNORMAL HIGH (ref 70–99)
Potassium: 4.4 mmol/L (ref 3.5–5.1)
Sodium: 126 mmol/L — ABNORMAL LOW (ref 135–145)

## 2024-01-19 LAB — HEMOGLOBIN AND HEMATOCRIT, BLOOD
HCT: 25.4 % — ABNORMAL LOW (ref 39.0–52.0)
Hemoglobin: 8.3 g/dL — ABNORMAL LOW (ref 13.0–17.0)

## 2024-01-19 LAB — MAGNESIUM: Magnesium: 2.1 mg/dL (ref 1.7–2.4)

## 2024-01-19 LAB — HEPARIN LEVEL (UNFRACTIONATED): Heparin Unfractionated: 0.64 [IU]/mL (ref 0.30–0.70)

## 2024-01-19 MED ORDER — IOHEXOL 350 MG/ML SOLN
75.0000 mL | Freq: Once | INTRAVENOUS | Status: AC | PRN
  Administered 2024-01-19: 75 mL via INTRAVENOUS

## 2024-01-19 MED ORDER — SODIUM CHLORIDE 1 G PO TABS
1.0000 g | ORAL_TABLET | Freq: Three times a day (TID) | ORAL | Status: DC
  Administered 2024-01-19 – 2024-01-21 (×7): 1 g via ORAL
  Filled 2024-01-19 (×7): qty 1

## 2024-01-19 MED ORDER — SODIUM CHLORIDE 0.9% IV SOLUTION: Freq: Once | INTRAVENOUS | Status: DC

## 2024-01-19 NOTE — Progress Notes (Addendum)
 Acute on chronic anemia S/p intrapleural fibrinolysis with hemothorax-resolved New diagnosis of PE in the setting of active cancer - Labs this morning showing hemoglobin gradually has been dropped over the course of last few days and this morning 6.7.  Patient is still requiring heparin  drip in the setting of PE and s/p intrapleural fibrinolytic therapy complicated with hemothorax placed chest tube and s/p removal of chest tube on 5/26 as CT scan showed near resolution of pleural effusion. -Currently patient does not have any evidence of hemoptysis, hematemesis melena. -Currently patient is on heparin  drip.   -Given repeat CT scan from 5/25 showed resolution of PE (initially diagnosed in PE 12/01/2023) holding heparin  drip in the setting of low hemoglobin. -Transfusing 2 unit of blood and need to recheck repeat hemoglobin level in the afternoon.  If hemoglobin level stabilized and patient does not develops any evidence of bleeding in that case consider to start oral Eliquis  back.  Will defer it to daytime physician regarding decision of resumption of anticoagulation. -Informed patient's RN to hold the heparin  drip as of now at 4 AM.    Michael Hobbins, MD [facility] 01/19/2024, 4:08 AM

## 2024-01-19 NOTE — Progress Notes (Signed)
 NAME:  [name], MRN:  [id number], DOB:  1956-05-24, LOS: 5 ADMISSION DATE:  01/14/2024, CONSULTATION DATE:  01/14/24 REFERRING MD:  Margaree Shark - EM, CHIEF COMPLAINT:  SOB // L pleural effusion   History of Present Illness:  68 yo M PMH PE currently on eliquis , former smoker, metastatic prostate cancer on enzalutamide  and sipuleucel-T , HLD, GERD, HTN who presented to [facility] ED 01/14/24 w CC SOB and dizziness. In ED had a CTA chest which reveals a loculated appearing left pleural effusion, enlarged mediastinal lymph nodes + consolidative L lung changes, no evidence of PE.   PCCM is called in this setting and has recommended admission to medicine with PCCM consultation for management of L pleural effusion and CT placement.  Pertinent Medical History:  PE on Eliquis  HX smoking Metastatic prostate cancer HTN HLD GERD   Significant Hospital Events: Including procedures, antibiotic start and stop dates in addition to other pertinent events   5/22 - [facility] ED w SOB. CTA Chest with loculated appearing left pleural effusion 5/23 - Transferred to [facility] for higher level of care. Eliquis  held, heparin  gtt started. 5/23 chest tube placed 5/24 intrapleural fibrinolytics x1 resulting in large hemothorax and ICU transfer 5/25 transferred back out of ICU, output slowed 5/26 tube pulled out due to low output, CT Chest with resolved effusion  Interim History / Subjective:  Called back for tachycardia, tachypnea, chest pain and 2 point drop in hgb for concern for hemothorax. Received 2 units prbc.  Objective:   Blood pressure 100/69, pulse (!) 105, temperature 97.6 F (36.4 C), temperature source Oral, resp. rate (!) 28, height 6\' 2"  (1.88 m), weight 109.5 kg, SpO2 100%.        Intake/Output Summary (Last 24 hours) at 01/19/2024 1644 Last data filed at 01/19/2024 1557 Gross per 24 hour  Intake 1405 ml  Output 2250 ml  Net -845 ml   Filed Weights   01/14/24 1133 01/14/24 2133 01/16/24 2045  Weight:  108.9 kg 108.8 kg 109.5 kg   Physical Examination: Chronically ill overweight  Tachypnic, breath sounds diminished bilaterally Tachycardic, regular  Labs and imaging personally reviewed Chest xray this afternoon shows loculated effusion left side.   Pleural fluid studies consistent with exudative process slight neutrophil predominance, glucose 135.  Cytology still pending    Resolved problem List:   Assessment and Plan:   Loculated L pleural effusion S/p 1 dose of intrapleural fibrinolytics with resultant hemothorax Acute blood loss anemia, concern for recurrent hemothorax.  Recent PE, on Eliquis  Prostate Cancer on active treatment Recent CAP s/p 5 adys abx   Bedside ultrasound shows possible blood vs atelectatic lung but not a pocket I feel confident draining at bedside. Will get CT Chest for more clarity if any instability overnight please call ICU team. Hold heparin /eliquis . S/p u units prbc. Still awaiting cytology  Pulmonary will follow.     Louie Rover, MD Pulmonary and Critical Care Medicine [facility] 01/19/2024 4:44 PM Pager: see AMION  If no response to pager, please call critical care on call (see AMION) until 7pm After 7:00 pm call Elink      Labs   CBC: Recent Labs  Lab 01/16/24 0803 01/16/24 2102 01/17/24 0035 01/18/24 0802 01/19/24 0325  WBC 14.4* 18.8* 17.9* 16.0* 14.9*  HGB 9.4* 9.3* 9.0* 7.7* 6.7*  HCT 29.9* 29.7* 28.3* 24.9* 20.3*  MCV 98.0 95.5 95.6 99.6 94.9  PLT 407* 474* 431* 429* 404*   Basic Metabolic Panel: Recent Labs  Lab  01/14/24 1409 01/15/24 4098 01/16/24 0803 01/16/24 2102 01/17/24 0235 01/18/24 0802 01/19/24 0325  NA  --    < > 130* 132* 132* 129* 126*  K  --    < > 4.2 4.3 4.5 4.4 4.4  CL  --    < > 100 98 100 98 96*  CO2  --    < > 21* 19* 21* 22 22  GLUCOSE  --    < > 174* 199* 226* 139* 128*  BUN  --    < > 10 13 16  27* 32*  CREATININE  --    < > 0.73 1.07 1.16 0.99 0.91  CALCIUM   --    < > 8.7*  8.4* 8.5* 8.1* 8.2*  MG 2.1  --  2.0  --   --  2.1 2.1  PHOS  --   --  3.0  --   --  2.4* 2.3*   < > = values in this interval not displayed.   GFR: Estimated Creatinine Clearance: 102.3 mL/min (by C-G formula based on SCr of 0.91 mg/dL). Recent Labs  Lab 01/16/24 2102 01/16/24 2250 01/17/24 0033 01/17/24 0035 01/17/24 0235 01/17/24 0955 01/18/24 0802 01/19/24 0325  PROCALCITON  --  0.80  --   --   --   --   --   --   WBC 18.8*  --   --  17.9*  --   --  16.0* 14.9*  LATICACIDVEN 2.9*  --  1.8  --  2.6* 3.1*  --   --    Liver Function Tests: Recent Labs  Lab 01/14/24 1200 01/15/24 1513  AST 28  --   ALT 20  --   ALKPHOS 82  --   BILITOT 0.3  --   PROT 7.5 6.2*  ALBUMIN 2.8*  --    No results for input(s): "LIPASE", "AMYLASE" in the last 168 hours. No results for input(s): "AMMONIA" in the last 168 hours.  ABG:    Component Value Date/Time   PHART 7.44 01/16/2024 2029   PCO2ART 28 (L) 01/16/2024 2029   PO2ART 89 01/16/2024 2029   HCO3 19.0 (L) 01/16/2024 2029   TCO2 24 11/26/2010 0919   ACIDBASEDEF 3.8 (H) 01/16/2024 2029   O2SAT 96.8 01/16/2024 2029

## 2024-01-19 NOTE — Assessment & Plan Note (Signed)
 Met sepsis criteria on admission.  Agree with empiric antibiotics with clinical improvement Follow up cytology

## 2024-01-19 NOTE — Telephone Encounter (Signed)
 LM for PT to call fo soonest appt w/NP or MD. f/u on pleural fluid cytology

## 2024-01-19 NOTE — Progress Notes (Addendum)
 PROGRESS NOTE    [name]  ZOX:[id number] DOB: 08-29-1955 DOA: 01/14/2024 PCP: Michael Parkins, MD   Brief Narrative:  This 68 yrs old male with PMH significant for hypertension, hyperlipidemia, GERD, metastatic prostate cancer followed by [name], on enzalutamide  and sipuleucel-T , recent hospitalization where he was diagnosed with PE, started on Eliquis , presents to ED secondary to shortness of breath, fatigue, dizziness, lightheadedness, worsening dyspnea over the last month. In ED lactic acid at 2.0, white blood cell count at 11.8, CTA chest obtained, no evidence of PE, but significant for left lung base consolidation with significant loculated pleural effusion,.  [name] was admitted for further evaluation.  Pulmonology is consulted.   Assessment & Plan:   Principal Problem:   Loculated pleural effusion Active Problems:   Essential hypertension   Obesity (BMI 30-39.9)   COPD (chronic obstructive pulmonary disease) with chronic bronchitis (HCC)   GERD (gastroesophageal reflux disease)   Type 2 diabetes mellitus with obesity (HCC)   Prostate cancer metastatic to bone (HCC)   Pulmonary embolism (HCC)   Pleural effusion, left   History of pulmonary embolism   Loculated left pleural effusion: Status post intrapleural fibrinolytics with hemothorax: Sepsis due to HCAP: [name] presented with sepsis criteria (elevated lactic acid, leukocytosis, tachypnea and tachycardia) There is high concern for aspiration, his Michael reports significant coughing / choking when eating.  Continue on dysphagia diet, Speech and swallow evaluation recommended same. Initiated on empiric antibiotics ( Vancomycin , Azactam and Flagyl  ) Encourage use of  incentive spirometry and flutter valve. Pulmonology consulted , [name] underwent successful chest tube insertion. Maintain chest tube to suction,  awaiting cytology. Continue heparin .  Continue supplemental oxygen. Status post intrapleural  fibrinolytic therapy,  [name] has developed high output in the chest tube concerning for hemothorax.  [name] became hypotensive,  tachycardic and was transferred to ICU. Now the chest tube output has reduced,  no further into pleural fibrinolytics for now. Chest for successfully removed 01/18/24. Pulmonology recommended outpatient antibiotics for 5 days based on cultures..  Pulmonary embolism: Diagnosed with PE and started on Eliquis  upon recent hospitalization.   Has been compliant with this medication. [name] placed on heparin  as he might have required chest tube insertion. Plan was to resume Eliquis  once chest tube removed. Now [name] has developed Acute blood loss anemia. Heparin  discontinued, hold on resuming eliquis .  Acute blood loss anemia: Hemoglobin dropped from 7.7 > 6.7. There is no any obvious visible bleeding noted. Transfused 2 units PRBC. Follow up H/H    Prolonged QT interval: Hold  QT prolonging agents for now. Hold Abilify , Plaquenil, Effexor and desipramine   Essential hypertension: Continue home meds.   Mixed hyperlipidemia: Continue Crestor , fenofibrate .   GERD: Continue Protonix .   Metastatic prostate cancer: [name] follows with [name], [name] For now we will hold his Xtandi  due to acute infection   Rheumatoid  arthritis: -hold Plaquenil due to prolonged QTc  Hyponatremia: Could be due to above. Start salt tablets 1gm TID Consider nephrology consult if continues to remain low.  DVT prophylaxis:SCDs Code Status: Full code Family Communication: No family at bed side. Disposition Plan:   Status is: Inpatient Remains inpatient appropriate because: Severity of illness.   Consultants:  Pulmonology  Procedures: None  Antimicrobials:  Anti-infectives (From admission, onward)    Start     Dose/Rate Route Frequency Ordered Stop   01/18/24 1000  levofloxacin (LEVAQUIN) tablet 750 mg        750 mg Oral Daily 01/18/24 0854 01/18/24  1021    01/15/24 1000  vancomycin  (VANCOCIN ) IVPB 1000 mg/200 mL premix  Status:  Discontinued        1,000 mg 200 mL/hr over 60 Minutes Intravenous Every 12 hours 01/14/24 2030 01/18/24 0854   01/15/24 0000  aztreonam (AZACTAM) 2 g in sodium chloride  0.9 % 100 mL IVPB  Status:  Discontinued        2 g 200 mL/hr over 30 Minutes Intravenous Every 8 hours 01/14/24 2030 01/18/24 0854   01/14/24 2100  metroNIDAZOLE  (FLAGYL ) IVPB 500 mg  Status:  Discontinued        500 mg 100 mL/hr over 60 Minutes Intravenous Every 12 hours 01/14/24 2001 01/18/24 0854   01/14/24 2100  vancomycin  (VANCOCIN ) IVPB 1000 mg/200 mL premix        1,000 mg 200 mL/hr over 60 Minutes Intravenous  Once 01/14/24 2030 01/15/24 0028   01/14/24 1730  vancomycin  (VANCOCIN ) IVPB 1000 mg/200 mL premix        1,000 mg 200 mL/hr over 60 Minutes Intravenous  Once 01/14/24 1715 01/14/24 1937   01/14/24 1600  doxycycline  (VIBRAMYCIN ) 100 mg in sodium chloride  0.9 % 250 mL IVPB        100 mg 125 mL/hr over 120 Minutes Intravenous  Once 01/14/24 1518 01/14/24 1939   01/14/24 1530  aztreonam (AZACTAM) 2 g in sodium chloride  0.9 % 100 mL IVPB        2 g 200 mL/hr over 30 Minutes Intravenous  Once 01/14/24 1519 01/14/24 1654      Subjective: [name] was seen and examined at bedside. Overnight events noted. [name] was sitting comfortably on the bed.  His blood pressure remains low. Chest tube was removed yesterday.  [name] been receiving blood transfusion.  Objective: Vitals:   01/19/24 0843 01/19/24 1128 01/19/24 1134 01/19/24 1209  BP: (!) 101/56 (!) 85/60 (!) 82/61 98/63  Pulse: (!) 110 (!) 109 (!) 107 (!) 104  Resp: (!) 28 (!) 22 (!) 28 (!) 26  Temp: 97.6 F (36.4 C) 97.6 F (36.4 C) 97.6 F (36.4 C) 97.8 F (36.6 C)  TempSrc: Oral Oral Oral Oral  SpO2: 98% 93% 99% 96%  Weight:      Height:        Intake/Output Summary (Last 24 hours) at 01/19/2024 1232 Last data filed at 01/19/2024 1125 Gross per 24 hour  Intake 1035  ml  Output 1300 ml  Net -265 ml   Filed Weights   01/14/24 1133 01/14/24 2133 01/16/24 2045  Weight: 108.9 kg 108.8 kg 109.5 kg    Examination:  General exam: Appears calm and comfortable, not in any acute distress. Respiratory system: CTA bilaterally. Respiratory effort normal.  RR 14,  No Accessory muscle use. Cardiovascular system: S1 & S2 heard, RRR. No JVD, murmurs, rubs, gallops or clicks. Gastrointestinal system: Abdomen is non distended, soft and non tender.  Normal bowel sounds heard. Central nervous system: Alert and oriented x 3. No focal neurological deficits. Extremities: No edema, no cyanosis, no clubbing. Skin: No rashes, lesions or ulcers Psychiatry: Judgement and insight appear normal. Mood & affect appropriate.     Data Reviewed: I have personally reviewed following labs and imaging studies  CBC: Recent Labs  Lab 01/16/24 0803 01/16/24 2102 01/17/24 0035 01/18/24 0802 01/19/24 0325  WBC 14.4* 18.8* 17.9* 16.0* 14.9*  HGB 9.4* 9.3* 9.0* 7.7* 6.7*  HCT 29.9* 29.7* 28.3* 24.9* 20.3*  MCV 98.0 95.5 95.6 99.6 94.9  PLT 407* 474* 431* 429* 404*  Basic Metabolic Panel: Recent Labs  Lab 01/14/24 1409 01/15/24 5284 01/16/24 0803 01/16/24 2102 01/17/24 0235 01/18/24 0802 01/19/24 0325  NA  --    < > 130* 132* 132* 129* 126*  K  --    < > 4.2 4.3 4.5 4.4 4.4  CL  --    < > 100 98 100 98 96*  CO2  --    < > 21* 19* 21* 22 22  GLUCOSE  --    < > 174* 199* 226* 139* 128*  BUN  --    < > 10 13 16  27* 32*  CREATININE  --    < > 0.73 1.07 1.16 0.99 0.91  CALCIUM   --    < > 8.7* 8.4* 8.5* 8.1* 8.2*  MG 2.1  --  2.0  --   --  2.1 2.1  PHOS  --   --  3.0  --   --  2.4* 2.3*   < > = values in this interval not displayed.   GFR: Estimated Creatinine Clearance: 102.3 mL/min (by C-G formula based on SCr of 0.91 mg/dL). Liver Function Tests: Recent Labs  Lab 01/14/24 1200 01/15/24 1513  AST 28  --   ALT 20  --   ALKPHOS 82  --   BILITOT 0.3  --   PROT  7.5 6.2*  ALBUMIN 2.8*  --    No results for input(s): "LIPASE", "AMYLASE" in the last 168 hours. No results for input(s): "AMMONIA" in the last 168 hours. Coagulation Profile: No results for input(s): "INR", "PROTIME" in the last 168 hours. Cardiac Enzymes: No results for input(s): "CKTOTAL", "CKMB", "CKMBINDEX", "TROPONINI" in the last 168 hours. BNP (last 3 results) No results for input(s): "PROBNP" in the last 8760 hours. HbA1C: Recent Labs    01/17/24 0033  HGBA1C 6.1*   CBG: Recent Labs  Lab 01/18/24 1942 01/18/24 2332 01/19/24 0338 01/19/24 0804 01/19/24 1205  GLUCAP 157* 131* 128* 145* 157*   Lipid Profile: No results for input(s): "CHOL", "HDL", "LDLCALC", "TRIG", "CHOLHDL", "LDLDIRECT" in the last 72 hours. Thyroid  Function Tests: No results for input(s): "TSH", "T4TOTAL", "FREET4", "T3FREE", "THYROIDAB" in the last 72 hours. Anemia Panel: No results for input(s): "VITAMINB12", "FOLATE", "FERRITIN", "TIBC", "IRON", "RETICCTPCT" in the last 72 hours. Sepsis Labs: Recent Labs  Lab 01/16/24 2102 01/16/24 2250 01/17/24 0033 01/17/24 0235 01/17/24 0955  PROCALCITON  --  0.80  --   --   --   LATICACIDVEN 2.9*  --  1.8 2.6* 3.1*    Recent Results (from the past 240 hours)  Resp panel by RT-PCR (RSV, Flu A&B, Covid) Anterior Nasal Swab     Status: None   Collection Time: 01/14/24 12:09 PM   Specimen: Anterior Nasal Swab  Result Value Ref Range Status   SARS Coronavirus 2 by RT PCR NEGATIVE NEGATIVE Final    Comment: (NOTE) SARS-CoV-2 target nucleic acids are NOT DETECTED.  The SARS-CoV-2 RNA is generally detectable in upper respiratory specimens during the acute phase of infection. The lowest concentration of SARS-CoV-2 viral copies this assay can detect is 138 copies/mL. A negative result does not preclude SARS-Cov-2 infection and should not be used as the sole basis for treatment or other [name] management decisions. A negative result may occur with   improper specimen collection/handling, submission of specimen other than nasopharyngeal swab, presence of viral mutation(s) within the areas targeted by this assay, and inadequate number of viral copies(<138 copies/mL). A negative result must be combined with  clinical observations, [name] history, and epidemiological information. The expected result is Negative.  Fact Sheet for Patients:  BloggerCourse.com  Fact Sheet for Healthcare Providers:  SeriousBroker.it  This test is no t yet approved or cleared by the [place]  FDA and  has been authorized for detection and/or diagnosis of SARS-CoV-2 by FDA under an Emergency Use Authorization (EUA). This EUA will remain  in effect (meaning this test can be used) for the duration of the COVID-19 declaration under Section 564(b)(1) of the Act, 21 U.S.C.section 360bbb-3(b)(1), unless the authorization is terminated  or revoked sooner.       Influenza A by PCR NEGATIVE NEGATIVE Final   Influenza B by PCR NEGATIVE NEGATIVE Final    Comment: (NOTE) The Xpert Xpress SARS-CoV-2/FLU/RSV plus assay is intended as an aid in the diagnosis of influenza from Nasopharyngeal swab specimens and should not be used as a sole basis for treatment. Nasal washings and aspirates are unacceptable for Xpert Xpress SARS-CoV-2/FLU/RSV testing.  Fact Sheet for Patients: BloggerCourse.com  Fact Sheet for Healthcare Providers: SeriousBroker.it  This test is not yet approved or cleared by the [place]  FDA and has been authorized for detection and/or diagnosis of SARS-CoV-2 by FDA under an Emergency Use Authorization (EUA). This EUA will remain in effect (meaning this test can be used) for the duration of the COVID-19 declaration under Section 564(b)(1) of the Act, 21 U.S.C. section 360bbb-3(b)(1), unless the authorization is terminated or revoked.      Resp Syncytial Virus by PCR NEGATIVE NEGATIVE Final    Comment: (NOTE) Fact Sheet for Patients: BloggerCourse.com  Fact Sheet for Healthcare Providers: SeriousBroker.it  This test is not yet approved or cleared by the [place]  FDA and has been authorized for detection and/or diagnosis of SARS-CoV-2 by FDA under an Emergency Use Authorization (EUA). This EUA will remain in effect (meaning this test can be used) for the duration of the COVID-19 declaration under Section 564(b)(1) of the Act, 21 U.S.C. section 360bbb-3(b)(1), unless the authorization is terminated or revoked.  Performed at [facility], [address], [place], [place] [address]   Culture, blood (routine x 2)     Status: None   Collection Time: 01/14/24  5:00 PM   Specimen: BLOOD  Result Value Ref Range Status   Specimen Description BLOOD BLOOD RIGHT HAND  Final   Special Requests   Final    BOTTLES DRAWN AEROBIC AND ANAEROBIC Blood Culture adequate volume   Culture   Final    NO GROWTH 5 DAYS Performed at [facility], [address] [place], [place] [address]    Report Status 01/19/2024 FINAL  Final  Culture, blood (routine x 2)     Status: None   Collection Time: 01/14/24  5:47 PM   Specimen: BLOOD  Result Value Ref Range Status   Specimen Description BLOOD RIGHT ANTECUBITAL  Final   Special Requests   Final    BOTTLES DRAWN AEROBIC ONLY Blood Culture adequate volume   Culture   Final    NO GROWTH 5 DAYS Performed at [facility], [address], [place], [place] [address]    Report Status 01/19/2024 FINAL  Final  MRSA Next Gen by PCR, Nasal     Status: None   Collection Time: 01/15/24 12:12 AM   Specimen: Urine, Clean Catch; Nasal Swab  Result Value Ref Range Status   MRSA by PCR Next Gen NOT DETECTED NOT DETECTED Final    Comment: (NOTE) The GeneXpert MRSA Assay (FDA approved for  NASAL specimens only), is one component of a comprehensive MRSA  colonization surveillance program. It is not intended to diagnose MRSA infection nor to guide or monitor treatment for MRSA infections. Test performance is not FDA approved in patients less than 33 years old. Performed at [facility], [address], [place], [place] [address]   Body fluid culture w Gram Stain     Status: None   Collection Time: 01/15/24  2:06 PM   Specimen: Pleural Fluid  Result Value Ref Range Status   Specimen Description PLEURAL  Final   Special Requests left  Final   Gram Stain NO WBC SEEN NO ORGANISMS SEEN   Final   Culture   Final    NO GROWTH 3 DAYS Performed at [facility], [address], [place], [place] [address]    Report Status 01/18/2024 FINAL  Final  Culture, blood (Routine X 2) w Reflex to ID Panel     Status: None (Preliminary result)   Collection Time: 01/16/24 10:50 PM   Specimen: BLOOD RIGHT ARM  Result Value Ref Range Status   Specimen Description BLOOD RIGHT ARM  Final   Special Requests   Final    BOTTLES DRAWN AEROBIC AND ANAEROBIC Blood Culture adequate volume   Culture   Final    NO GROWTH 3 DAYS Performed at [facility], [address], [place], [place] [address]    Report Status PENDING  Incomplete  Culture, blood (Routine X 2) w Reflex to ID Panel     Status: None (Preliminary result)   Collection Time: 01/16/24 10:50 PM   Specimen: BLOOD LEFT HAND  Result Value Ref Range Status   Specimen Description BLOOD LEFT HAND  Final   Special Requests   Final    BOTTLES DRAWN AEROBIC AND ANAEROBIC Blood Culture results may not be optimal due to an inadequate volume of blood received in culture bottles   Culture   Final    NO GROWTH 3 DAYS Performed at [facility], [address] [place], [place] [address]    Report Status PENDING  Incomplete    Radiology Studies: CT CHEST WO CONTRAST Result Date: 01/18/2024 CLINICAL DATA:  Pleural effusion, new no suspected, chest 2 EXAM: CT CHEST WITHOUT CONTRAST  TECHNIQUE: Multidetector CT imaging of the chest was performed following the standard protocol without IV contrast. RADIATION DOSE REDUCTION: This exam was performed according to the departmental dose-optimization program which includes automated exposure control, adjustment of the mA and/or kV according to [name] size and/or use of iterative reconstruction technique. COMPARISON:  Three days ago FINDINGS: Cardiovascular: Normal heart sounds. Small volume pericardial fluid which is similar to prior. No acute vascular finding. Scattered atheromatous calcification. Mediastinum/Nodes: Unchanged moderate enlargement of lymph nodes in the prevascular and central mediastinum. Unchanged reticulation of mediastinal fat the lower heart. Lungs/Pleura: Significant decrease in left pleural effusion with mild residual seen the lower major fissure. There is question of nodular thickening along the left pleura, but varies measuring water  density with no activity on recent PET CT in these areas, can correlate with pleural fluid labs. Small bore catheter is in good position at the left posterior pleural cavity. Reticulation in the bilateral lungs (patchy ground-glass opacity on recent imaging) and paramediastinal consolidative type opacity in the left upper lobe. There is a degree chronic lung disease with paraseptal emphysema, although fairly well aerated lungs at PET CT 09/07/2023. Upper Abdomen: Negative Musculoskeletal: No acute finding IMPRESSION: Nearly resolved  left pleural effusion with chest tube remaining in place at the lateral left base. Nodular appearance along the left pleural surface has no correlate on recent PET CT, correlate with pleural fluid labs. No progression pulmonary opacity and mediastinal adenopathy-infection, malignancy, and drug reaction differential considerations based on recent CTs. Electronically Signed   By: Ronnette Coke M.D.   On: 01/18/2024 04:13   Scheduled Meds:  sodium chloride     Intravenous Once   Chlorhexidine  Gluconate Cloth  6 each Topical q morning   feeding supplement  237 mL Oral BID BM   ferrous sulfate  325 mg Oral Daily   furosemide  20 mg Oral Daily   insulin aspart  0-15 Units Subcutaneous Q4H   mouth rinse  15 mL Mouth Rinse 4 times per day   pantoprazole   40 mg Oral QAC breakfast   rosuvastatin   40 mg Oral Daily   sodium chloride  flush  10 mL Intrapleural Q8H   Continuous Infusions:     LOS: 5 days    Time spent: 50 mins    Magdalene School, MD [facility]   If 7PM-7AM, please contact night-coverage

## 2024-01-19 NOTE — Progress Notes (Signed)
 [name]   DOB:13-Jan-1956   ZO#:[id number]    ASSESSMENT & PLAN:  69 y.o.male with past medical history of hypertension, hyperlipidemia, GERD, metastatic CRCP, PE admitted for loculated pleural effusion and sepsis.   Chest tube removed. CT showed improvement and clinically he is feeling better. Hgb dropped to <7 today. Received pRBC. Cytology pending. Cultures negative to date. Assessment & Plan Loculated pleural effusion Met sepsis criteria on admission.  Agree with empiric antibiotics with clinical improvement Follow up cytology Prostate cancer metastatic to bone [facility]) Holding enza on admission, may resume once discharge home from acute process. History of pulmonary embolism Agree with holding anticoagulation When ready to be discharged, may consider apixaban  2.5 mg twice daily if no signs of bleeding.  Other chronic medical management per primary.  All questions were answered. Will follow up as outpatient.   Michael Ruddy, MD 01/19/2024 5:02 PM  Subjective:  [name] reports feeling better. He denies more short of breath, cough. No bleeding.   Objective:  Vitals:   01/19/24 1500 01/19/24 1551  BP: (!) 80/49 100/69  Pulse: (!) 105   Resp: (!) 28   Temp: 98.1 F (36.7 C) 97.6 F (36.4 C)  SpO2: 100%      Intake/Output Summary (Last 24 hours) at 01/19/2024 1702 Last data filed at 01/19/2024 1557 Gross per 24 hour  Intake 1165 ml  Output 2250 ml  Net -1085 ml    GENERAL: alert, no distress and comfortable SKIN: skin color normal. No signs of bleeding or hematoma around chest wall. LUNGS: normal breathing effort.    Labs:  Recent Labs    12/01/23 1917 12/01/23 2104 12/17/23 0903 12/23/23 1515 01/14/24 1200 01/15/24 0842 01/15/24 1513 01/16/24 0803 01/17/24 0235 01/18/24 0802 01/19/24 0325  NA 138   < > 135   < > 129*   < >  --    < > 132* 129* 126*  K 4.2   < > 4.3   < > 4.1   < >  --    < > 4.5 4.4 4.4  CL 102   < > 99   < > 96*   < >  --    < > 100  98 96*  CO2 22   < > 29   < > 20*   < >  --    < > 21* 22 22  GLUCOSE 102*   < > 107*   < > 134*   < >  --    < > 226* 139* 128*  BUN 17   < > 13   < > 14   < >  --    < > 16 27* 32*  CREATININE 0.70   < > 0.80   < > 0.90   < >  --    < > 1.16 0.99 0.91  CALCIUM  9.5   < > 9.9   < > 8.9   < >  --    < > 8.5* 8.1* 8.2*  GFRNONAA >60   < > >60  --  >60   < >  --    < > >60 >60 >60  PROT 7.8  --  7.7  --  7.5  --  6.2*  --   --   --   --   ALBUMIN 3.5  --  4.1  --  2.8*  --   --   --   --   --   --  AST 24  --  19  --  28  --   --   --   --   --   --   ALT 13  --  10  --  20  --   --   --   --   --   --   ALKPHOS 60  --  60  --  82  --   --   --   --   --   --   BILITOT 0.5  --  0.4  --  0.3  --   --   --   --   --   --   BILIDIR  --   --   --   --  0.1  --   --   --   --   --   --   IBILI  --   --   --   --  0.2*  --   --   --   --   --   --    < > = values in this interval not displayed.    Studies:  CT CHEST WO CONTRAST Result Date: 01/18/2024 CLINICAL DATA:  Pleural effusion, new no suspected, chest 2 EXAM: CT CHEST WITHOUT CONTRAST TECHNIQUE: Multidetector CT imaging of the chest was performed following the standard protocol without IV contrast. RADIATION DOSE REDUCTION: This exam was performed according to the departmental dose-optimization program which includes automated exposure control, adjustment of the mA and/or kV according to patient size and/or use of iterative reconstruction technique. COMPARISON:  Three days ago FINDINGS: Cardiovascular: Normal heart sounds. Small volume pericardial fluid which is similar to prior. No acute vascular finding. Scattered atheromatous calcification. Mediastinum/Nodes: Unchanged moderate enlargement of lymph nodes in the prevascular and central mediastinum. Unchanged reticulation of mediastinal fat the lower heart. Lungs/Pleura: Significant decrease in left pleural effusion with mild residual seen the lower major fissure. There is question of nodular  thickening along the left pleura, but varies measuring water  density with no activity on recent PET CT in these areas, can correlate with pleural fluid labs. Small bore catheter is in good position at the left posterior pleural cavity. Reticulation in the bilateral lungs (patchy ground-glass opacity on recent imaging) and paramediastinal consolidative type opacity in the left upper lobe. There is a degree chronic lung disease with paraseptal emphysema, although fairly well aerated lungs at PET CT 09/07/2023. Upper Abdomen: Negative Musculoskeletal: No acute finding IMPRESSION: Nearly resolved left pleural effusion with chest tube remaining in place at the lateral left base. Nodular appearance along the left pleural surface has no correlate on recent PET CT, correlate with pleural fluid labs. No progression pulmonary opacity and mediastinal adenopathy-infection, malignancy, and drug reaction differential considerations based on recent CTs. Electronically Signed   By: Ronnette Coke M.D.   On: 01/18/2024 04:13   DG Chest Port 1 View Result Date: 01/17/2024 CLINICAL DATA:  Acute respiratory failure. EXAM: PORTABLE CHEST 1 VIEW COMPARISON:  01/16/2024 FINDINGS: There is a left-sided pigtail drainage catheter overlying the left lower lung. The left-sided pleural fluid collection is decreased from previous exam. Chronic, bilateral reticular interstitial opacities with a lower lung zone predominance. No significant pneumothorax identified. IMPRESSION: 1. Left-sided pigtail drainage catheter overlying the left lower lung. Decreased left pleural fluid collection. No pneumothorax. 2. Chronic interstitial changes as noted previously. Electronically Signed   By: Kimberley Penman M.D.   On: 01/17/2024 08:49   DG Chest Port 1 View Result Date:  01/16/2024 CLINICAL DATA:  Acute respiratory distress EXAM: PORTABLE CHEST 1 VIEW COMPARISON:  X-ray earlier 01/16/2024 and older. FINDINGS: Film is rotated to left. Left basilar  pigtail catheter is again seen and has slightly retracted compared to the previous x-ray. Persistent left-sided effusion adjacent lung opacity. Masslike fullness of the left lung hilum again seen. Please correlate with prior CTA of the chest. Stable cardiopericardial silhouette. Underinflation with some right basilar opacity again seen. No pneumothorax. Overlapping cardiac leads. Fixation hardware along the lower cervical spine. IMPRESSION: Interval retraction of the pigtail catheter at the left lung base. No pneumothorax. No significant oval change when adjusted for technique otherwise. Electronically Signed   By: Adrianna Horde M.D.   On: 01/16/2024 19:55   DG Chest Port 1 View Result Date: 01/16/2024 CLINICAL DATA:  Chest tube EXAM: PORTABLE CHEST 1 VIEW COMPARISON:  Chest x-ray performed Jan 15, 2024 FINDINGS: A left basilar thoracostomy is present. Persistent opacification of the left lung base. Mild diffuse interstitial prominence. Heart mediastinum are not significantly changed. No pneumothorax. IMPRESSION: 1. Left-sided thoracostomy tube without evidence of pneumothorax. 2. When compared to the prior exam, similar appearance of left basilar consolidation and pleural effusion. 3. No significant interval change. Electronically Signed   By: Reagan Camera M.D.   On: 01/16/2024 08:39   DG CHEST PORT 1 VIEW Result Date: 01/15/2024 CLINICAL DATA:  Left chest tube EXAM: PORTABLE CHEST 1 VIEW COMPARISON:  01/14/2024 FINDINGS: Single frontal view of the chest demonstrates a stable cardiac silhouette. Interval placement of a left chest tube, overlying the medial left lung base. Marked decrease in the multilocular left pleural effusion seen previously. Persistent areas of consolidation are seen within the left perihilar region, most pronounced in the left upper lobe. Stable emphysema. No pneumothorax. No acute bony abnormalities. IMPRESSION: 1. Marked decrease in the multilocular left pleural effusion after left  chest tube placement. 2. Persistent left perihilar consolidation, greatest in the left upper lobe, consistent with pneumonia or atelectasis. Electronically Signed   By: Bobbye Burrow M.D.   On: 01/15/2024 16:18   CT Angio Chest PE W/Cm &/Or Wo Cm Result Date: 01/14/2024 CLINICAL DATA:  Pulmonary embolism EXAM: CT ANGIOGRAPHY CHEST WITH CONTRAST TECHNIQUE: Multidetector CT imaging of the chest was performed using the standard protocol during bolus administration of intravenous contrast. Multiplanar CT image reconstructions and MIPs were obtained to evaluate the vascular anatomy. Multiplanar image (3D post-processing) reconstructions and MIPs were obtained to evaluate the vascular anatomy. RADIATION DOSE REDUCTION: This exam was performed according to the departmental dose-optimization program which includes automated exposure control, adjustment of the mA and/or kV according to patient size and/or use of iterative reconstruction technique. CONTRAST:  75mL OMNIPAQUE  IOHEXOL  350 MG/ML SOLN COMPARISON:  CT of the chest performed December 01, 2023. PET-CT performed Dec 31, 2023 FINDINGS: Cardiovascular: Normal size heart. No pericardial effusion. Three vessel aortic arch. The main pulmonary artery is within normal limits for size. No evidence of pulmonary embolism to the proximal segmental level. Mediastinum/Nodes: Enlarged lymph nodes are present within the mediastinum. A representative lymph node anterior to the thoracic aorta measures 1.4 cm in short axis dimension on image 37 of series 4. Soft tissue thickening is also increased along the left medial pleural surface, left mediastinum and left hilum. Lungs/Pleura: There is a loculated left-sided pleural effusion. This is moderate in size and worst in the left lung base. Consolidative changes are present in the left lung particularly in the medial left upper lobe and left lower lobe.  Underlying changes of chronic lung disease are present with cystic lucencies which are  upper lobe and peripheral predominant. Peribronchial thickening is also noted. Upper Abdomen: No acute abnormality. Musculoskeletal: No chest wall abnormality. No acute or significant osseous findings. Review of the MIP images confirms the above findings. IMPRESSION: 1. Increase in abnormal soft tissue thickening along the medial pleural surface of the left lung. 2. Moderate loculated left pleural effusion. 3. Increasing mediastinal lymphadenopathy. 4. No evidence of pulmonary embolism to the proximal segmental level. 5. Scattered consolidative changes are also present in the left lung which may represent superimposed infection. Electronically Signed   By: Reagan Camera M.D.   On: 01/14/2024 16:56   DG Chest Port 1 View Result Date: 01/14/2024 CLINICAL DATA:  Shortness of breath, dizziness. EXAM: PORTABLE CHEST 1 VIEW COMPARISON:  December 01, 2023. FINDINGS: Stable cardiomediastinal silhouette. Stable minimal right basilar scarring or subsegmental atelectasis. Increased left basilar opacity is noted concerning for worsening atelectasis or pneumonia with small left pleural effusion. Bony thorax is unremarkable. IMPRESSION: Increased left basilar opacity is noted concerning for worsening atelectasis or pneumonia with small left pleural effusion. Electronically Signed   By: Rosalene Colon M.D.   On: 01/14/2024 13:56   NM PET (PSMA) SKULL TO MID THIGH Result Date: 12/31/2023 CLINICAL DATA:  Prostate carcinoma with biochemical recurrence. PSA equal 6.8 EXAM: NUCLEAR MEDICINE PET SKULL BASE TO THIGH TECHNIQUE: 8.2 mCi Flotufolastat (Posluma ) was injected intravenously. Full-ring PET imaging was performed from the skull base to thigh after the radiotracer. CT data was obtained and used for attenuation correction and anatomic localization. COMPARISON:  PSMA PET scan 09/07/2023 FINDINGS: NECK No radiotracer activity in neck lymph nodes. Incidental CT finding: None. CHEST New perihilar consolidation in the LEFT upper lobe.  New LEFT pleural effusion. Paraseptal emphysema in the medial LEFT upper lobe is also increased. These findings have progressed rapidly from CT January. New enlarged prevascular lymph nodes. For example 15 mm node image 70/4 with SUV max equal 9.6. Consolidation and peribronchial thickening in the medial LEFT upper lobe has radiotracer activity SUV max equal 7.8. Small RIGHT supraclavicular node with SUV max equal 5.0 on image 51. Incidental CT finding: None. ABDOMEN/PELVIS Prostate: No focal activity in the prostate bed. Lymph nodes: No radiotracer avid pelvic adenopathy. Periaortic retroperitoneal lymph nodes on the LEFT are decreased in activity. For example node LEFT of the aorta on image 135 with SUV max equal 8.9 compared SUV max equal 31.4. Node measures 8 mm compared to 11 mm. Intense radiotracer activity associated with a node dorsal to the pancreatic head with SUV max equal 26.5 (image 113) decreased from SUV max equal 42. This node measures 11 mm short axis compared to 11 mm for no change. No new adenopathy. Liver: No evidence of liver metastasis. Incidental CT finding: None. SKELETON Lesion in the RIGHT acetabulum with SUV max equal 13.8 compared SUV max equal 9.3 on image 192. New skeletal lesion at the skull base on the LEFT with SUV max equal 21.6 on image 14 just anterior to the external auditory canal. IMPRESSION: 1. New perihilar consolidation in the LEFT upper lobe with radiotracer activity. New radiotracer avid prevascular lymph nodes. New pleural effusion. Differential include unusual pattern for metastatic prostate carcinoma versus nonspecific uptake in reactive adenopathy associated pneumonia or drug reaction. Unusual progression to paraseptal emphysema. Recommend pulmonology consultation. 2. 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 Stable lesion in the RIGHT acetabulum 3. Decreased radiotracer activity  in periaortic retroperitoneal lymph nodes. 4. No evidence of  local prostate carcinoma recurrence in the prostate bed. 5. No evidence of liver metastasis. These results will be called to the ordering clinician or representative by the Radiologist Assistant, and communication documented in the PACS or [employer]. Electronically Signed   By: Deboraha Fallow M.D.   On: 12/31/2023 16:44

## 2024-01-19 NOTE — Progress Notes (Signed)
 Date and time results received: 01/19/24 0357 (use smartphrase ".now" to insert current time)  Test: Hemoglobin Critical Value: 6.7  Name of Provider Notified: Subrina Sundil,MD  Orders Received.

## 2024-01-19 NOTE — Assessment & Plan Note (Signed)
 Holding enza on admission, may resume once discharge home from acute process.

## 2024-01-19 NOTE — Progress Notes (Signed)
Off the floor for CT 

## 2024-01-19 NOTE — Assessment & Plan Note (Signed)
 Agree with holding anticoagulation When ready to be discharged, may consider apixaban  2.5 mg twice daily if no signs of bleeding.

## 2024-01-19 NOTE — Progress Notes (Addendum)
 Phlebotomist struggling to draw blood sample for type and screen. Subrina Sundil, MD notified.  Sample drawn. MD notified.

## 2024-01-19 NOTE — Plan of Care (Signed)
  Problem: Clinical Measurements: Goal: Diagnostic test results will improve Outcome: Progressing   Problem: Clinical Measurements: Goal: Will remain free from infection Outcome: Progressing   Problem: Clinical Measurements: Goal: Respiratory complications will improve Outcome: Progressing   Problem: Nutrition: Goal: Adequate nutrition will be maintained Outcome: Progressing   Problem: Activity: Goal: Risk for activity intolerance will decrease Outcome: Progressing   Problem: Coping: Goal: Level of anxiety will decrease Outcome: Progressing   Problem: Pain Managment: Goal: General experience of comfort will improve and/or be controlled Outcome: Progressing   Problem: Safety: Goal: Ability to remain free from injury will improve Outcome: Progressing   Problem: Skin Integrity: Goal: Risk for impaired skin integrity will decrease Outcome: Progressing

## 2024-01-20 ENCOUNTER — Encounter: Payer: Self-pay | Admitting: Gastroenterology

## 2024-01-20 DIAGNOSIS — C61 Malignant neoplasm of prostate: Secondary | ICD-10-CM | POA: Diagnosis not present

## 2024-01-20 DIAGNOSIS — J90 Pleural effusion, not elsewhere classified: Secondary | ICD-10-CM | POA: Diagnosis not present

## 2024-01-20 DIAGNOSIS — D62 Acute posthemorrhagic anemia: Secondary | ICD-10-CM | POA: Diagnosis not present

## 2024-01-20 LAB — GLUCOSE, CAPILLARY
Glucose-Capillary: 119 mg/dL — ABNORMAL HIGH (ref 70–99)
Glucose-Capillary: 120 mg/dL — ABNORMAL HIGH (ref 70–99)
Glucose-Capillary: 142 mg/dL — ABNORMAL HIGH (ref 70–99)
Glucose-Capillary: 158 mg/dL — ABNORMAL HIGH (ref 70–99)
Glucose-Capillary: 186 mg/dL — ABNORMAL HIGH (ref 70–99)
Glucose-Capillary: 204 mg/dL — ABNORMAL HIGH (ref 70–99)

## 2024-01-20 LAB — CBC
HCT: 25.9 % — ABNORMAL LOW (ref 39.0–52.0)
Hemoglobin: 8.4 g/dL — ABNORMAL LOW (ref 13.0–17.0)
MCH: 29.9 pg (ref 26.0–34.0)
MCHC: 32.4 g/dL (ref 30.0–36.0)
MCV: 92.2 fL (ref 80.0–100.0)
Platelets: 364 10*3/uL (ref 150–400)
RBC: 2.81 MIL/uL — ABNORMAL LOW (ref 4.22–5.81)
RDW: 16.5 % — ABNORMAL HIGH (ref 11.5–15.5)
WBC: 14.9 10*3/uL — ABNORMAL HIGH (ref 4.0–10.5)
nRBC: 0.5 % — ABNORMAL HIGH (ref 0.0–0.2)

## 2024-01-20 LAB — BPAM RBC
Blood Product Expiration Date: 202506212359
Blood Product Expiration Date: 202506212359
ISSUE DATE / TIME: 202505270821
ISSUE DATE / TIME: 202505271145
Unit Type and Rh: 6200
Unit Type and Rh: 6200

## 2024-01-20 LAB — TYPE AND SCREEN
ABO/RH(D): A POS
Antibody Screen: NEGATIVE
Unit division: 0
Unit division: 0

## 2024-01-20 NOTE — Progress Notes (Signed)
 Physical Therapy Treatment Patient Details [name] MRN: [id number] DOB: Jun 23, 1956 Today's Date: 01/20/2024   History of Present Illness Pt is a 68 y/o male admitted 01/12/24 with SOB, fatigue, dizziness and found to have L lung base consolidation with loculated effusion.  He underwent chest tube insertion on 01/15/24 with image guidance.  PMH positive for HTN, GERD, metastatic prostate CA, recent PE on Eliquis , GERD, Sleep apnea on CPAP, previous lumbar and cervical spinal surgeries.    PT Comments  Pt resting in bed on arrival, pleasant and agreeable to session with steady progress towards acute goals. Pt continues to be limited by decreased activity tolerance and decreased cardiopulmonary endurance, require up to 6L O2 to maintain Spo2 >90% during activity this session. Pt requiring grossly CGA for transfers and gait with RW, however pt with x1 posterior LOB needing min A external assist to correct. Pt up in chair at end of session and verbalizing understanding of continued RW use to maximize safety and reduce risk of falls. Pt continues to benefit from skilled PT services to progress toward functional mobility goals.     If plan is discharge home, recommend the following: A little help with walking and/or transfers;Help with stairs or ramp for entrance;Assist for transportation   Can travel by private vehicle        Equipment Recommendations  None recommended by PT    Recommendations for Other Services       Precautions / Restrictions Precautions Precautions: Fall Precaution/Restrictions Comments: watch SpO2 Restrictions Weight Bearing Restrictions Per Provider Order: No     Mobility  Bed Mobility Overal bed mobility: Modified Independent             General bed mobility comments: slighty increased time    Transfers Overall transfer level: Needs assistance Equipment used: Rolling walker (2 wheels) Transfers: Sit to/from Stand Sit to Stand: Supervision            General transfer comment: assist for lines    Ambulation/Gait Ambulation/Gait assistance: Min assist, Contact guard assist Gait Distance (Feet): 130 Feet Assistive device: Rolling walker (2 wheels) Gait Pattern/deviations: Step-through pattern, Decreased stride length, Trunk flexed, Wide base of support Gait velocity: decr     General Gait Details: at times walker getting too far anterior to pt, cues for safety and upright trunk, light min A to steady as pt with x1 posterior LOB needing assist to correct, x2 standing rest breaks with pt resting forearms on RW, SpO2 dropping to 81% on RA, requriing 6L to improved to 92% with activity   Stairs             Wheelchair Mobility     Tilt Bed    Modified Rankin (Stroke Patients Only)       Balance Overall balance assessment: Needs assistance   Sitting balance-Leahy Scale: Good     Standing balance support: Bilateral upper extremity supported Standing balance-Leahy Scale: Poor Standing balance comment: UE reliant due to flexed posture                            Communication Communication Communication: No apparent difficulties  Cognition Arousal: Alert Behavior During Therapy: WFL for tasks assessed/performed   PT - Cognitive impairments: No apparent impairments                         Following commands: Intact      Cueing Cueing Techniques:  Verbal cues  Exercises      General Comments General comments (skin integrity, edema, etc.): SpO2 98% on 4L on arrival, 92% on RA at rest, decreasing to 81% on RA with activity, rewquiring 6L to recover to 92%, replaced 4L at end of session with Spo2 95% at end of session      Pertinent Vitals/Pain Pain Assessment Pain Assessment: No/denies pain Pain Intervention(s): Monitored during session    Home Living                          Prior Function            PT Goals (current goals can now be found in the care plan  section) Acute Rehab PT Goals Patient Stated Goal: return to independent PT Goal Formulation: With patient/family Time For Goal Achievement: 01/29/24 Progress towards PT goals: Progressing toward goals    Frequency    Min 2X/week      PT Plan      Co-evaluation              AM-PAC PT "6 Clicks" Mobility   Outcome Measure  Help needed turning from your back to your side while in a flat bed without using bedrails?: A Little Help needed moving from lying on your back to sitting on the side of a flat bed without using bedrails?: A Little Help needed moving to and from a bed to a chair (including a wheelchair)?: A Little Help needed standing up from a chair using your arms (e.g., wheelchair or bedside chair)?: A Little Help needed to walk in hospital room?: A Little Help needed climbing 3-5 steps with a railing? : A Lot 6 Click Score: 17    End of Session Equipment Utilized During Treatment: Oxygen;Gait belt Activity Tolerance: Patient limited by fatigue Patient left: with call bell/phone within reach;in chair Nurse Communication: Mobility status;Other (comment) (SpO2 levels) PT Visit Diagnosis: Muscle weakness (generalized) (M62.81);Difficulty in walking, not elsewhere classified (R26.2)     Time: 5284-1324 PT Time Calculation (min) (ACUTE ONLY): 18 min  Charges:    $Gait Training: 8-22 mins PT General Charges $$ ACUTE PT VISIT: 1 Visit                     Quasim Doyon R. PTA Acute Rehabilitation Services Office: [phone]   Agapito Horseman 01/20/2024, 12:15 PM

## 2024-01-20 NOTE — Progress Notes (Signed)
 eLink Physician-Brief Progress Note Patient [name] DOB: 14-Nov-1955 MRN: [id number]   Date of Service  01/20/2024  HPI/Events of Note  e[facility]inc hand off to follow CT chest- for hemothorax, post pleural effusion lytics therapy. Hg from AM > 8 is stable.  CT: IMPRESSION: 1. Small partially loculated left pleural effusion, not significantly changed in size after removal of pleural catheter. Fluid appears to be simple in density. No convincing pleural enhancement. Recommend correlation with fluid sampling. 2. Left upper lobe paramediastinal consolidation and ground-glass, without significant change from CT 2 days ago, but mild progression from PET earlier this month. Differential considerations remain infection, neoplasm or drug reaction. 3. Stable mediastinal and left hilar adenopathy.  eICU Interventions  Continue care  Keep Hg > 7, transfuse if < 7.      Intervention Category Intermediate Interventions: Other:  Rexann Catalan 01/20/2024, 8:36 PM

## 2024-01-20 NOTE — Progress Notes (Signed)
 PROGRESS NOTE    [name]  WUJ:[id number] DOB: December 17, 1955 DOA: 01/14/2024 PCP: Michael Parkins, MD   Brief Narrative:  This 68 yrs old male with PMH significant for hypertension, hyperlipidemia, GERD, metastatic prostate cancer followed by Dr. Alita [name], on enzalutamide  and sipuleucel-T , recent hospitalization where he was diagnosed with PE, started on Eliquis , presents to ED secondary to shortness of breath, fatigue, dizziness, lightheadedness, worsening dyspnea over the last month. In ED lactic acid at 2.0, white blood cell count at 11.8, CTA chest obtained, no evidence of PE, but significant for left lung base consolidation with significant loculated pleural effusion,.  Patient was admitted for further evaluation.  Pulmonology is cosulted and chest tube was placed. Pleural fluid was sent for cytology. It has returned as a malignant effusion that is consistent with metastatic from the patient's known prostate CA.   The patient continues to require 4L O2 in order to maintain saturations in the 90's.  Awaiting plans from hematology oncology/PCCM for the patient's loculated effusions.   Assessment & Plan:   Principal Problem:   Loculated pleural effusion Active Problems:   Essential hypertension   Obesity (BMI 30-39.9)   COPD (chronic obstructive pulmonary disease) with chronic bronchitis (HCC)   GERD (gastroesophageal reflux disease)   Type 2 diabetes mellitus with obesity (HCC)   Prostate cancer metastatic to bone (HCC)   Pulmonary embolism (HCC)   Pleural effusion, left   History of pulmonary embolism   Loculated left pleural effusion: Status post intrapleural fibrinolytics with hemothorax: Sepsis due to HCAP: Patient presented with sepsis criteria (elevated lactic acid, leukocytosis, tachypnea and tachycardia) There is high concern for aspiration, his wife reports significant coughing / choking when eating.  Continue on dysphagia diet, Speech and swallow evaluation recommended  same. Initiated on empiric antibiotics ( Vancomycin , Azactam  and Flagyl  ) Encourage use of  incentive spirometry and flutter valve. Pulmonology consulted , Patient underwent successful chest tube insertion. Maintain chest tube to suction,  awaiting cytology. Continue heparin .  Continue supplemental oxygen. Status post intrapleural fibrinolytic therapy,  Patient has developed high output in the chest tube concerning for hemothorax.  Patient became hypotensive,  tachycardic and was transferred to ICU. Now the chest tube output has reduced,  no further into pleural fibrinolytics for now. Chest for successfully removed 01/18/24. Pulmonology recommended outpatient antibiotics for 5 days based on cultures. Remainder of pleural effusion not amenable to bedside drainage.  Malignanct Pleural Effusion Pleural fluid was sent for cytology. It has returned as a malignant effusion that is consistent with metastatic from the patient's known prostate CA.   Pulmonary embolism: Diagnosed with PE and started on Eliquis  upon recent hospitalization.   Has been compliant with this medication. Patient placed on heparin  as he has required chest tube insertion. Will discuss choice of anticoagulation with hematology given that the patient developed PE while on Eliquis .  Acute blood loss anemia: Hemoglobin dropped from 7.7 > 6.7 on 01/19/2024. There is no any obvious visible bleeding noted. Transfused 2 units PRBC. Hemoglobin 8.4 on the morning of 01/20/2024. Monitor Hgb.   Prolonged QT interval: Hold  QT prolonging agents for now. Hold Abilify , Plaquenil, Effexor and desipramine   Essential hypertension: Continue home meds.   Mixed hyperlipidemia: Continue Crestor , fenofibrate .   GERD: Continue Protonix .   Metastatic prostate cancer: Patient follows with Dr. Alita [name], [name] For now we will hold his Xtandi  due to acute infection   Rheumatoid  arthritis: -hold Plaquenil due to prolonged  QTc  Hyponatremia: Likely SAIDH due  to malignant pleural effusion. Will start fluid restriction. Start salt tablets 1gm TID Consider nephrology consult if continues to remain low.  DVT prophylaxis:SCDs Code Status: Full code Family Communication: No family at bed side. Disposition Plan:   Status is: Inpatient Remains inpatient appropriate because: Severity of illness.   Consultants:  Pulmonology Hematology/oncology  Procedures: Chest tube insertion with instillation of thrombolytics. Chest tube removal.  Antimicrobials:  Anti-infectives (From admission, onward)    Start     Dose/Rate Route Frequency Ordered Stop   01/18/24 1000  levofloxacin (LEVAQUIN) tablet 750 mg        750 mg Oral Daily 01/18/24 0854 01/18/24 1021   01/15/24 1000  vancomycin  (VANCOCIN ) IVPB 1000 mg/200 mL premix  Status:  Discontinued        1,000 mg 200 mL/hr over 60 Minutes Intravenous Every 12 hours 01/14/24 2030 01/18/24 0854   01/15/24 0000  aztreonam (AZACTAM) 2 g in sodium chloride  0.9 % 100 mL IVPB  Status:  Discontinued        2 g 200 mL/hr over 30 Minutes Intravenous Every 8 hours 01/14/24 2030 01/18/24 0854   01/14/24 2100  metroNIDAZOLE  (FLAGYL ) IVPB 500 mg  Status:  Discontinued        500 mg 100 mL/hr over 60 Minutes Intravenous Every 12 hours 01/14/24 2001 01/18/24 0854   01/14/24 2100  vancomycin  (VANCOCIN ) IVPB 1000 mg/200 mL premix        1,000 mg 200 mL/hr over 60 Minutes Intravenous  Once 01/14/24 2030 01/15/24 0028   01/14/24 1730  vancomycin  (VANCOCIN ) IVPB 1000 mg/200 mL premix        1,000 mg 200 mL/hr over 60 Minutes Intravenous  Once 01/14/24 1715 01/14/24 1937   01/14/24 1600  doxycycline  (VIBRAMYCIN ) 100 mg in sodium chloride  0.9 % 250 mL IVPB        100 mg 125 mL/hr over 120 Minutes Intravenous  Once 01/14/24 1518 01/14/24 1939   01/14/24 1530  aztreonam (AZACTAM) 2 g in sodium chloride  0.9 % 100 mL IVPB        2 g 200 mL/hr over 30 Minutes Intravenous  Once 01/14/24  1519 01/14/24 1654      Subjective: Patient was seen and examined at bedside. Overnight events noted. Patient was sitting comfortably on the bed.  His blood pressure remains low. Chest tube was removed yesterday.  Patient been receiving blood transfusion.  Objective: Vitals:   01/20/24 1000 01/20/24 1059 01/20/24 1100 01/20/24 1453  BP: (!) 105/54 109/64 109/64 109/64  Pulse: 93 (!) 102 (!) 105 (!) 104  Resp: (!) 21 (!) 23 20 20   Temp:  (!) 97.5 F (36.4 C)  97.8 F (36.6 C)  TempSrc:  Oral  Oral  SpO2: 100% 98% 100% 99%  Weight:      Height:        Intake/Output Summary (Last 24 hours) at 01/20/2024 1831 Last data filed at 01/20/2024 0981 Gross per 24 hour  Intake 240 ml  Output 2275 ml  Net -2035 ml   Filed Weights   01/14/24 1133 01/14/24 2133 01/16/24 2045  Weight: 108.9 kg 108.8 kg 109.5 kg   Exam:  Constitutional:  The patient is awake, alert, and oriented x 3. No acute distress. Respiratory:  Diminished breath sounds on right and left. Increased work of breathing. No wheezes, rales, or rhonchi No tactile fremitus Cardiovascular:  Regular rate and rhythm No murmurs, ectopy, or gallups. No lateral PMI. No thrills. Abdomen:  Abdomen is soft, non-tender,  non-distended No hernias, masses, or organomegaly Normoactive bowel sounds.  Musculoskeletal:  No cyanosis, clubbing, or edema Skin:  No rashes, lesions, ulcers palpation of skin: no induration or nodules Neurologic:  CN 2-12 intact Sensation all 4 extremities intact Psychiatric:  Mental status Mood, affect appropriate Orientation to person, place, time  judgment and insight appear intact     Data Reviewed: I have personally reviewed following labs and imaging studies  CBC: Recent Labs  Lab 01/16/24 2102 01/17/24 0035 01/18/24 0802 01/19/24 0325 01/19/24 1809 01/20/24 0254  WBC 18.8* 17.9* 16.0* 14.9*  --  14.9*  HGB 9.3* 9.0* 7.7* 6.7* 8.3* 8.4*  HCT 29.7* 28.3* 24.9* 20.3* 25.4*  25.9*  MCV 95.5 95.6 99.6 94.9  --  92.2  PLT 474* 431* 429* 404*  --  364   Basic Metabolic Panel: Recent Labs  Lab 01/14/24 1409 01/15/24 0842 01/16/24 0803 01/16/24 2102 01/17/24 0235 01/18/24 0802 01/19/24 0325  NA  --    < > 130* 132* 132* 129* 126*  K  --    < > 4.2 4.3 4.5 4.4 4.4  CL  --    < > 100 98 100 98 96*  CO2  --    < > 21* 19* 21* 22 22  GLUCOSE  --    < > 174* 199* 226* 139* 128*  BUN  --    < > 10 13 16  27* 32*  CREATININE  --    < > 0.73 1.07 1.16 0.99 0.91  CALCIUM   --    < > 8.7* 8.4* 8.5* 8.1* 8.2*  MG 2.1  --  2.0  --   --  2.1 2.1  PHOS  --   --  3.0  --   --  2.4* 2.3*   < > = values in this interval not displayed.   GFR: Estimated Creatinine Clearance: 102.3 mL/min (by C-G formula based on SCr of 0.91 mg/dL). Liver Function Tests: Recent Labs  Lab 01/14/24 1200 01/15/24 1513  AST 28  --   ALT 20  --   ALKPHOS 82  --   BILITOT 0.3  --   PROT 7.5 6.2*  ALBUMIN 2.8*  --    No results for input(s): "LIPASE", "AMYLASE" in the last 168 hours. No results for input(s): "AMMONIA" in the last 168 hours. Coagulation Profile: No results for input(s): "INR", "PROTIME" in the last 168 hours. Cardiac Enzymes: No results for input(s): "CKTOTAL", "CKMB", "CKMBINDEX", "TROPONINI" in the last 168 hours. BNP (last 3 results) No results for input(s): "PROBNP" in the last 8760 hours. HbA1C: No results for input(s): "HGBA1C" in the last 72 hours.  CBG: Recent Labs  Lab 01/19/24 2332 01/20/24 0335 01/20/24 0754 01/20/24 1053 01/20/24 1456  GLUCAP 122* 158* 120* 186* 142*   Lipid Profile: No results for input(s): "CHOL", "HDL", "LDLCALC", "TRIG", "CHOLHDL", "LDLDIRECT" in the last 72 hours. Thyroid  Function Tests: No results for input(s): "TSH", "T4TOTAL", "FREET4", "T3FREE", "THYROIDAB" in the last 72 hours. Anemia Panel: No results for input(s): "VITAMINB12", "FOLATE", "FERRITIN", "TIBC", "IRON", "RETICCTPCT" in the last 72 hours. Sepsis  Labs: Recent Labs  Lab 01/16/24 2102 01/16/24 2250 01/17/24 0033 01/17/24 0235 01/17/24 0955  PROCALCITON  --  0.80  --   --   --   LATICACIDVEN 2.9*  --  1.8 2.6* 3.1*    Recent Results (from the past 240 hours)  Resp panel by RT-PCR (RSV, Flu A&B, Covid) Anterior Nasal Swab     Status: None  Collection Time: 01/14/24 12:09 PM   Specimen: Anterior Nasal Swab  Result Value Ref Range Status   SARS Coronavirus 2 by RT PCR NEGATIVE NEGATIVE Final    Comment: (NOTE) SARS-CoV-2 target nucleic acids are NOT DETECTED.  The SARS-CoV-2 RNA is generally detectable in upper respiratory specimens during the acute phase of infection. The lowest concentration of SARS-CoV-2 viral copies this assay can detect is 138 copies/mL. A negative result does not preclude SARS-Cov-2 infection and should not be used as the sole basis for treatment or other patient management decisions. A negative result may occur with  improper specimen collection/handling, submission of specimen other than nasopharyngeal swab, presence of viral mutation(s) within the areas targeted by this assay, and inadequate number of viral copies(<138 copies/mL). A negative result must be combined with clinical observations, patient history, and epidemiological information. The expected result is Negative.  Fact Sheet for Patients:  BloggerCourse.com  Fact Sheet for Healthcare Providers:  SeriousBroker.it  This test is no t yet approved or cleared by the [place]  FDA and  has been authorized for detection and/or diagnosis of SARS-CoV-2 by FDA under an Emergency Use Authorization (EUA). This EUA will remain  in effect (meaning this test can be used) for the duration of the COVID-19 declaration under Section 564(b)(1) of the Act, 21 U.S.C.section 360bbb-3(b)(1), unless the authorization is terminated  or revoked sooner.       Influenza A by PCR NEGATIVE NEGATIVE Final    Influenza B by PCR NEGATIVE NEGATIVE Final    Comment: (NOTE) The Xpert Xpress SARS-CoV-2/FLU/RSV plus assay is intended as an aid in the diagnosis of influenza from Nasopharyngeal swab specimens and should not be used as a sole basis for treatment. Nasal washings and aspirates are unacceptable for Xpert Xpress SARS-CoV-2/FLU/RSV testing.  Fact Sheet for Patients: BloggerCourse.com  Fact Sheet for Healthcare Providers: SeriousBroker.it  This test is not yet approved or cleared by the [place]  FDA and has been authorized for detection and/or diagnosis of SARS-CoV-2 by FDA under an Emergency Use Authorization (EUA). This EUA will remain in effect (meaning this test can be used) for the duration of the COVID-19 declaration under Section 564(b)(1) of the Act, 21 U.S.C. section 360bbb-3(b)(1), unless the authorization is terminated or revoked.     Resp Syncytial Virus by PCR NEGATIVE NEGATIVE Final    Comment: (NOTE) Fact Sheet for Patients: BloggerCourse.com  Fact Sheet for Healthcare Providers: SeriousBroker.it  This test is not yet approved or cleared by the [place]  FDA and has been authorized for detection and/or diagnosis of SARS-CoV-2 by FDA under an Emergency Use Authorization (EUA). This EUA will remain in effect (meaning this test can be used) for the duration of the COVID-19 declaration under Section 564(b)(1) of the Act, 21 U.S.C. section 360bbb-3(b)(1), unless the authorization is terminated or revoked.  Performed at [facility], [address], [place], [place] [address]   Culture, blood (routine x 2)     Status: None   Collection Time: 01/14/24  5:00 PM   Specimen: BLOOD  Result Value Ref Range Status   Specimen Description BLOOD BLOOD RIGHT HAND  Final   Special Requests   Final    BOTTLES DRAWN AEROBIC AND ANAEROBIC Blood Culture adequate volume    Culture   Final    NO GROWTH 5 DAYS Performed at [facility], [address], [place], [place] [address]    Report Status 01/19/2024 FINAL  Final  Culture, blood (routine x 2)  Status: None   Collection Time: 01/14/24  5:47 PM   Specimen: BLOOD  Result Value Ref Range Status   Specimen Description BLOOD RIGHT ANTECUBITAL  Final   Special Requests   Final    BOTTLES DRAWN AEROBIC ONLY Blood Culture adequate volume   Culture   Final    NO GROWTH 5 DAYS Performed at [facility], [address] [place], [place] [address]    Report Status 01/19/2024 FINAL  Final  MRSA Next Gen by PCR, Nasal     Status: None   Collection Time: 01/15/24 12:12 AM   Specimen: Urine, Clean Catch; Nasal Swab  Result Value Ref Range Status   MRSA by PCR Next Gen NOT DETECTED NOT DETECTED Final    Comment: (NOTE) The GeneXpert MRSA Assay (FDA approved for NASAL specimens only), is one component of a comprehensive MRSA colonization surveillance program. It is not intended to diagnose MRSA infection nor to guide or monitor treatment for MRSA infections. Test performance is not FDA approved in patients less than 2 years old. Performed at [facility], [address] [place], [place] [address]   Body fluid culture w Gram Stain     Status: None   Collection Time: 01/15/24  2:06 PM   Specimen: Pleural Fluid  Result Value Ref Range Status   Specimen Description PLEURAL  Final   Special Requests left  Final   Gram Stain NO WBC SEEN NO ORGANISMS SEEN   Final   Culture   Final    NO GROWTH 3 DAYS Performed at [facility], [address] [place], [place] [address]    Report Status 01/18/2024 FINAL  Final  Fungus Culture With Stain     Status: None (Preliminary result)   Collection Time: 01/15/24  2:06 PM   Specimen: Pleural, Left; Pleural Fluid  Result Value Ref Range Status   Fungus Stain Final report  Final    Comment: (NOTE) Performed At: [facility] [address]  [place], [place] [id number] Pearlean Botts MD OZ:[id number]    Fungus (Mycology) Culture PENDING  Incomplete   Fungal Source PLEURAL  Final    Comment: Performed at [facility], [address], [place], [place] [address]  Fungus Culture Result     Status: None   Collection Time: 01/15/24  2:06 PM  Result Value Ref Range Status   Result 1 Comment  Final    Comment: (NOTE) KOH/Calcofluor preparation:  no fungus observed. Performed At: [facility] [address] [place], [place] [id number] Pearlean Botts MD UU:[id number]   Culture, blood (Routine X 2) w Reflex to ID Panel     Status: None (Preliminary result)   Collection Time: 01/16/24 10:50 PM   Specimen: BLOOD RIGHT ARM  Result Value Ref Range Status   Specimen Description BLOOD RIGHT ARM  Final   Special Requests   Final    BOTTLES DRAWN AEROBIC AND ANAEROBIC Blood Culture adequate volume   Culture   Final    NO GROWTH 4 DAYS Performed at [facility], [address], [place], [place] [address]    Report Status PENDING  Incomplete  Culture, blood (Routine X 2) w Reflex to ID Panel     Status: None (Preliminary result)   Collection Time: 01/16/24 10:50 PM   Specimen: BLOOD LEFT HAND  Result Value Ref Range Status   Specimen Description BLOOD LEFT HAND  Final   Special Requests   Final  BOTTLES DRAWN AEROBIC AND ANAEROBIC Blood Culture results may not be optimal due to an inadequate volume of blood received in culture bottles   Culture   Final    NO GROWTH 4 DAYS Performed at [facility], [address] [place], [place] [address]    Report Status PENDING  Incomplete    Radiology Studies: CT CHEST W CONTRAST Result Date: 01/19/2024 CLINICAL DATA:  Pleural effusion, known or suspected (Ped 0-17y) empyema vs hemothorax left pleural space. EXAM: CT CHEST WITH CONTRAST TECHNIQUE: Multidetector CT imaging of the chest was performed during intravenous contrast administration. RADIATION DOSE REDUCTION:  This exam was performed according to the departmental dose-optimization program which includes automated exposure control, adjustment of the mA and/or kV according to patient size and/or use of iterative reconstruction technique. CONTRAST:  75mL OMNIPAQUE  IOHEXOL  350 MG/ML SOLN COMPARISON:  Noncontrast chest CT 2 days ago, additional prior exams reviewed including PET CT 12/31/2023 FINDINGS: Cardiovascular: The heart is normal in size. Small pericardial fluid. Aortic atherosclerosis without acute aortic findings. There is no central pulmonary embolus on this exam not tailored to pulmonary artery CIS mint. Mediastinum/Nodes: Stable mediastinal and left hilar adenopathy. Representative highest mediastinal node measures 11 mm series 2, image 40. patulous esophagus. Lungs/Pleura: Small partially loculated pleural effusion. There is no significant pleural enhancement. Fluid appears to be simple in density. Small amount of fluid tracks into the inter lobar fissure. No previous pleural catheter has been removed. Left upper lobe paramediastinal consolidation and ground-glass, without significant change from recent CT, but mild progression from PET earlier this month. There is ground-glass and septal thickening within the left lower lobe. Emphysema with areas of subpleural reticulation in both lungs. There is no right pleural effusion Upper Abdomen: No acute upper abdominal findings. Musculoskeletal: No acute findings. Degenerative change in the spine. IMPRESSION: 1. Small partially loculated left pleural effusion, not significantly changed in size after removal of pleural catheter. Fluid appears to be simple in density. No convincing pleural enhancement. Recommend correlation with fluid sampling. 2. Left upper lobe paramediastinal consolidation and ground-glass, without significant change from CT 2 days ago, but mild progression from PET earlier this month. Differential considerations remain infection, neoplasm or drug  reaction. 3. Stable mediastinal and left hilar adenopathy. Aortic Atherosclerosis (ICD10-I70.0) and Emphysema (ICD10-J43.9). Electronically Signed   By: Chadwick Colonel M.D.   On: 01/19/2024 19:52   DG CHEST PORT 1 VIEW Result Date: 01/19/2024 CLINICAL DATA:  Hemothorax EXAM: PORTABLE CHEST 1 VIEW COMPARISON:  01/17/2024 FINDINGS: Continued left perihilar and basilar airspace opacity with blunting of the left costophrenic angle favoring pleural effusion. The left pleural drainage catheter is no longer visible. There is new pleural based density at the left lung apex probably representing a loculated component of pleural effusion. Low lung volumes are present, causing crowding of the pulmonary vasculature. Mild atelectasis or scarring at the right lung base. Obscured left heart border. Degenerative glenohumeral arthropathy on the left. IMPRESSION: 1. Continued left perihilar and basilar airspace opacity with blunting of the left costophrenic angle favoring pleural effusion. 2. New pleural based density at the left lung apex probably representing a loculated component of pleural effusion. 3. Low lung volumes are present, causing crowding of the pulmonary vasculature. 4. Mild atelectasis or scarring at the right lung base. Electronically Signed   By: Freida Jes M.D.   On: 01/19/2024 17:46   Scheduled Meds:  sodium chloride    Intravenous Once   Chlorhexidine  Gluconate Cloth  6 each Topical q  morning   feeding supplement  237 mL Oral BID BM   ferrous sulfate  325 mg Oral Daily   furosemide  20 mg Oral Daily   insulin aspart  0-15 Units Subcutaneous Q4H   mouth rinse  15 mL Mouth Rinse 4 times per day   pantoprazole   40 mg Oral QAC breakfast   rosuvastatin   40 mg Oral Daily   sodium chloride  flush  10 mL Intrapleural Q8H   sodium chloride   1 g Oral TID WC   Continuous Infusions:     LOS: 6 days    Time spent: 38 mins    Nevaya Nagele, MD [facility]   If 7PM-7AM, please  contact night-coverage

## 2024-01-20 NOTE — Progress Notes (Signed)
 NAME:  [name], MRN:  [id number], DOB:  1955/10/20, LOS: 6 ADMISSION DATE:  01/14/2024, CONSULTATION DATE:  01/14/24 REFERRING MD:  Margaree Shark - EM, CHIEF COMPLAINT:  SOB // L pleural effusion   History of Present Illness:  68 yo M PMH PE currently on eliquis , former smoker, metastatic prostate cancer on enzalutamide  and sipuleucel-T , HLD, GERD, HTN who presented to [facility] ED 01/14/24 w CC SOB and dizziness. In ED had a CTA chest which reveals a loculated appearing left pleural effusion, enlarged mediastinal lymph nodes + consolidative L lung changes, no evidence of PE.   PCCM is called in this setting and has recommended admission to medicine with PCCM consultation for management of L pleural effusion and CT placement.  Pertinent Medical History:  PE on Eliquis  HX smoking Metastatic prostate cancer HTN HLD GERD   Significant Hospital Events: Including procedures, antibiotic start and stop dates in addition to other pertinent events   5/22 - [facility] ED w SOB. CTA Chest with loculated appearing left pleural effusion 5/23 - Transferred to [facility] for higher level of care. Eliquis  held, heparin  gtt started. 5/23 chest tube placed 5/24 intrapleural fibrinolytics x1 resulting in large hemothorax and ICU transfer 5/25 transferred back out of ICU, output slowed 5/26 tube pulled out due to low output, CT Chest with resolved effusion, signed off 5/27 called back for anemia, shortness of breath, concern for hemothorax, ct chest obtained  Interim History / Subjective:  No overnight issues, feels better than yesterday afternoon. Ct chest obtained. Has not been getting Out of bed.   Objective:   Blood pressure 104/85, pulse (!) 104, temperature 98 F (36.7 C), temperature source Axillary, resp. rate 17, height 6\' 2"  (1.88 m), weight 109.5 kg, SpO2 96%.        Intake/Output Summary (Last 24 hours) at 01/20/2024 0913 Last data filed at 01/20/2024 4098 Gross per 24 hour  Intake 1165 ml  Output 2550  ml  Net -1385 ml   Filed Weights   01/14/24 1133 01/14/24 2133 01/16/24 2045  Weight: 108.9 kg 108.8 kg 109.5 kg   Physical Examination: Chronically ill appearing, sitting up in bed eating breakfast Breathing non labored, no wheeze on nasal cannula Normal speech, no focal asymmetry  Labs and imaging personally reviewed CT Chest personally reviewed shows tiny fluid collection, not amenable to bedside drainage.  Extensive emphysema with early fibrotic changes.   Pleural fluid studies consistent with exudative process slight neutrophil predominance, glucose 135.  Cytology still pending    Resolved problem List:   Assessment and Plan:   Loculated L pleural effusion S/p 1 dose of intrapleural fibrinolytics with resultant hemothorax Acute blood loss anemia - unclear etiology Recent PE, on Eliquis  Prostate Cancer on active treatment Recent CAP s/p 5 adys abx   CT Chest shows clinically insignificant fluid collection that would not be an explanation for his acute anemia from yesterday. He is not bleeding into his lungs. We are still awaiting cytology from the pleural effusion last week, likely delayed from long weekend. I will call down to cyto lab today.    Pulmonary will follow.     Louie Rover, MD Pulmonary and Critical Care Medicine [facility] 01/20/2024 9:13 AM Pager: see AMION  If no response to pager, please call critical care on call (see AMION) until 7pm After 7:00 pm call Elink      Labs   CBC: Recent Labs  Lab 01/16/24 2102 01/17/24 0035 01/18/24 0802 01/19/24 0325 01/19/24 1809 01/20/24  0254  WBC 18.8* 17.9* 16.0* 14.9*  --  14.9*  HGB 9.3* 9.0* 7.7* 6.7* 8.3* 8.4*  HCT 29.7* 28.3* 24.9* 20.3* 25.4* 25.9*  MCV 95.5 95.6 99.6 94.9  --  92.2  PLT 474* 431* 429* 404*  --  364   Basic Metabolic Panel: Recent Labs  Lab 01/14/24 1409 01/15/24 0842 01/16/24 0803 01/16/24 2102 01/17/24 0235 01/18/24 0802 01/19/24 0325  NA  --    < > 130*  132* 132* 129* 126*  K  --    < > 4.2 4.3 4.5 4.4 4.4  CL  --    < > 100 98 100 98 96*  CO2  --    < > 21* 19* 21* 22 22  GLUCOSE  --    < > 174* 199* 226* 139* 128*  BUN  --    < > 10 13 16  27* 32*  CREATININE  --    < > 0.73 1.07 1.16 0.99 0.91  CALCIUM   --    < > 8.7* 8.4* 8.5* 8.1* 8.2*  MG 2.1  --  2.0  --   --  2.1 2.1  PHOS  --   --  3.0  --   --  2.4* 2.3*   < > = values in this interval not displayed.   GFR: Estimated Creatinine Clearance: 102.3 mL/min (by C-G formula based on SCr of 0.91 mg/dL). Recent Labs  Lab 01/16/24 2102 01/16/24 2250 01/17/24 0033 01/17/24 0035 01/17/24 0235 01/17/24 0955 01/18/24 0802 01/19/24 0325 01/20/24 0254  PROCALCITON  --  0.80  --   --   --   --   --   --   --   WBC 18.8*  --   --  17.9*  --   --  16.0* 14.9* 14.9*  LATICACIDVEN 2.9*  --  1.8  --  2.6* 3.1*  --   --   --    Liver Function Tests: Recent Labs  Lab 01/14/24 1200 01/15/24 1513  AST 28  --   ALT 20  --   ALKPHOS 82  --   BILITOT 0.3  --   PROT 7.5 6.2*  ALBUMIN 2.8*  --    No results for input(s): "LIPASE", "AMYLASE" in the last 168 hours. No results for input(s): "AMMONIA" in the last 168 hours.  ABG:    Component Value Date/Time   PHART 7.44 01/16/2024 2029   PCO2ART 28 (L) 01/16/2024 2029   PO2ART 89 01/16/2024 2029   HCO3 19.0 (L) 01/16/2024 2029   TCO2 24 11/26/2010 0919   ACIDBASEDEF 3.8 (H) 01/16/2024 2029   O2SAT 96.8 01/16/2024 2029

## 2024-01-20 NOTE — Plan of Care (Signed)
  Problem: Education: Goal: Knowledge of General Education information will improve Description: Including pain rating scale, medication(s)/side effects and non-pharmacologic comfort measures Outcome: Progressing   Problem: Nutrition: Goal: Adequate nutrition will be maintained Outcome: Progressing   Problem: Coping: Goal: Level of anxiety will decrease Outcome: Progressing   Problem: Elimination: Goal: Will not experience complications related to bowel motility Outcome: Progressing   Problem: Safety: Goal: Ability to remain free from injury will improve Outcome: Progressing

## 2024-01-21 ENCOUNTER — Other Ambulatory Visit: Payer: Self-pay

## 2024-01-21 DIAGNOSIS — C61 Malignant neoplasm of prostate: Secondary | ICD-10-CM | POA: Diagnosis not present

## 2024-01-21 DIAGNOSIS — Z7901 Long term (current) use of anticoagulants: Secondary | ICD-10-CM

## 2024-01-21 DIAGNOSIS — C7951 Secondary malignant neoplasm of bone: Secondary | ICD-10-CM | POA: Diagnosis not present

## 2024-01-21 DIAGNOSIS — D649 Anemia, unspecified: Secondary | ICD-10-CM

## 2024-01-21 DIAGNOSIS — J90 Pleural effusion, not elsewhere classified: Secondary | ICD-10-CM | POA: Diagnosis not present

## 2024-01-21 DIAGNOSIS — Z5181 Encounter for therapeutic drug level monitoring: Secondary | ICD-10-CM

## 2024-01-21 LAB — GLUCOSE, CAPILLARY
Glucose-Capillary: 143 mg/dL — ABNORMAL HIGH (ref 70–99)
Glucose-Capillary: 125 mg/dL — ABNORMAL HIGH (ref 70–99)
Glucose-Capillary: 158 mg/dL — ABNORMAL HIGH (ref 70–99)
Glucose-Capillary: 171 mg/dL — ABNORMAL HIGH (ref 70–99)
Glucose-Capillary: 176 mg/dL — ABNORMAL HIGH (ref 70–99)

## 2024-01-21 LAB — CBC WITH DIFFERENTIAL/PLATELET
Abs Immature Granulocytes: 0.6 10*3/uL — ABNORMAL HIGH (ref 0.00–0.07)
Basophils Absolute: 0 10*3/uL (ref 0.0–0.1)
Basophils Relative: 0 %
Eosinophils Absolute: 0.6 10*3/uL — ABNORMAL HIGH (ref 0.0–0.5)
Eosinophils Relative: 5 %
HCT: 28.2 % — ABNORMAL LOW (ref 39.0–52.0)
Hemoglobin: 9 g/dL — ABNORMAL LOW (ref 13.0–17.0)
Lymphocytes Relative: 4 %
Lymphs Abs: 0.5 10*3/uL — ABNORMAL LOW (ref 0.7–4.0)
MCH: 30.2 pg (ref 26.0–34.0)
MCHC: 31.9 g/dL (ref 30.0–36.0)
MCV: 94.6 fL (ref 80.0–100.0)
Monocytes Absolute: 1.2 10*3/uL — ABNORMAL HIGH (ref 0.1–1.0)
Monocytes Relative: 9 %
Myelocytes: 4 %
Neutro Abs: 9.9 10*3/uL — ABNORMAL HIGH (ref 1.7–7.7)
Neutrophils Relative %: 77 %
Platelets: 340 10*3/uL (ref 150–400)
Promyelocytes Relative: 1 %
RBC: 2.98 MIL/uL — ABNORMAL LOW (ref 4.22–5.81)
RDW: 16.2 % — ABNORMAL HIGH (ref 11.5–15.5)
WBC: 12.8 10*3/uL — ABNORMAL HIGH (ref 4.0–10.5)
nRBC: 0 /100{WBCs}
nRBC: 0.2 % (ref 0.0–0.2)

## 2024-01-21 LAB — BASIC METABOLIC PANEL WITH GFR
Anion gap: 8 (ref 5–15)
BUN: 13 mg/dL (ref 8–23)
CO2: 26 mmol/L (ref 22–32)
Calcium: 8.4 mg/dL — ABNORMAL LOW (ref 8.9–10.3)
Chloride: 101 mmol/L (ref 98–111)
Creatinine, Ser: 0.75 mg/dL (ref 0.61–1.24)
GFR, Estimated: >60 mL/min (ref >60)
Glucose, Bld: 150 mg/dL — ABNORMAL HIGH (ref 70–99)
Potassium: 4.4 mmol/L (ref 3.5–5.1)
Sodium: 135 mmol/L (ref 135–145)

## 2024-01-21 LAB — CULTURE, BLOOD (ROUTINE X 2)
Culture: NO GROWTH
Culture: NO GROWTH
Special Requests: ADEQUATE

## 2024-01-21 MED ORDER — APIXABAN 2.5 MG PO TABS: 2.5000 mg | ORAL_TABLET | Freq: Two times a day (BID) | ORAL | 0 refills | Status: DC

## 2024-01-21 MED ORDER — SODIUM CHLORIDE 1 G PO TABS: 1.0000 g | ORAL_TABLET | Freq: Once | ORAL | 0 refills | Status: AC

## 2024-01-21 MED ORDER — ENSURE ENLIVE PO LIQD: 237.0000 mL | Freq: Two times a day (BID) | ORAL | 12 refills | Status: DC

## 2024-01-21 MED ORDER — ALBUTEROL SULFATE (2.5 MG/3ML) 0.083% IN NEBU: 2.5000 mg | INHALATION_SOLUTION | RESPIRATORY_TRACT | 12 refills | Status: DC | PRN

## 2024-01-21 NOTE — Discharge Summary (Signed)
 Physician Discharge Summary   Patient: [name] MRN: [id number] DOB: [birth year]  Admit date:     01/14/2024  Discharge date: 01/21/24  Discharge Physician: Junita Oliva   PCP: [name], MD   Recommendations at discharge:    Discharge to  home on home O2 4L by . Follow up with Dr. Dione Franks as instructed.  Follow up with Dr [name] as instructed. Apixaban  dose has been changed to 2.5 mg twice daily.  Discharge Diagnoses: Principal Problem:   Loculated pleural effusion Active Problems:   Essential hypertension   Obesity (BMI 30-39.9)   COPD (chronic obstructive pulmonary disease) with chronic bronchitis (HCC)   GERD (gastroesophageal reflux disease)   Type 2 diabetes mellitus with obesity (HCC)   Prostate cancer metastatic to bone (HCC)   Pulmonary embolism (HCC)   Pleural effusion, left   History of pulmonary embolism   Anticoagulation management encounter  Resolved Problems:   * No resolved hospital problems. [facility] Course: This 68 yrs old male with PMH significant for hypertension, hyperlipidemia, GERD, metastatic prostate cancer followed by Dr. [name], on enzalutamide  and sipuleucel-T , recent hospitalization where he was diagnosed with PE, started on Eliquis , presents to ED secondary to shortness of breath, fatigue, dizziness, lightheadedness, worsening dyspnea over the last month. In ED lactic acid at 2.0, white blood cell count at 11.8, CTA chest obtained, no evidence of PE, but significant for left lung base consolidation with significant loculated pleural effusion,.  Patient was admitted for further evaluation.  Pulmonology is cosulted and chest tube was placed. Pleural fluid was sent for cytology. It has returned as a malignant effusion that is consistent with metastatic from the patient's known prostate CA.    The patient continues to require 4L O2 in order to maintain saturations in the 90's.   I have discussed the patient with both Dr. [name] and Dr. Dione Franks.  There are no plans for further inpatient treatment or intervention for the patient's malignant pleural effusion. The patient requires 4L O2 when ambulating. He will be discharged to home on 4L.  Assessment and Plan: Principal Problem:   Loculated pleural effusion Active Problems:   Essential hypertension   Obesity (BMI 30-39.9)   COPD (chronic obstructive pulmonary disease) with chronic bronchitis (HCC)   GERD (gastroesophageal reflux disease)   Type 2 diabetes mellitus with obesity (HCC)   Prostate cancer metastatic to bone (HCC)   Pulmonary embolism (HCC)   Pleural effusion, left   History of pulmonary embolism     Loculated left pleural effusion: Status post intrapleural fibrinolytics with hemothorax: Sepsis due to HCAP: Patient presented with sepsis criteria (elevated lactic acid, leukocytosis, tachypnea and tachycardia) There is high concern for aspiration, his wife reports significant coughing / choking when eating.  Continue on dysphagia diet, Speech and swallow evaluation recommended same. Initiated on empiric antibiotics ( Vancomycin , Azactam and Flagyl  ) Encourage use of  incentive spirometry and flutter valve. Pulmonology consulted , Patient underwent successful chest tube insertion. Maintain chest tube to suction,  awaiting cytology. Continue heparin .  Continue supplemental oxygen. Status post intrapleural fibrinolytic therapy,  Patient has developed high output in the chest tube concerning for hemothorax.  Patient became hypotensive,  tachycardic and was transferred to ICU. Now the chest tube output has reduced,  no further into pleural fibrinolytics for now. Chest for successfully removed 01/18/24. Pulmonology recommended outpatient antibiotics for 5 days based on cultures. Remainder of pleural effusion not amenable to bedside drainage.   Malignanct Pleural Effusion Pleural fluid  was sent for cytology. It has returned as a malignant effusion that is consistent with  metastatic from the patient's known prostate CA. I have discussed this with both Dr. Dione Franks and Dr. [name]. There are no plans for further inpatient treatment or intervention. The patient will be discharged to home. He will follow up with both pulmonology and hematology oncology.  Pulmonary embolism: Diagnosed with PE and started on Eliquis  upon recent hospitalization.   Has been compliant with this medication. Patient placed on heparin  as he has required chest tube insertion. Dr [name] has recommended that the patient be discharged to home on Eliquis  2.5 mg bid.    Acute blood loss anemia: Hemoglobin dropped from 7.7 > 6.7 on 01/19/2024. There is no any obvious visible bleeding noted. Transfused 2 units PRBC. Hemoglobin 9.0 on the morning of 01/21/2024. Monitor Hgb.   Prolonged QT interval: Hold  QT prolonging agents for now. Hold Abilify , Plaquenil, Effexor and desipramine   Essential hypertension: Continue home meds.   Mixed hyperlipidemia: Continue Crestor , fenofibrate .   GERD: Continue Protonix .   Metastatic prostate cancer: Patient follows with Dr. [name], [name] For now we will hold his Xtandi  due to acute infection   Rheumatoid  arthritis: -hold Plaquenil due to prolonged QTc   Hyponatremia: Likely SAIDH due to malignant pleural effusion. Will start fluid restriction. Start salt tablets 1gm TID Consider nephrology consult if continues to remain low.        Consultants:  PCCM Hematology/Oncology Procedures performed: Chest tube insertion with instillation of  fibrinolytics. Drainage of fluid with subsequent removal of chest tube. Disposition: Home Diet recommendation:  Discharge Diet Orders (From admission, onward)     Start     Ordered   01/21/24 0000  Diet - low sodium heart healthy        01/21/24 1324           Cardiac and Carb modified diet DISCHARGE MEDICATION: Allergies as of 01/21/2024       Reactions   Penicillins Anaphylaxis            Medication List     STOP taking these medications    albuterol  108 (90 Base) MCG/ACT inhaler Commonly known as: VENTOLIN  HFA Replaced by: albuterol  (2.5 MG/3ML) 0.083% nebulizer solution   desipramine 100 MG tablet Commonly known as: NOPRAMIN   valsartan  160 MG tablet Commonly known as: Diovan        TAKE these medications    albuterol  (2.5 MG/3ML) 0.083% nebulizer solution Commonly known as: PROVENTIL  Take 3 mLs (2.5 mg total) by nebulization every 2 (two) hours as needed for wheezing. Replaces: albuterol  108 (90 Base) MCG/ACT inhaler   apixaban  2.5 MG Tabs tablet Commonly known as: ELIQUIS  Take 1 tablet (2.5 mg total) by mouth 2 (two) times daily. What changed:  medication strength how much to take how to take this when to take this additional instructions   ARIPiprazole  10 MG tablet Commonly known as: ABILIFY  Take 10 mg by mouth at bedtime.   Choline Fenofibrate  135 MG capsule Take 135 mg by mouth at bedtime. trilipix   Co Q 10 100 MG Caps Take 100 mg by mouth every evening.   famotidine  20 MG tablet Commonly known as: Pepcid  One after supper What changed:  how much to take how to take this when to take this   feeding supplement Liqd Take 237 mLs by mouth 2 (two) times daily between meals. Start taking on: Jan 22, 2024   ferrous sulfate  325 (65 FE) MG  tablet Take 325 mg by mouth daily.   furosemide 20 MG tablet Commonly known as: LASIX Take 20 mg by mouth daily.   hydroxychloroquine 200 MG tablet Commonly known as: PLAQUENIL Take 200 mg by mouth 2 (two) times daily.   multivitamin with minerals Tabs tablet Take 1 tablet by mouth daily.   pantoprazole  40 MG tablet Commonly known as: PROTONIX  TAKE 1 TABLET DAILY 30 MINUTES BEFORE BREAKFAST What changed: See the new instructions.   rosuvastatin  40 MG tablet Commonly known as: CRESTOR  TAKE 1 TABLET DAILY What changed: when to take this   sodium chloride  1 g tablet Take 1 tablet (1 g  total) by mouth once for 1 dose.   venlafaxine XR 75 MG 24 hr capsule Commonly known as: EFFEXOR-XR Take 225 mg by mouth daily with breakfast.   Xtandi  40 MG capsule Generic drug: enzalutamide  Take 4 capsules (160 mg total) by mouth daily. Takes 4 capsules daily               Durable Medical Equipment  (From admission, onward)           Start     Ordered   01/21/24 1322  DME Oxygen  Once       Question Answer Comment  Length of Need Lifetime   Mode or (Route) Nasal cannula   Liters per Minute 4   Oxygen delivery system Gas      01/21/24 1324   01/21/24 0000  For home use only DME Nebulizer machine       Question Answer Comment  Patient needs a nebulizer to treat with the following condition Chronic hypoxic respiratory failure (HCC)   Length of Need Lifetime   Additional equipment included Administration kit   Additional equipment included Filter      01/21/24 1324            Follow-up Information     [facility] Follow up.   Why: Oxygen to be arranged for the home. Contact information: [address] [place] [address] [phone]         [facility], [facility] Follow up.   Why: Physical and Occupational Therapy-office to call with visit times. Contact information: [address] [name] [address] [phone]                Discharge Exam: Cleavon Curls Weights   01/14/24 1133 01/14/24 2133 01/16/24 2045  Weight: 108.9 kg 108.8 kg 109.5 kg   Exam:  Constitutional:  The patient is awake, alert, and oriented x 3. No acute distress. Respiratory:  No increased work of breathing. No wheezes, rales, or rhonchi No tactile fremitus Cardiovascular:  Regular rate and rhythm No murmurs, ectopy, or gallups. No lateral PMI. No thrills. Abdomen:  Abdomen is soft, non-tender, non-distended No hernias, masses, or organomegaly Normoactive bowel sounds.  Musculoskeletal:  No cyanosis, clubbing, or  edema Skin:  No rashes, lesions, ulcers palpation of skin: no induration or nodules Neurologic:  CN 2-12 intact Sensation all 4 extremities intact Psychiatric:  Mental status Mood, affect appropriate Orientation to person, place, time  judgment and insight appear intact   Condition at discharge: fair  The results of significant diagnostics from this hospitalization (including imaging, microbiology, ancillary and laboratory) are listed below for reference.   Imaging Studies: CT CHEST W CONTRAST Result Date: 01/19/2024 CLINICAL DATA:  Pleural effusion, known or suspected (Ped 0-17y) empyema vs hemothorax left pleural space. EXAM: CT CHEST WITH CONTRAST TECHNIQUE: Multidetector  CT imaging of the chest was performed during intravenous contrast administration. RADIATION DOSE REDUCTION: This exam was performed according to the departmental dose-optimization program which includes automated exposure control, adjustment of the mA and/or kV according to patient size and/or use of iterative reconstruction technique. CONTRAST:  75mL OMNIPAQUE  IOHEXOL  350 MG/ML SOLN COMPARISON:  Noncontrast chest CT 2 days ago, additional prior exams reviewed including PET CT 12/31/2023 FINDINGS: Cardiovascular: The heart is normal in size. Small pericardial fluid. Aortic atherosclerosis without acute aortic findings. There is no central pulmonary embolus on this exam not tailored to pulmonary artery CIS mint. Mediastinum/Nodes: Stable mediastinal and left hilar adenopathy. Representative highest mediastinal node measures 11 mm series 2, image 40. patulous esophagus. Lungs/Pleura: Small partially loculated pleural effusion. There is no significant pleural enhancement. Fluid appears to be simple in density. Small amount of fluid tracks into the inter lobar fissure. No previous pleural catheter has been removed. Left upper lobe paramediastinal consolidation and ground-glass, without significant change from recent CT, but mild  progression from PET earlier this month. There is ground-glass and septal thickening within the left lower lobe. Emphysema with areas of subpleural reticulation in both lungs. There is no right pleural effusion Upper Abdomen: No acute upper abdominal findings. Musculoskeletal: No acute findings. Degenerative change in the spine. IMPRESSION: 1. Small partially loculated left pleural effusion, not significantly changed in size after removal of pleural catheter. Fluid appears to be simple in density. No convincing pleural enhancement. Recommend correlation with fluid sampling. 2. Left upper lobe paramediastinal consolidation and ground-glass, without significant change from CT 2 days ago, but mild progression from PET earlier this month. Differential considerations remain infection, neoplasm or drug reaction. 3. Stable mediastinal and left hilar adenopathy. Aortic Atherosclerosis (ICD10-I70.0) and Emphysema (ICD10-J43.9). Electronically Signed   By: Chadwick Colonel M.D.   On: 01/19/2024 19:52   DG CHEST PORT 1 VIEW Result Date: 01/19/2024 CLINICAL DATA:  Hemothorax EXAM: PORTABLE CHEST 1 VIEW COMPARISON:  01/17/2024 FINDINGS: Continued left perihilar and basilar airspace opacity with blunting of the left costophrenic angle favoring pleural effusion. The left pleural drainage catheter is no longer visible. There is new pleural based density at the left lung apex probably representing a loculated component of pleural effusion. Low lung volumes are present, causing crowding of the pulmonary vasculature. Mild atelectasis or scarring at the right lung base. Obscured left heart border. Degenerative glenohumeral arthropathy on the left. IMPRESSION: 1. Continued left perihilar and basilar airspace opacity with blunting of the left costophrenic angle favoring pleural effusion. 2. New pleural based density at the left lung apex probably representing a loculated component of pleural effusion. 3. Low lung volumes are present,  causing crowding of the pulmonary vasculature. 4. Mild atelectasis or scarring at the right lung base. Electronically Signed   By: Freida Jes M.D.   On: 01/19/2024 17:46   CT CHEST WO CONTRAST Result Date: 01/18/2024 CLINICAL DATA:  Pleural effusion, new no suspected, chest 2 EXAM: CT CHEST WITHOUT CONTRAST TECHNIQUE: Multidetector CT imaging of the chest was performed following the standard protocol without IV contrast. RADIATION DOSE REDUCTION: This exam was performed according to the departmental dose-optimization program which includes automated exposure control, adjustment of the mA and/or kV according to patient size and/or use of iterative reconstruction technique. COMPARISON:  Three days ago FINDINGS: Cardiovascular: Normal heart sounds. Small volume pericardial fluid which is similar to prior. No acute vascular finding. Scattered atheromatous calcification. Mediastinum/Nodes: Unchanged moderate enlargement of lymph nodes in the prevascular and central mediastinum.  Unchanged reticulation of mediastinal fat the lower heart. Lungs/Pleura: Significant decrease in left pleural effusion with mild residual seen the lower major fissure. There is question of nodular thickening along the left pleura, but varies measuring water  density with no activity on recent PET CT in these areas, can correlate with pleural fluid labs. Small bore catheter is in good position at the left posterior pleural cavity. Reticulation in the bilateral lungs (patchy ground-glass opacity on recent imaging) and paramediastinal consolidative type opacity in the left upper lobe. There is a degree chronic lung disease with paraseptal emphysema, although fairly well aerated lungs at PET CT 09/07/2023. Upper Abdomen: Negative Musculoskeletal: No acute finding IMPRESSION: Nearly resolved left pleural effusion with chest tube remaining in place at the lateral left base. Nodular appearance along the left pleural surface has no correlate on  recent PET CT, correlate with pleural fluid labs. No progression pulmonary opacity and mediastinal adenopathy-infection, malignancy, and drug reaction differential considerations based on recent CTs. Electronically Signed   By: Ronnette Coke M.D.   On: 01/18/2024 04:13   DG Chest Port 1 View Result Date: 01/17/2024 CLINICAL DATA:  Acute respiratory failure. EXAM: PORTABLE CHEST 1 VIEW COMPARISON:  01/16/2024 FINDINGS: There is a left-sided pigtail drainage catheter overlying the left lower lung. The left-sided pleural fluid collection is decreased from previous exam. Chronic, bilateral reticular interstitial opacities with a lower lung zone predominance. No significant pneumothorax identified. IMPRESSION: 1. Left-sided pigtail drainage catheter overlying the left lower lung. Decreased left pleural fluid collection. No pneumothorax. 2. Chronic interstitial changes as noted previously. Electronically Signed   By: Kimberley Penman M.D.   On: 01/17/2024 08:49   DG Chest Port 1 View Result Date: 01/16/2024 CLINICAL DATA:  Acute respiratory distress EXAM: PORTABLE CHEST 1 VIEW COMPARISON:  X-ray earlier 01/16/2024 and older. FINDINGS: Film is rotated to left. Left basilar pigtail catheter is again seen and has slightly retracted compared to the previous x-ray. Persistent left-sided effusion adjacent lung opacity. Masslike fullness of the left lung hilum again seen. Please correlate with prior CTA of the chest. Stable cardiopericardial silhouette. Underinflation with some right basilar opacity again seen. No pneumothorax. Overlapping cardiac leads. Fixation hardware along the lower cervical spine. IMPRESSION: Interval retraction of the pigtail catheter at the left lung base. No pneumothorax. No significant oval change when adjusted for technique otherwise. Electronically Signed   By: Adrianna Horde M.D.   On: 01/16/2024 19:55   DG Chest Port 1 View Result Date: 01/16/2024 CLINICAL DATA:  Chest tube EXAM: PORTABLE  CHEST 1 VIEW COMPARISON:  Chest x-ray performed Jan 15, 2024 FINDINGS: A left basilar thoracostomy is present. Persistent opacification of the left lung base. Mild diffuse interstitial prominence. Heart mediastinum are not significantly changed. No pneumothorax. IMPRESSION: 1. Left-sided thoracostomy tube without evidence of pneumothorax. 2. When compared to the prior exam, similar appearance of left basilar consolidation and pleural effusion. 3. No significant interval change. Electronically Signed   By: Reagan Camera M.D.   On: 01/16/2024 08:39   DG CHEST PORT 1 VIEW Result Date: 01/15/2024 CLINICAL DATA:  Left chest tube EXAM: PORTABLE CHEST 1 VIEW COMPARISON:  01/14/2024 FINDINGS: Single frontal view of the chest demonstrates a stable cardiac silhouette. Interval placement of a left chest tube, overlying the medial left lung base. Marked decrease in the multilocular left pleural effusion seen previously. Persistent areas of consolidation are seen within the left perihilar region, most pronounced in the left upper lobe. Stable emphysema. No pneumothorax. No acute bony abnormalities.  IMPRESSION: 1. Marked decrease in the multilocular left pleural effusion after left chest tube placement. 2. Persistent left perihilar consolidation, greatest in the left upper lobe, consistent with pneumonia or atelectasis. Electronically Signed   By: Bobbye Burrow M.D.   On: 01/15/2024 16:18   CT Angio Chest PE W/Cm &/Or Wo Cm Result Date: 01/14/2024 CLINICAL DATA:  Pulmonary embolism EXAM: CT ANGIOGRAPHY CHEST WITH CONTRAST TECHNIQUE: Multidetector CT imaging of the chest was performed using the standard protocol during bolus administration of intravenous contrast. Multiplanar CT image reconstructions and MIPs were obtained to evaluate the vascular anatomy. Multiplanar image (3D post-processing) reconstructions and MIPs were obtained to evaluate the vascular anatomy. RADIATION DOSE REDUCTION: This exam was performed according  to the departmental dose-optimization program which includes automated exposure control, adjustment of the mA and/or kV according to patient size and/or use of iterative reconstruction technique. CONTRAST:  75mL OMNIPAQUE  IOHEXOL  350 MG/ML SOLN COMPARISON:  CT of the chest performed December 01, 2023. PET-CT performed Dec 31, 2023 FINDINGS: Cardiovascular: Normal size heart. No pericardial effusion. Three vessel aortic arch. The main pulmonary artery is within normal limits for size. No evidence of pulmonary embolism to the proximal segmental level. Mediastinum/Nodes: Enlarged lymph nodes are present within the mediastinum. A representative lymph node anterior to the thoracic aorta measures 1.4 cm in short axis dimension on image 37 of series 4. Soft tissue thickening is also increased along the left medial pleural surface, left mediastinum and left hilum. Lungs/Pleura: There is a loculated left-sided pleural effusion. This is moderate in size and worst in the left lung base. Consolidative changes are present in the left lung particularly in the medial left upper lobe and left lower lobe. Underlying changes of chronic lung disease are present with cystic lucencies which are upper lobe and peripheral predominant. Peribronchial thickening is also noted. Upper Abdomen: No acute abnormality. Musculoskeletal: No chest wall abnormality. No acute or significant osseous findings. Review of the MIP images confirms the above findings. IMPRESSION: 1. Increase in abnormal soft tissue thickening along the medial pleural surface of the left lung. 2. Moderate loculated left pleural effusion. 3. Increasing mediastinal lymphadenopathy. 4. No evidence of pulmonary embolism to the proximal segmental level. 5. Scattered consolidative changes are also present in the left lung which may represent superimposed infection. Electronically Signed   By: Reagan Camera M.D.   On: 01/14/2024 16:56   DG Chest Port 1 View Result Date:  01/14/2024 CLINICAL DATA:  Shortness of breath, dizziness. EXAM: PORTABLE CHEST 1 VIEW COMPARISON:  December 01, 2023. FINDINGS: Stable cardiomediastinal silhouette. Stable minimal right basilar scarring or subsegmental atelectasis. Increased left basilar opacity is noted concerning for worsening atelectasis or pneumonia with small left pleural effusion. Bony thorax is unremarkable. IMPRESSION: Increased left basilar opacity is noted concerning for worsening atelectasis or pneumonia with small left pleural effusion. Electronically Signed   By: Rosalene Colon M.D.   On: 01/14/2024 13:56   NM PET (PSMA) SKULL TO MID THIGH Result Date: 12/31/2023 CLINICAL DATA:  Prostate carcinoma with biochemical recurrence. PSA equal 6.8 EXAM: NUCLEAR MEDICINE PET SKULL BASE TO THIGH TECHNIQUE: 8.2 mCi Flotufolastat (Posluma ) was injected intravenously. Full-ring PET imaging was performed from the skull base to thigh after the radiotracer. CT data was obtained and used for attenuation correction and anatomic localization. COMPARISON:  PSMA PET scan 09/07/2023 FINDINGS: NECK No radiotracer activity in neck lymph nodes. Incidental CT finding: None. CHEST New perihilar consolidation in the LEFT upper lobe. New LEFT pleural effusion. Paraseptal  emphysema in the medial LEFT upper lobe is also increased. These findings have progressed rapidly from CT January. New enlarged prevascular lymph nodes. For example 15 mm node image 70/4 with SUV max equal 9.6. Consolidation and peribronchial thickening in the medial LEFT upper lobe has radiotracer activity SUV max equal 7.8. Small RIGHT supraclavicular node with SUV max equal 5.0 on image 51. Incidental CT finding: None. ABDOMEN/PELVIS Prostate: No focal activity in the prostate bed. Lymph nodes: No radiotracer avid pelvic adenopathy. Periaortic retroperitoneal lymph nodes on the LEFT are decreased in activity. For example node LEFT of the aorta on image 135 with SUV max equal 8.9 compared SUV max  equal 31.4. Node measures 8 mm compared to 11 mm. Intense radiotracer activity associated with a node dorsal to the pancreatic head with SUV max equal 26.5 (image 113) decreased from SUV max equal 42. This node measures 11 mm short axis compared to 11 mm for no change. No new adenopathy. Liver: No evidence of liver metastasis. Incidental CT finding: None. SKELETON Lesion in the RIGHT acetabulum with SUV max equal 13.8 compared SUV max equal 9.3 on image 192. New skeletal lesion at the skull base on the LEFT with SUV max equal 21.6 on image 14 just anterior to the external auditory canal. IMPRESSION: 1. New perihilar consolidation in the LEFT upper lobe with radiotracer activity. New radiotracer avid prevascular lymph nodes. New pleural effusion. Differential include unusual pattern for metastatic prostate carcinoma versus nonspecific uptake in reactive adenopathy associated pneumonia or drug reaction. Unusual progression to paraseptal emphysema. Recommend pulmonology consultation. 2. 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 Stable lesion in the RIGHT acetabulum 3. Decreased radiotracer activity in periaortic retroperitoneal lymph nodes. 4. No evidence of local prostate carcinoma recurrence in the prostate bed. 5. No evidence of liver metastasis. These results will be called to the ordering clinician or representative by the Radiologist Assistant, and communication documented in the PACS or [employer]. Electronically Signed   By: Deboraha Fallow M.D.   On: 12/31/2023 16:44    Microbiology: Results for orders placed or performed during the hospital encounter of 01/14/24  Resp panel by RT-PCR (RSV, Flu A&B, Covid) Anterior Nasal Swab     Status: None   Collection Time: 01/14/24 12:09 PM   Specimen: Anterior Nasal Swab  Result Value Ref Range Status   SARS Coronavirus 2 by RT PCR NEGATIVE NEGATIVE Final    Comment: (NOTE) SARS-CoV-2 target nucleic acids are NOT  DETECTED.  The SARS-CoV-2 RNA is generally detectable in upper respiratory specimens during the acute phase of infection. The lowest concentration of SARS-CoV-2 viral copies this assay can detect is 138 copies/mL. A negative result does not preclude SARS-Cov-2 infection and should not be used as the sole basis for treatment or other patient management decisions. A negative result may occur with  improper specimen collection/handling, submission of specimen other than nasopharyngeal swab, presence of viral mutation(s) within the areas targeted by this assay, and inadequate number of viral copies(<138 copies/mL). A negative result must be combined with clinical observations, patient history, and epidemiological information. The expected result is Negative.  Fact Sheet for Patients:  BloggerCourse.com  Fact Sheet for Healthcare Providers:  SeriousBroker.it  This test is no t yet approved or cleared by the [place]  FDA and  has been authorized for detection and/or diagnosis of SARS-CoV-2 by FDA under an Emergency Use Authorization (EUA). This EUA will remain  in effect (meaning this test can be  used) for the duration of the COVID-19 declaration under Section 564(b)(1) of the Act, 21 U.S.C.section 360bbb-3(b)(1), unless the authorization is terminated  or revoked sooner.       Influenza A by PCR NEGATIVE NEGATIVE Final   Influenza B by PCR NEGATIVE NEGATIVE Final    Comment: (NOTE) The Xpert Xpress SARS-CoV-2/FLU/RSV plus assay is intended as an aid in the diagnosis of influenza from Nasopharyngeal swab specimens and should not be used as a sole basis for treatment. Nasal washings and aspirates are unacceptable for Xpert Xpress SARS-CoV-2/FLU/RSV testing.  Fact Sheet for Patients: BloggerCourse.com  Fact Sheet for Healthcare Providers: SeriousBroker.it  This test is not yet  approved or cleared by the [place]  FDA and has been authorized for detection and/or diagnosis of SARS-CoV-2 by FDA under an Emergency Use Authorization (EUA). This EUA will remain in effect (meaning this test can be used) for the duration of the COVID-19 declaration under Section 564(b)(1) of the Act, 21 U.S.C. section 360bbb-3(b)(1), unless the authorization is terminated or revoked.     Resp Syncytial Virus by PCR NEGATIVE NEGATIVE Final    Comment: (NOTE) Fact Sheet for Patients: BloggerCourse.com  Fact Sheet for Healthcare Providers: SeriousBroker.it  This test is not yet approved or cleared by the [place]  FDA and has been authorized for detection and/or diagnosis of SARS-CoV-2 by FDA under an Emergency Use Authorization (EUA). This EUA will remain in effect (meaning this test can be used) for the duration of the COVID-19 declaration under Section 564(b)(1) of the Act, 21 U.S.C. section 360bbb-3(b)(1), unless the authorization is terminated or revoked.  Performed at [facility], [address] [place], [place] [address]   Culture, blood (routine x 2)     Status: None   Collection Time: 01/14/24  5:00 PM   Specimen: BLOOD  Result Value Ref Range Status   Specimen Description BLOOD BLOOD RIGHT HAND  Final   Special Requests   Final    BOTTLES DRAWN AEROBIC AND ANAEROBIC Blood Culture adequate volume   Culture   Final    NO GROWTH 5 DAYS Performed at [facility], [address], [place], [place] [address]    Report Status 01/19/2024 FINAL  Final  Culture, blood (routine x 2)     Status: None   Collection Time: 01/14/24  5:47 PM   Specimen: BLOOD  Result Value Ref Range Status   Specimen Description BLOOD RIGHT ANTECUBITAL  Final   Special Requests   Final    BOTTLES DRAWN AEROBIC ONLY Blood Culture adequate volume   Culture   Final    NO GROWTH 5 DAYS Performed at [facility], [address],  [place], [place] [address]    Report Status 01/19/2024 FINAL  Final  MRSA Next Gen by PCR, Nasal     Status: None   Collection Time: 01/15/24 12:12 AM   Specimen: Urine, Clean Catch; Nasal Swab  Result Value Ref Range Status   MRSA by PCR Next Gen NOT DETECTED NOT DETECTED Final    Comment: (NOTE) The GeneXpert MRSA Assay (FDA approved for NASAL specimens only), is one component of a comprehensive MRSA colonization surveillance program. It is not intended to diagnose MRSA infection nor to guide or monitor treatment for MRSA infections. Test performance is not FDA approved in patients less than 16 years old. Performed at [facility], [address] [place], [place] [address]   Body fluid culture w Gram Stain     Status: None  Collection Time: 01/15/24  2:06 PM   Specimen: Pleural Fluid  Result Value Ref Range Status   Specimen Description PLEURAL  Final   Special Requests left  Final   Gram Stain NO WBC SEEN NO ORGANISMS SEEN   Final   Culture   Final    NO GROWTH 3 DAYS Performed at [facility], [address] [place], [place] [address]    Report Status 01/18/2024 FINAL  Final  Fungus Culture With Stain     Status: None (Preliminary result)   Collection Time: 01/15/24  2:06 PM   Specimen: Pleural, Left; Pleural Fluid  Result Value Ref Range Status   Fungus Stain Final report  Final    Comment: (NOTE) Performed At: [facility] [address] [place], [place] [id number] Pearlean Botts MD [id number]    Fungus (Mycology) Culture PENDING  Incomplete   Fungal Source PLEURAL  Final    Comment: Performed at [facility], [address], [place], [place] [address]  Fungus Culture Result     Status: None   Collection Time: 01/15/24  2:06 PM  Result Value Ref Range Status   Result 1 Comment  Final    Comment: (NOTE) KOH/Calcofluor preparation:  no fungus observed. Performed At: [facility] [address] [place], [place]  [id number] Pearlean Botts MD [id number]   Culture, blood (Routine X 2) w Reflex to ID Panel     Status: None   Collection Time: 01/16/24 10:50 PM   Specimen: BLOOD RIGHT ARM  Result Value Ref Range Status   Specimen Description BLOOD RIGHT ARM  Final   Special Requests   Final    BOTTLES DRAWN AEROBIC AND ANAEROBIC Blood Culture adequate volume   Culture   Final    NO GROWTH 5 DAYS Performed at [facility], [address] [place], [place] [address]    Report Status 01/21/2024 FINAL  Final  Culture, blood (Routine X 2) w Reflex to ID Panel     Status: None   Collection Time: 01/16/24 10:50 PM   Specimen: BLOOD LEFT HAND  Result Value Ref Range Status   Specimen Description BLOOD LEFT HAND  Final   Special Requests   Final    BOTTLES DRAWN AEROBIC AND ANAEROBIC Blood Culture results may not be optimal due to an inadequate volume of blood received in culture bottles   Culture   Final    NO GROWTH 5 DAYS Performed at [facility], [address], [place], [place] [address]    Report Status 01/21/2024 FINAL  Final    Labs: CBC: Recent Labs  Lab 01/17/24 0035 01/18/24 0802 01/19/24 0325 01/19/24 1809 01/20/24 0254 01/21/24 0402  WBC 17.9* 16.0* 14.9*  --  14.9* 12.8*  NEUTROABS  --   --   --   --   --  9.9*  HGB 9.0* 7.7* 6.7* 8.3* 8.4* 9.0*  HCT 28.3* 24.9* 20.3* 25.4* 25.9* 28.2*  MCV 95.6 99.6 94.9  --  92.2 94.6  PLT 431* 429* 404*  --  364 340   Basic Metabolic Panel: Recent Labs  Lab 01/16/24 0803 01/16/24 2102 01/17/24 0235 01/18/24 0802 01/19/24 0325 01/21/24 0402  NA 130* 132* 132* 129* 126* 135  K 4.2 4.3 4.5 4.4 4.4 4.4  CL 100 98 100 98 96* 101  CO2 21* 19* 21* 22 22 26   GLUCOSE 174* 199* 226* 139* 128* 150*  BUN 10 13 16  27* 32* 13  CREATININE 0.73 1.07 1.16 0.99 0.91 0.75  CALCIUM  8.7* 8.4* 8.5* 8.1* 8.2* 8.4*  MG 2.0  --   --  2.1 2.1  --   PHOS 3.0  --   --  2.4* 2.3*  --    Liver Function Tests: Recent Labs  Lab  01/15/24 1513  PROT 6.2*   CBG: Recent Labs  Lab 01/21/24 0325 01/21/24 0830 01/21/24 1259 01/21/24 1515 01/21/24 1759  GLUCAP 143* 125* 158* 171* 176*    Discharge time spent: greater than 30 minutes.  Signed: Britaney Espaillat, DO [facility] 01/21/2024

## 2024-01-21 NOTE — Progress Notes (Signed)
 Mobility Specialist Progress Note:    01/21/24 0955  Mobility  Activity Ambulated with assistance in room;Ambulated with assistance in hallway  Level of Assistance Contact guard assist, steadying assist  Assistive Device Front wheel walker  Distance Ambulated (ft) 150 ft  Activity Response Tolerated well  Mobility Referral Yes  Mobility visit 1 Mobility  Mobility Specialist Start Time (ACUTE ONLY) 0955  Mobility Specialist Stop Time (ACUTE ONLY) 1002  Mobility Specialist Time Calculation (min) (ACUTE ONLY) 7 min   Pt received in bed, agreeable to mobility session. Ambulated in hallway with RW. Tolerated well, audible SOB throughout. Monitored O2 sats, see ambulatory O2 sat test note. Returned pt to room, lying comfortably in bed with all needs met.    Tahja Liao Mobility Specialist Please contact via Special educational needs teacher or  Rehab office at [phone]

## 2024-01-21 NOTE — Progress Notes (Signed)
 [name]   DOB:[birth year]   ZO#:[id number]    ASSESSMENT & PLAN:   68 y.o.male with past medical history of hypertension, hyperlipidemia, GERD, metastatic CRPC, PE admitted for loculated pleural effusion and sepsis.    Clinically feeling better. Hgb stable. Cytology positive for malignant cells. Discussed with patient. Considering palliative Pluvicto. Discussed this is not curable but can be managed with treatment if PS allows. He is interested in continuing treatment as able. Will place a referral as outpatient.   Called wife but no answer. Left message. Will follow up in clinic next week. Assessment & Plan Loculated pleural effusion Malignant effusion. Will continue cancer directed treatment as outpatient Prostate cancer metastatic to bone [facility]) Resume Xtandi  History of pulmonary embolism When ready to be discharged, may resume apixaban  at 2.5 mg twice daily Counseled on bleeding precautions  Will follow up with lab next week in clinic.  All questions were answered.    Thank you for the consult. Please call if any questions  Lowanda Ruddy, MD 01/21/2024 8:12 AM  Subjective:  [name] reports feeling better. Some cough. Stable short of breath. No chest pain, stomach pain. No bleeding, bloody stool or hematuria. Able to walk. On oxygen.  Objective:  Vitals:   01/20/24 2308 01/21/24 0300  BP: 105/68 121/63  Pulse: 100 (!) 105  Resp: 20 20  Temp: 97.7 F (36.5 C) 98.7 F (37.1 C)  SpO2: 97% 95%     Intake/Output Summary (Last 24 hours) at 01/21/2024 9811 Last data filed at 01/21/2024 9147 Gross per 24 hour  Intake --  Output 3025 ml  Net -3025 ml    GENERAL: alert, no distress and comfortable SKIN: skin color normal EYES: sclera clear LUNGS: No wheeze, rales and clear to auscultation bilaterally with normal breathing effort. On oxygen HEART: regular rate & rhythm  ABDOMEN: abdomen soft, non-tender  Musculoskeletal: trace bilateral lower extremity edema NEURO:  alert with fluent speech   Labs:  Recent Labs    12/01/23 1917 12/01/23 2104 12/17/23 0903 12/23/23 1515 01/14/24 1200 01/15/24 0842 01/15/24 1513 01/16/24 0803 01/18/24 0802 01/19/24 0325 01/21/24 0402  NA 138   < > 135   < > 129*   < >  --    < > 129* 126* 135  K 4.2   < > 4.3   < > 4.1   < >  --    < > 4.4 4.4 4.4  CL 102   < > 99   < > 96*   < >  --    < > 98 96* 101  CO2 22   < > 29   < > 20*   < >  --    < > 22 22 26   GLUCOSE 102*   < > 107*   < > 134*   < >  --    < > 139* 128* 150*  BUN 17   < > 13   < > 14   < >  --    < > 27* 32* 13  CREATININE 0.70   < > 0.80   < > 0.90   < >  --    < > 0.99 0.91 0.75  CALCIUM  9.5   < > 9.9   < > 8.9   < >  --    < > 8.1* 8.2* 8.4*  GFRNONAA >60   < > >60  --  >60   < >  --    < > >  60 >60 >60  PROT 7.8  --  7.7  --  7.5  --  6.2*  --   --   --   --   ALBUMIN 3.5  --  4.1  --  2.8*  --   --   --   --   --   --   AST 24  --  19  --  28  --   --   --   --   --   --   ALT 13  --  10  --  20  --   --   --   --   --   --   ALKPHOS 60  --  60  --  82  --   --   --   --   --   --   BILITOT 0.5  --  0.4  --  0.3  --   --   --   --   --   --   BILIDIR  --   --   --   --  0.1  --   --   --   --   --   --   IBILI  --   --   --   --  0.2*  --   --   --   --   --   --    < > = values in this interval not displayed.    Studies:  CT CHEST W CONTRAST Result Date: 01/19/2024 CLINICAL DATA:  Pleural effusion, known or suspected (Ped 0-17y) empyema vs hemothorax left pleural space. EXAM: CT CHEST WITH CONTRAST TECHNIQUE: Multidetector CT imaging of the chest was performed during intravenous contrast administration. RADIATION DOSE REDUCTION: This exam was performed according to the departmental dose-optimization program which includes automated exposure control, adjustment of the mA and/or kV according to patient size and/or use of iterative reconstruction technique. CONTRAST:  75mL OMNIPAQUE  IOHEXOL  350 MG/ML SOLN COMPARISON:  Noncontrast chest CT 2 days  ago, additional prior exams reviewed including PET CT 12/31/2023 FINDINGS: Cardiovascular: The heart is normal in size. Small pericardial fluid. Aortic atherosclerosis without acute aortic findings. There is no central pulmonary embolus on this exam not tailored to pulmonary artery CIS mint. Mediastinum/Nodes: Stable mediastinal and left hilar adenopathy. Representative highest mediastinal node measures 11 mm series 2, image 40. patulous esophagus. Lungs/Pleura: Small partially loculated pleural effusion. There is no significant pleural enhancement. Fluid appears to be simple in density. Small amount of fluid tracks into the inter lobar fissure. No previous pleural catheter has been removed. Left upper lobe paramediastinal consolidation and ground-glass, without significant change from recent CT, but mild progression from PET earlier this month. There is ground-glass and septal thickening within the left lower lobe. Emphysema with areas of subpleural reticulation in both lungs. There is no right pleural effusion Upper Abdomen: No acute upper abdominal findings. Musculoskeletal: No acute findings. Degenerative change in the spine. IMPRESSION: 1. Small partially loculated left pleural effusion, not significantly changed in size after removal of pleural catheter. Fluid appears to be simple in density. No convincing pleural enhancement. Recommend correlation with fluid sampling. 2. Left upper lobe paramediastinal consolidation and ground-glass, without significant change from CT 2 days ago, but mild progression from PET earlier this month. Differential considerations remain infection, neoplasm or drug reaction. 3. Stable mediastinal and left hilar adenopathy. Aortic Atherosclerosis (ICD10-I70.0) and Emphysema (ICD10-J43.9). Electronically Signed   By: Chadwick Colonel M.D.   On:  01/19/2024 19:52   DG CHEST PORT 1 VIEW Result Date: 01/19/2024 CLINICAL DATA:  Hemothorax EXAM: PORTABLE CHEST 1 VIEW COMPARISON:   01/17/2024 FINDINGS: Continued left perihilar and basilar airspace opacity with blunting of the left costophrenic angle favoring pleural effusion. The left pleural drainage catheter is no longer visible. There is new pleural based density at the left lung apex probably representing a loculated component of pleural effusion. Low lung volumes are present, causing crowding of the pulmonary vasculature. Mild atelectasis or scarring at the right lung base. Obscured left heart border. Degenerative glenohumeral arthropathy on the left. IMPRESSION: 1. Continued left perihilar and basilar airspace opacity with blunting of the left costophrenic angle favoring pleural effusion. 2. New pleural based density at the left lung apex probably representing a loculated component of pleural effusion. 3. Low lung volumes are present, causing crowding of the pulmonary vasculature. 4. Mild atelectasis or scarring at the right lung base. Electronically Signed   By: Freida Jes M.D.   On: 01/19/2024 17:46   CT CHEST WO CONTRAST Result Date: 01/18/2024 CLINICAL DATA:  Pleural effusion, new no suspected, chest 2 EXAM: CT CHEST WITHOUT CONTRAST TECHNIQUE: Multidetector CT imaging of the chest was performed following the standard protocol without IV contrast. RADIATION DOSE REDUCTION: This exam was performed according to the departmental dose-optimization program which includes automated exposure control, adjustment of the mA and/or kV according to patient size and/or use of iterative reconstruction technique. COMPARISON:  Three days ago FINDINGS: Cardiovascular: Normal heart sounds. Small volume pericardial fluid which is similar to prior. No acute vascular finding. Scattered atheromatous calcification. Mediastinum/Nodes: Unchanged moderate enlargement of lymph nodes in the prevascular and central mediastinum. Unchanged reticulation of mediastinal fat the lower heart. Lungs/Pleura: Significant decrease in left pleural effusion with  mild residual seen the lower major fissure. There is question of nodular thickening along the left pleura, but varies measuring water  density with no activity on recent PET CT in these areas, can correlate with pleural fluid labs. Small bore catheter is in good position at the left posterior pleural cavity. Reticulation in the bilateral lungs (patchy ground-glass opacity on recent imaging) and paramediastinal consolidative type opacity in the left upper lobe. There is a degree chronic lung disease with paraseptal emphysema, although fairly well aerated lungs at PET CT 09/07/2023. Upper Abdomen: Negative Musculoskeletal: No acute finding IMPRESSION: Nearly resolved left pleural effusion with chest tube remaining in place at the lateral left base. Nodular appearance along the left pleural surface has no correlate on recent PET CT, correlate with pleural fluid labs. No progression pulmonary opacity and mediastinal adenopathy-infection, malignancy, and drug reaction differential considerations based on recent CTs. Electronically Signed   By: Ronnette Coke M.D.   On: 01/18/2024 04:13   DG Chest Port 1 View Result Date: 01/17/2024 CLINICAL DATA:  Acute respiratory failure. EXAM: PORTABLE CHEST 1 VIEW COMPARISON:  01/16/2024 FINDINGS: There is a left-sided pigtail drainage catheter overlying the left lower lung. The left-sided pleural fluid collection is decreased from previous exam. Chronic, bilateral reticular interstitial opacities with a lower lung zone predominance. No significant pneumothorax identified. IMPRESSION: 1. Left-sided pigtail drainage catheter overlying the left lower lung. Decreased left pleural fluid collection. No pneumothorax. 2. Chronic interstitial changes as noted previously. Electronically Signed   By: Kimberley Penman M.D.   On: 01/17/2024 08:49   DG Chest Port 1 View Result Date: 01/16/2024 CLINICAL DATA:  Acute respiratory distress EXAM: PORTABLE CHEST 1 VIEW COMPARISON:  X-ray earlier  01/16/2024 and older. FINDINGS:  Film is rotated to left. Left basilar pigtail catheter is again seen and has slightly retracted compared to the previous x-ray. Persistent left-sided effusion adjacent lung opacity. Masslike fullness of the left lung hilum again seen. Please correlate with prior CTA of the chest. Stable cardiopericardial silhouette. Underinflation with some right basilar opacity again seen. No pneumothorax. Overlapping cardiac leads. Fixation hardware along the lower cervical spine. IMPRESSION: Interval retraction of the pigtail catheter at the left lung base. No pneumothorax. No significant oval change when adjusted for technique otherwise. Electronically Signed   By: Adrianna Horde M.D.   On: 01/16/2024 19:55   DG Chest Port 1 View Result Date: 01/16/2024 CLINICAL DATA:  Chest tube EXAM: PORTABLE CHEST 1 VIEW COMPARISON:  Chest x-ray performed Jan 15, 2024 FINDINGS: A left basilar thoracostomy is present. Persistent opacification of the left lung base. Mild diffuse interstitial prominence. Heart mediastinum are not significantly changed. No pneumothorax. IMPRESSION: 1. Left-sided thoracostomy tube without evidence of pneumothorax. 2. When compared to the prior exam, similar appearance of left basilar consolidation and pleural effusion. 3. No significant interval change. Electronically Signed   By: Reagan Camera M.D.   On: 01/16/2024 08:39   DG CHEST PORT 1 VIEW Result Date: 01/15/2024 CLINICAL DATA:  Left chest tube EXAM: PORTABLE CHEST 1 VIEW COMPARISON:  01/14/2024 FINDINGS: Single frontal view of the chest demonstrates a stable cardiac silhouette. Interval placement of a left chest tube, overlying the medial left lung base. Marked decrease in the multilocular left pleural effusion seen previously. Persistent areas of consolidation are seen within the left perihilar region, most pronounced in the left upper lobe. Stable emphysema. No pneumothorax. No acute bony abnormalities. IMPRESSION: 1.  Marked decrease in the multilocular left pleural effusion after left chest tube placement. 2. Persistent left perihilar consolidation, greatest in the left upper lobe, consistent with pneumonia or atelectasis. Electronically Signed   By: Bobbye Burrow M.D.   On: 01/15/2024 16:18   CT Angio Chest PE W/Cm &/Or Wo Cm Result Date: 01/14/2024 CLINICAL DATA:  Pulmonary embolism EXAM: CT ANGIOGRAPHY CHEST WITH CONTRAST TECHNIQUE: Multidetector CT imaging of the chest was performed using the standard protocol during bolus administration of intravenous contrast. Multiplanar CT image reconstructions and MIPs were obtained to evaluate the vascular anatomy. Multiplanar image (3D post-processing) reconstructions and MIPs were obtained to evaluate the vascular anatomy. RADIATION DOSE REDUCTION: This exam was performed according to the departmental dose-optimization program which includes automated exposure control, adjustment of the mA and/or kV according to patient size and/or use of iterative reconstruction technique. CONTRAST:  75mL OMNIPAQUE  IOHEXOL  350 MG/ML SOLN COMPARISON:  CT of the chest performed December 01, 2023. PET-CT performed Dec 31, 2023 FINDINGS: Cardiovascular: Normal size heart. No pericardial effusion. Three vessel aortic arch. The main pulmonary artery is within normal limits for size. No evidence of pulmonary embolism to the proximal segmental level. Mediastinum/Nodes: Enlarged lymph nodes are present within the mediastinum. A representative lymph node anterior to the thoracic aorta measures 1.4 cm in short axis dimension on image 37 of series 4. Soft tissue thickening is also increased along the left medial pleural surface, left mediastinum and left hilum. Lungs/Pleura: There is a loculated left-sided pleural effusion. This is moderate in size and worst in the left lung base. Consolidative changes are present in the left lung particularly in the medial left upper lobe and left lower lobe. Underlying changes  of chronic lung disease are present with cystic lucencies which are upper lobe and peripheral predominant. Peribronchial thickening  is also noted. Upper Abdomen: No acute abnormality. Musculoskeletal: No chest wall abnormality. No acute or significant osseous findings. Review of the MIP images confirms the above findings. IMPRESSION: 1. Increase in abnormal soft tissue thickening along the medial pleural surface of the left lung. 2. Moderate loculated left pleural effusion. 3. Increasing mediastinal lymphadenopathy. 4. No evidence of pulmonary embolism to the proximal segmental level. 5. Scattered consolidative changes are also present in the left lung which may represent superimposed infection. Electronically Signed   By: Reagan Camera M.D.   On: 01/14/2024 16:56   DG Chest Port 1 View Result Date: 01/14/2024 CLINICAL DATA:  Shortness of breath, dizziness. EXAM: PORTABLE CHEST 1 VIEW COMPARISON:  December 01, 2023. FINDINGS: Stable cardiomediastinal silhouette. Stable minimal right basilar scarring or subsegmental atelectasis. Increased left basilar opacity is noted concerning for worsening atelectasis or pneumonia with small left pleural effusion. Bony thorax is unremarkable. IMPRESSION: Increased left basilar opacity is noted concerning for worsening atelectasis or pneumonia with small left pleural effusion. Electronically Signed   By: Rosalene Colon M.D.   On: 01/14/2024 13:56   NM PET ([facility]) SKULL TO MID THIGH Result Date: 12/31/2023 CLINICAL DATA:  Prostate carcinoma with biochemical recurrence. PSA equal 6.8 EXAM: NUCLEAR MEDICINE PET SKULL BASE TO THIGH TECHNIQUE: 8.2 mCi Flotufolastat (Posluma ) was injected intravenously. Full-ring PET imaging was performed from the skull base to thigh after the radiotracer. CT data was obtained and used for attenuation correction and anatomic localization. COMPARISON:  [facility] PET scan 09/07/2023 FINDINGS: NECK No radiotracer activity in neck lymph nodes. Incidental CT  finding: None. CHEST New perihilar consolidation in the LEFT upper lobe. New LEFT pleural effusion. Paraseptal emphysema in the medial LEFT upper lobe is also increased. These findings have progressed rapidly from CT January. New enlarged prevascular lymph nodes. For example 15 mm node image 70/4 with SUV max equal 9.6. Consolidation and peribronchial thickening in the medial LEFT upper lobe has radiotracer activity SUV max equal 7.8. Small RIGHT supraclavicular node with SUV max equal 5.0 on image 51. Incidental CT finding: None. ABDOMEN/PELVIS Prostate: No focal activity in the prostate bed. Lymph nodes: No radiotracer avid pelvic adenopathy. Periaortic retroperitoneal lymph nodes on the LEFT are decreased in activity. For example node LEFT of the aorta on image 135 with SUV max equal 8.9 compared SUV max equal 31.4. Node measures 8 mm compared to 11 mm. Intense radiotracer activity associated with a node dorsal to the pancreatic head with SUV max equal 26.5 (image 113) decreased from SUV max equal 42. This node measures 11 mm short axis compared to 11 mm for no change. No new adenopathy. Liver: No evidence of liver metastasis. Incidental CT finding: None. SKELETON Lesion in the RIGHT acetabulum with SUV max equal 13.8 compared SUV max equal 9.3 on image 192. New skeletal lesion at the skull base on the LEFT with SUV max equal 21.6 on image 14 just anterior to the external auditory canal. IMPRESSION: 1. New perihilar consolidation in the LEFT upper lobe with radiotracer activity. New radiotracer avid prevascular lymph nodes. New pleural effusion. Differential include unusual pattern for metastatic prostate carcinoma versus nonspecific uptake in reactive adenopathy associated pneumonia or drug reaction. Unusual progression to paraseptal emphysema. Recommend pulmonology consultation. 2. 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 Stable lesion in the RIGHT acetabulum 3. Decreased radiotracer  activity in periaortic retroperitoneal lymph nodes. 4. No evidence of local prostate carcinoma recurrence in the prostate bed. 5. No evidence  of liver metastasis. These results will be called to the ordering clinician or representative by the Radiologist Assistant, and communication documented in the PACS or [employer]. Electronically Signed   By: Deboraha Fallow M.D.   On: 12/31/2023 16:44

## 2024-01-21 NOTE — Addendum Note (Signed)
 Addended by: Janeann Mean on: 01/21/2024 08:26 AM   Modules accepted: Orders

## 2024-01-21 NOTE — Assessment & Plan Note (Signed)
 Resume Xtandi 

## 2024-01-21 NOTE — Progress Notes (Signed)
 Christophe Cram to be D/C'd Home per MD order.  Discussed with the patient and all questions fully answered.  VSS, Skin clean, dry and intact without evidence of skin break down, no evidence of skin tears noted. IV catheter discontinued intact. Site without signs and symptoms of complications. Dressing and pressure applied.  An After Visit Summary was printed and given to the patient. Patient received prescription.  D/c education completed with patient/family including follow up instructions, medication list, d/c activities limitations if indicated, with other d/c instructions as indicated by MD - patient able to verbalize understanding, all questions fully answered.   Patient instructed to return to ED, call 911, or call MD for any changes in condition.   Patient escorted home VIA [facility] 01/21/2024 6:39 PM

## 2024-01-21 NOTE — Plan of Care (Signed)

## 2024-01-21 NOTE — Assessment & Plan Note (Signed)
 Malignant effusion. Will continue cancer directed treatment as outpatient

## 2024-01-21 NOTE — Assessment & Plan Note (Signed)
 When ready to be discharged, may resume apixaban  at 2.5 mg twice daily Counseled on bleeding precautions

## 2024-01-21 NOTE — TOC Initial Note (Signed)
 Transition of Care [facility]) - Initial/Assessment Note    Patient Details  [name] MRN: [id number] Date of Birth: 1956-03-01  Transition of [facility]) CM/SW Contact:    Cosimo Diones, RN Phone Number: 01/21/2024, 1:51 PM  Clinical Narrative: Case Manager covering this unit today. Patient in need of home health services. Plan will be to transition home today. Case Manager spoke with spouse and she asks for hh services. Family has no preference- Referral submitted to [facility] and spouse wants to use [employer] for DME oxygen. Patient will need [facility] for transport home. Staff RN will need to call [facility]  @ [phone] once oxygen has been delivered to the home. Paperwork on the unit for [facility] has been completed. No further needs identified at this time.               Expected Discharge Plan: Home w Home Health Services Barriers to Discharge: No Barriers Identified   Patient Goals and CMS Choice Patient states their goals for this hospitalization and ongoing recovery are:: plan to transition home once stable   Choice offered to / list presented to : Spouse (spouse did not have a preference for agency)     Expected Discharge Plan and Services   Discharge Planning Services: CM Consult Post Acute Care Choice: Home Health Living arrangements for the past 2 months: Single Family Home Expected Discharge Date: 01/21/24               DME Arranged: Oxygen DME Agency: [facility] Date DME Agency Contacted: 01/21/24 Time DME Agency Contacted: 1350 Representative spoke with at DME Agency: Zack HH Arranged: PT, OT HH Agency: [facility] Date [facility] Agency Contacted: 01/21/24 Time HH Agency Contacted: 1350 Representative spoke with at [facility] Agency: Bartholomew Light  Prior Living Arrangements/Services Living arrangements for the past 2 months: Single Family Home Lives with:: Spouse Patient language and need for interpreter reviewed:: Yes Do you feel safe going back to the  place where you live?: Yes      Need for Family Participation in Patient Care: Yes (Comment) Care giver support system in place?: Yes (comment) Current home services: DME (oxygen) Criminal Activity/Legal Involvement Pertinent to Current Situation/Hospitalization: No - Comment as needed  Activities of Daily Living   ADL Screening (condition at time of admission) Independently performs ADLs?: Yes (appropriate for developmental age) Is the patient deaf or have difficulty hearing?: No Does the patient have difficulty seeing, even when wearing glasses/contacts?: No Does the patient have difficulty concentrating, remembering, or making decisions?: Yes  Permission Sought/Granted Permission sought to share information with : Family Supports, Magazine features editor, Case Automotive engineer granted to share info w AGENCY: [facility], [employer]        Emotional Assessment Appearance:: Appears stated age     Orientation: : Oriented to Self, Oriented to Place Alcohol / Substance Use: Not Applicable Psych Involvement: No (comment)  Admission diagnosis:  Hyponatremia [E87.1] Pleural effusion, left [J90] Loculated pleural effusion [J90] Patient Active Problem List   Diagnosis Date Noted   Anticoagulation management encounter 01/21/2024   Pleural effusion, left 01/15/2024   History of pulmonary embolism 01/15/2024   Loculated pleural effusion 01/14/2024   DOE (dyspnea on exertion) 12/23/2023   Pulmonary embolism (HCC) 12/01/2023   Prolonged QT interval 12/01/2023   Port-A-Cath in place 10/29/2023   Low serum vitamin B12 08/31/2023   Genetic testing 05/12/2023   At risk for side effect of medication 04/30/2023  Normocytic anemia 04/30/2023   Prostate cancer metastatic to bone (HCC) 04/29/2023   History of colonic polyps 05/21/2018   Type 2 diabetes mellitus with obesity (HCC) 04/24/2016   GERD (gastroesophageal reflux disease) 04/02/2016   Constipation due to pain  medication therapy 04/02/2016   Incontinence 01/31/2014   Esophageal dysphagia 05/25/2013   Mass of appendix 05/25/2013   Colonic mass 05/25/2013   Tobacco abuse 01/19/2013   Essential hypertension 01/19/2013   Mixed hyperlipidemia 01/19/2013   Obesity (BMI 30-39.9) 01/19/2013   COPD (chronic obstructive pulmonary disease) with chronic bronchitis (HCC) 01/19/2013   Back pain 11/01/2012   PCP:  [name], MD Pharmacy:   [facility] DELIVERY - [address] [place] - [address] [place] [address] Phone: [phone] Fax: [phone]  [facility] [place], [place] - [address] [place] [address] Phone: [phone] Fax: [phone]     Social Drivers of Health (SDOH) Social History: SDOH Screenings   Food Insecurity: No Food Insecurity (01/15/2024)  Housing: Low Risk  (01/15/2024)  Transportation Needs: No Transportation Needs (01/15/2024)  Utilities: Not At Risk (01/15/2024)  Alcohol Screen: Low Risk  (06/01/2023)  Depression (PHQ2-9): Low Risk  (06/01/2023)  Social Connections: Moderately Isolated (01/15/2024)  Tobacco Use: Medium Risk (01/14/2024)     Readmission Risk Interventions     No data to display

## 2024-01-21 NOTE — Progress Notes (Addendum)
 Nurse requested Mobility Specialist to perform oxygen saturation test with pt which includes removing pt from oxygen both at rest and while ambulating.  Below are the results from that testing.     Patient Saturations on Room Air at Rest = spO2 93%  Patient Saturations on Room Air while Ambulating = sp02 79% .  Rested and performed pursed lip breathing for 1 minute with sp02 at 71%.  Patient Saturations on 4 Liters of oxygen while Ambulating = sp02 88%  At end of testing pt left in room on 4  Liters of oxygen.  Reported results to nurse.    Kaytlin Adams Mobility Specialist Please contact via SecureChat or  Rehab office at [phone]  Agree and attest to the findings above. The patient will need to be discharged on 4L O2 by .

## 2024-01-21 NOTE — Progress Notes (Signed)
 Referral placed for Pluvicto.

## 2024-01-21 NOTE — Progress Notes (Signed)
 Discussed case with TRH and Oncology. Cytology in pleural fluid positive for adenocarcinoma, prostate primary.  Will need home oxygen arranged at discharge.  Have arranged for office f/u with pulmonary for MPE.   Ok for discharge from pulmonary perspective.   Louie Rover, MD Pulmonary and Critical Care Medicine [facility] 01/21/2024 11:37 AM Pager: see AMION  If no response to pager, please call critical care on call (see AMION) until 7pm After 7:00 pm call Elink

## 2024-01-21 NOTE — Plan of Care (Signed)
   Problem: Education: Goal: Knowledge of General Education information will improve Description Including pain rating scale, medication(s)/side effects and non-pharmacologic comfort measures Outcome: Progressing

## 2024-01-22 ENCOUNTER — Other Ambulatory Visit: Payer: Self-pay

## 2024-01-22 ENCOUNTER — Telehealth: Payer: Self-pay

## 2024-01-22 DIAGNOSIS — C7951 Secondary malignant neoplasm of bone: Secondary | ICD-10-CM

## 2024-01-22 NOTE — Transitions of Care (Post Inpatient/ED Visit) (Signed)
 01/22/2024  [name] MRN: [id number] DOB: [birth year]  Today's TOC FU Call Status: Today's TOC FU Call Status:: Successful TOC FU Call Completed TOC FU Call Complete Date: 01/22/24 Patient's Name and Date of Birth confirmed.  Transition Care Management Follow-up Telephone Call Date of Discharge: 01/21/24 Discharge Facility: [facility]) Type of Discharge: Inpatient Admission Primary Inpatient Discharge Diagnosis:: Pleural effusion How have you been since you were released from the hospital?: Better Any questions or concerns?: No  Items Reviewed: Did you receive and understand the discharge instructions provided?: Yes Medications obtained,verified, and reconciled?: Yes (Medications Reviewed) Any new allergies since your discharge?: No Dietary orders reviewed?: Yes Type of Diet Ordered:: Heart Healthy Do you have support at home?: Yes Name of Support/Comfort Primary Source: Warren Lindahl  Medications Reviewed Today: Medications Reviewed Today     Reviewed by Claudene Crystal, RN (Case Manager) on 01/22/24 at 1432  Med List Status: <None>   Medication Order Taking? Sig Documenting Provider Last Dose Status Informant  albuterol  (PROVENTIL ) (2.5 MG/3ML) 0.083% nebulizer solution [id number]  Take 3 mLs (2.5 mg total) by nebulization every 2 (two) hours as needed for wheezing. Swayze, Ava, DO  Active   apixaban  (ELIQUIS ) 2.5 MG TABS tablet [id number]  Take 1 tablet (2.5 mg total) by mouth 2 (two) times daily. Swayze, Ava, DO  Active   ARIPiprazole  (ABILIFY ) 10 MG tablet [id number] No Take 10 mg by mouth at bedtime. [provider] 01/13/2024 Bedtime Active Self  Choline Fenofibrate  135 MG capsule [id number] No Take 135 mg by mouth at bedtime. trilipix [provider] 01/13/2024 Bedtime Active Self  Coenzyme Q10 (CO Q 10) 100 MG CAPS [id number] No Take 100 mg by mouth every evening. [provider] 01/13/2024 Bedtime Active Self  enzalutamide  (XTANDI ) 40  MG capsule [id number] No Take 4 capsules (160 mg total) by mouth daily. Takes 4 capsules daily Lowanda Ruddy, MD 01/14/2024 10:30 AM Active Self  famotidine  (PEPCID ) 20 MG tablet [id number] No One after supper  Patient taking differently: Take 20 mg by mouth daily with supper. One after supper   Diamond Formica, MD 01/13/2024 Evening Active Self  feeding supplement (ENSURE ENLIVE / ENSURE PLUS) LIQD [id number]  Take 237 mLs by mouth 2 (two) times daily between meals. Swayze, Ava, DO  Active   ferrous sulfate 325 (65 FE) MG tablet [id number] No Take 325 mg by mouth daily.  [provider] Past Week Active Self           Med Note Kolleen Perone   Fri Jun 25, 2018 12:08 PM)    furosemide (LASIX) 20 MG tablet [id number] No Take 20 mg by mouth daily. [provider] 01/13/2024 Evening Active Self  hydroxychloroquine (PLAQUENIL) 200 MG tablet [id number] No Take 200 mg by mouth 2 (two) times daily. [provider] 01/14/2024 Morning Active Self  Multiple Vitamin (MULTIVITAMIN WITH MINERALS) TABS tablet [id number] No Take 1 tablet by mouth daily. [provider] 01/14/2024 Morning Active Self  pantoprazole  (PROTONIX ) 40 MG tablet [id number] No TAKE 1 TABLET DAILY 30 MINUTES BEFORE BREAKFAST  Patient taking differently: Take 40 mg by mouth daily before breakfast.   Eustacio Highman, NP 01/14/2024 Morning Active Self  rosuvastatin  (CRESTOR ) 40 MG tablet [id number] No TAKE 1 TABLET DAILY  Patient taking differently: Take 40 mg by mouth every evening.   Croitoru, Mihai, MD 01/13/2024 Evening Active Self  venlafaxine XR (EFFEXOR-XR) 75 MG 24 hr capsule [id number] No Take 225 mg by  mouth daily with breakfast. [provider] 01/14/2024 Morning Active Self           Med Note Kolleen Perone   Fri Jun 25, 2018 12:09 PM)              Home Care and Equipment/Supplies: Were Home Health Services Ordered?: Yes Name of Home Health Agency:: [facility] Has Agency set up a time to  come to your home?: Yes First Home Health Visit Date: 01/22/24 Any new equipment or medical supplies ordered?: Yes (Nebulizer) Name of Medical supply agency?: [facility] Were you able to get the equipment/medical supplies?: No Do you have any questions related to the use of the equipment/supplies?: No  Functional Questionnaire: Do you need assistance with bathing/showering or dressing?: No Do you need assistance with meal preparation?: Yes (His spouse assist) Do you need assistance with eating?: No Do you have difficulty maintaining continence: No Do you need assistance with getting out of bed/getting out of a chair/moving?: No Do you have difficulty managing or taking your medications?: Yes (His spouse provides assistance with setup and reminders)  Follow up appointments reviewed: PCP Follow-up appointment confirmed?: NA Specialist Hospital Follow-up appointment confirmed?: Yes Date of Specialist follow-up appointment?: 01/28/24 Follow-Up Specialty Provider:: Dr. [name] Do you need transportation to your follow-up appointment?: No Do you understand care options if your condition(s) worsen?: Yes-patient verbalized understanding  SDOH Interventions Today    Flowsheet Row Most Recent Value  SDOH Interventions   Food Insecurity Interventions Intervention Not Indicated  Housing Interventions Intervention Not Indicated  Transportation Interventions Intervention Not Indicated  Utilities Interventions Intervention Not Indicated       Gareld June, BSN, RN [place]  VBCI - [facility] RN Care Manager [phone]

## 2024-01-27 NOTE — Progress Notes (Unsigned)
 [facility] OFFICE PROGRESS NOTE  Patient Care Team: [name], MD as PCP - General (Internal Medicine) Luana Rumple, MD as PCP - Cardiology (Cardiology) Millicent Ally, MD as PCP - Sleep Medicine (Cardiology) Katheleen Palmer, RN as Oncology Nurse Navigator  [name] is a very pleasant 68 year old male with history of hypertension, hyperlipidemia, COPD, prostate cancer being follow-up for prostate cancer.  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    Small volume progression after recent radiation. completed sipuleucel-T .  Tolerating well so far.  May proceed weekly, remove central line after third infusion.   Diagnosis: M1 CRPC Germline: Ambry. NEG Somatic: TMB 2.7 m/MB. MSI high not detected. No actionable mutation. Previous treatment:  2007 prostatectomy  May 2010 salvage radiation.   08/2018- current ADT/ARPI.  10-06/2023 Palliative radiation to oliogoprogression to lymph nodes in the mediastinum and abdomen 10/2023 sipuleucel-T   Now with pleural effusion showed metastatic carcinoma, lymphadenopathy and skeletal lesion at the skull base on the LEFT. Will start Pluvicto later this month.  Return before pluvicto for lab, and transfusion if needed. Assessment & Plan Prostate cancer metastatic to bone [facility]) Proceed with Pluvicto May stop enza once start Pluvicto At risk for side effect of medication Bone health Taking vitamin D 1000 - 2000 IUs and calcium  supplement 1000 mg-1200 mg daily Zometa. No teeth Aggressive cardiovascular risk management to control diabetes, hypertension and heart disease. He will continue his medications through PCP. Normocytic anemia One U of pRBC schedule for 6/13 CBC, SBB ordered. History of pulmonary embolism On apixaban . Discuss precautions and stop immediately if having s/o active bleeding. Proceed to ED if having active bleeding. Anticoagulation management encounter On apixaban . Discuss precautions and stop immediately if having s/o active  bleeding. Proceed to ED if having active bleeding.  Orders Placed This Encounter  Procedures   CBC with Differential (Cancer Center Only)    Standing Status:   Future    Expiration Date:   01/27/2025   CMP (Cancer Center only)    Standing Status:   Future    Expiration Date:   01/27/2025   Testosterone     Standing Status:   Future    Expiration Date:   01/27/2025   Prostate-Specific AG, Serum    Standing Status:   Future    Expiration Date:   01/27/2025   Sample to Blood Bank    Standing Status:   Standing    Number of Occurrences:   33    Expiration Date:   01/27/2025     Lowanda Ruddy, MD  INTERVAL HISTORY: Patient returns for follow-up. Just started PT yesterday at home. Fatigue easily. No chest pain. Some short of breath about the same, on exertion. No bloody stool or melena. No stomach pain.  Oncology History  Prostate cancer metastatic to bone [facility])  2000 Initial Diagnosis   Initial diagnosis in [place], [place]   11/2005 Surgery   Robotic radical prostatectomy by Dr. Kristeen Peto. pT3a N0.  Positive margin Gleason 3+4 = 7 adenocarcinoma   07/2008 Progression   PSA 0.11. salvage radiation.  Completed in May 2010. (Dr. Lorri Rota)   2014 Progression   Found in the appendiceal mass undergoing laparoscopy by general surgery.  Pathology showed solitary intraperitoneal metastases from prostate cancer   07/2013 - 04/2017 Chemotherapy   Started intermittent ADT   02/05/2018 Tumor Marker   PSA 3.77   05/19/2018 Tumor Marker   PSA 5.26   08/23/2018 Tumor Marker   PSA 6.22   08/2018 Progression  PSA increased to 6.22 eventually. CT showed multiple sclerotic bone lesions in the spine, sacrum, pelvis and bone scan with uptake in the right ischium and multiple ribs.   08/2018 -  Chemotherapy   He started enzalutamide . PSA reached nadir <0.2.   03/15/2020 Imaging   Bone scan 1. Resolution of focal radiotracer activity in the right anterior fourth rib and left sacral ala since prior  study. 2. Persistent asymmetric activity involving the distal clavicle right greater than left, nonspecific. While metastatic disease cannot be excluded, degenerative changes could also give this pattern. 3. Degenerative type activity of the bilateral glenohumeral joints, cervical spine, and lumbar spine   04/05/2021 Tumor Marker   PSA 0.11   07/17/2021 Tumor Marker   PSA 0.19 Testosterone  13.3   2023 Progression   Rising PSA   09/20/2021 Tumor Marker   PSA 0.26 Testosterone  20.9   12/16/2021 Tumor Marker   PSA 0.57 Testosterone  25.3   03/26/2022 Tumor Marker   PSA 1.17 Testosterone  14.1   06/2022 Imaging   PSMA PET: Positive abdominal lymph nodes mildly improved compared to prior conventional imaging 2020.   06/25/2022 Tumor Marker   PSA 1.36 Testosterone  13.9   10/01/2022 Tumor Marker   PSA 3.7 Testosterone  16.1   01/05/2023 Tumor Marker   PSA 4.2 Testosterone  17.6   03/2023 Tumor Marker   PSA 7.18   04/29/2023 Cancer Staging   Staging form: Prostate, AJCC 8th Edition - Clinical: Stage IVB (cT3a, cN0, cM1b, Grade Group: 2) - Signed by Lowanda Ruddy, MD on 04/29/2023 Gleason score: 7 Histologic grading system: 5 grade system    Genetic Testing   Ambry testing. Negative for HRR mutation Tempus Liquid: Negative for HRR mutation   05/12/2023 Imaging   PSMA PET A high left posterior mediastinal node measures 7 mm and a S.U.V. max of 3.6 on 54/4. This measured 5 mm and was not tracer avid previously. A subcarinal node measures 8 mm and a S.U.V. max of 3.4 on 83/3. Compare 7 mm and not tracer avid on the prior. Lymph nodes: Porta hepatis nodes of up to 1.7 cm and a S.U.V. max of 36.6 on 114/4. Compare 9 mm and a S.U.V. max of 21.4 on the prior. Left periaortic node measures 1.3 cm and a S.U.V. max of 35.3 on 134/4 versus 6 mm and a S.U.V. max of 4.5 on the prior. New tracer avid abdominal retroperitoneal node in the preaortic space at 4 mm and a S.U.V. max of 6.7 on  143/4.   Liver: No evidence of liver metastasis.  No focal activity to suggest skeletal metastasis. Lumbar spine fixation. Cervical spine fixation.   06/22/2023 - 07/03/2023 Radiation Therapy   ultrahypofractionated radiotherapy (UHRT) mediastinal and intraabdominal LNs   07/29/2023 -  Chemotherapy   Eligard  22.5 mg  PSA 15.8   09/07/2023 PET scan   PSMA PET 1. Stable intensely radiotracer avid retroperitoneal and periportal lymph nodes consistent with prostate carcinoma nodal metastasis. No interval change. 2. No evidence of local recurrence in the prostatectomy bed. 3. No evidence of visceral metastasis. 4. Single new small focus of radiotracer activity in the RIGHT acetabulum is concerning oligometastatic skeletal metastasis.   10/22/2023 -  Chemotherapy   Patient is on Treatment Plan : PROSTATE Sipuleucel-T  q14d        PHYSICAL EXAMINATION: ECOG PERFORMANCE STATUS: 2 - Symptomatic, <50% confined to bed  Vitals:   01/28/24 1447  BP: 107/71  Pulse: (!) 102  Resp: 19  Temp: (!) 97.3  F (36.3 C)  SpO2: 96%   Filed Weights   GENERAL: alert, no distress and on wheelchair SKIN: skin color pale and no jaundice on exposed skin LUNGS: clear to auscultation and percussion with normal breathing effort On oxygen ABDOMEN: abdomen soft, non-tender and nondistended. Musculoskeletal: lower extremity edema bilaterally. No calf tenderness   Relevant data reviewed during this visit included labs and imaging. Pathology reviewed.

## 2024-01-28 ENCOUNTER — Inpatient Hospital Stay (HOSPITAL_BASED_OUTPATIENT_CLINIC_OR_DEPARTMENT_OTHER)

## 2024-01-28 ENCOUNTER — Inpatient Hospital Stay

## 2024-01-28 ENCOUNTER — Other Ambulatory Visit: Payer: Self-pay

## 2024-01-28 ENCOUNTER — Encounter (HOSPITAL_COMMUNITY)
Admission: RE | Admit: 2024-01-28 | Discharge: 2024-01-28 | Disposition: A | Discharge status: Home / Self Care | Source: Ambulatory Visit

## 2024-01-28 VITALS — BP 107/71 | HR 102 | Temp 97.3°F | Resp 19

## 2024-01-28 DIAGNOSIS — Z5181 Encounter for therapeutic drug level monitoring: Secondary | ICD-10-CM

## 2024-01-28 DIAGNOSIS — Z95828 Presence of other vascular implants and grafts: Secondary | ICD-10-CM

## 2024-01-28 DIAGNOSIS — Z79899 Other long term (current) drug therapy: Secondary | ICD-10-CM | POA: Diagnosis not present

## 2024-01-28 DIAGNOSIS — C61 Malignant neoplasm of prostate: Secondary | ICD-10-CM | POA: Diagnosis not present

## 2024-01-28 DIAGNOSIS — C459 Mesothelioma, unspecified: Secondary | ICD-10-CM | POA: Diagnosis not present

## 2024-01-28 DIAGNOSIS — I10 Essential (primary) hypertension: Secondary | ICD-10-CM | POA: Diagnosis not present

## 2024-01-28 DIAGNOSIS — Z86711 Personal history of pulmonary embolism: Secondary | ICD-10-CM | POA: Diagnosis not present

## 2024-01-28 DIAGNOSIS — E785 Hyperlipidemia, unspecified: Secondary | ICD-10-CM | POA: Insufficient documentation

## 2024-01-28 DIAGNOSIS — Z9981 Dependence on supplemental oxygen: Secondary | ICD-10-CM | POA: Diagnosis not present

## 2024-01-28 DIAGNOSIS — Z7901 Long term (current) use of anticoagulants: Secondary | ICD-10-CM | POA: Insufficient documentation

## 2024-01-28 DIAGNOSIS — Z87891 Personal history of nicotine dependence: Secondary | ICD-10-CM | POA: Insufficient documentation

## 2024-01-28 DIAGNOSIS — C7951 Secondary malignant neoplasm of bone: Secondary | ICD-10-CM | POA: Diagnosis not present

## 2024-01-28 DIAGNOSIS — Z9189 Other specified personal risk factors, not elsewhere classified: Secondary | ICD-10-CM

## 2024-01-28 DIAGNOSIS — J90 Pleural effusion, not elsewhere classified: Secondary | ICD-10-CM | POA: Insufficient documentation

## 2024-01-28 DIAGNOSIS — Z923 Personal history of irradiation: Secondary | ICD-10-CM | POA: Insufficient documentation

## 2024-01-28 DIAGNOSIS — I700 Atherosclerosis of aorta: Secondary | ICD-10-CM | POA: Diagnosis not present

## 2024-01-28 DIAGNOSIS — C778 Secondary and unspecified malignant neoplasm of lymph nodes of multiple regions: Secondary | ICD-10-CM | POA: Diagnosis not present

## 2024-01-28 DIAGNOSIS — Z5111 Encounter for antineoplastic chemotherapy: Secondary | ICD-10-CM | POA: Insufficient documentation

## 2024-01-28 DIAGNOSIS — R9721 Rising PSA following treatment for malignant neoplasm of prostate: Secondary | ICD-10-CM | POA: Diagnosis not present

## 2024-01-28 DIAGNOSIS — G893 Neoplasm related pain (acute) (chronic): Secondary | ICD-10-CM | POA: Diagnosis not present

## 2024-01-28 DIAGNOSIS — D649 Anemia, unspecified: Secondary | ICD-10-CM | POA: Diagnosis not present

## 2024-01-28 DIAGNOSIS — G4730 Sleep apnea, unspecified: Secondary | ICD-10-CM | POA: Insufficient documentation

## 2024-01-28 DIAGNOSIS — J439 Emphysema, unspecified: Secondary | ICD-10-CM | POA: Diagnosis not present

## 2024-01-28 LAB — CMP (CANCER CENTER ONLY)
ALT: 16 U/L (ref 0–44)
AST: 20 U/L (ref 15–41)
Albumin: 3.2 g/dL — ABNORMAL LOW (ref 3.5–5.0)
Alkaline Phosphatase: 80 U/L (ref 38–126)
Anion gap: 5 (ref 5–15)
BUN: 11 mg/dL (ref 8–23)
CO2: 32 mmol/L (ref 22–32)
Calcium: 9.1 mg/dL (ref 8.9–10.3)
Chloride: 95 mmol/L — ABNORMAL LOW (ref 98–111)
Creatinine: 0.6 mg/dL — ABNORMAL LOW (ref 0.61–1.24)
GFR, Estimated: >60 mL/min (ref >60)
Glucose, Bld: 126 mg/dL — ABNORMAL HIGH (ref 70–99)
Potassium: 3.8 mmol/L (ref 3.5–5.1)
Sodium: 132 mmol/L — ABNORMAL LOW (ref 135–145)
Total Bilirubin: 0.4 mg/dL (ref 0.0–1.2)
Total Protein: 6.9 g/dL (ref 6.5–8.1)

## 2024-01-28 LAB — CBC WITH DIFFERENTIAL (CANCER CENTER ONLY)
Abs Immature Granulocytes: 0.17 10*3/uL — ABNORMAL HIGH (ref 0.00–0.07)
Basophils Absolute: 0 10*3/uL (ref 0.0–0.1)
Basophils Relative: 0 %
Eosinophils Absolute: 0.2 10*3/uL (ref 0.0–0.5)
Eosinophils Relative: 1 %
HCT: 24.8 % — ABNORMAL LOW (ref 39.0–52.0)
Hemoglobin: 7.9 g/dL — ABNORMAL LOW (ref 13.0–17.0)
Immature Granulocytes: 1 %
Lymphocytes Relative: 3 %
Lymphs Abs: 0.4 10*3/uL — ABNORMAL LOW (ref 0.7–4.0)
MCH: 29.8 pg (ref 26.0–34.0)
MCHC: 31.9 g/dL (ref 30.0–36.0)
MCV: 93.6 fL (ref 80.0–100.0)
Monocytes Absolute: 1.5 10*3/uL — ABNORMAL HIGH (ref 0.1–1.0)
Monocytes Relative: 10 %
Neutro Abs: 12.6 10*3/uL — ABNORMAL HIGH (ref 1.7–7.7)
Neutrophils Relative %: 85 %
Platelet Count: 274 10*3/uL (ref 150–400)
RBC: 2.65 MIL/uL — ABNORMAL LOW (ref 4.22–5.81)
RDW: 17.5 % — ABNORMAL HIGH (ref 11.5–15.5)
WBC Count: 15 10*3/uL — ABNORMAL HIGH (ref 4.0–10.5)
nRBC: 0 % (ref 0.0–0.2)

## 2024-01-28 LAB — SAMPLE TO BLOOD BANK

## 2024-01-28 MED ORDER — LEUPROLIDE ACETATE (3 MONTH) 22.5 MG IM KIT
22.5000 mg | PACK | Freq: Once | INTRAMUSCULAR | Status: AC
  Administered 2024-01-28: 22.5 mg via INTRAMUSCULAR
  Filled 2024-01-28: qty 22.5

## 2024-01-28 NOTE — Assessment & Plan Note (Signed)
 On apixaban . Discuss precautions and stop immediately if having s/o active bleeding. Proceed to ED if having active bleeding.

## 2024-01-28 NOTE — Consult Note (Signed)
 Chief Complaint: Patient with metastatic castrate resistant prostate cancer. Evaluation for   Lu 177 PSMA therapy (Pluvicto).  Referring Physician(s): Caralyn Chandler     Patient Status: [facility] - Out-pt  History of Present Illness: [name] is a 68 y.o. male With metastatic castrate resistant prostate carcinoma.  Initial diagnosis in 2007 with subsequent prostatectomy.  Radiation salvage therapy 2010.  Current androgen deprivation therapy ongoing.  Palliative radiation therapy 2 oligo progression lymph nodes in the mediastinum abdomen in October 2024.     Evidence of progression within several sites within the skeleton as well as malignant pleural effusion on PSMA PET scan (12/31/2023).    Recent hospitalization for LEFT pulmonary infection presumed superimposed on LEFT pulmonary malignancy.     Equal 6.8 and mildly rising.  Patient anemic require recent transfusion.    Past Medical History:  Diagnosis Date   Anemia    Anxiety    Arthritis    Bladder stone    Cancer (HCC) 2007   prostate CA   COPD (chronic obstructive pulmonary disease) (HCC)    Decreased pulse 08/16/2008   LE doppler - bilateral ABIs normal, bilateral PVRs normal waveform; bilateral common iliac arteries difficult to visualize however velocities suggest less than 50% diameter reduction; bilateral LE normal velocities w/o evidence of diameter reduction   Depression    GERD (gastroesophageal reflux disease)    Hypercholesteremia    Hypertension    Mental disorder    PTSD   Pre-diabetes    Shortness of breath    Sleep apnea    wears CPAP nightly, settings at 14    Tobacco abuse    Urinary incontinence    due to prostate removal    Past Surgical History:  Procedure Laterality Date   ANTERIOR LAT LUMBAR FUSION N/A 11/17/2012   Procedure: XLIF L3 - L4 POSTERIOR L3 -L4 FUSION INSTRUMENTATION/L2 - L3 DECOMPRESSION (REVISION OF POSTERIOR HARDWARE) 1 LEVEL;  Surgeon: Mort Ards, MD;  Location: [facility] OR;   Service: Orthopedics;  Laterality: N/A;  procedure #1: start 0923, end 1057.    BACK SURGERY  2010 / 2014   BLADDER SUSPENSION     x 2   CARDIAC CATHETERIZATION     no PCI   CARDIOVASCULAR STRESS TEST  08/16/2008   EF 57%; inferior region has diaphragmatic attenuation, otherwise normal perfusion w/o evidence of ischemia or infarct; LV normal in size; no scintographic evidence of inducible ischemia; no ECG changes, negative for ischemia; low risk scan   CERVICAL FUSION     CHEST TUBE INSERTION Left 01/15/2024   Procedure: CHEST TUBE INSERTION;  Surgeon: Denson Flake, MD;  Location: [facility] ENDOSCOPY;  Service: Cardiopulmonary;  Laterality: Left;   COLON RESECTION N/A 07/12/2013   peritoneal biopsy only, evidence of metastatic prostate cancer   COLONOSCOPY     COLONOSCOPY N/A 05/27/2013   RMR: Extrinsic mass effect at the level of the appendiceal orifice likely represents an appendiceal or periappendiceal process. Neovascular changes of the rectal mucosa consistent with prior history of radiation treatment. Multiple 3-5 mm polyps removed, several tubular adenomas. Diverticulosis.nex tcs 2019   COLONOSCOPY WITH PROPOFOL  N/A 07/05/2018   Procedure: COLONOSCOPY WITH PROPOFOL ;  Surgeon: Suzette Espy, MD;  Location: [facility];  Service: Endoscopy;  Laterality: N/A;  9:30am   COLONOSCOPY WITH PROPOFOL  N/A 03/07/2021   Procedure: COLONOSCOPY WITH PROPOFOL ;  Surgeon: Suzette Espy, MD;  Location: [facility];  Service: Endoscopy;  Laterality: N/A;  2:30pm   CYSTOSCOPY  W/ URETERAL STENT PLACEMENT N/A 10/03/2013   Procedure: CYSTOSCOPY AND REMOVAL OF BLADDER NECK STONE ;  Surgeon: Kristeen Peto, MD;  Location: [facility] ORS;  Service: Urology;  Laterality: N/A;  WITH REMOVAL OF BLADDER NECK STONE     ESOPHAGOGASTRODUODENOSCOPY (EGD) WITH ESOPHAGEAL DILATION N/A 05/27/2013   RMR: Mild erosive reflux esophagitis with soft noncritical appearing stricture status post Maloney dilation, antral erosions with reactive  gastropathy but no H. pylori   ESOPHAGOGASTRODUODENOSCOPY (EGD) WITH PROPOFOL  N/A 07/05/2018   Procedure: ESOPHAGOGASTRODUODENOSCOPY (EGD) WITH PROPOFOL ;  Surgeon: Suzette Espy, MD;  [facility]: [facility];  Service: Endoscopy;  Laterality: N/A;   ESOPHAGOGASTRODUODENOSCOPY (EGD) WITH PROPOFOL  N/A 02/05/2023   Procedure: ESOPHAGOGASTRODUODENOSCOPY (EGD) WITH PROPOFOL ;  Surgeon: Suzette Espy, MD;  [facility];  Service: Endoscopy;  Laterality: N/A;  11:30 am, asa 3   HOLMIUM LASER APPLICATION N/A 10/03/2013   Procedure: HOLMIUM LASER APPLICATION;  Surgeon: Kristeen Peto, MD;  Location: [facility];  Service: Urology;  Laterality: N/A;   IR FLUORO GUIDE CV LINE RIGHT  10/08/2023   IR REMOVAL TUN CV CATH W/O FL  11/13/2023   IR US  GUIDE VASC ACCESS RIGHT  10/08/2023   MALONEY DILATION N/A 07/05/2018   Procedure: Londa Rival DILATION;  Surgeon: Suzette Espy, MD;  [facility];  Service: Endoscopy;  Laterality: N/A;   MALONEY DILATION N/A 02/05/2023   Procedure: Londa Rival DILATION;  Surgeon: Suzette Espy, MD;  [facility];  Service: Endoscopy;  Laterality: N/A;   POLYPECTOMY  07/05/2018   Procedure: POLYPECTOMY;  Surgeon: Suzette Espy, MD;  [facility];  Service: Endoscopy;;  cecal polyp , splenic flexure polyps x4   POLYPECTOMY  03/07/2021   Procedure: POLYPECTOMY;  Surgeon: Suzette Espy, MD;  [facility]: [facility];  Service: Endoscopy;;   PROSTATE SURGERY     TRANSTHORACIC ECHOCARDIOGRAM  08/16/2008   essentially normal for age   URINARY SPHINCTER IMPLANT N/A 01/31/2014   Procedure: IMPLANTATION ARTIFICIAL SPHINCTER  WITH CYSTOSCOPY;  Surgeon: Devorah Fonder, MD;  Location: [facility];  Service: Urology;  Laterality: N/A;   WRIST SURGERY Right     Allergies: Penicillins  Medications: Prior to Admission medications   Medication Sig Start Date End Date Taking? Authorizing Provider  albuterol  (PROVENTIL ) (2.5 MG/3ML) 0.083% nebulizer solution Take  3 mLs (2.5 mg total) by nebulization every 2 (two) hours as needed for wheezing. 01/21/24   Swayze, Ava, DO  apixaban  (ELIQUIS ) 2.5 MG TABS tablet Take 1 tablet (2.5 mg total) by mouth 2 (two) times daily. 01/21/24   Swayze, Ava, DO  ARIPiprazole  (ABILIFY ) 10 MG tablet Take 10 mg by mouth at bedtime. 04/13/18   [provider]  Choline Fenofibrate  135 MG capsule Take 135 mg by mouth at bedtime. trilipix    [provider]  Coenzyme Q10 (CO Q 10) 100 MG CAPS Take 100 mg by mouth every evening.    [provider]  enzalutamide  (XTANDI ) 40 MG capsule Take 4 capsules (160 mg total) by mouth daily. Takes 4 capsules daily 12/17/23   Lowanda Ruddy, MD  famotidine  (PEPCID ) 20 MG tablet One after supper Patient taking differently: Take 20 mg by mouth daily with supper. One after supper 12/23/23   Diamond Formica, MD  feeding supplement (ENSURE ENLIVE / ENSURE PLUS) LIQD Take 237 mLs by mouth 2 (two) times daily between meals. 01/22/24   Swayze, Ava, DO  ferrous sulfate  325 (65 FE) MG tablet Take  325 mg by mouth daily.  06/30/14   [provider]  furosemide  (LASIX ) 20 MG tablet Take 20 mg by mouth daily. 11/30/23   [provider]  hydroxychloroquine (PLAQUENIL) 200 MG tablet Take 200 mg by mouth 2 (two) times daily.    [provider]  Multiple Vitamin (MULTIVITAMIN WITH MINERALS) TABS tablet Take 1 tablet by mouth daily.    [provider]  pantoprazole  (PROTONIX ) 40 MG tablet TAKE 1 TABLET DAILY 30 MINUTES BEFORE BREAKFAST Patient taking differently: Take 40 mg by mouth daily before breakfast. 01/07/24   Eustacio Highman, NP  rosuvastatin  (CRESTOR ) 40 MG tablet TAKE 1 TABLET DAILY Patient taking differently: Take 40 mg by mouth every evening. 01/30/23   Croitoru, Mihai, MD  venlafaxine XR (EFFEXOR-XR) 75 MG 24 hr capsule Take 225 mg by mouth daily with breakfast. 10/03/14   [provider]     Family History  Problem Relation Age of Onset    Diabetes Mother    Hypertension Mother    Heart disease Mother    Diabetes Father    Hypertension Father    Colon polyps Father        age 64   Colon polyps Sister    Hypertension Brother    Diabetes Brother    Cancer Paternal Uncle        unknown type   Prostate cancer Cousin 10       maternal first cousin, metastatic   Colon cancer Cousin        paternal first cousin    Social History   Socioeconomic History   Marital status: Married    Spouse name: Not on file   Number of children: Not on file   Years of education: Not on file   Highest education level: Not on file  Occupational History   Occupation: driving truck    Comment: retired  Tobacco Use   Smoking status: Former    Current packs/day: 0.00    Average packs/day: 0.3 packs/day for 43.0 years (10.8 ttl pk-yrs)    Types: Cigarettes    Start date: 02/28/1970    Quit date: 02/28/2013    Years since quitting: 10.9   Smokeless tobacco: Former    Quit date: 06/30/1980  Vaping Use   Vaping status: Never Used  Substance and Sexual Activity   Alcohol use: No   Drug use: No   Sexual activity: Yes    Birth control/protection: None  Other Topics Concern   Not on file  Social History Narrative   Not on file   Social Drivers of Health   Financial Resource Strain: Not on file  Food Insecurity: No Food Insecurity (01/22/2024)   Hunger Vital Sign    Worried About Running Out of Food in the Last Year: Never true    Ran Out of Food in the Last Year: Never true  Transportation Needs: No Transportation Needs (01/22/2024)   PRAPARE - Administrator, Civil Service (Medical): No    Lack of Transportation (Non-Medical): No  Physical Activity: Not on file  Stress: Not on file  Social Connections: Moderately Isolated (01/15/2024)   Social Connection and Isolation Panel [NHANES]    Frequency of Communication with Friends and Family: Three times a week    Frequency of Social Gatherings with Friends and Family: Three  times a week    Attends Religious Services: Never    Active Member of Clubs or Organizations: No    Attends Club or  Organization Meetings: Never    Marital Status: Married    ECOG Status: 2 - Symptomatic, <50% confined to bed  Review of Systems: A 12 point ROS discussed and pertinent positives are indicated in the HPI above.  All other systems are negative.  Review of Systems  Respiratory:  Positive for shortness of breath. Negative for apnea.   Cardiovascular:  Positive for chest pain.  Genitourinary:  Positive for enuresis (incontinence - requires depends).  : Short of breath with minimal exertion.  Recent discharge from hospital with PNA  Vital Signs: There were no vitals taken for this visit.  Physical Exam Patient in wheel chair.  Oxygen supplement.  Short of breath. Imaging: No results found.  IMPRESSION: 1. New perihilar consolidation in the LEFT upper lobe with radiotracer activity. New radiotracer avid prevascular lymph nodes. New pleural effusion. Differential include unusual pattern for metastatic prostate carcinoma versus nonspecific uptake in reactive adenopathy associated pneumonia or drug reaction. Unusual progression to paraseptal emphysema. Recommend pulmonology consultation. 2. 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 Stable lesion in the RIGHT acetabulum 3. Decreased radiotracer activity in periaortic retroperitoneal lymph nodes. 4. No evidence of local prostate carcinoma recurrence in the prostate bed. 5. No evidence of liver metastasis.  Labs: Recent Labs    08/31/23 1209 11/04/23 0936 11/09/23 1019 12/17/23 0903  PSA1 7.5* 5.3* 5.9* 6.8*    CBC: Recent Labs    01/19/24 0325 01/19/24 1809 01/20/24 0254 01/21/24 0402 01/28/24 1423  WBC 14.9*  --  14.9* 12.8* 15.0*  HGB 6.7* 8.3* 8.4* 9.0* 7.9*  HCT 20.3* 25.4* 25.9* 28.2* 24.8*  PLT 404*  --  364 340 274    COAGS: Recent Labs    10/08/23 1000 12/01/23 1917  01/15/24 0842 01/16/24 1837 01/17/24 0033 01/17/24 0955 01/17/24 2051  INR 1.0 1.1  --   --   --   --   --   APTT  --   --    < > 43* 174* >200* 116*   < > = values in this interval not displayed.    BMP: Recent Labs    01/17/24 0235 01/18/24 0802 01/19/24 0325 01/21/24 0402  NA 132* 129* 126* 135  K 4.5 4.4 4.4 4.4  CL 100 98 96* 101  CO2 21* 22 22 26   GLUCOSE 226* 139* 128* 150*  BUN 16 27* 32* 13  CALCIUM  8.5* 8.1* 8.2* 8.4*  CREATININE 1.16 0.99 0.91 0.75  GFRNONAA >60 >60 >60 >60    LIVER FUNCTION TESTS: Recent Labs    11/09/23 1019 12/01/23 1917 12/17/23 0903 01/14/24 1200 01/15/24 1513  BILITOT 0.2 0.5 0.4 0.3  --   AST 17 24 19 28   --   ALT 11 13 10 20   --   ALKPHOS 55 60 60 82  --   PROT 7.3 7.8 7.7 7.5 6.2*  ALBUMIN 4.3 3.5 4.1 2.8*  --     TUMOR MARKERS: PSA = 6.8  mildly increasing   Assessment and Plan:  [Patient is adequate candidate Lu 177 PSMA therapy ( vipivotide tetraxetan).  Patient demonstrates mild-to-moderate progression with metastatic [lymphadenopathy and skeletal disease and pulmonary metastaiss ] identified on recent PSMA PET scan.  Additionally patient's PSA is gradually increasing.  Patient has demonstrated progression on androgen deprivation.  Patient is anemic. Will monitory Hemoglobin closely.  Patient also incontinent. Will educate  radiation safety protocols.    Patient explained major and minor risks and benefits of therapy.  Major benefit being progression-free survival.  Major risk being myelosuppression and renal toxicity. Minor toxicity of xerostomia.  All the patient's questions were answered.  Patient accompanied by daughter who was also present for consult.    Patient is scheduled for 6 treatments spaced 6 weeks apart.  Recommend following up with oncologist for CBC and CMP 1 week prior to each treatment to assess safety of continuing with therapy.      Thank you for this interesting consult.  I greatly enjoyed  meeting DEXTON ZWILLING and look forward to participating in their care.  A copy of this report was sent to the requesting provider on this date.  Electronically Signed: Reino Carbo, MD 01/28/2024, 3:13 PM   I spent a total of  30 Minutes   in face to face in clinical consultation, greater than 50% of which was counseling/coordinating care for metastatic neuroendocrine tumor.

## 2024-01-28 NOTE — Assessment & Plan Note (Signed)
 Proceed with Pluvicto May stop enza once start Pluvicto

## 2024-01-28 NOTE — Assessment & Plan Note (Addendum)
 Bone health Taking vitamin D 1000 - 2000 IUs and calcium  supplement 1000 mg-1200 mg daily Zometa. No teeth Aggressive cardiovascular risk management to control diabetes, hypertension and heart disease. He will continue his medications through PCP.

## 2024-01-28 NOTE — Assessment & Plan Note (Signed)
 One U of pRBC schedule for 6/13 CBC, SBB ordered.

## 2024-01-29 ENCOUNTER — Other Ambulatory Visit: Payer: Self-pay

## 2024-01-29 ENCOUNTER — Telehealth: Payer: Self-pay | Admitting: *Deleted

## 2024-01-29 LAB — TESTOSTERONE: Testosterone: 23 ng/dL — ABNORMAL LOW (ref 264–916)

## 2024-01-29 LAB — PROSTATE-SPECIFIC AG, SERUM (LABCORP): Prostate Specific Ag, Serum: 7.7 ng/mL — ABNORMAL HIGH (ref 0.0–4.0)

## 2024-01-29 NOTE — Telephone Encounter (Signed)
 Pt called checking on PSA. He would like to talk to Dr [name] again to discuss his situation and life expectancy.

## 2024-01-30 NOTE — Addendum Note (Signed)
 Addended by: Janeann Mean on: 01/30/2024 10:03 PM   Modules accepted: Orders

## 2024-02-01 ENCOUNTER — Telehealth: Payer: Self-pay | Admitting: *Deleted

## 2024-02-01 DIAGNOSIS — I10 Essential (primary) hypertension: Secondary | ICD-10-CM

## 2024-02-01 DIAGNOSIS — G4733 Obstructive sleep apnea (adult) (pediatric): Secondary | ICD-10-CM

## 2024-02-01 LAB — CYTOLOGY - NON PAP

## 2024-02-01 NOTE — Telephone Encounter (Signed)
 Per Dr Loetta Ringer, I discussed with him that he qualifies for a new CPAP machine. He wishes to pursue this and I will schedule him to receive a new ResMed AirSense 11 AutoSet unit.  Order placed to [employer] via community message.  Mode AutoSet Min Pressure (cmH2O) 10.0 Max Pressure (cmH2O) 20.0 Response Standard EPR Fulltime EPR level (cmH2O) 3   Ramp enable On Ramp time (min) 5 Start pressure (cmH2O) 8.0

## 2024-02-04 ENCOUNTER — Telehealth: Payer: Self-pay

## 2024-02-04 DIAGNOSIS — R5381 Other malaise: Secondary | ICD-10-CM

## 2024-02-04 DIAGNOSIS — C61 Malignant neoplasm of prostate: Secondary | ICD-10-CM

## 2024-02-04 NOTE — Telephone Encounter (Signed)
 Called and spoke to patient and wife. Discussed path finding concerning for mesothelioma. However having only cytology is not able to give definitive pathology diagnosis. Recommend tissue biopsy.  Spoke to patient he is on 5 L and report short of breath has not improved. Discuss unclear if he can safely go through biopsy. Wife feels want to see a surgeon to see if possible. Will reach out to thoracic surgery.  Also discuss given mCRPC and likelihood of 2nd cancer. Limited PS. Considering palliative/hospice discussion.  Please schedule follow up with me on Monday 11:30 and palliative care after that if able with opening.

## 2024-02-05 ENCOUNTER — Inpatient Hospital Stay

## 2024-02-05 DIAGNOSIS — D649 Anemia, unspecified: Secondary | ICD-10-CM

## 2024-02-05 DIAGNOSIS — C61 Malignant neoplasm of prostate: Secondary | ICD-10-CM

## 2024-02-05 DIAGNOSIS — Z5111 Encounter for antineoplastic chemotherapy: Secondary | ICD-10-CM | POA: Diagnosis not present

## 2024-02-05 LAB — CBC WITH DIFFERENTIAL (CANCER CENTER ONLY)
Abs Immature Granulocytes: 0.16 10*3/uL — ABNORMAL HIGH (ref 0.00–0.07)
Basophils Absolute: 0 10*3/uL (ref 0.0–0.1)
Basophils Relative: 0 %
Eosinophils Absolute: 0.1 10*3/uL (ref 0.0–0.5)
Eosinophils Relative: 0 %
HCT: 26.4 % — ABNORMAL LOW (ref 39.0–52.0)
Hemoglobin: 8.5 g/dL — ABNORMAL LOW (ref 13.0–17.0)
Immature Granulocytes: 1 %
Lymphocytes Relative: 4 %
Lymphs Abs: 0.5 10*3/uL — ABNORMAL LOW (ref 0.7–4.0)
MCH: 29.3 pg (ref 26.0–34.0)
MCHC: 32.2 g/dL (ref 30.0–36.0)
MCV: 91 fL (ref 80.0–100.0)
Monocytes Absolute: 1.5 10*3/uL — ABNORMAL HIGH (ref 0.1–1.0)
Monocytes Relative: 12 %
Neutro Abs: 10.3 10*3/uL — ABNORMAL HIGH (ref 1.7–7.7)
Neutrophils Relative %: 83 %
Platelet Count: 355 10*3/uL (ref 150–400)
RBC: 2.9 MIL/uL — ABNORMAL LOW (ref 4.22–5.81)
RDW: 17.3 % — ABNORMAL HIGH (ref 11.5–15.5)
WBC Count: 12.5 10*3/uL — ABNORMAL HIGH (ref 4.0–10.5)
nRBC: 0 % (ref 0.0–0.2)

## 2024-02-05 LAB — SAMPLE TO BLOOD BANK

## 2024-02-05 NOTE — Progress Notes (Signed)
 Hgb 8.5 Dr [name] notified. Copy of lab work provided to patient. Per Dr [name], cancel infusion appt for today as no need for blood transfusion. Patient aware and agreeable.

## 2024-02-08 ENCOUNTER — Encounter: Payer: Self-pay | Admitting: Nurse Practitioner

## 2024-02-08 ENCOUNTER — Inpatient Hospital Stay (HOSPITAL_BASED_OUTPATIENT_CLINIC_OR_DEPARTMENT_OTHER): Admitting: Nurse Practitioner

## 2024-02-08 ENCOUNTER — Inpatient Hospital Stay (HOSPITAL_BASED_OUTPATIENT_CLINIC_OR_DEPARTMENT_OTHER)

## 2024-02-08 VITALS — BP 98/72 | HR 150 | Temp 97.9°F | Resp 19 | Ht 74.0 in | Wt 237.0 lb

## 2024-02-08 DIAGNOSIS — C61 Malignant neoplasm of prostate: Secondary | ICD-10-CM

## 2024-02-08 DIAGNOSIS — Z515 Encounter for palliative care: Secondary | ICD-10-CM | POA: Diagnosis not present

## 2024-02-08 DIAGNOSIS — Z5111 Encounter for antineoplastic chemotherapy: Secondary | ICD-10-CM | POA: Diagnosis not present

## 2024-02-08 DIAGNOSIS — G893 Neoplasm related pain (acute) (chronic): Secondary | ICD-10-CM | POA: Diagnosis not present

## 2024-02-08 DIAGNOSIS — Z7189 Other specified counseling: Secondary | ICD-10-CM | POA: Diagnosis not present

## 2024-02-08 DIAGNOSIS — C7951 Secondary malignant neoplasm of bone: Secondary | ICD-10-CM | POA: Diagnosis not present

## 2024-02-08 DIAGNOSIS — R530 Neoplastic (malignant) related fatigue: Secondary | ICD-10-CM

## 2024-02-08 DIAGNOSIS — R0602 Shortness of breath: Secondary | ICD-10-CM

## 2024-02-08 DIAGNOSIS — R630 Anorexia: Secondary | ICD-10-CM

## 2024-02-08 MED ORDER — HYDROCODONE-ACETAMINOPHEN 10-325 MG PO TABS: 1.0000 | ORAL_TABLET | ORAL | 0 refills | Status: DC | PRN

## 2024-02-08 NOTE — Progress Notes (Signed)
 [facility] OFFICE PROGRESS NOTE  Patient Care Team: [name], MD as PCP - General (Internal Medicine) Luana Rumple, MD as PCP - Cardiology (Cardiology) Millicent Ally, MD as PCP - Sleep Medicine (Cardiology) Katheleen Palmer, RN as Oncology Nurse Navigator  [name] is a very pleasant 68 year old male with history of hypertension, hyperlipidemia, COPD, prostate cancer being follow-up for prostate cancer.  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    Small volume progression after recent radiation. completed sipuleucel-T .  Tolerating well so far.  May proceed weekly, remove central line after third infusion.  His cytology came back and raised the concerns for mesothelioma.  Recommend FNA with pathology.  To be certain, additional tissue based biopsy will be needed.  Clinically, he has been declining.  He is now on 5 L of oxygen mostly wheelchair bound.  He is not able to perform much activity.  His performance status has declined.  We discussed my concern of going through a biopsy.  Unfortunately, his new malignancy is quickly progressive and he has limited performance status to the point consider a procedure.  We discussed combination immunotherapy may be effective in some but expect progression based on studies.  Side effect can be significant with combination immunotherapy as well.  Given he is decreased PS, PS 4, consideration for hospice as also reasonable.  He and his wife were open to this plan.  She is understandably sad and tearful.  We discussed hospice to focus on quality of life, comfort measures at home, and other supportive measures.  He is receptive to this plan.  He will follow-up with palliative care team after the visit today.  I will communicate with radiology to cancel Pluvicto.  Follow-up with hospice care.   Lowanda Ruddy, MD  INTERVAL HISTORY: Patient returns for follow-up.  Additional addendum from cytology showed:  Additional immunostains show that the tumor  cells are positive for calretinin while they are negative for p63 and CK5/6.  Diffuse staining of malignant cells for calretinin is very suggestive of mesothelioma. Clinical-radiologic correlation is required.    Clinically, he has worsening performance status.  He is mostly chair bound.  Not able to perform much task at home.  He is using 5 L of oxygen.  Shortness of breath persists.  There is decreased appetite.  He is not much activity.  Oncology History  Prostate cancer metastatic to bone [facility])  2000 Initial Diagnosis   Initial diagnosis in [place], [place]   11/2005 Surgery   Robotic radical prostatectomy by Dr. Kristeen Peto. pT3a N0.  Positive margin Gleason 3+4 = 7 adenocarcinoma   07/2008 Progression   PSA 0.11. salvage radiation.  Completed in May 2010. (Dr. Lorri Rota)   2014 Progression   Found in the appendiceal mass undergoing laparoscopy by general surgery.  Pathology showed solitary intraperitoneal metastases from prostate cancer   07/2013 - 04/2017 Chemotherapy   Started intermittent ADT   02/05/2018 Tumor Marker   PSA 3.77   05/19/2018 Tumor Marker   PSA 5.26   08/23/2018 Tumor Marker   PSA 6.22   08/2018 Progression   PSA increased to 6.22 eventually. CT showed multiple sclerotic bone lesions in the spine, sacrum, pelvis and bone scan with uptake in the right ischium and multiple ribs.   08/2018 -  Chemotherapy   He started enzalutamide . PSA reached nadir <0.2.   03/15/2020 Imaging   Bone scan 1. Resolution of focal radiotracer activity in the right anterior fourth rib and left sacral ala since  prior study. 2. Persistent asymmetric activity involving the distal clavicle right greater than left, nonspecific. While metastatic disease cannot be excluded, degenerative changes could also give this pattern. 3. Degenerative type activity of the bilateral glenohumeral joints, cervical spine, and lumbar spine   04/05/2021 Tumor Marker   PSA 0.11   07/17/2021 Tumor Marker    PSA 0.19 Testosterone  13.3   2023 Progression   Rising PSA   09/20/2021 Tumor Marker   PSA 0.26 Testosterone  20.9   12/16/2021 Tumor Marker   PSA 0.57 Testosterone  25.3   03/26/2022 Tumor Marker   PSA 1.17 Testosterone  14.1   06/2022 Imaging   PSMA PET: Positive abdominal lymph nodes mildly improved compared to prior conventional imaging 2020.   06/25/2022 Tumor Marker   PSA 1.36 Testosterone  13.9   10/01/2022 Tumor Marker   PSA 3.7 Testosterone  16.1   01/05/2023 Tumor Marker   PSA 4.2 Testosterone  17.6   03/2023 Tumor Marker   PSA 7.18   04/29/2023 Cancer Staging   Staging form: Prostate, AJCC 8th Edition - Clinical: Stage IVB (cT3a, cN0, cM1b, Grade Group: 2) - Signed by Lowanda Ruddy, MD on 04/29/2023 Gleason score: 7 Histologic grading system: 5 grade system    Genetic Testing   Ambry testing. Negative for HRR mutation Tempus Liquid: Negative for HRR mutation   05/12/2023 Imaging   PSMA PET A high left posterior mediastinal node measures 7 mm and a S.U.V. max of 3.6 on 54/4. This measured 5 mm and was not tracer avid previously. A subcarinal node measures 8 mm and a S.U.V. max of 3.4 on 83/3. Compare 7 mm and not tracer avid on the prior. Lymph nodes: Porta hepatis nodes of up to 1.7 cm and a S.U.V. max of 36.6 on 114/4. Compare 9 mm and a S.U.V. max of 21.4 on the prior. Left periaortic node measures 1.3 cm and a S.U.V. max of 35.3 on 134/4 versus 6 mm and a S.U.V. max of 4.5 on the prior. New tracer avid abdominal retroperitoneal node in the preaortic space at 4 mm and a S.U.V. max of 6.7 on 143/4.   Liver: No evidence of liver metastasis.  No focal activity to suggest skeletal metastasis. Lumbar spine fixation. Cervical spine fixation.   06/22/2023 - 07/03/2023 Radiation Therapy   ultrahypofractionated radiotherapy (UHRT) mediastinal and intraabdominal LNs   07/29/2023 -  Chemotherapy   Eligard  22.5 mg  PSA 15.8   09/07/2023 PET scan   PSMA PET 1. Stable  intensely radiotracer avid retroperitoneal and periportal lymph nodes consistent with prostate carcinoma nodal metastasis. No interval change. 2. No evidence of local recurrence in the prostatectomy bed. 3. No evidence of visceral metastasis. 4. Single new small focus of radiotracer activity in the RIGHT acetabulum is concerning oligometastatic skeletal metastasis.   10/22/2023 -  Chemotherapy   Patient is on Treatment Plan : PROSTATE Sipuleucel-T  q14d        PHYSICAL EXAMINATION: ECOG PERFORMANCE STATUS: 4 - Bedbound  Vitals:   02/08/24 1146  BP: 98/72  Pulse: (!) 150  Resp: 19  Temp: 97.9 F (36.6 C)  SpO2: 98%   Filed Weights   02/08/24 1146  Weight: 237 lb (107.5 kg)   GENERAL: alert, no distress; on wheelchair SKIN: skin color pale  EYES:  sclera clear LUNGS: on oxygen with normal breathing effort   Relevant data reviewed during this visit included pathology and imaging, labs.

## 2024-02-08 NOTE — Progress Notes (Signed)
 Palliative Medicine [facility]  [phone] [phone]   Name: [name] Date: 02/08/2024 MRN: [id number]  DOB: 1956-03-30  Patient Care Team: [name], MD as PCP - General (Internal Medicine) Luana Rumple, MD as PCP - Cardiology (Cardiology) Millicent Ally, MD as PCP - Sleep Medicine (Cardiology) Katheleen Palmer, RN as Oncology Nurse Navigator    REASON FOR CONSULTATION: Michael PAOLINI is a 68 y.o. male with oncologic medical history including prostate cancer, COPD, hypertension, and hyperlipidemia. Recent cytology demonstrated concerns for mesothelioma. Palliative is seeing patient for symptom management and goals of care.    SOCIAL HISTORY:     reports that he quit smoking about 10 years ago. His smoking use included cigarettes. He started smoking about 53 years ago. He has a 10.8 pack-year smoking history. He quit smokeless tobacco use about 43 years ago. He reports that he does not drink alcohol and does not use drugs.  ADVANCE DIRECTIVES:  None on file. DNR/MOST form completed today.   CODE STATUS: DNR  PAST MEDICAL HISTORY: Past Medical History:  Diagnosis Date   Anemia    Anxiety    Arthritis    Bladder stone    Cancer (HCC) 2007   prostate CA   COPD (chronic obstructive pulmonary disease) (HCC)    Decreased pulse 08/16/2008   LE doppler - bilateral ABIs normal, bilateral PVRs normal waveform; bilateral common iliac arteries difficult to visualize however velocities suggest less than 50% diameter reduction; bilateral LE normal velocities w/o evidence of diameter reduction   Depression    GERD (gastroesophageal reflux disease)    Hypercholesteremia    Hypertension    Mental disorder    PTSD   Pre-diabetes    Shortness of breath    Sleep apnea    wears CPAP nightly, settings at 14    Tobacco abuse    Urinary incontinence    due to prostate removal    PAST SURGICAL HISTORY:  Past Surgical History:   Procedure Laterality Date   ANTERIOR LAT LUMBAR FUSION N/A 11/17/2012   Procedure: XLIF L3 - L4 POSTERIOR L3 -L4 FUSION INSTRUMENTATION/L2 - L3 DECOMPRESSION (REVISION OF POSTERIOR HARDWARE) 1 LEVEL;  Surgeon: Mort Ards, MD;  Location: [facility] OR;  Service: Orthopedics;  Laterality: N/A;  procedure #1: start 0923, end 1057.    BACK SURGERY  2010 / 2014   BLADDER SUSPENSION     x 2   CARDIAC CATHETERIZATION     no PCI   CARDIOVASCULAR STRESS TEST  08/16/2008   EF 57%; inferior region has diaphragmatic attenuation, otherwise normal perfusion w/o evidence of ischemia or infarct; LV normal in size; no scintographic evidence of inducible ischemia; no ECG changes, negative for ischemia; low risk scan   CERVICAL FUSION     CHEST TUBE INSERTION Left 01/15/2024   Procedure: CHEST TUBE INSERTION;  Surgeon: Denson Flake, MD;  Location: [facility] ENDOSCOPY;  Service: Cardiopulmonary;  Laterality: Left;   COLON RESECTION N/A 07/12/2013   peritoneal biopsy only, evidence of metastatic prostate cancer   COLONOSCOPY     COLONOSCOPY N/A 05/27/2013   RMR: Extrinsic mass effect at the level of the appendiceal orifice likely represents an appendiceal or periappendiceal process. Neovascular changes of the rectal mucosa consistent with prior history of radiation treatment. Multiple 3-5 mm polyps removed, several tubular adenomas. Diverticulosis.nex tcs 2019   COLONOSCOPY WITH PROPOFOL  N/A 07/05/2018   Procedure: COLONOSCOPY WITH PROPOFOL ;  Surgeon: Suzette Espy, MD;  Location:  [facility];  Service: Endoscopy;  Laterality: N/A;  9:30am   COLONOSCOPY WITH PROPOFOL  N/A 03/07/2021   Procedure: COLONOSCOPY WITH PROPOFOL ;  Surgeon: Suzette Espy, MD;  Location: [facility];  Service: Endoscopy;  Laterality: N/A;  2:30pm   CYSTOSCOPY W/ URETERAL STENT PLACEMENT N/A 10/03/2013   Procedure: CYSTOSCOPY AND REMOVAL OF BLADDER NECK STONE ;  Surgeon: Kristeen Peto, MD;  Location: [facility];  Service: Urology;  Laterality: N/A;   WITH REMOVAL OF BLADDER NECK STONE     ESOPHAGOGASTRODUODENOSCOPY (EGD) WITH ESOPHAGEAL DILATION N/A 05/27/2013   RMR: Mild erosive reflux esophagitis with soft noncritical appearing stricture status post Maloney dilation, antral erosions with reactive gastropathy but no H. pylori   ESOPHAGOGASTRODUODENOSCOPY (EGD) WITH PROPOFOL  N/A 07/05/2018   Procedure: ESOPHAGOGASTRODUODENOSCOPY (EGD) WITH PROPOFOL ;  Surgeon: Suzette Espy, MD;  [facility];  Service: Endoscopy;  Laterality: N/A;   ESOPHAGOGASTRODUODENOSCOPY (EGD) WITH PROPOFOL  N/A 02/05/2023   Procedure: ESOPHAGOGASTRODUODENOSCOPY (EGD) WITH PROPOFOL ;  Surgeon: Suzette Espy, MD;  [facility]: [facility];  Service: Endoscopy;  Laterality: N/A;  11:30 am, asa 3   HOLMIUM LASER APPLICATION N/A 10/03/2013   Procedure: HOLMIUM LASER APPLICATION;  Surgeon: Kristeen Peto, MD;  Location: [facility];  Service: Urology;  Laterality: N/A;   IR FLUORO GUIDE CV LINE RIGHT  10/08/2023   IR REMOVAL TUN CV CATH W/O FL  11/13/2023   IR US  GUIDE VASC ACCESS RIGHT  10/08/2023   MALONEY DILATION N/A 07/05/2018   Procedure: Londa Rival DILATION;  Surgeon: Suzette Espy, MD;  [facility];  Service: Endoscopy;  Laterality: N/A;   MALONEY DILATION N/A 02/05/2023   Procedure: Londa Rival DILATION;  Surgeon: Suzette Espy, MD;  [facility]: [facility];  Service: Endoscopy;  Laterality: N/A;   POLYPECTOMY  07/05/2018   Procedure: POLYPECTOMY;  Surgeon: Suzette Espy, MD;  [facility];  Service: Endoscopy;;  cecal polyp , splenic flexure polyps x4   POLYPECTOMY  03/07/2021   Procedure: POLYPECTOMY;  Surgeon: Suzette Espy, MD;  [facility]: [facility];  Service: Endoscopy;;   PROSTATE SURGERY     TRANSTHORACIC ECHOCARDIOGRAM  08/16/2008   essentially normal for age   URINARY SPHINCTER IMPLANT N/A 01/31/2014   Procedure: IMPLANTATION ARTIFICIAL SPHINCTER  WITH CYSTOSCOPY;  Surgeon: Devorah Fonder, MD;  Location: [facility];  Service: Urology;   Laterality: N/A;   WRIST SURGERY Right     HEMATOLOGY/ONCOLOGY HISTORY:  Oncology History  Prostate cancer metastatic to bone [facility])  2000 Initial Diagnosis   Initial diagnosis in [place], [place]   11/2005 Surgery   Robotic radical prostatectomy by Dr. Kristeen Peto. pT3a N0.  Positive margin Gleason 3+4 = 7 adenocarcinoma   07/2008 Progression   PSA 0.11. salvage radiation.  Completed in May 2010. (Dr. Lorri Rota)   2014 Progression   Found in the appendiceal mass undergoing laparoscopy by general surgery.  Pathology showed solitary intraperitoneal metastases from prostate cancer   07/2013 - 04/2017 Chemotherapy   Started intermittent ADT   02/05/2018 Tumor Marker   PSA 3.77   05/19/2018 Tumor Marker   PSA 5.26   08/23/2018 Tumor Marker   PSA 6.22   08/2018 Progression   PSA increased to 6.22 eventually. CT showed multiple sclerotic bone lesions in the spine, sacrum, pelvis and bone scan with uptake in the right ischium and multiple ribs.   08/2018 -  Chemotherapy   He started enzalutamide . PSA reached nadir <0.2.   03/15/2020 Imaging  Bone scan 1. Resolution of focal radiotracer activity in the right anterior fourth rib and left sacral ala since prior study. 2. Persistent asymmetric activity involving the distal clavicle right greater than left, nonspecific. While metastatic disease cannot be excluded, degenerative changes could also give this pattern. 3. Degenerative type activity of the bilateral glenohumeral joints, cervical spine, and lumbar spine   04/05/2021 Tumor Marker   PSA 0.11   07/17/2021 Tumor Marker   PSA 0.19 Testosterone  13.3   2023 Progression   Rising PSA   09/20/2021 Tumor Marker   PSA 0.26 Testosterone  20.9   12/16/2021 Tumor Marker   PSA 0.57 Testosterone  25.3   03/26/2022 Tumor Marker   PSA 1.17 Testosterone  14.1   06/2022 Imaging   PSMA PET: Positive abdominal lymph nodes mildly improved compared to prior conventional imaging 2020.    06/25/2022 Tumor Marker   PSA 1.36 Testosterone  13.9   10/01/2022 Tumor Marker   PSA 3.7 Testosterone  16.1   01/05/2023 Tumor Marker   PSA 4.2 Testosterone  17.6   03/2023 Tumor Marker   PSA 7.18   04/29/2023 Cancer Staging   Staging form: Prostate, AJCC 8th Edition - Clinical: Stage IVB (cT3a, cN0, cM1b, Grade Group: 2) - Signed by Lowanda Ruddy, MD on 04/29/2023 Gleason score: 7 Histologic grading system: 5 grade system    Genetic Testing   Ambry testing. Negative for HRR mutation Tempus Liquid: Negative for HRR mutation   05/12/2023 Imaging   PSMA PET A high left posterior mediastinal node measures 7 mm and a S.U.V. max of 3.6 on 54/4. This measured 5 mm and was not tracer avid previously. A subcarinal node measures 8 mm and a S.U.V. max of 3.4 on 83/3. Compare 7 mm and not tracer avid on the prior. Lymph nodes: Porta hepatis nodes of up to 1.7 cm and a S.U.V. max of 36.6 on 114/4. Compare 9 mm and a S.U.V. max of 21.4 on the prior. Left periaortic node measures 1.3 cm and a S.U.V. max of 35.3 on 134/4 versus 6 mm and a S.U.V. max of 4.5 on the prior. New tracer avid abdominal retroperitoneal node in the preaortic space at 4 mm and a S.U.V. max of 6.7 on 143/4.   Liver: No evidence of liver metastasis.  No focal activity to suggest skeletal metastasis. Lumbar spine fixation. Cervical spine fixation.   06/22/2023 - 07/03/2023 Radiation Therapy   ultrahypofractionated radiotherapy (UHRT) mediastinal and intraabdominal LNs   07/29/2023 -  Chemotherapy   Eligard  22.5 mg  PSA 15.8   09/07/2023 PET scan   PSMA PET 1. Stable intensely radiotracer avid retroperitoneal and periportal lymph nodes consistent with prostate carcinoma nodal metastasis. No interval change. 2. No evidence of local recurrence in the prostatectomy bed. 3. No evidence of visceral metastasis. 4. Single new small focus of radiotracer activity in the RIGHT acetabulum is concerning oligometastatic skeletal  metastasis.   10/22/2023 -  Chemotherapy   Patient is on Treatment Plan : PROSTATE Sipuleucel-T  [id number]       ALLERGIES:  is allergic to penicillins.  MEDICATIONS:  Current Outpatient Medications  Medication Sig Dispense Refill   HYDROcodone -acetaminophen  (NORCO) 10-325 MG tablet Take 1 tablet by mouth every 4 (four) hours as needed. 90 tablet 0   albuterol  (PROVENTIL ) (2.5 MG/3ML) 0.083% nebulizer solution Take 3 mLs (2.5 mg total) by nebulization every 2 (two) hours as needed for wheezing. 75 mL 12   apixaban  (ELIQUIS ) 2.5 MG TABS tablet Take 1 tablet (2.5 mg total) by mouth  2 (two) times daily. 60 tablet 0   ARIPiprazole  (ABILIFY ) 10 MG tablet Take 10 mg by mouth at bedtime.     Choline Fenofibrate  135 MG capsule Take 135 mg by mouth at bedtime. trilipix     Coenzyme Q10 (CO Q 10) 100 MG CAPS Take 100 mg by mouth every evening.     enzalutamide  (XTANDI ) 40 MG capsule Take 4 capsules (160 mg total) by mouth daily. Takes 4 capsules daily 120 capsule 11   famotidine  (PEPCID ) 20 MG tablet One after supper (Patient taking differently: Take 20 mg by mouth daily with supper. One after supper) 30 tablet 11   feeding supplement (ENSURE ENLIVE / ENSURE PLUS) LIQD Take 237 mLs by mouth 2 (two) times daily between meals. 237 mL 12   ferrous sulfate  325 (65 FE) MG tablet Take 325 mg by mouth daily.   0   furosemide  (LASIX ) 20 MG tablet Take 20 mg by mouth daily.     hydroxychloroquine (PLAQUENIL) 200 MG tablet Take 200 mg by mouth 2 (two) times daily.     Multiple Vitamin (MULTIVITAMIN WITH MINERALS) TABS tablet Take 1 tablet by mouth daily.     pantoprazole  (PROTONIX ) 40 MG tablet TAKE 1 TABLET DAILY 30 MINUTES BEFORE BREAKFAST (Patient taking differently: Take 40 mg by mouth daily before breakfast.) 90 tablet 3   rosuvastatin  (CRESTOR ) 40 MG tablet TAKE 1 TABLET DAILY (Patient taking differently: Take 40 mg by mouth every evening.) 90 tablet 3   venlafaxine XR (EFFEXOR-XR) 75 MG 24 hr capsule Take 225  mg by mouth daily with breakfast.  0   No current facility-administered medications for this visit.    VITAL SIGNS: There were no vitals taken for this visit. There were no vitals filed for this visit.  Estimated body mass index is 30.43 kg/m as calculated from the following:   Height as of an earlier encounter on 02/08/24: 6' 2 (1.88 m).   Weight as of an earlier encounter on 02/08/24: 237 lb (107.5 kg).  LABS: CBC:    Component Value Date/Time   WBC 12.5 (H) 02/05/2024 1055   WBC 12.8 (H) 01/21/2024 0402   HGB 8.5 (L) 02/05/2024 1055   HGB 10.4 (L) 12/23/2023 1515   HCT 26.4 (L) 02/05/2024 1055   HCT 31.6 (L) 12/23/2023 1515   PLT 355 02/05/2024 1055   PLT 394 12/23/2023 1515   MCV 91.0 02/05/2024 1055   MCV 97 12/23/2023 1515   NEUTROABS 10.3 (H) 02/05/2024 1055   NEUTROABS 6.1 12/23/2023 1515   LYMPHSABS 0.5 (L) 02/05/2024 1055   LYMPHSABS 0.8 12/23/2023 1515   MONOABS 1.5 (H) 02/05/2024 1055   EOSABS 0.1 02/05/2024 1055   EOSABS 0.5 (H) 12/23/2023 1515   BASOSABS 0.0 02/05/2024 1055   BASOSABS 0.0 12/23/2023 1515   Comprehensive Metabolic Panel:    Component Value Date/Time   NA 132 (L) 01/28/2024 1423   NA 133 (L) 12/23/2023 1515   K 3.8 01/28/2024 1423   CL 95 (L) 01/28/2024 1423   CO2 32 01/28/2024 1423   BUN 11 01/28/2024 1423   BUN 20 12/23/2023 1515   CREATININE 0.60 (L) 01/28/2024 1423   GLUCOSE 126 (H) 01/28/2024 1423   CALCIUM  9.1 01/28/2024 1423   AST 20 01/28/2024 1423   ALT 16 01/28/2024 1423   ALKPHOS 80 01/28/2024 1423   BILITOT 0.4 01/28/2024 1423   PROT 6.9 01/28/2024 1423   PROT 7.3 06/08/2019 0823   ALBUMIN 3.2 (L) 01/28/2024 1423  ALBUMIN 4.6 06/08/2019 1610    RADIOGRAPHIC STUDIES: NM Radiologist Eval And Mgmt Result Date: 01/28/2024 EXAM: NEW PATIENT OFFICE VISIT CHIEF COMPLAINT: Metastatic castrate resistant prostate adenocarcinoma. Evaluation for Lu 177 PSMA therapy. Current Pain Level: 1-10 HISTORY OF PRESENT ILLNESS: With  metastatic castrate resistant prostate carcinoma. Initial diagnosis in 2007 with subsequent prostatectomy. Radiation salvage therapy 2010. Current androgen deprivation therapy ongoing. Palliative radiation therapy 2 oligo progression lymph nodes in the mediastinum abdomen in October 2024. Evidence of progression within several sites within the skeleton as well as malignant pleural effusion on PSMA PET scan (12/31/2023). Recent hospitalization for LEFT pulmonary infection presumed superimposed on LEFT pulmonary malignancy. PSA 6.8 and mildly rising. Patient anemic require recent transfusion. REVIEW OF SYSTEMS: See epic note PHYSICAL EXAMINATION: See epic note ASSESSMENT AND PLAN: See epic note Electronically Signed   By: Deboraha Fallow M.D.   On: 01/28/2024 15:39   CT CHEST W CONTRAST Result Date: 01/19/2024 CLINICAL DATA:  Pleural effusion, known or suspected (Ped 0-17y) empyema vs hemothorax left pleural space. EXAM: CT CHEST WITH CONTRAST TECHNIQUE: Multidetector CT imaging of the chest was performed during intravenous contrast administration. RADIATION DOSE REDUCTION: This exam was performed according to the departmental dose-optimization program which includes automated exposure control, adjustment of the mA and/or kV according to patient size and/or use of iterative reconstruction technique. CONTRAST:  75mL OMNIPAQUE  IOHEXOL  350 MG/ML SOLN COMPARISON:  Noncontrast chest CT 2 days ago, additional prior exams reviewed including PET CT 12/31/2023 FINDINGS: Cardiovascular: The heart is normal in size. Small pericardial fluid. Aortic atherosclerosis without acute aortic findings. There is no central pulmonary embolus on this exam not tailored to pulmonary artery CIS mint. Mediastinum/Nodes: Stable mediastinal and left hilar adenopathy. Representative highest mediastinal node measures 11 mm series 2, image 40. patulous esophagus. Lungs/Pleura: Small partially loculated pleural effusion. There is no significant  pleural enhancement. Fluid appears to be simple in density. Small amount of fluid tracks into the inter lobar fissure. No previous pleural catheter has been removed. Left upper lobe paramediastinal consolidation and ground-glass, without significant change from recent CT, but mild progression from PET earlier this month. There is ground-glass and septal thickening within the left lower lobe. Emphysema with areas of subpleural reticulation in both lungs. There is no right pleural effusion Upper Abdomen: No acute upper abdominal findings. Musculoskeletal: No acute findings. Degenerative change in the spine. IMPRESSION: 1. Small partially loculated left pleural effusion, not significantly changed in size after removal of pleural catheter. Fluid appears to be simple in density. No convincing pleural enhancement. Recommend correlation with fluid sampling. 2. Left upper lobe paramediastinal consolidation and ground-glass, without significant change from CT 2 days ago, but mild progression from PET earlier this month. Differential considerations remain infection, neoplasm or drug reaction. 3. Stable mediastinal and left hilar adenopathy. Aortic Atherosclerosis (ICD10-I70.0) and Emphysema (ICD10-J43.9). Electronically Signed   By: Chadwick Colonel M.D.   On: 01/19/2024 19:52   DG CHEST PORT 1 VIEW Result Date: 01/19/2024 CLINICAL DATA:  Hemothorax EXAM: PORTABLE CHEST 1 VIEW COMPARISON:  01/17/2024 FINDINGS: Continued left perihilar and basilar airspace opacity with blunting of the left costophrenic angle favoring pleural effusion. The left pleural drainage catheter is no longer visible. There is new pleural based density at the left lung apex probably representing a loculated component of pleural effusion. Low lung volumes are present, causing crowding of the pulmonary vasculature. Mild atelectasis or scarring at the right lung base. Obscured left heart border. Degenerative glenohumeral arthropathy on the left.  IMPRESSION: 1. Continued left perihilar and basilar airspace opacity with blunting of the left costophrenic angle favoring pleural effusion. 2. New pleural based density at the left lung apex probably representing a loculated component of pleural effusion. 3. Low lung volumes are present, causing crowding of the pulmonary vasculature. 4. Mild atelectasis or scarring at the right lung base. Electronically Signed   By: Freida Jes M.D.   On: 01/19/2024 17:46   CT CHEST WO CONTRAST Result Date: 01/18/2024 CLINICAL DATA:  Pleural effusion, new no suspected, chest 2 EXAM: CT CHEST WITHOUT CONTRAST TECHNIQUE: Multidetector CT imaging of the chest was performed following the standard protocol without IV contrast. RADIATION DOSE REDUCTION: This exam was performed according to the departmental dose-optimization program which includes automated exposure control, adjustment of the mA and/or kV according to patient size and/or use of iterative reconstruction technique. COMPARISON:  Three days ago FINDINGS: Cardiovascular: Normal heart sounds. Small volume pericardial fluid which is similar to prior. No acute vascular finding. Scattered atheromatous calcification. Mediastinum/Nodes: Unchanged moderate enlargement of lymph nodes in the prevascular and central mediastinum. Unchanged reticulation of mediastinal fat the lower heart. Lungs/Pleura: Significant decrease in left pleural effusion with mild residual seen the lower major fissure. There is question of nodular thickening along the left pleura, but varies measuring water  density with no activity on recent PET CT in these areas, can correlate with pleural fluid labs. Small bore catheter is in good position at the left posterior pleural cavity. Reticulation in the bilateral lungs (patchy ground-glass opacity on recent imaging) and paramediastinal consolidative type opacity in the left upper lobe. There is a degree chronic lung disease with paraseptal emphysema,  although fairly well aerated lungs at PET CT 09/07/2023. Upper Abdomen: Negative Musculoskeletal: No acute finding IMPRESSION: Nearly resolved left pleural effusion with chest tube remaining in place at the lateral left base. Nodular appearance along the left pleural surface has no correlate on recent PET CT, correlate with pleural fluid labs. No progression pulmonary opacity and mediastinal adenopathy-infection, malignancy, and drug reaction differential considerations based on recent CTs. Electronically Signed   By: Ronnette Coke M.D.   On: 01/18/2024 04:13   DG Chest Port 1 View Result Date: 01/17/2024 CLINICAL DATA:  Acute respiratory failure. EXAM: PORTABLE CHEST 1 VIEW COMPARISON:  01/16/2024 FINDINGS: There is a left-sided pigtail drainage catheter overlying the left lower lung. The left-sided pleural fluid collection is decreased from previous exam. Chronic, bilateral reticular interstitial opacities with a lower lung zone predominance. No significant pneumothorax identified. IMPRESSION: 1. Left-sided pigtail drainage catheter overlying the left lower lung. Decreased left pleural fluid collection. No pneumothorax. 2. Chronic interstitial changes as noted previously. Electronically Signed   By: Kimberley Penman M.D.   On: 01/17/2024 08:49   DG Chest Port 1 View Result Date: 01/16/2024 CLINICAL DATA:  Acute respiratory distress EXAM: PORTABLE CHEST 1 VIEW COMPARISON:  X-ray earlier 01/16/2024 and older. FINDINGS: Film is rotated to left. Left basilar pigtail catheter is again seen and has slightly retracted compared to the previous x-ray. Persistent left-sided effusion adjacent lung opacity. Masslike fullness of the left lung hilum again seen. Please correlate with prior CTA of the chest. Stable cardiopericardial silhouette. Underinflation with some right basilar opacity again seen. No pneumothorax. Overlapping cardiac leads. Fixation hardware along the lower cervical spine. IMPRESSION: Interval  retraction of the pigtail catheter at the left lung base. No pneumothorax. No significant oval change when adjusted for technique otherwise. Electronically Signed   By: Otho Blitz.D.  On: 01/16/2024 19:55   DG Chest Port 1 View Result Date: 01/16/2024 CLINICAL DATA:  Chest tube EXAM: PORTABLE CHEST 1 VIEW COMPARISON:  Chest x-ray performed Jan 15, 2024 FINDINGS: A left basilar thoracostomy is present. Persistent opacification of the left lung base. Mild diffuse interstitial prominence. Heart mediastinum are not significantly changed. No pneumothorax. IMPRESSION: 1. Left-sided thoracostomy tube without evidence of pneumothorax. 2. When compared to the prior exam, similar appearance of left basilar consolidation and pleural effusion. 3. No significant interval change. Electronically Signed   By: Reagan Camera M.D.   On: 01/16/2024 08:39   DG CHEST PORT 1 VIEW Result Date: 01/15/2024 CLINICAL DATA:  Left chest tube EXAM: PORTABLE CHEST 1 VIEW COMPARISON:  01/14/2024 FINDINGS: Single frontal view of the chest demonstrates a stable cardiac silhouette. Interval placement of a left chest tube, overlying the medial left lung base. Marked decrease in the multilocular left pleural effusion seen previously. Persistent areas of consolidation are seen within the left perihilar region, most pronounced in the left upper lobe. Stable emphysema. No pneumothorax. No acute bony abnormalities. IMPRESSION: 1. Marked decrease in the multilocular left pleural effusion after left chest tube placement. 2. Persistent left perihilar consolidation, greatest in the left upper lobe, consistent with pneumonia or atelectasis. Electronically Signed   By: Bobbye Burrow M.D.   On: 01/15/2024 16:18   CT Angio Chest PE W/Cm &/Or Wo Cm Result Date: 01/14/2024 CLINICAL DATA:  Pulmonary embolism EXAM: CT ANGIOGRAPHY CHEST WITH CONTRAST TECHNIQUE: Multidetector CT imaging of the chest was performed using the standard protocol during bolus  administration of intravenous contrast. Multiplanar CT image reconstructions and MIPs were obtained to evaluate the vascular anatomy. Multiplanar image (3D post-processing) reconstructions and MIPs were obtained to evaluate the vascular anatomy. RADIATION DOSE REDUCTION: This exam was performed according to the departmental dose-optimization program which includes automated exposure control, adjustment of the mA and/or kV according to patient size and/or use of iterative reconstruction technique. CONTRAST:  75mL OMNIPAQUE  IOHEXOL  350 MG/ML SOLN COMPARISON:  CT of the chest performed December 01, 2023. PET-CT performed Dec 31, 2023 FINDINGS: Cardiovascular: Normal size heart. No pericardial effusion. Three vessel aortic arch. The main pulmonary artery is within normal limits for size. No evidence of pulmonary embolism to the proximal segmental level. Mediastinum/Nodes: Enlarged lymph nodes are present within the mediastinum. A representative lymph node anterior to the thoracic aorta measures 1.4 cm in short axis dimension on image 37 of series 4. Soft tissue thickening is also increased along the left medial pleural surface, left mediastinum and left hilum. Lungs/Pleura: There is a loculated left-sided pleural effusion. This is moderate in size and worst in the left lung base. Consolidative changes are present in the left lung particularly in the medial left upper lobe and left lower lobe. Underlying changes of chronic lung disease are present with cystic lucencies which are upper lobe and peripheral predominant. Peribronchial thickening is also noted. Upper Abdomen: No acute abnormality. Musculoskeletal: No chest wall abnormality. No acute or significant osseous findings. Review of the MIP images confirms the above findings. IMPRESSION: 1. Increase in abnormal soft tissue thickening along the medial pleural surface of the left lung. 2. Moderate loculated left pleural effusion. 3. Increasing mediastinal lymphadenopathy.  4. No evidence of pulmonary embolism to the proximal segmental level. 5. Scattered consolidative changes are also present in the left lung which may represent superimposed infection. Electronically Signed   By: Reagan Camera M.D.   On: 01/14/2024 16:56   DG Chest [facility]  1 View Result Date: 01/14/2024 CLINICAL DATA:  Shortness of breath, dizziness. EXAM: PORTABLE CHEST 1 VIEW COMPARISON:  December 01, 2023. FINDINGS: Stable cardiomediastinal silhouette. Stable minimal right basilar scarring or subsegmental atelectasis. Increased left basilar opacity is noted concerning for worsening atelectasis or pneumonia with small left pleural effusion. Bony thorax is unremarkable. IMPRESSION: Increased left basilar opacity is noted concerning for worsening atelectasis or pneumonia with small left pleural effusion. Electronically Signed   By: Rosalene Colon M.D.   On: 01/14/2024 13:56    PERFORMANCE STATUS (ECOG) : 2 - Symptomatic, <50% confined to bed  Review of Systems  Constitutional:  Positive for activity change, appetite change and fatigue.  Respiratory:  Positive for cough and shortness of breath.   Musculoskeletal:  Positive for arthralgias.  Unless otherwise noted, a complete review of systems is negative.  Physical Exam General: NAD Cardiovascular: regular rate and rhythm Pulmonary: diminished, dyspnea, 5L/[place] Abdomen: soft, nontender, + bowel sounds Extremities: no edema, no joint deformities Skin: no rashes Neurological: Alert and oriented x3  IMPRESSION: Discussed the use of AI scribe software for clinical note transcription with the patient, who gave verbal consent to proceed.  History of Present Illness [name] is a 68 year old male with worsening performance status in the setting of rapidly progressing mesothelioma. He is accompanied by his wife, Concha Deed.   I introduced myself, [place] RN, and Palliative's role in collaboration with the oncology team. Concept of Palliative Care was  introduced as specialized medical care for people and their families living with serious illness.  It focuses on providing relief from the symptoms and stress of a serious illness.  The goal is to improve quality of life for both the patient and the family. Values and goals of care important to patient and family were attempted to be elicited.   He has significant leg weakness and swelling, particularly in the right leg. Upon discharge from the hospital, his legs were extremely swollen, but the swelling has decreased according to his spouse. No issues with constipation or diarrhea, and he denies nausea or vomiting.  He is currently on 5 liters of oxygen and wishes to remain at home with hospice support. He experiences difficulty breathing, especially at night, despite using a CPAP machine with an oxygen adapter. His bed does not elevate, which may contribute to his discomfort, and he uses a recliner during the day to rest.  Mr. Choyce states he experiences lower back pain, rated as 4 out of 10 at its worst. He is taking hydrocodone  for pain management as needed, which he finds effective and does not feel the need for stronger medication. We discussed at length ongoing symptom management and needs in the future.   Goals of Care  We discussed his current illness and what it means in the larger context of his on-going co-morbidities. Natural disease trajectory and expectations were discussed. Mr. Dotzler and his wife are emotional expressing realistic understanding of cancer diagnosis and rapid decline. Emotional support provided.   Wife shares they were not expecting such a rapid decline and feels as though they have been hit by a ton of bricks. Support provided. Patient and wife acknowledges decrease and poor quality of life. Goals are clearly expressed to return home with hospice support allowing Mr. Lebeck to spend what time he has left amongst his family and friends.   Education provided on the goals,  philosophy of care, and what care would look like in the home with hospice  support. They verbalized understanding. Expressing need for wheelchair in the home. No other DME needs identified at this time.   I empathetically approached discussions regarding healthcare limitations, advanced directives, and code status. Mr. Kunath and wife confirms wishes for a natural death with no life sustaining measures. Education provided on DNR/DNI and what this would look like in the home for patient. Education provided on MOST form. The patient and family outlined their wishes for the following treatment decisions:  Cardiopulmonary Resuscitation: Do Not Attempt Resuscitation (DNR/No CPR)  Medical Interventions: Comfort Measures: Keep clean, warm, and dry. Use medication by any route, positioning, wound care, and other measures to relieve pain and suffering. Use oxygen, suction and manual treatment of airway obstruction as needed for comfort. Do not transfer to the hospital unless comfort needs cannot be met in current location.  Antibiotics: Determine use of limitation of antibiotics when infection occurs  IV Fluids: No IV fluids (provide other measures to ensure comfort)  Feeding Tube: No feeding tube     I discussed the importance of continued conversation with family and their medical providers regarding overall plan of care and treatment options, ensuring decisions are within the context of the patients values and GOCs. Assessment & Plan Established therapeutic relationship. Education provided on palliative's role in collaboration with their Oncology/Radiation team.  Oxygen dependence Requires 5L oxygen. Dyspnea persists despite CPAP with oxygen adapter. Non-elevating bed may worsen respiratory difficulty.  Sleep apnea with CPAP use Managed with CPAP. Dyspnea persists despite CPAP with oxygen adapter. Non-elevating bed may worsen respiratory difficulty.  Leg weakness and swelling Leg weakness and  swelling, more in right leg. Swelling decreased post-hospital discharge. Awaiting wheelchair and ramp discussion for home access.  Back pain Chronic lower back pain rated 4/10. Managed with hydrocodone . -Continue hydrocodone  7.5/325mg  every 4 hours as needed for pain management. Refill sent.   Goals of Care Desires home hospice support. Prefers natural death, no CPR or life support. Completed medical orders for scope of treatment, including DNR and no feeding tube. Open to antibiotics but declines IV fluids or surgical feeding tube. - Submit hospice referral to [facility] - Complete and submit medical orders for scope of treatment. - Ensure hospice team is aware of DNR status and care preferences. -DNR/MOST form completed and original given to patient.  -Education provided on home hospice. Patient clear in expressed wishes to be home for what time he has left.   Follow-up Awaiting hospice team contact for home visit scheduling. - Expect a call from hospice to set up initial visit.  Patient expressed understanding and was in agreement with this plan. He also understands that He can call the clinic at any time with any questions, concerns, or complaints.   Thank you for your referral and allowing Palliative to assist in Mr. Linard L Bensch's care.   Number and complexity of problems addressed: HIGH - 1 or more chronic illnesses with SEVERE exacerbation, progression, or side effects of treatment - advanced cancer, pain. Any controlled substances utilized were prescribed in the context of palliative care.  Visit consisted of counseling and education dealing with the complex and emotionally intense issues of symptom management and palliative care in the setting of serious and potentially life-threatening illness.  Signed by: Dellia Ferguson, AGPCNP-BC Palliative Medicine Team/[facility]

## 2024-02-08 NOTE — Progress Notes (Signed)
 Referral placed for [facility] hospice, contacted hospital liaison. No further needs at this time.

## 2024-02-12 NOTE — Telephone Encounter (Signed)
 Received a call today from [facility] in [place] that they received an order for this pt however, pt uses [facility] in [place].

## 2024-02-15 LAB — FUNGUS CULTURE WITH STAIN

## 2024-02-15 LAB — FUNGAL ORGANISM REFLEX

## 2024-02-15 LAB — FUNGUS CULTURE RESULT

## 2024-02-16 ENCOUNTER — Encounter (HOSPITAL_COMMUNITY)

## 2024-02-16 ENCOUNTER — Ambulatory Visit: Admitting: Cardiovascular Disease

## 2024-02-16 NOTE — Progress Notes (Signed)
 Patients wife, Mliss, called to inform that patient passed away on 03/08/24.   MD notified.  HIM notified.

## 2024-02-18 ENCOUNTER — Other Ambulatory Visit: Payer: Self-pay

## 2024-02-18 ENCOUNTER — Other Ambulatory Visit (HOSPITAL_COMMUNITY): Payer: Self-pay

## 2024-02-18 NOTE — Progress Notes (Signed)
 Spoke with patient's wife, [name]. [name] passed away on 03-17-2024 03/03/24. Disenrolling patient from [facility].

## 2024-02-22 ENCOUNTER — Ambulatory Visit: Admitting: Internal Medicine

## 2024-02-23 ENCOUNTER — Ambulatory Visit: Admitting: Internal Medicine

## 2024-02-23 DEATH — deceased

## 2024-03-03 NOTE — Telephone Encounter (Signed)
 Per his dme patient is deceased as of 03-03-24 and they were notified by the wife [name] on 6/24.

## 2024-04-04 ENCOUNTER — Ambulatory Visit

## 2024-04-04 ENCOUNTER — Other Ambulatory Visit

## 2024-04-04 ENCOUNTER — Encounter

## 2024-04-05 ENCOUNTER — Other Ambulatory Visit (HOSPITAL_COMMUNITY)

## 2024-05-17 ENCOUNTER — Other Ambulatory Visit (HOSPITAL_COMMUNITY)
